# Patient Record
Sex: Male | Born: 1935
Health system: Southern US, Community
[De-identification: ages and names within clinical notes are randomized; demographics above are authoritative.]

## PROBLEM LIST (undated history)

## (undated) DIAGNOSIS — R0600 Dyspnea, unspecified: Secondary | ICD-10-CM

## (undated) DIAGNOSIS — E785 Hyperlipidemia, unspecified: Secondary | ICD-10-CM

## (undated) DIAGNOSIS — M1990 Unspecified osteoarthritis, unspecified site: Secondary | ICD-10-CM

## (undated) DIAGNOSIS — G629 Polyneuropathy, unspecified: Secondary | ICD-10-CM

## (undated) DIAGNOSIS — I2510 Atherosclerotic heart disease of native coronary artery without angina pectoris: Secondary | ICD-10-CM

## (undated) DIAGNOSIS — E039 Hypothyroidism, unspecified: Secondary | ICD-10-CM

## (undated) DIAGNOSIS — K219 Gastro-esophageal reflux disease without esophagitis: Secondary | ICD-10-CM

## (undated) DIAGNOSIS — N200 Calculus of kidney: Secondary | ICD-10-CM

## (undated) DIAGNOSIS — K635 Polyp of colon: Secondary | ICD-10-CM

## (undated) DIAGNOSIS — I10 Essential (primary) hypertension: Secondary | ICD-10-CM

## (undated) DIAGNOSIS — I739 Peripheral vascular disease, unspecified: Secondary | ICD-10-CM

## (undated) HISTORY — DX: Hyperlipidemia, unspecified: E78.5

## (undated) HISTORY — DX: Calculus of kidney: N20.0

## (undated) HISTORY — DX: Atherosclerotic heart disease of native coronary artery without angina pectoris: I25.10

## (undated) HISTORY — DX: Polyneuropathy, unspecified: G62.9

## (undated) HISTORY — DX: Essential (primary) hypertension: I10

## (undated) HISTORY — DX: Polyp of colon: K63.5

---

## 1982-12-22 HISTORY — PX: APPENDECTOMY: SHX54

## 1987-12-23 HISTORY — PX: CHOLECYSTECTOMY: SHX55

## 2001-04-21 HISTORY — PX: CATARACT EXTRACTION: SUR2

## 2001-12-22 HISTORY — PX: RETINAL DETACHMENT SURGERY: SHX105

## 2004-11-11 ENCOUNTER — Ambulatory Visit: Payer: Self-pay | Admitting: Family Medicine

## 2004-12-13 ENCOUNTER — Ambulatory Visit: Payer: Self-pay | Admitting: Family Medicine

## 2004-12-22 HISTORY — PX: CORONARY ANGIOPLASTY: SHX604

## 2005-01-20 ENCOUNTER — Ambulatory Visit: Payer: Self-pay | Admitting: Family Medicine

## 2005-02-11 ENCOUNTER — Ambulatory Visit: Payer: Self-pay | Admitting: Family Medicine

## 2005-03-25 ENCOUNTER — Ambulatory Visit: Payer: Self-pay | Admitting: Family Medicine

## 2005-05-14 ENCOUNTER — Inpatient Hospital Stay: Payer: Self-pay | Admitting: Internal Medicine

## 2005-05-14 ENCOUNTER — Other Ambulatory Visit: Payer: Self-pay

## 2005-05-20 ENCOUNTER — Ambulatory Visit: Payer: Self-pay | Admitting: Family Medicine

## 2005-08-18 ENCOUNTER — Ambulatory Visit: Payer: Self-pay | Admitting: Family Medicine

## 2005-10-08 ENCOUNTER — Ambulatory Visit: Payer: Self-pay | Admitting: Family Medicine

## 2006-02-18 ENCOUNTER — Ambulatory Visit: Payer: Self-pay | Admitting: Family Medicine

## 2006-02-23 ENCOUNTER — Ambulatory Visit: Payer: Self-pay | Admitting: Family Medicine

## 2006-05-05 ENCOUNTER — Ambulatory Visit: Payer: Self-pay | Admitting: Family Medicine

## 2006-05-19 ENCOUNTER — Ambulatory Visit: Payer: Self-pay | Admitting: Family Medicine

## 2006-07-06 ENCOUNTER — Ambulatory Visit: Payer: Self-pay | Admitting: Family Medicine

## 2006-09-29 ENCOUNTER — Ambulatory Visit: Payer: Self-pay | Admitting: Family Medicine

## 2006-10-05 ENCOUNTER — Ambulatory Visit: Payer: Self-pay | Admitting: Family Medicine

## 2007-01-05 ENCOUNTER — Ambulatory Visit: Payer: Self-pay | Admitting: Family Medicine

## 2007-01-05 LAB — CONVERTED CEMR LAB
Albumin: 4 g/dL (ref 3.5–5.2)
BUN: 20 mg/dL (ref 6–23)
GFR calc Af Amer: 75 mL/min
GFR calc non Af Amer: 62 mL/min
Hgb A1c MFr Bld: 7.2 % — ABNORMAL HIGH (ref 4.6–6.0)
Phosphorus: 3.6 mg/dL (ref 2.3–4.6)
Potassium: 4.5 meq/L (ref 3.5–5.1)
Sodium: 141 meq/L (ref 135–145)

## 2007-01-20 ENCOUNTER — Ambulatory Visit: Payer: Self-pay | Admitting: Family Medicine

## 2007-04-15 ENCOUNTER — Encounter: Payer: Self-pay | Admitting: Family Medicine

## 2007-04-15 DIAGNOSIS — E1169 Type 2 diabetes mellitus with other specified complication: Secondary | ICD-10-CM | POA: Insufficient documentation

## 2007-04-15 DIAGNOSIS — E1122 Type 2 diabetes mellitus with diabetic chronic kidney disease: Secondary | ICD-10-CM

## 2007-04-15 DIAGNOSIS — L570 Actinic keratosis: Secondary | ICD-10-CM

## 2007-04-15 DIAGNOSIS — I252 Old myocardial infarction: Secondary | ICD-10-CM

## 2007-04-15 DIAGNOSIS — I10 Essential (primary) hypertension: Secondary | ICD-10-CM | POA: Insufficient documentation

## 2007-04-15 DIAGNOSIS — E785 Hyperlipidemia, unspecified: Secondary | ICD-10-CM | POA: Insufficient documentation

## 2007-04-18 DIAGNOSIS — I2510 Atherosclerotic heart disease of native coronary artery without angina pectoris: Secondary | ICD-10-CM | POA: Insufficient documentation

## 2007-07-01 ENCOUNTER — Ambulatory Visit: Payer: Self-pay | Admitting: Family Medicine

## 2007-07-02 LAB — CONVERTED CEMR LAB
Albumin: 3.6 g/dL (ref 3.5–5.2)
Creatinine, Ser: 1.1 mg/dL (ref 0.4–1.5)
GFR calc Af Amer: 83 mL/min
GFR calc non Af Amer: 69 mL/min
LDL Cholesterol: 52 mg/dL (ref 0–99)
Phosphorus: 2.5 mg/dL (ref 2.3–4.6)
Potassium: 4.7 meq/L (ref 3.5–5.1)
Sodium: 140 meq/L (ref 135–145)
Total CHOL/HDL Ratio: 2.7
Triglycerides: 55 mg/dL (ref 0–149)
VLDL: 11 mg/dL (ref 0–40)

## 2007-07-06 ENCOUNTER — Encounter: Payer: Self-pay | Admitting: Family Medicine

## 2007-07-27 ENCOUNTER — Telehealth (INDEPENDENT_AMBULATORY_CARE_PROVIDER_SITE_OTHER): Payer: Self-pay | Admitting: *Deleted

## 2007-08-06 ENCOUNTER — Encounter: Payer: Self-pay | Admitting: Family Medicine

## 2007-10-01 ENCOUNTER — Ambulatory Visit: Payer: Self-pay | Admitting: Family Medicine

## 2007-10-25 ENCOUNTER — Encounter: Payer: Self-pay | Admitting: Family Medicine

## 2007-12-02 ENCOUNTER — Encounter: Payer: Self-pay | Admitting: Family Medicine

## 2007-12-02 ENCOUNTER — Ambulatory Visit: Payer: Medicare Other | Admitting: Gastroenterology

## 2007-12-28 ENCOUNTER — Ambulatory Visit: Payer: Self-pay | Admitting: Family Medicine

## 2007-12-29 ENCOUNTER — Telehealth: Payer: Self-pay | Admitting: Family Medicine

## 2007-12-31 LAB — CONVERTED CEMR LAB
Albumin: 3.8 g/dL (ref 3.5–5.2)
Basophils Absolute: 0 10*3/uL (ref 0.0–0.1)
Calcium: 9.4 mg/dL (ref 8.4–10.5)
Chloride: 108 meq/L (ref 96–112)
Cholesterol: 104 mg/dL (ref 0–200)
GFR calc Af Amer: 83 mL/min
GFR calc non Af Amer: 69 mL/min
Glucose, Bld: 84 mg/dL (ref 70–99)
HCT: 37.2 % — ABNORMAL LOW (ref 39.0–52.0)
LDL Cholesterol: 49 mg/dL (ref 0–99)
MCHC: 34.5 g/dL (ref 30.0–36.0)
Microalb Creat Ratio: 36.6 mg/g — ABNORMAL HIGH (ref 0.0–30.0)
Microalb, Ur: 3.6 mg/dL — ABNORMAL HIGH (ref 0.0–1.9)
Neutrophils Relative %: 74.4 % (ref 43.0–77.0)
RBC: 3.7 M/uL — ABNORMAL LOW (ref 4.22–5.81)
RDW: 12.8 % (ref 11.5–14.6)
Total CHOL/HDL Ratio: 2.8
VLDL: 17 mg/dL (ref 0–40)

## 2008-03-01 ENCOUNTER — Ambulatory Visit: Payer: Self-pay | Admitting: Family Medicine

## 2008-03-03 ENCOUNTER — Ambulatory Visit: Payer: Self-pay | Admitting: Family Medicine

## 2008-03-29 ENCOUNTER — Ambulatory Visit: Payer: Self-pay | Admitting: Family Medicine

## 2008-04-03 LAB — CONVERTED CEMR LAB: Hgb A1c MFr Bld: 7.2 % — ABNORMAL HIGH (ref 4.6–6.0)

## 2008-04-10 ENCOUNTER — Encounter (INDEPENDENT_AMBULATORY_CARE_PROVIDER_SITE_OTHER): Payer: Self-pay | Admitting: *Deleted

## 2008-05-30 ENCOUNTER — Ambulatory Visit: Payer: Self-pay | Admitting: Family Medicine

## 2008-08-30 ENCOUNTER — Ambulatory Visit: Payer: Self-pay | Admitting: Family Medicine

## 2008-08-31 LAB — CONVERTED CEMR LAB
CO2: 30 meq/L (ref 19–32)
Chloride: 115 meq/L — ABNORMAL HIGH (ref 96–112)
GFR calc Af Amer: 75 mL/min
GFR calc non Af Amer: 62 mL/min
Potassium: 4.5 meq/L (ref 3.5–5.1)
Sodium: 145 meq/L (ref 135–145)

## 2008-09-04 ENCOUNTER — Ambulatory Visit: Payer: Self-pay | Admitting: Family Medicine

## 2008-10-02 ENCOUNTER — Ambulatory Visit: Payer: Self-pay | Admitting: Family Medicine

## 2008-10-18 ENCOUNTER — Encounter: Payer: Self-pay | Admitting: Family Medicine

## 2008-10-18 ENCOUNTER — Ambulatory Visit: Payer: Medicare Other | Admitting: Urology

## 2008-10-24 ENCOUNTER — Encounter: Payer: Self-pay | Admitting: Family Medicine

## 2008-10-24 ENCOUNTER — Ambulatory Visit: Payer: Medicare Other | Admitting: Urology

## 2008-11-14 ENCOUNTER — Telehealth: Payer: Self-pay | Admitting: Family Medicine

## 2008-12-05 ENCOUNTER — Ambulatory Visit: Payer: Self-pay | Admitting: Family Medicine

## 2008-12-06 LAB — CONVERTED CEMR LAB
ALT: 20 units/L (ref 0–53)
AST: 16 units/L (ref 0–37)
BUN: 22 mg/dL (ref 6–23)
CO2: 30 meq/L (ref 19–32)
Chloride: 108 meq/L (ref 96–112)
Cholesterol: 104 mg/dL (ref 0–200)
Creatinine, Ser: 1.1 mg/dL (ref 0.4–1.5)
GFR calc Af Amer: 83 mL/min
Hgb A1c MFr Bld: 6.6 % — ABNORMAL HIGH (ref 4.6–6.0)
Total CHOL/HDL Ratio: 2.9
Triglycerides: 79 mg/dL (ref 0–149)

## 2008-12-11 ENCOUNTER — Telehealth (INDEPENDENT_AMBULATORY_CARE_PROVIDER_SITE_OTHER): Payer: Self-pay | Admitting: *Deleted

## 2008-12-28 ENCOUNTER — Ambulatory Visit: Payer: Self-pay | Admitting: Family Medicine

## 2009-01-05 ENCOUNTER — Encounter: Payer: Self-pay | Admitting: Family Medicine

## 2009-05-28 ENCOUNTER — Encounter: Payer: Self-pay | Admitting: Family Medicine

## 2009-06-26 ENCOUNTER — Ambulatory Visit: Payer: Self-pay | Admitting: Family Medicine

## 2009-06-27 LAB — CONVERTED CEMR LAB
ALT: 21 units/L (ref 0–53)
Basophils Relative: 0.5 % (ref 0.0–3.0)
CO2: 31 meq/L (ref 19–32)
Calcium: 9.4 mg/dL (ref 8.4–10.5)
Chloride: 109 meq/L (ref 96–112)
Creatinine, Ser: 1.3 mg/dL (ref 0.4–1.5)
Creatinine,U: 78.1 mg/dL
Eosinophils Relative: 7.5 % — ABNORMAL HIGH (ref 0.0–5.0)
HCT: 38.1 % — ABNORMAL LOW (ref 39.0–52.0)
HDL: 41.2 mg/dL (ref >39.00)
Hemoglobin: 12.9 g/dL — ABNORMAL LOW (ref 13.0–17.0)
Lymphs Abs: 1.6 10*3/uL (ref 0.7–4.0)
Microalb Creat Ratio: 14.1 mg/g (ref 0.0–30.0)
Monocytes Relative: 7.7 % (ref 3.0–12.0)
Neutro Abs: 4 10*3/uL (ref 1.4–7.7)
RBC: 3.76 M/uL — ABNORMAL LOW (ref 4.22–5.81)
RDW: 12.3 % (ref 11.5–14.6)
Sodium: 146 meq/L — ABNORMAL HIGH (ref 135–145)
WBC: 6.6 10*3/uL (ref 4.5–10.5)

## 2009-06-29 LAB — CONVERTED CEMR LAB: Hgb A1c MFr Bld: 6.7 % — ABNORMAL HIGH (ref 4.6–6.5)

## 2009-07-02 ENCOUNTER — Ambulatory Visit: Payer: Self-pay | Admitting: Family Medicine

## 2009-07-24 ENCOUNTER — Telehealth: Payer: Self-pay | Admitting: Family Medicine

## 2009-07-27 ENCOUNTER — Telehealth: Payer: Self-pay | Admitting: Family Medicine

## 2009-07-31 ENCOUNTER — Encounter: Payer: Self-pay | Admitting: Family Medicine

## 2009-08-06 ENCOUNTER — Encounter: Payer: Self-pay | Admitting: Family Medicine

## 2009-08-31 ENCOUNTER — Encounter: Payer: Self-pay | Admitting: Family Medicine

## 2009-09-27 ENCOUNTER — Ambulatory Visit: Payer: Self-pay | Admitting: Family Medicine

## 2009-10-02 LAB — CONVERTED CEMR LAB
ALT: 17 units/L (ref 0–53)
AST: 15 units/L (ref 0–37)
HDL: 37 mg/dL — ABNORMAL LOW (ref >39.00)
LDL Cholesterol: 48 mg/dL (ref 0–99)
Total CHOL/HDL Ratio: 3
VLDL: 18.8 mg/dL (ref 0.0–40.0)

## 2009-11-22 ENCOUNTER — Encounter: Payer: Self-pay | Admitting: Family Medicine

## 2009-11-23 ENCOUNTER — Encounter: Payer: Self-pay | Admitting: Family Medicine

## 2010-01-04 ENCOUNTER — Ambulatory Visit: Payer: Self-pay | Admitting: Family Medicine

## 2010-01-18 ENCOUNTER — Encounter: Payer: Self-pay | Admitting: Family Medicine

## 2010-01-30 ENCOUNTER — Telehealth: Payer: Self-pay | Admitting: Family Medicine

## 2010-03-14 ENCOUNTER — Encounter: Payer: Self-pay | Admitting: Family Medicine

## 2010-05-21 ENCOUNTER — Telehealth: Payer: Self-pay | Admitting: Family Medicine

## 2010-06-18 ENCOUNTER — Encounter: Payer: Self-pay | Admitting: Family Medicine

## 2010-07-03 ENCOUNTER — Ambulatory Visit: Payer: Self-pay | Admitting: Family Medicine

## 2010-07-03 LAB — CONVERTED CEMR LAB
AST: 18 units/L (ref 0–37)
Chloride: 108 meq/L (ref 96–112)
Cholesterol: 120 mg/dL (ref 0–200)
GFR calc non Af Amer: 66.71 mL/min (ref >60)
Glucose, Bld: 176 mg/dL — ABNORMAL HIGH (ref 70–99)
Hgb A1c MFr Bld: 7.4 % — ABNORMAL HIGH (ref 4.6–6.5)
LDL Cholesterol: 65 mg/dL (ref 0–99)
Phosphorus: 2.7 mg/dL (ref 2.3–4.6)
Potassium: 4.8 meq/L (ref 3.5–5.1)
Sodium: 142 meq/L (ref 135–145)
Total CHOL/HDL Ratio: 3
VLDL: 14.8 mg/dL (ref 0.0–40.0)

## 2010-07-08 ENCOUNTER — Ambulatory Visit: Payer: Self-pay | Admitting: Family Medicine

## 2010-08-29 ENCOUNTER — Ambulatory Visit: Payer: Medicare Other | Admitting: Unknown Physician Specialty

## 2010-09-16 ENCOUNTER — Encounter: Payer: Self-pay | Admitting: Family Medicine

## 2010-09-16 LAB — HM DIABETES EYE EXAM: HM Diabetic Eye Exam: NORMAL

## 2010-10-04 ENCOUNTER — Ambulatory Visit: Payer: Self-pay | Admitting: Family Medicine

## 2010-10-06 LAB — CONVERTED CEMR LAB
Albumin: 4 g/dL (ref 3.5–5.2)
BUN: 22 mg/dL (ref 6–23)
Chloride: 108 meq/L (ref 96–112)
Creatinine, Ser: 1.2 mg/dL (ref 0.4–1.5)
GFR calc non Af Amer: 62.78 mL/min (ref >60)
Phosphorus: 2.7 mg/dL (ref 2.3–4.6)

## 2010-10-09 ENCOUNTER — Ambulatory Visit: Payer: Self-pay | Admitting: Family Medicine

## 2010-10-23 ENCOUNTER — Ambulatory Visit: Payer: Self-pay | Admitting: Family Medicine

## 2010-10-31 ENCOUNTER — Encounter: Payer: Self-pay | Admitting: Family Medicine

## 2010-11-06 ENCOUNTER — Telehealth: Payer: Self-pay | Admitting: Family Medicine

## 2010-11-29 ENCOUNTER — Ambulatory Visit: Payer: Self-pay | Admitting: Family Medicine

## 2010-11-29 DIAGNOSIS — K219 Gastro-esophageal reflux disease without esophagitis: Secondary | ICD-10-CM | POA: Insufficient documentation

## 2010-12-03 ENCOUNTER — Telehealth: Payer: Self-pay | Admitting: Family Medicine

## 2010-12-10 ENCOUNTER — Encounter: Payer: Self-pay | Admitting: Family Medicine

## 2010-12-17 ENCOUNTER — Encounter: Payer: Self-pay | Admitting: Family Medicine

## 2010-12-19 ENCOUNTER — Encounter: Payer: Self-pay | Admitting: Family Medicine

## 2010-12-22 HISTORY — PX: SHOULDER SURGERY: SHX246

## 2010-12-24 ENCOUNTER — Encounter: Payer: Self-pay | Admitting: Family Medicine

## 2010-12-25 ENCOUNTER — Ambulatory Visit
Admission: RE | Admit: 2010-12-25 | Discharge: 2010-12-25 | Payer: Self-pay | Source: Home / Self Care | Attending: Family Medicine | Admitting: Family Medicine

## 2010-12-31 ENCOUNTER — Ambulatory Visit: Payer: Medicare Other | Admitting: Unknown Physician Specialty

## 2011-01-06 ENCOUNTER — Ambulatory Visit: Payer: Medicare Other | Admitting: Unknown Physician Specialty

## 2011-01-08 ENCOUNTER — Ambulatory Visit: Admit: 2011-01-08 | Payer: Self-pay | Admitting: Family Medicine

## 2011-01-14 ENCOUNTER — Ambulatory Visit: Admit: 2011-01-14 | Payer: Self-pay | Admitting: Family Medicine

## 2011-01-21 NOTE — Letter (Signed)
Summary: [facility]  [facility]   Imported By: Maryln Gottron 11/15/2010 11:23:20  _____________________________________________________________________  External Attachment:    Type:   Image     Comment:   External Document

## 2011-01-21 NOTE — Letter (Signed)
Summary: [facility]   Imported By: Lanelle Bal 01/24/2010 08:31:45  _____________________________________________________________________  External Attachment:    Type:   Image     Comment:   External Document

## 2011-01-21 NOTE — Progress Notes (Signed)
Summary: Rx Omeprazole  Phone Note Call from Patient   Summary of Call: needs px for prilosec -- is too expensive otc  uses [facility] [address]   Follow-up for Phone Call        px written on EMR for call in  Follow-up by: Judith Part MD,  January 30, 2010 10:12 AM  Additional Follow-up for Phone Call Additional follow up Details #1::        Rx Called In, patient notified.  Additional Follow-up by: Linde Gillis CMA Duncan Dull),  January 30, 2010 12:57 PM    New/Updated Medications: OMEPRAZOLE 20 MG CPDR (OMEPRAZOLE) 1 by mouth once daily Prescriptions: OMEPRAZOLE 20 MG CPDR (OMEPRAZOLE) 1 by mouth once daily  #30 x 11   Entered and Authorized by:   Judith Part MD   Signed by:   Judith Part MD on 01/30/2010   Method used:   Telephoned to ...       [facility] [address] (retail)       [address]       [place], [place]  [address]       Ph: [phone]       Fax: [phone]   RxID:   [id number]

## 2011-01-21 NOTE — Assessment & Plan Note (Signed)
Summary: David Henry 2 WEEK NURSE VISIT RECHECK BLOOD PRESSURE/[facility]  Nurse Visit   Vital Signs:  Patient profile:   76 year old male Height:      68 inches Weight:      146.25 pounds BMI:     22.32 Temp:     97.9 degrees F oral Pulse rate:   64 / minute Pulse rhythm:   regular BP sitting:   150 / 66  (left arm) Cuff size:   regular CC: Patient present today for blood pressure check at the request of the physician. Comments relevant Hx: Patient present today for BP check. Pt has been compliant taking his BP med; tenex 1mg  taking one at bedtime and Lisinopril 20mg  taking one daily. Pt's only complaint is h/a on and off. No h/a this AM. [facility] uses [facility] [address] as pharmacy if pharmacy is needed. Pt can be reached at Home [phone] or pt's cell [phone]. Please advise.   I want him to increase lisinopril to 40 mg daily-- px written on EMR for call in - and update me if any side eff or problems f/u with me in about 4-6 weeks please  Patient's wife notified as instructed by telephone. Medication phoned to  [facility] as instructed. Appt scheduled with Dr Milinda Antis on 11/29/10 at 12noon.Lewanda Rife LPN  October 24, 2010 8:45 AM   Allergies: 1)  ! Zithromax Z-Pak (Azithromycin)  Orders Added: 1)  Est. Patient Level I [29528] Prescriptions: LISINOPRIL 40 MG TABS (LISINOPRIL) 1 by mouth once daily  #30 x 11   Entered and Authorized by:   Judith Part MD   Signed by:   Lewanda Rife LPN on 41/32/4401   Method used:   Telephoned to ...       [facility] [address] (retail)       [address]       [place], [place]  [address]       Ph: [phone]       Fax: [phone]   RxID:   [id number]

## 2011-01-21 NOTE — Consult Note (Signed)
Summary: [facility]   Imported By: Sherian Rein 09/26/2010 15:16:53  _____________________________________________________________________  External Attachment:    Type:   Image     Comment:   External Document  Appended Document: [facility]    Clinical Lists Changes  Observations: Added new observation of DMEYEEXAMNXT: 09/2011 (09/26/2010 21:00) Added new observation of DMEYEEXMRES: normal (09/16/2010 21:00) Added new observation of EYE EXAM BY: Dr Inez Pilgrim (09/16/2010 21:00) Added new observation of DIAB EYE EX: normal (09/16/2010 21:00)        Diabetes Management Exam:    Eye Exam:       Eye Exam done elsewhere          Date: 09/16/2010          Results: normal          Done by: Dr Inez Pilgrim

## 2011-01-21 NOTE — Medication Information (Signed)
Summary: Diabetic Supplies/Medical & Diabetic Supplies  Diabetic Supplies/Medical & Diabetic Supplies   Imported By: Lanelle Bal 03/19/2010 13:20:37  _____________________________________________________________________  External Attachment:    Type:   Image     Comment:   External Document
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
## 2011-01-21 NOTE — Progress Notes (Signed)
Summary: form for diabetic supplies  Phone Note Other Incoming   Caller: [facility] Summary of Call: Form for diabetic supplies is on your shelf.  I confirmed with pt that he does agree with this.                  Lowella Petties CMA  May 21, 2010 2:45 PM   Follow-up for Phone Call        form done and in nurse in box  Follow-up by: Judith Part MD,  May 21, 2010 4:22 PM  Additional Follow-up for Phone Call Additional follow up Details #1::        completed form faxed to [phone] as instructed. Form is at Rena's desk if needed.Lewanda Rife LPN  May 21, 2010 4:50 PM

## 2011-01-21 NOTE — Letter (Signed)
Summary: [facility]  [facility]   Imported By: Lanelle Bal 07/24/2010 10:45:28  _____________________________________________________________________  External Attachment:    Type:   Image     Comment:   External Document

## 2011-01-21 NOTE — Progress Notes (Signed)
Summary: needs medical clearance  Phone Note From Other Clinic   Caller: Velna Hatchet, Dr. Gavin Potters   [phone] Summary of Call: Pt will be having left shoulder rotater cuff surgery and Dr. [place] office needs medical clearance from you.  Does pt need appt?  If not, please fax note to them at [phone]. Initial call taken by: Lowella Petties CMA, AAMA,  November 06, 2010 4:04 PM  Follow-up for Phone Call        I do need him to follow up with me -- need to make sure bp is ok before surgery and do ekg etc Follow-up by: Judith Part MD,  November 06, 2010 4:48 PM  Additional Follow-up for Phone Call Additional follow up Details #1::        I spoke with [name] at [facility]. P Dr. Gavin Potters is out of office for a month and [name] suggested scheduling surgical clearance appt the first of Jan. Patient notified as instructed by telephone. Pt scheduled surgical clearance 30 min. appt for 12/25/10 at 11:30am. Pt will call back to reschedule if this does not coinside with his surgical appt.Lewanda Rife LPN  November 07, 2010 9:47 AM

## 2011-01-22 ENCOUNTER — Encounter: Payer: Self-pay | Admitting: Family Medicine

## 2011-01-23 NOTE — Letter (Signed)
Summary: [facility]  [facility]   Imported By: Maryln Gottron 01/02/2011 12:29:25  _____________________________________________________________________  External Attachment:    Type:   Image     Comment:   External Document

## 2011-01-23 NOTE — Progress Notes (Signed)
Summary: reporting BP readings  Phone Note Call from Patient Call back at Home Phone [phone] Call back at [phone]   Caller: Patient Call For: Judith Part MD Summary of Call: Pt brought in BP readings - 140/73, 125/72, 123/72, 142/76, 139/80, 161/81, 150/86. Initial call taken by: Lowella Petties CMA, AAMA,  December 03, 2010 12:52 PM  Follow-up for Phone Call        I think these are improving - stay off the celebrex f/u as planned  Follow-up by: Judith Part MD,  December 03, 2010 1:54 PM  Additional Follow-up for Phone Call Additional follow up Details #1::        Patient notified as instructed by telephone. Lewanda Rife LPN  December 03, 2010 2:43 PM
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
## 2011-01-23 NOTE — Consult Note (Signed)
Summary: [facility]  [facility]   Imported By: Lanelle Bal 12/24/2010 08:51:19  _____________________________________________________________________  External Attachment:    Type:   Image     Comment:   External Document
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
## 2011-02-06 NOTE — Letter (Signed)
Summary: [facility]   Imported By: Kassie Mends 01/29/2011 10:35:32  _____________________________________________________________________  External Attachment:    Type:   Image     Comment:   External Document

## 2011-02-10 ENCOUNTER — Other Ambulatory Visit: Payer: Self-pay | Admitting: Family Medicine

## 2011-02-10 ENCOUNTER — Other Ambulatory Visit (INDEPENDENT_AMBULATORY_CARE_PROVIDER_SITE_OTHER): Payer: Medicare Other

## 2011-02-10 ENCOUNTER — Encounter (INDEPENDENT_AMBULATORY_CARE_PROVIDER_SITE_OTHER): Payer: Self-pay | Admitting: *Deleted

## 2011-02-10 DIAGNOSIS — E785 Hyperlipidemia, unspecified: Secondary | ICD-10-CM

## 2011-02-10 DIAGNOSIS — E119 Type 2 diabetes mellitus without complications: Secondary | ICD-10-CM

## 2011-02-10 DIAGNOSIS — I10 Essential (primary) hypertension: Secondary | ICD-10-CM

## 2011-02-10 LAB — LIPID PANEL
Cholesterol: 105 mg/dL (ref 0–200)
Triglycerides: 68 mg/dL (ref 0.0–149.0)
VLDL: 13.6 mg/dL (ref 0.0–40.0)

## 2011-02-10 LAB — RENAL FUNCTION PANEL
CO2: 28 mEq/L (ref 19–32)
Calcium: 9.5 mg/dL (ref 8.4–10.5)
Chloride: 108 mEq/L (ref 96–112)
GFR: 62.72 mL/min (ref >60.00)
Sodium: 142 mEq/L (ref 135–145)

## 2011-02-10 LAB — AST: AST: 20 U/L (ref 0–37)

## 2011-02-13 ENCOUNTER — Ambulatory Visit: Payer: Self-pay | Admitting: Family Medicine

## 2011-02-17 ENCOUNTER — Ambulatory Visit (INDEPENDENT_AMBULATORY_CARE_PROVIDER_SITE_OTHER): Payer: Medicare Other | Admitting: Family Medicine

## 2011-02-17 ENCOUNTER — Encounter: Payer: Self-pay | Admitting: Family Medicine

## 2011-02-17 DIAGNOSIS — I10 Essential (primary) hypertension: Secondary | ICD-10-CM

## 2011-02-17 DIAGNOSIS — K219 Gastro-esophageal reflux disease without esophagitis: Secondary | ICD-10-CM

## 2011-02-17 DIAGNOSIS — E785 Hyperlipidemia, unspecified: Secondary | ICD-10-CM

## 2011-02-17 DIAGNOSIS — E119 Type 2 diabetes mellitus without complications: Secondary | ICD-10-CM

## 2011-02-17 LAB — HM DIABETES FOOT EXAM

## 2011-02-21 ENCOUNTER — Telehealth: Payer: Self-pay | Admitting: Family Medicine
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
## 2011-02-27 NOTE — Progress Notes (Signed)
Summary: wants rx for prilosec otc   Phone Note Call from Patient Call back at Home Phone [phone]   Caller: Patient Call For: Judith Part MD Summary of Call: Patient went to pick up rx for omeprazole and it was going to cost him $30 for 90 pills. He is asking if he could get a rx for Prilosec OTC, 85 tablets  called in to [facility] [address] because with his ins he can get this for $10. Initial call taken by: Melody Comas,  February 21, 2011 10:12 AM  Follow-up for Phone Call        px written on EMR for call in  Follow-up by: Judith Part MD,  February 21, 2011 11:30 AM    New/Updated Medications: PRILOSEC OTC 20 MG TBEC (OMEPRAZOLE MAGNESIUM) 1 by mouth once daily Prescriptions: PRILOSEC OTC 20 MG TBEC (OMEPRAZOLE MAGNESIUM) 1 by mouth once daily  #85 x 3   Entered by:   Delilah Shan CMA (AAMA)   Authorized by:   Judith Part MD   Signed by:   Delilah Shan CMA (AAMA) on 02/21/2011   Method used:   Electronically to        [facility] [address] (retail)       [address], [address]       [place], [place]  [address]       Ph: [phone]       Fax: [phone]   RxID:   [id number] PRILOSEC OTC 20 MG TBEC (OMEPRAZOLE MAGNESIUM) 1 by mouth once daily  #85 x 3   Entered and Authorized by:   Judith Part MD   Signed by:   Judith Part MD on 02/21/2011   Method used:   Telephoned to ...       [facility] [address] (retail)       [address]       [place], [place]  [address]       Ph: [phone]       Fax: [phone]   RxID:   [id number]

## 2011-03-12 ENCOUNTER — Telehealth: Payer: Self-pay | Admitting: *Deleted

## 2011-03-12 NOTE — Telephone Encounter (Signed)
Form done In IN box Please copy to scan thanks

## 2011-03-12 NOTE — Telephone Encounter (Signed)
Completed form faxed to [phone] as instructed. Form sent for scanning.

## 2011-03-12 NOTE — Telephone Encounter (Signed)
Form for diabetic supplies is on your shelf.  I spoke to the patient and he does get his supplies from this company.

## 2011-04-06 ENCOUNTER — Other Ambulatory Visit: Payer: Self-pay | Admitting: Family Medicine

## 2011-05-10 ENCOUNTER — Encounter: Payer: Self-pay | Admitting: Family Medicine

## 2011-05-13 ENCOUNTER — Other Ambulatory Visit (INDEPENDENT_AMBULATORY_CARE_PROVIDER_SITE_OTHER): Payer: Medicare Other | Admitting: Family Medicine

## 2011-05-13 DIAGNOSIS — E119 Type 2 diabetes mellitus without complications: Secondary | ICD-10-CM

## 2011-05-13 DIAGNOSIS — K219 Gastro-esophageal reflux disease without esophagitis: Secondary | ICD-10-CM

## 2011-05-13 DIAGNOSIS — Z125 Encounter for screening for malignant neoplasm of prostate: Secondary | ICD-10-CM

## 2011-05-13 DIAGNOSIS — I10 Essential (primary) hypertension: Secondary | ICD-10-CM

## 2011-05-13 DIAGNOSIS — E7800 Pure hypercholesterolemia, unspecified: Secondary | ICD-10-CM

## 2011-05-13 LAB — BASIC METABOLIC PANEL
BUN: 24 mg/dL — ABNORMAL HIGH (ref 6–23)
Calcium: 9.6 mg/dL (ref 8.4–10.5)
GFR: 60.32 mL/min (ref >60.00)
Potassium: 5 mEq/L (ref 3.5–5.1)
Sodium: 143 mEq/L (ref 135–145)

## 2011-05-13 LAB — CBC WITH DIFFERENTIAL/PLATELET
Basophils Relative: 0.3 % (ref 0.0–3.0)
Hemoglobin: 12.1 g/dL — ABNORMAL LOW (ref 13.0–17.0)
Lymphocytes Relative: 22.6 % (ref 12.0–46.0)
Monocytes Relative: 8.8 % (ref 3.0–12.0)
Neutro Abs: 3.4 10*3/uL (ref 1.4–7.7)
Neutrophils Relative %: 62.7 % (ref 43.0–77.0)
RBC: 3.56 Mil/uL — ABNORMAL LOW (ref 4.22–5.81)
WBC: 5.5 10*3/uL (ref 4.5–10.5)

## 2011-05-13 LAB — PSA, MEDICARE: PSA: 2.09 ng/mL (ref <=4.00)

## 2011-05-13 LAB — LIPID PANEL
Cholesterol: 101 mg/dL (ref 0–200)
HDL: 41.7 mg/dL (ref >39.00)
VLDL: 19.6 mg/dL (ref 0.0–40.0)

## 2011-05-13 LAB — HEPATIC FUNCTION PANEL
ALT: 19 U/L (ref 0–53)
AST: 17 U/L (ref 0–37)
Bilirubin, Direct: 0.1 mg/dL (ref 0.0–0.3)
Total Protein: 6.2 g/dL (ref 6.0–8.3)

## 2011-05-13 LAB — MICROALBUMIN / CREATININE URINE RATIO
Creatinine,U: 101.4 mg/dL
Microalb Creat Ratio: 2.5 mg/g (ref 0.0–30.0)

## 2011-05-13 LAB — HEMOGLOBIN A1C: Hgb A1c MFr Bld: 7.4 % — ABNORMAL HIGH (ref 4.6–6.5)

## 2011-05-20 ENCOUNTER — Ambulatory Visit (INDEPENDENT_AMBULATORY_CARE_PROVIDER_SITE_OTHER): Payer: Medicare Other | Admitting: Family Medicine

## 2011-05-20 ENCOUNTER — Encounter: Payer: Self-pay | Admitting: Family Medicine

## 2011-05-20 DIAGNOSIS — Z23 Encounter for immunization: Secondary | ICD-10-CM

## 2011-05-20 DIAGNOSIS — E119 Type 2 diabetes mellitus without complications: Secondary | ICD-10-CM

## 2011-05-20 DIAGNOSIS — E785 Hyperlipidemia, unspecified: Secondary | ICD-10-CM

## 2011-05-20 DIAGNOSIS — I10 Essential (primary) hypertension: Secondary | ICD-10-CM

## 2011-05-20 DIAGNOSIS — K219 Gastro-esophageal reflux disease without esophagitis: Secondary | ICD-10-CM

## 2011-05-20 MED ORDER — METFORMIN HCL 500 MG PO TABS: 1000.0000 mg | ORAL_TABLET | Freq: Two times a day (BID) | ORAL | Status: DC

## 2011-05-20 NOTE — Assessment & Plan Note (Signed)
This is up a bit on celebrex- will be stopping it soon and re check bp

## 2011-05-20 NOTE — Assessment & Plan Note (Signed)
Much imp back on prilosec - will continue that and update me if symptoms return

## 2011-05-20 NOTE — Patient Instructions (Signed)
Check some sugars 2 hours after lunch Other days 2 hours after supper Send me a log of sugars in about 2 weeks Watch diet  Try to exercise daily-- if not outdoors- use your exercise machine without the arms (just pedal)  Or trade it in for a recumbant exercise bike  tetnus shot today  Schedule follow up with labs prior in about 3 months

## 2011-05-20 NOTE — Progress Notes (Signed)
Subjective:    Patient ID: David Henry, male    DOB: 1926/04/02, 77 y.o.   MRN: [id number]  HPI Here for f/u of DM and lipids and HTN   a1c is up to 7.4 Had just had shoulder surgery at last follow up  (very slow improvement ) Otherwise feeling good  No missed doses  On top dose of amaryl and metformin  In am sugars are usually 98-120 -- really good  Does not check it later  Is sticking with low sugar diet most of the time  Diet is really good  Is doing yardwork and working in flowers - depending on weather  Has an exercise machine - does bother his shoulder   Lipids on zocor 20 (dose dec for norvasc) Is still very well controlled Lab Results  Component Value Date   CHOL 101 05/13/2011   CHOL 105 02/10/2011   CHOL 120 07/03/2010   Lab Results  Component Value Date   HDL 41.70 05/13/2011   HDL 51.88 02/10/2011   HDL 41.66 07/03/2010   Lab Results  Component Value Date   LDLCALC 40 05/13/2011   LDLCALC 52 02/10/2011   LDLCALC 65 07/03/2010   Lab Results  Component Value Date   TRIG 98.0 05/13/2011   TRIG 68.0 02/10/2011   TRIG 74.0 07/03/2010   Lab Results  Component Value Date   CHOLHDL 2 05/13/2011   CHOLHDL 3 02/10/2011   CHOLHDL 3 07/03/2010   No results found for this basename: LDLDIRECT    HTN in stable with 138/64 today No headache or cp or edema  Is a bit higher than usual while on celebrex (which he cannot stop at this time)  Wt is up 2 lb  Did psa for prostate screen - fairly stable and in nl range  Last TD2000?   gerd -- got back on prilosec That is much better- no symptoms at all  Plans to not have celebrex refilled after this 3 months   Past Medical History  Diagnosis Date  . Diabetes mellitus     type II  . Hyperlipidemia   . Hypertension   . CAD (coronary artery disease)   . Colon polyp   . Kidney stones   . Neuropathy     History   Social History  . Marital Status: Married    Spouse Name: N/A    Number of Children: N/A  . Years  of Education: N/A   Occupational History  . Not on file.   Social History Main Topics  . Smoking status: Never Smoker   . Smokeless tobacco: Not on file  . Alcohol Use: No  . Drug Use:   . Sexually Active:    Other Topics Concern  . Not on file   Social History Narrative  . No narrative on file    Allergies  Allergen Reactions  . Azithromycin     REACTION: nausea      Review of Systems Review of Systems  Constitutional: Negative for fever, appetite change, fatigue and unexpected weight change.  Eyes: Negative for pain and visual disturbance.  Respiratory: Negative for cough and shortness of breath.   Cardiovascular: Negative for cp or palpitations or edema.   Gastrointestinal: Negative for nausea, diarrhea and constipation.  Genitourinary: Negative for urgency and frequency.  Skin: Negative for pallor.  MSK pos for shoulder pain post op- is improving  Neurological: Negative for weakness, light-headedness, numbness and headaches.  Hematological: Negative for adenopathy. Does not bruise/bleed easily.  Psychiatric/Behavioral: Negative for dysphoric mood. The patient is not nervous/anxious.          Objective:   Physical Exam  Constitutional: He appears well-developed and well-nourished. No distress.  HENT:  Head: Normocephalic and atraumatic.  Mouth/Throat: Oropharynx is clear and moist.  Eyes: Conjunctivae and EOM are normal. Pupils are equal, round, and reactive to light.  Neck: Normal range of motion. Neck supple. No JVD present. Carotid bruit is not present. No thyromegaly present.  Cardiovascular: Normal rate, regular rhythm, normal heart sounds and intact distal pulses.   Pulmonary/Chest: Breath sounds normal. No respiratory distress. He has no wheezes.  Abdominal: Soft. Bowel sounds are normal. He exhibits no distension, no abdominal bruit and no mass. There is no tenderness.  Musculoskeletal: He exhibits no edema and no tenderness.       Improving rom  shoulder  Lymphadenopathy:    He has no cervical adenopathy.  Neurological: He is alert. He has normal reflexes. Coordination normal.  Skin: Skin is warm and dry. No rash noted. No erythema. No pallor.  Psychiatric: He has a normal mood and affect.          Assessment & Plan:

## 2011-05-20 NOTE — Assessment & Plan Note (Addendum)
This is a bit worse despite low glycemic diet  Disc limited exercise with shoulder  Will use his exercise maching (without arms) on the days without yard work Am sugars ok  Will check 2 hour pp sugars- afternoon and eve- and mail in 2 weeks  May need injectable med or insulin ultimately  opthy up to date  microalb high - but on ace  Lab and f/u 3 mo for a1c Tdap today

## 2011-05-20 NOTE — Assessment & Plan Note (Signed)
Very good control- to goal , even with dec zocor Disc imp of low sat fat diet Rev labs with pt

## 2011-05-26 ENCOUNTER — Ambulatory Visit: Payer: Medicare Other | Admitting: Urology

## 2011-06-04 ENCOUNTER — Telehealth: Payer: Self-pay | Admitting: *Deleted

## 2011-06-04 NOTE — Telephone Encounter (Signed)
Thanks- I reviewed them All the sugars that are high are 2 hours after a meal  Some over 150-- start to log what you ate those days so we know what raised it  Keep working on diet and exercise F/u 3 mo as planned No med changes for now  Please scan his record-thanks

## 2011-06-04 NOTE — Telephone Encounter (Signed)
Patient notified as instructed by telephone. 

## 2011-06-04 NOTE — Telephone Encounter (Signed)
Patient dropped off blood sugar readings from 05-20-11 to 05-24-11. It is on your shelf.

## 2011-07-09 ENCOUNTER — Ambulatory Visit: Payer: Medicare Other | Admitting: Family Medicine

## 2011-08-15 ENCOUNTER — Other Ambulatory Visit (INDEPENDENT_AMBULATORY_CARE_PROVIDER_SITE_OTHER): Payer: Medicare Other | Admitting: Family Medicine

## 2011-08-15 DIAGNOSIS — E119 Type 2 diabetes mellitus without complications: Secondary | ICD-10-CM

## 2011-08-20 ENCOUNTER — Encounter: Payer: Self-pay | Admitting: Family Medicine

## 2011-08-20 ENCOUNTER — Ambulatory Visit (INDEPENDENT_AMBULATORY_CARE_PROVIDER_SITE_OTHER): Payer: Medicare Other | Admitting: Family Medicine

## 2011-08-20 VITALS — BP 134/62 | HR 72 | Temp 97.8°F | Ht 68.0 in | Wt 142.5 lb

## 2011-08-20 DIAGNOSIS — I10 Essential (primary) hypertension: Secondary | ICD-10-CM

## 2011-08-20 DIAGNOSIS — E785 Hyperlipidemia, unspecified: Secondary | ICD-10-CM

## 2011-08-20 DIAGNOSIS — E119 Type 2 diabetes mellitus without complications: Secondary | ICD-10-CM

## 2011-08-20 MED ORDER — ZOSTER VACCINE LIVE 19400 UNT/0.65ML ~~LOC~~ SOLR: 0.6500 mL | Freq: Once | SUBCUTANEOUS | Status: DC

## 2011-08-20 MED ORDER — GLIMEPIRIDE 4 MG PO TABS: ORAL_TABLET | ORAL | Status: DC

## 2011-08-20 NOTE — Patient Instructions (Signed)
Here is px for zoster vaccine (shingles vaccine )  You can take it to the pharmacy when you are ready to get it Decrease your amaryl to 1 pill am and 1/2 pill pm and let me know if you still get low sugars Keep up the good work with diet and exercise  Schedule fasting lab in 3 months and then follow up

## 2011-08-20 NOTE — Progress Notes (Signed)
Subjective:    Patient ID: David Henry, David Henry    DOB: 28-Nov-1926, 77 y.o.   MRN: [id number]  HPI Here for f/u of DM and HTN   Since going off celebrex his bp is down to 134/62- improved  Shoulder  Wt is down 7 lb  a1c down from 7.4 to 6.2 Sugars are much better  Is watching everything he eats  Also gets exercise working around the house  Hypoglycemia - 2 nights ago was in 50s  , occ below 70  On amaryl and metformin currently  Diet - has made a lot of changes- 1 pc of bread with sandwhiches , and eats eggs and bacon for breakfast   Patient Active Problem List  Diagnoses  . DIABETES MELLITUS, TYPE II  . HYPERLIPIDEMIA  . HYPERTENSION  . MYOCARDIAL INFARCTION, HX OF  . CORONARY ARTERY DISEASE  . ACTINIC KERATOSIS  . GERD   Past Medical History  Diagnosis Date  . Diabetes mellitus     type II  . Hyperlipidemia   . Hypertension   . CAD (coronary artery disease)   . Colon polyp   . Kidney stones   . Neuropathy    Past Surgical History  Procedure Date  . Appendectomy 1984  . Cataract extraction 04/2001  . Cholecystectomy 1989  . Retinal detachment surgery 2003  . Shoulder surgery 2012    Lt shoulder repair   History  Substance Use Topics  . Smoking status: Never Smoker   . Smokeless tobacco: Not on file  . Alcohol Use: No   Family History  Problem Relation Age of Onset  . Cancer Brother     prostate CA   Allergies  Allergen Reactions  . Azithromycin     REACTION: nausea   Current Outpatient Prescriptions on File Prior to Visit  Medication Sig Dispense Refill  . amLODipine (NORVASC) 5 MG tablet Take 5 mg by mouth daily.        Marland Kitchen aspirin 81 MG tablet Take 81 mg by mouth daily.        Marland Kitchen glucose blood test strip 1 each by Other route 2 (two) times daily as needed. Use as instructed       . guanFACINE (TENEX) 1 MG tablet TAKE ONE TABLET BY MOUTH AT BEDTIME  90 tablet  0  . lisinopril (PRINIVIL,ZESTRIL) 40 MG tablet Take 40 mg by mouth daily.        .  metFORMIN (GLUCOPHAGE) 500 MG tablet Take 2 tablets (1,000 mg total) by mouth 2 (two) times daily with a meal.  180 tablet  3  . Multiple Minerals-Vitamins (CITRACAL PLUS) TABS Take 1 tablet by mouth daily.        . Multiple Vitamin (MULTIVITAMIN) capsule Take 1 capsule by mouth daily.        . Omega-3 Fatty Acids (FISH OIL PO) Take 2 capsules by mouth daily.        Marland Kitchen omeprazole (PRILOSEC OTC) 20 MG tablet Take 20 mg by mouth daily.        . simvastatin (ZOCOR) 40 MG tablet Take 20 mg by mouth daily.        . Tamsulosin HCl (FLOMAX) 0.4 MG CAPS Take 0.4 mg by mouth daily.        Marland Kitchen acetaminophen (TYLENOL ARTHRITIS PAIN) 650 MG CR tablet Take 650 mg by mouth every 8 (eight) hours as needed.        . celecoxib (CELEBREX) 200 MG capsule Take 200 mg by  mouth daily.        Marland Kitchen HYDROcodone-acetaminophen (VICODIN) 5-500 MG per tablet Take 1 tablet by mouth every 6 (six) hours as needed.            Review of Systems Review of Systems  Constitutional: Negative for fever, appetite change, fatigue and unexpected weight change.  Eyes: Negative for pain and visual disturbance.  Respiratory: Negative for cough and shortness of breath.   Cardiovascular: Negative.  for cp or palpitations  Gastrointestinal: Negative for nausea, diarrhea and constipation.  Genitourinary: Negative for urgency and frequency.  Skin: Negative for pallor. or rash  Neurological: Negative for weakness, light-headedness, numbness and headaches.  Hematological: Negative for adenopathy. Does not bruise/bleed easily.  Psychiatric/Behavioral: Negative for dysphoric mood. The patient is not nervous/anxious.         Objective:   Physical Exam  Constitutional: He appears well-developed and well-nourished. No distress.  HENT:  Head: Normocephalic and atraumatic.  Mouth/Throat: Oropharynx is clear and moist.  Eyes: Conjunctivae and EOM are normal. Pupils are equal, round, and reactive to light.  Neck: Normal range of motion. Neck  supple. No JVD present. Carotid bruit is not present. No thyromegaly present.  Cardiovascular: Normal rate, regular rhythm, normal heart sounds and intact distal pulses.   Pulmonary/Chest: Effort normal and breath sounds normal. No respiratory distress. He has no wheezes.  Abdominal: Soft. Bowel sounds are normal. He exhibits no distension and no mass. There is no tenderness.  Musculoskeletal: Normal range of motion. He exhibits no edema.  Lymphadenopathy:    He has no cervical adenopathy.  Neurological: He is alert. He has normal reflexes. No cranial nerve deficit. Coordination normal.  Skin: Skin is warm and dry. No rash noted. No erythema. No pallor.  Psychiatric: He has a normal mood and affect.          Assessment & Plan:

## 2011-08-20 NOTE — Assessment & Plan Note (Signed)
Much improved with better diet and exercise Due to night time hypoglycemia will cut amaryl to 1/2 pill (for 2 mg) in pm and will report back a1c and f/u 3 mo

## 2011-08-20 NOTE — Assessment & Plan Note (Signed)
Improved with better habits and off nsaid F/u 3 mo for lab and visit

## 2011-09-25 ENCOUNTER — Ambulatory Visit: Payer: Medicare Other | Admitting: Ophthalmology

## 2011-09-25 DIAGNOSIS — I119 Hypertensive heart disease without heart failure: Secondary | ICD-10-CM

## 2011-09-30 ENCOUNTER — Other Ambulatory Visit: Payer: Self-pay | Admitting: *Deleted

## 2011-09-30 MED ORDER — GUANFACINE HCL 1 MG PO TABS: ORAL_TABLET | ORAL | Status: DC

## 2011-10-01 ENCOUNTER — Ambulatory Visit: Payer: Medicare Other | Admitting: Ophthalmology

## 2011-10-08 ENCOUNTER — Ambulatory Visit: Payer: Medicare Other

## 2011-10-23 ENCOUNTER — Ambulatory Visit (INDEPENDENT_AMBULATORY_CARE_PROVIDER_SITE_OTHER): Payer: Medicare Other

## 2011-10-23 DIAGNOSIS — Z23 Encounter for immunization: Secondary | ICD-10-CM

## 2011-11-03 ENCOUNTER — Other Ambulatory Visit: Payer: Self-pay | Admitting: *Deleted

## 2011-11-03 MED ORDER — LISINOPRIL 40 MG PO TABS: 40.0000 mg | ORAL_TABLET | Freq: Every day | ORAL | Status: DC

## 2011-11-03 NOTE — Telephone Encounter (Signed)
Received faxed refill request from pharmacy. Refill sent to pharmacy electronically. 

## 2011-11-17 ENCOUNTER — Other Ambulatory Visit (INDEPENDENT_AMBULATORY_CARE_PROVIDER_SITE_OTHER): Payer: Medicare Other

## 2011-11-17 DIAGNOSIS — E119 Type 2 diabetes mellitus without complications: Secondary | ICD-10-CM

## 2011-11-17 DIAGNOSIS — E785 Hyperlipidemia, unspecified: Secondary | ICD-10-CM

## 2011-11-17 DIAGNOSIS — I10 Essential (primary) hypertension: Secondary | ICD-10-CM

## 2011-11-17 LAB — COMPREHENSIVE METABOLIC PANEL
Albumin: 4 g/dL (ref 3.5–5.2)
BUN: 26 mg/dL — ABNORMAL HIGH (ref 6–23)
Calcium: 10 mg/dL (ref 8.4–10.5)
Chloride: 109 mEq/L (ref 96–112)
Glucose, Bld: 116 mg/dL — ABNORMAL HIGH (ref 70–99)
Potassium: 5.1 mEq/L (ref 3.5–5.1)

## 2011-11-17 LAB — LIPID PANEL
Cholesterol: 114 mg/dL (ref 0–200)
Triglycerides: 81 mg/dL (ref 0.0–149.0)

## 2011-11-20 LAB — HM DIABETES EYE EXAM: HM Diabetic Eye Exam: NORMAL

## 2011-11-21 ENCOUNTER — Encounter: Payer: Self-pay | Admitting: Family Medicine

## 2011-11-21 ENCOUNTER — Ambulatory Visit (INDEPENDENT_AMBULATORY_CARE_PROVIDER_SITE_OTHER): Payer: Medicare Other | Admitting: Family Medicine

## 2011-11-21 VITALS — BP 138/60 | HR 68 | Temp 97.5°F | Ht 68.0 in | Wt 143.8 lb

## 2011-11-21 DIAGNOSIS — I10 Essential (primary) hypertension: Secondary | ICD-10-CM

## 2011-11-21 DIAGNOSIS — E119 Type 2 diabetes mellitus without complications: Secondary | ICD-10-CM

## 2011-11-21 DIAGNOSIS — E785 Hyperlipidemia, unspecified: Secondary | ICD-10-CM

## 2011-11-21 MED ORDER — LISINOPRIL 40 MG PO TABS: 40.0000 mg | ORAL_TABLET | Freq: Every day | ORAL | Status: DC

## 2011-11-21 MED ORDER — AMLODIPINE BESYLATE 5 MG PO TABS: 5.0000 mg | ORAL_TABLET | Freq: Every day | ORAL | Status: DC

## 2011-11-21 NOTE — Assessment & Plan Note (Signed)
bp in fair control at this time  No changes needed  Disc lifstyle change with low sodium diet and exercise   

## 2011-11-21 NOTE — Patient Instructions (Signed)
Please eat a bedtime snack each night with PROTIEN-- like dairy product/ lean meat/ nuts/ peanut butter  If sugars get too low - let me know  Stay active  No change in exercise  Get your shingles shot as planned  Follow up in 6 months for 30 minute check up with labs prior

## 2011-11-21 NOTE — Assessment & Plan Note (Signed)
Excellent control on very low dose simvastatin and diet Disc goals for lipids and reasons to control them Rev labs with pt Rev low sat fat diet in detail

## 2011-11-21 NOTE — Assessment & Plan Note (Signed)
Very good control with bid checks due to occ hypoglycemia  Pt does not want to cut amaryl dose Suggested bedtime snack with protien  Up to date on opthy/ foot exam/ on ace  No changes F/u 6 mo

## 2011-11-21 NOTE — Progress Notes (Signed)
Subjective:    Patient ID: David Henry, male    DOB: Nov 20, 1926, 77 y.o.   MRN: [id number]  HPI Here for f/u of HTN and lipids and DM No new medical problems   Wt is up 1 lb with good bmi of 21  bp stable 138/60 first check No cp or edema or palpitations or ha   Lipids are well controlled with zocor and diet  No side eff Lab Results  Component Value Date   CHOL 114 11/17/2011   HDL 46.70 11/17/2011   LDLCALC 51 11/17/2011   TRIG 81.0 11/17/2011   CHOLHDL 2 11/17/2011    Diet is excellent - low fat and sugar At goal for that   DM a1c up to 6.6 today from 6.2 Still well controlled A bit more reckless with diet -- was not eating enough before , also the holiday  Checks sugars bid -- in eve around 150 (2-3 hours) , in am fasting - 80-100  Never did cut his amaryl -- afraid to , though occ feels like low sugar at night 58-70  No med changes  opthy -- exam was yesterday  Had cataract surgery too , no retinopathy On ace  Had flu shot   Patient Active Problem List  Diagnoses  . DIABETES MELLITUS, TYPE II  . HYPERLIPIDEMIA  . HYPERTENSION  . MYOCARDIAL INFARCTION, HX OF  . CORONARY ARTERY DISEASE  . ACTINIC KERATOSIS  . GERD   Past Medical History  Diagnosis Date  . Diabetes mellitus     type II  . Hyperlipidemia   . Hypertension   . CAD (coronary artery disease)   . Colon polyp   . Kidney stones   . Neuropathy    Past Surgical History  Procedure Date  . Appendectomy 1984  . Cataract extraction 04/2001  . Cholecystectomy 1989  . Retinal detachment surgery 2003  . Shoulder surgery 2012    Lt shoulder repair   History  Substance Use Topics  . Smoking status: Never Smoker   . Smokeless tobacco: Not on file  . Alcohol Use: No   Family History  Problem Relation Age of Onset  . Cancer Brother     prostate CA   Allergies  Allergen Reactions  . Azithromycin     REACTION: nausea   Current Outpatient Prescriptions on File Prior to Visit    Medication Sig Dispense Refill  . aspirin 81 MG tablet Take 81 mg by mouth daily.        Marland Kitchen glucose blood test strip 1 each by Other route 2 (two) times daily as needed. Use as instructed       . guanFACINE (TENEX) 1 MG tablet Take one by mouth daily at bedtime  90 tablet  3  . metFORMIN (GLUCOPHAGE) 500 MG tablet Take 2 tablets (1,000 mg total) by mouth 2 (two) times daily with a meal.  180 tablet  3  . Multiple Minerals-Vitamins (CITRACAL PLUS) TABS Take 1 tablet by mouth daily.        . Multiple Vitamin (MULTIVITAMIN) capsule Take 1 capsule by mouth daily.        . Omega-3 Fatty Acids (FISH OIL PO) Take 2 capsules by mouth daily.        Marland Kitchen omeprazole (PRILOSEC OTC) 20 MG tablet Take 20 mg by mouth daily.        . simvastatin (ZOCOR) 40 MG tablet Take 20 mg by mouth daily.        . Tamsulosin  HCl (FLOMAX) 0.4 MG CAPS Take 0.4 mg by mouth daily.        Marland Kitchen acetaminophen (TYLENOL ARTHRITIS PAIN) 650 MG CR tablet Take 650 mg by mouth every 8 (eight) hours as needed.        . celecoxib (CELEBREX) 200 MG capsule Take 200 mg by mouth daily.        Marland Kitchen HYDROcodone-acetaminophen (VICODIN) 5-500 MG per tablet Take 1 tablet by mouth every 6 (six) hours as needed.            Review of Systems Review of Systems  Constitutional: Negative for fever, appetite change, fatigue and unexpected weight change.  Eyes: Negative for pain and visual disturbance.  Respiratory: Negative for cough and shortness of breath.   Cardiovascular: Negative for cp or palpitations    Gastrointestinal: Negative for nausea, diarrhea and constipation.  Genitourinary: Negative for urgency and frequency.  Skin: Negative for pallor or rash   Neurological: Negative for weakness, light-headedness, numbness and headaches.  Hematological: Negative for adenopathy. Does not bruise/bleed easily.  Psychiatric/Behavioral: Negative for dysphoric mood. The patient is not nervous/anxious.          Objective:   Physical Exam   Constitutional: He appears well-developed and well-nourished. No distress.  HENT:  Head: Normocephalic and atraumatic.  Mouth/Throat: Oropharynx is clear and moist.  Eyes: Conjunctivae and EOM are normal. Pupils are equal, round, and reactive to light. No scleral icterus.  Neck: Normal range of motion. Neck supple. No JVD present. Carotid bruit is not present. No thyromegaly present.  Cardiovascular: Normal rate, regular rhythm, normal heart sounds and intact distal pulses.   Pulmonary/Chest: Effort normal and breath sounds normal. No respiratory distress. He has no wheezes. He exhibits no tenderness.  Abdominal: Soft. Bowel sounds are normal. He exhibits no distension, no abdominal bruit and no mass. There is no tenderness.  Musculoskeletal: Normal range of motion. He exhibits no edema and no tenderness.  Lymphadenopathy:    He has no cervical adenopathy.  Neurological: He is alert. He has normal reflexes. No cranial nerve deficit. He exhibits normal muscle tone. Coordination normal.  Skin: Skin is warm and dry. No rash noted. No erythema. No pallor.  Psychiatric: He has a normal mood and affect.          Assessment & Plan:

## 2012-02-05 ENCOUNTER — Other Ambulatory Visit: Payer: Self-pay | Admitting: *Deleted

## 2012-02-05 MED ORDER — OMEPRAZOLE MAGNESIUM 20 MG PO TBEC: 20.0000 mg | DELAYED_RELEASE_TABLET | Freq: Every day | ORAL | Status: DC

## 2012-02-19 ENCOUNTER — Other Ambulatory Visit: Payer: Self-pay | Admitting: *Deleted

## 2012-02-19 MED ORDER — METFORMIN HCL 500 MG PO TABS: 1000.0000 mg | ORAL_TABLET | Freq: Two times a day (BID) | ORAL | Status: DC

## 2012-02-19 NOTE — Telephone Encounter (Signed)
David Henry with [facility] pharmacy called stating that Dr. Milinda Antis refilled Metformin electronically but only allowed for 180 tablets and patient has been getting 360 tablets.  I gave verbal authorization to fill for the other 180 tablets.

## 2012-03-22 ENCOUNTER — Other Ambulatory Visit: Payer: Self-pay | Admitting: *Deleted

## 2012-03-22 MED ORDER — SIMVASTATIN 40 MG PO TABS: 20.0000 mg | ORAL_TABLET | Freq: Every day | ORAL | Status: DC

## 2012-05-13 ENCOUNTER — Telehealth: Payer: Self-pay | Admitting: Family Medicine

## 2012-05-13 DIAGNOSIS — E119 Type 2 diabetes mellitus without complications: Secondary | ICD-10-CM

## 2012-05-13 DIAGNOSIS — I10 Essential (primary) hypertension: Secondary | ICD-10-CM

## 2012-05-13 NOTE — Telephone Encounter (Signed)
Message copied by Judy Pimple on Thu May 13, 2012  8:55 PM ------      Message from: Alvina Chou      Created: Wed May 12, 2012  9:42 AM      Regarding: labs for Fri 5-24       Labs for 6 month f/u

## 2012-05-14 ENCOUNTER — Other Ambulatory Visit (INDEPENDENT_AMBULATORY_CARE_PROVIDER_SITE_OTHER): Payer: Medicare Other

## 2012-05-14 DIAGNOSIS — E119 Type 2 diabetes mellitus without complications: Secondary | ICD-10-CM

## 2012-05-14 DIAGNOSIS — I10 Essential (primary) hypertension: Secondary | ICD-10-CM

## 2012-05-14 LAB — COMPREHENSIVE METABOLIC PANEL
ALT: 18 U/L (ref 0–53)
Albumin: 3.7 g/dL (ref 3.5–5.2)
CO2: 26 mEq/L (ref 19–32)
GFR: 55.92 mL/min — ABNORMAL LOW (ref >60.00)
Glucose, Bld: 138 mg/dL — ABNORMAL HIGH (ref 70–99)
Potassium: 5 mEq/L (ref 3.5–5.1)
Sodium: 143 mEq/L (ref 135–145)
Total Bilirubin: 0.9 mg/dL (ref 0.3–1.2)
Total Protein: 6.5 g/dL (ref 6.0–8.3)

## 2012-05-21 ENCOUNTER — Ambulatory Visit (INDEPENDENT_AMBULATORY_CARE_PROVIDER_SITE_OTHER): Payer: Medicare Other | Admitting: Family Medicine

## 2012-05-21 ENCOUNTER — Encounter: Payer: Self-pay | Admitting: Family Medicine

## 2012-05-21 VITALS — BP 130/60 | HR 64 | Temp 98.7°F | Ht 68.0 in | Wt 144.8 lb

## 2012-05-21 DIAGNOSIS — E119 Type 2 diabetes mellitus without complications: Secondary | ICD-10-CM

## 2012-05-21 DIAGNOSIS — I10 Essential (primary) hypertension: Secondary | ICD-10-CM

## 2012-05-21 MED ORDER — GLIMEPIRIDE 4 MG PO TABS: 4.0000 mg | ORAL_TABLET | Freq: Two times a day (BID) | ORAL | Status: DC

## 2012-05-21 MED ORDER — OMEPRAZOLE MAGNESIUM 20 MG PO TBEC: 20.0000 mg | DELAYED_RELEASE_TABLET | Freq: Every day | ORAL | Status: DC

## 2012-05-21 NOTE — Patient Instructions (Signed)
Keep following diabetic diet as close as you can and try to get in exercise every day  Do not skip meals  a1c is up but not too much  Follow up in 6 months for annual exam with labs prior

## 2012-05-21 NOTE — Progress Notes (Signed)
Subjective:    Patient ID: David Henry, male    DOB: 1926/03/31, 78 y.o.   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 DIABETES MELLITUS, TYPE II  . HYPERLIPIDEMIA  . HYPERTENSION  . MYOCARDIAL INFARCTION, HX OF  . CORONARY ARTERY DISEASE  . ACTINIC KERATOSIS  . GERD   Past Medical History  Diagnosis Date  . Diabetes mellitus     type II  . Hyperlipidemia   . Hypertension   . CAD (coronary artery disease)   . Colon polyp   . Kidney stones   . Neuropathy    Past Surgical History  Procedure Date  . Appendectomy 1984  . Cataract extraction 04/2001  . Cholecystectomy 1989  . Retinal detachment surgery 2003  . Shoulder surgery 2012    Lt shoulder repair   History  Substance Use Topics  . Smoking status: Never Smoker   . Smokeless tobacco: Not on file  . Alcohol Use: No   Family History  Problem Relation Age of Onset  . Cancer Brother     prostate CA   Allergies  Allergen Reactions  . Azithromycin     REACTION: nausea   Current Outpatient Prescriptions on File Prior to Visit  Medication Sig Dispense Refill  . acetaminophen (TYLENOL ARTHRITIS PAIN) 650 MG CR tablet Take 650 mg by mouth every 8 (eight) hours as needed.        Marland Kitchen amLODipine (NORVASC) 5 MG tablet Take 1 tablet (5 mg total) by mouth daily.  90 tablet  3  . aspirin 81 MG tablet Take 81 mg by mouth daily.        . celecoxib (CELEBREX) 200  MG capsule Take 200 mg by mouth daily.        Marland Kitchen glimepiride (AMARYL) 4 MG tablet 1 by mouth in am and 1 pill by mouth in pm       . glucose blood test strip 1 each by Other route 2 (two) times daily as needed. Use as instructed       . guanFACINE (TENEX) 1 MG tablet Take one by mouth daily at bedtime  90 tablet  3  . HYDROcodone-acetaminophen (VICODIN) 5-500 MG per tablet Take 1 tablet by mouth every 6 (six) hours as needed.        Marland Kitchen lisinopril (PRINIVIL,ZESTRIL) 40 MG tablet Take 1 tablet (40 mg total) by mouth daily.  90 tablet  3  . magnesium oxide (MAG-OX) 400 MG tablet Take 400 mg by mouth daily.        . metFORMIN (GLUCOPHAGE) 500 MG tablet Take 2 tablets (1,000 mg total) by mouth 2 (two) times daily with a meal.  180 tablet  3  . Multiple Minerals-Vitamins (CITRACAL PLUS) TABS Take 1 tablet by mouth daily.        Marland Kitchen  Multiple Vitamin (MULTIVITAMIN) capsule Take 1 capsule by mouth daily.        . Omega-3 Fatty Acids (FISH OIL PO) Take 2 capsules by mouth daily.        Marland Kitchen omeprazole (PRILOSEC OTC) 20 MG tablet Take 1 tablet (20 mg total) by mouth daily.  90 tablet  3  . simvastatin (ZOCOR) 40 MG tablet Take 0.5 tablets (20 mg total) by mouth daily.  45 tablet  6  . Tamsulosin HCl (FLOMAX) 0.4 MG CAPS Take 0.4 mg by mouth daily.              Review of Systems Review of Systems  Constitutional: Negative for fever, appetite change, fatigue and unexpected weight change.  Eyes: Negative for pain and visual disturbance.  Respiratory: Negative for cough and shortness of breath.   Cardiovascular: Negative for cp or palpitations    Gastrointestinal: Negative for nausea, diarrhea and constipation.  Genitourinary: Negative for urgency and frequency.  Skin: Negative for pallor or rash   Neurological: Negative for weakness, light-headedness, numbness and headaches.  Hematological: Negative for adenopathy. Does not bruise/bleed easily.  Psychiatric/Behavioral: Negative for dysphoric mood. The  patient is not nervous/anxious.         Objective:   Physical Exam  Constitutional: He appears well-developed and well-nourished. No distress.  HENT:  Head: Normocephalic and atraumatic.  Mouth/Throat: Oropharynx is clear and moist.  Eyes: Conjunctivae and EOM are normal. Pupils are equal, round, and reactive to light. Right eye exhibits no discharge. Left eye exhibits no discharge.  Neck: Normal range of motion. Neck supple. No JVD present. Carotid bruit is not present. No thyromegaly present.  Cardiovascular: Normal rate, regular rhythm, normal heart sounds and intact distal pulses.  Exam reveals no gallop.   Pulmonary/Chest: Effort normal and breath sounds normal. No respiratory distress. He has no wheezes.  Abdominal: Soft. Bowel sounds are normal. He exhibits no distension, no abdominal bruit and no mass. There is no tenderness.  Musculoskeletal: Normal range of motion. He exhibits no edema and no tenderness.  Lymphadenopathy:    He has no cervical adenopathy.  Neurological: He is alert. He has normal reflexes. No cranial nerve deficit. He exhibits normal muscle tone. Coordination normal.  Skin: Skin is warm and dry. No rash noted. No erythema. No pallor.  Psychiatric: He has a normal mood and affect.          Assessment & Plan:

## 2012-05-23 NOTE — Assessment & Plan Note (Signed)
bp in fair control at this time  No changes needed  Disc lifstyle change with low sodium diet and exercise   

## 2012-05-23 NOTE — Assessment & Plan Note (Signed)
a1c is up to 7.1 despite good /stable diet and exercise  Suspect DM is progressing with age  Rev meds/ diet and exercise  Overall doing well  Continue to monitor  Disc dangers of hypoglycemia and need to avoid that

## 2012-05-25 ENCOUNTER — Telehealth: Payer: Self-pay

## 2012-05-25 NOTE — Telephone Encounter (Signed)
Done and in IN box thanks 

## 2012-05-25 NOTE — Telephone Encounter (Signed)
[employer] faxed certificate of medical necessity for diabetic supplies. Confirmed with pt he did request this and does use this company.Form is on your shelf (included copy of last certification).

## 2012-05-26 ENCOUNTER — Encounter: Payer: Self-pay | Admitting: Family Medicine

## 2012-06-07 ENCOUNTER — Ambulatory Visit: Payer: Self-pay | Admitting: Urology

## 2012-06-11 ENCOUNTER — Telehealth: Payer: Self-pay | Admitting: *Deleted

## 2012-06-11 NOTE — Telephone Encounter (Signed)
In my box

## 2012-06-11 NOTE — Telephone Encounter (Signed)
Was this form faxed (do not see in scanned material) and can this encounter be closed?

## 2012-06-11 NOTE — Telephone Encounter (Signed)
Form faxed

## 2012-06-11 NOTE — Telephone Encounter (Signed)
Form for diabetic supplies is on your desk.  I checked with the patient and he does want these supplies.  Dr. Royden Purl patient- she's out of the office.

## 2012-09-15 ENCOUNTER — Ambulatory Visit (INDEPENDENT_AMBULATORY_CARE_PROVIDER_SITE_OTHER): Payer: Medicare Other

## 2012-09-15 DIAGNOSIS — Z23 Encounter for immunization: Secondary | ICD-10-CM

## 2012-09-21 ENCOUNTER — Other Ambulatory Visit: Payer: Self-pay | Admitting: *Deleted

## 2012-09-21 MED ORDER — GUANFACINE HCL 1 MG PO TABS: ORAL_TABLET | ORAL | Status: DC

## 2012-09-21 NOTE — Telephone Encounter (Signed)
Received faxed refill request from pharmacy. Refill sent to pharmacy electronically. 

## 2012-11-14 ENCOUNTER — Telehealth: Payer: Self-pay | Admitting: Family Medicine

## 2012-11-14 DIAGNOSIS — E119 Type 2 diabetes mellitus without complications: Secondary | ICD-10-CM

## 2012-11-14 DIAGNOSIS — I10 Essential (primary) hypertension: Secondary | ICD-10-CM

## 2012-11-14 DIAGNOSIS — K219 Gastro-esophageal reflux disease without esophagitis: Secondary | ICD-10-CM

## 2012-11-14 DIAGNOSIS — E785 Hyperlipidemia, unspecified: Secondary | ICD-10-CM

## 2012-11-14 NOTE — Telephone Encounter (Signed)
Message copied by Judy Pimple on Sun Nov 14, 2012  8:20 PM ------      Message from: Baldomero Lamy      Created: Thu Nov 04, 2012 11:44 AM      Regarding: Cpx labs 11/15/12 Mon       Please order  future cpx labs for pt's upcomming lab appt.      Thanks      Rodney Booze

## 2012-11-15 ENCOUNTER — Other Ambulatory Visit (INDEPENDENT_AMBULATORY_CARE_PROVIDER_SITE_OTHER): Payer: Medicare Other

## 2012-11-15 DIAGNOSIS — E785 Hyperlipidemia, unspecified: Secondary | ICD-10-CM

## 2012-11-15 DIAGNOSIS — K219 Gastro-esophageal reflux disease without esophagitis: Secondary | ICD-10-CM

## 2012-11-15 DIAGNOSIS — E119 Type 2 diabetes mellitus without complications: Secondary | ICD-10-CM

## 2012-11-15 DIAGNOSIS — I10 Essential (primary) hypertension: Secondary | ICD-10-CM

## 2012-11-15 LAB — CBC WITH DIFFERENTIAL/PLATELET
Basophils Relative: 0.7 % (ref 0.0–3.0)
Eosinophils Relative: 8 % — ABNORMAL HIGH (ref 0.0–5.0)
HCT: 34.1 % — ABNORMAL LOW (ref 39.0–52.0)
Hemoglobin: 11.2 g/dL — ABNORMAL LOW (ref 13.0–17.0)
Lymphs Abs: 1.4 10*3/uL (ref 0.7–4.0)
MCV: 97.3 fl (ref 78.0–100.0)
Monocytes Absolute: 0.5 10*3/uL (ref 0.1–1.0)
Monocytes Relative: 8.2 % (ref 3.0–12.0)
Neutro Abs: 4 10*3/uL (ref 1.4–7.7)
Platelets: 248 10*3/uL (ref 150.0–400.0)
RBC: 3.51 Mil/uL — ABNORMAL LOW (ref 4.22–5.81)
WBC: 6.4 10*3/uL (ref 4.5–10.5)

## 2012-11-15 LAB — COMPREHENSIVE METABOLIC PANEL
Alkaline Phosphatase: 53 U/L (ref 39–117)
BUN: 26 mg/dL — ABNORMAL HIGH (ref 6–23)
CO2: 27 mEq/L (ref 19–32)
Creatinine, Ser: 1.3 mg/dL (ref 0.4–1.5)
GFR: 54.87 mL/min — ABNORMAL LOW (ref >60.00)
Glucose, Bld: 120 mg/dL — ABNORMAL HIGH (ref 70–99)
Total Bilirubin: 1 mg/dL (ref 0.3–1.2)

## 2012-11-15 LAB — LIPID PANEL
Cholesterol: 105 mg/dL (ref 0–200)
HDL: 41 mg/dL (ref >39.00)
Total CHOL/HDL Ratio: 3
Triglycerides: 150 mg/dL — ABNORMAL HIGH (ref 0.0–149.0)

## 2012-11-23 ENCOUNTER — Encounter: Payer: Self-pay | Admitting: Family Medicine

## 2012-11-23 ENCOUNTER — Ambulatory Visit (INDEPENDENT_AMBULATORY_CARE_PROVIDER_SITE_OTHER): Payer: Medicare Other | Admitting: Family Medicine

## 2012-11-23 VITALS — BP 132/58 | HR 63 | Temp 97.5°F | Ht 67.0 in | Wt 142.5 lb

## 2012-11-23 DIAGNOSIS — R6889 Other general symptoms and signs: Secondary | ICD-10-CM

## 2012-11-23 DIAGNOSIS — D649 Anemia, unspecified: Secondary | ICD-10-CM

## 2012-11-23 DIAGNOSIS — E785 Hyperlipidemia, unspecified: Secondary | ICD-10-CM

## 2012-11-23 DIAGNOSIS — E039 Hypothyroidism, unspecified: Secondary | ICD-10-CM

## 2012-11-23 DIAGNOSIS — E119 Type 2 diabetes mellitus without complications: Secondary | ICD-10-CM

## 2012-11-23 DIAGNOSIS — Z1211 Encounter for screening for malignant neoplasm of colon: Secondary | ICD-10-CM | POA: Insufficient documentation

## 2012-11-23 DIAGNOSIS — R7989 Other specified abnormal findings of blood chemistry: Secondary | ICD-10-CM

## 2012-11-23 DIAGNOSIS — I10 Essential (primary) hypertension: Secondary | ICD-10-CM

## 2012-11-23 LAB — FERRITIN: Ferritin: 11.2 ng/mL — ABNORMAL LOW (ref 22.0–322.0)

## 2012-11-23 MED ORDER — METFORMIN HCL 1000 MG PO TABS: 1000.0000 mg | ORAL_TABLET | Freq: Two times a day (BID) | ORAL | Status: DC

## 2012-11-23 MED ORDER — GUANFACINE HCL 1 MG PO TABS: ORAL_TABLET | ORAL | Status: DC

## 2012-11-23 MED ORDER — AMLODIPINE BESYLATE 5 MG PO TABS: 5.0000 mg | ORAL_TABLET | Freq: Every day | ORAL | Status: DC

## 2012-11-23 MED ORDER — LISINOPRIL 40 MG PO TABS: 40.0000 mg | ORAL_TABLET | Freq: Every day | ORAL | Status: DC

## 2012-11-23 NOTE — Assessment & Plan Note (Signed)
IFOB given due to slt anemia  Has had polyps in past  Due to age-another colonosc not recommended

## 2012-11-23 NOTE — Assessment & Plan Note (Signed)
Good control Disc goals for lipids and reasons to control them Rev labs with pt Rev low sat fat diet in detail Will continue zocor and diet Hx of CAD that is stable

## 2012-11-23 NOTE — Assessment & Plan Note (Signed)
Re check tsh-slt high, with free T4

## 2012-11-23 NOTE — Patient Instructions (Addendum)
I am changing your metformin to the 1000 mg pill so you only have to take one pill twice daily  Take care of yourself  Thyroid and iron labs ok Follow up in 6 months with labs prior

## 2012-11-23 NOTE — Assessment & Plan Note (Signed)
Very mild - may be diet rel or of chronic dz in diabetic/ elderly Will do ifob  No complaints Ferritin today

## 2012-11-23 NOTE — Progress Notes (Signed)
Subjective:    Patient ID: David Henry, male    DOB: [birth year], 78 y.o.   MRN: [id number]  HPI Here for check up of chronic medical conditions and to review health mt list  Is feeling pretty good for his age  Nothing new going on   Wt is down 2lb with bmi of 22 Is eating very healthy ? If he gets enough iron   Needs refils of his meds Hard to swallow his metformin  Zoster status- had his vaccine in august   colonosc 12/08- hx of polyp Was not told to come back due to age   bp is stable today  No cp or palpitations or headaches or edema  No side effects to medicines  BP Readings from Last 3 Encounters:  11/23/12 132/58  05/21/12 130/60  11/21/11 138/60     DM Lab Results  Component Value Date   HGBA1C 6.9* 11/15/2012   this is down from 7.1 Sugars have been good  opthy- April - was ok   Lab Results  Component Value Date   CHOL 105 11/15/2012   CHOL 114 11/17/2011   CHOL 101 05/13/2011   Lab Results  Component Value Date   HDL 41.00 11/15/2012   HDL 46.70 11/17/2011   HDL 41.70 05/13/2011   Lab Results  Component Value Date   LDLCALC 34 11/15/2012   LDLCALC 51 11/17/2011   LDLCALC 40 05/13/2011   Lab Results  Component Value Date   TRIG 150.0* 11/15/2012   TRIG 81.0 11/17/2011   TRIG 98.0 05/13/2011   Lab Results  Component Value Date   CHOLHDL 3 11/15/2012   CHOLHDL 2 11/17/2011   CHOLHDL 2 05/13/2011   No results found for this basename: LDLDIRECT     tsh up a bit Lab Results  Component Value Date   TSH 5.74* 11/15/2012   mother had thyroid problems He has been more tired   Also slt anemic  HB is 11.2 - down from 12.1  Has not had any falls  He feels generally weak but balance is ok   Mood is good No depression or sadness, and motivation is good  Brother has hx of prostate ca Lab Results  Component Value Date   PSA 2.09 05/13/2011   PSA 1.83 08/18/2005    Patient Active Problem List  Diagnosis  . DIABETES MELLITUS, TYPE II   . HYPERLIPIDEMIA  . HYPERTENSION  . MYOCARDIAL INFARCTION, HX OF  . CORONARY ARTERY DISEASE  . ACTINIC KERATOSIS  . GERD   Past Medical History  Diagnosis Date  . Diabetes mellitus     type II  . Hyperlipidemia   . Hypertension   . CAD (coronary artery disease)   . Colon polyp   . Kidney stones   . Neuropathy    Past Surgical History  Procedure Date  . Appendectomy 1984  . Cataract extraction 04/2001  . Cholecystectomy 1989  . Retinal detachment surgery 2003  . Shoulder surgery 2012    Lt shoulder repair   History  Substance Use Topics  . Smoking status: Never Smoker   . Smokeless tobacco: Not on file  . Alcohol Use: No   Family History  Problem Relation Age of Onset  . Cancer Brother     prostate CA   Allergies  Allergen Reactions  . Azithromycin     REACTION: nausea   Current Outpatient Prescriptions on File Prior to Visit  Medication Sig Dispense Refill  . acetaminophen (TYLENOL ARTHRITIS  PAIN) 650 MG CR tablet Take 650 mg by mouth every 8 (eight) hours as needed.        Marland Kitchen amLODipine (NORVASC) 5 MG tablet Take 1 tablet (5 mg total) by mouth daily.  90 tablet  3  . aspirin 81 MG tablet Take 81 mg by mouth daily.        . celecoxib (CELEBREX) 200 MG capsule Take 200 mg by mouth daily.        Marland Kitchen glimepiride (AMARYL) 4 MG tablet Take 1 tablet (4 mg total) by mouth 2 (two) times daily. 1 by mouth in am and 1 pill by mouth in pm  60 tablet  11  . glucose blood test strip 1 each by Other route 2 (two) times daily as needed. Use as instructed       . guanFACINE (TENEX) 1 MG tablet Take one by mouth daily at bedtime  90 tablet  0  . HYDROcodone-acetaminophen (VICODIN) 5-500 MG per tablet Take 1 tablet by mouth every 6 (six) hours as needed.        Marland Kitchen lisinopril (PRINIVIL,ZESTRIL) 40 MG tablet Take 1 tablet (40 mg total) by mouth daily.  90 tablet  3  . magnesium oxide (MAG-OX) 400 MG tablet Take 400 mg by mouth daily.        . metFORMIN (GLUCOPHAGE) 500 MG tablet Take  2 tablets (1,000 mg total) by mouth 2 (two) times daily with a meal.  180 tablet  3  . Multiple Minerals-Vitamins (CITRACAL PLUS) TABS Take 1 tablet by mouth daily.        . Multiple Vitamin (MULTIVITAMIN) capsule Take 1 capsule by mouth daily.        . Omega-3 Fatty Acids (FISH OIL PO) Take 2 capsules by mouth daily.        Marland Kitchen omeprazole (PRILOSEC OTC) 20 MG tablet Take 1 tablet (20 mg total) by mouth daily.  90 tablet  3  . simvastatin (ZOCOR) 40 MG tablet Take 0.5 tablets (20 mg total) by mouth daily.  45 tablet  6  . Tamsulosin HCl (FLOMAX) 0.4 MG CAPS Take 0.4 mg by mouth daily.           Review of Systems Review of Systems  Constitutional: Negative for fever, appetite change, fatigue and unexpected weight change. pos for some generalized weakness he thinks is age related  Eyes: Negative for pain and visual disturbance.  Respiratory: Negative for cough and shortness of breath.   Cardiovascular: Negative for cp or palpitations    Gastrointestinal: Negative for nausea, diarrhea and constipation.  Genitourinary: Negative for urgency and frequency.  Skin: Negative for pallor or rash   Neurological: Negative for weakness, light-headedness, numbness and headaches. pos for occas burning in feet  Hematological: Negative for adenopathy. Does not bruise/bleed easily.  Psychiatric/Behavioral: Negative for dysphoric mood. The patient is not nervous/anxious.         Objective:   Physical Exam  Constitutional: He appears well-developed and well-nourished. No distress.  HENT:  Head: Normocephalic and atraumatic.  Right Ear: External ear normal.  Left Ear: External ear normal.  Nose: Nose normal.  Mouth/Throat: Oropharynx is clear and moist.  Eyes: Conjunctivae normal and EOM are normal. Pupils are equal, round, and reactive to light. Right eye exhibits no discharge. Left eye exhibits no discharge. No scleral icterus.  Neck: Normal range of motion. Neck supple. No JVD present. Carotid bruit is  not present. No thyromegaly present.  Cardiovascular: Normal rate, regular rhythm, normal heart  sounds and intact distal pulses.  Exam reveals no gallop.   Pulmonary/Chest: Effort normal and breath sounds normal. No respiratory distress. He has no wheezes.  Abdominal: Soft. Bowel sounds are normal. He exhibits no distension, no abdominal bruit and no mass. There is no tenderness.  Musculoskeletal: Normal range of motion. He exhibits no edema and no tenderness.       Some OA in fingers   Lymphadenopathy:    He has no cervical adenopathy.  Neurological: He is alert. He has normal reflexes. No cranial nerve deficit. He exhibits normal muscle tone. Coordination normal.  Skin: Skin is warm and dry. No rash noted. No erythema. No pallor.  Psychiatric: He has a normal mood and affect.          Assessment & Plan:

## 2012-11-23 NOTE — Assessment & Plan Note (Signed)
tsh is slt high Re check it with free T4 today Some fatigue- but he thinks this is age rel

## 2012-11-23 NOTE — Assessment & Plan Note (Signed)
bp in fair control at this time  No changes needed  Disc lifstyle change with low sodium diet and exercise  Labs reviewed today F/u 6 months

## 2012-11-23 NOTE — Assessment & Plan Note (Signed)
Improved with a1c of 6.9 Diet is excellent Change metformin pill to 1000 mg -- if he has a hard time swallowing it - can split in 1/2 and swallow 1/2 at a time Will update No hypoglycemia On ace opthy utd Nl foot exam

## 2012-11-29 ENCOUNTER — Other Ambulatory Visit (INDEPENDENT_AMBULATORY_CARE_PROVIDER_SITE_OTHER): Payer: Medicare Other

## 2012-11-29 DIAGNOSIS — Z1211 Encounter for screening for malignant neoplasm of colon: Secondary | ICD-10-CM

## 2012-11-30 ENCOUNTER — Telehealth: Payer: Self-pay | Admitting: Family Medicine

## 2012-11-30 DIAGNOSIS — D649 Anemia, unspecified: Secondary | ICD-10-CM

## 2012-11-30 DIAGNOSIS — R195 Other fecal abnormalities: Secondary | ICD-10-CM | POA: Insufficient documentation

## 2012-11-30 NOTE — Telephone Encounter (Signed)
Message copied by Judy Pimple on Tue Nov 30, 2012 10:13 AM ------      Message from: Shon Millet      Created: Tue Nov 30, 2012  8:30 AM       Pt notified of stool card results and agrees to see a GI, needs referral

## 2013-01-22 HISTORY — PX: ESOPHAGOGASTRODUODENOSCOPY: SHX1529

## 2013-01-22 HISTORY — PX: COLONOSCOPY: SHX174

## 2013-02-10 ENCOUNTER — Telehealth: Payer: Self-pay

## 2013-02-10 NOTE — Telephone Encounter (Signed)
Pt walked in wanting appt with Dr Milinda Antis because he is weak, tired and no energy. Pt said he wants med for anemia.On 01/17/13 pt had colonoscopy and endoscopy at [facility] service on [address] I called [facility] and Lupita Leash faxed me copy of endoscopy and colonoscopy report which is on Dr Royden Purl shelf in the in box. Pt scheduled appt with Dr Milinda Antis 02/11/13 at 9:15 am. Pt said he does not have any abdominal pain and no bleeding noted. Pt said after procedures nurse told him he had hemorrhoid on big bowel and 2 at rectum;no polyps. If pt's condition were to change or worsen pt will call back or go to UC.

## 2013-02-10 NOTE — Telephone Encounter (Signed)
I will see him on the 21st and we will start iron, thanks

## 2013-02-11 ENCOUNTER — Ambulatory Visit (INDEPENDENT_AMBULATORY_CARE_PROVIDER_SITE_OTHER): Payer: Medicare Other | Admitting: Family Medicine

## 2013-02-11 ENCOUNTER — Encounter: Payer: Self-pay | Admitting: Family Medicine

## 2013-02-11 VITALS — BP 112/60 | HR 69 | Temp 97.7°F | Ht 67.0 in | Wt 147.0 lb

## 2013-02-11 DIAGNOSIS — E119 Type 2 diabetes mellitus without complications: Secondary | ICD-10-CM

## 2013-02-11 MED ORDER — POLYSACCHARIDE IRON COMPLEX 150 MG PO CAPS: 150.0000 mg | ORAL_CAPSULE | Freq: Two times a day (BID) | ORAL | Status: DC

## 2013-02-11 NOTE — Addendum Note (Signed)
Addended by: Alvina Chou on: 02/11/2013 10:07 AM   Modules accepted: Orders

## 2013-02-11 NOTE — Patient Instructions (Addendum)
Take the nu iron once daily with a meal  Schedule non fasting lab in 1 month  I hope you start to feel better

## 2013-02-11 NOTE — Progress Notes (Signed)
  Subjective:    Patient ID: David Henry, male    DOB: 1926-05-26, 79 y.o.   MRN: [id number]  HPI Here for fatigue and anemia   Pt had this c/o in nov- found to be anemic with low ferritin Lab Results  Component Value Date   WBC 6.4 11/15/2012   HGB 11.2* 11/15/2012   HCT 34.1* 11/15/2012   MCV 97.3 11/15/2012   PLT 248.0 11/15/2012    Lab Results  Component Value Date   FERRITIN 11.2* 11/23/2012    Then IFOB pos Since then has had EGD and colonoscopy without any active bleeding Some tics and hemorrhoids were found - and this may be the source of the occult blood Overall very reassuring  He does eat meat (not much red meat)  Eats fortified cereals Not a lot of dark leafy greens   Has never taken iron  He does tend torwards diarrhea  No blood in urine  He sees Dr Achilles Dunk at least every year   Lab Results  Component Value Date   TSH 5.17 11/23/2012      Chemistry      Component Value Date/Time   NA 140 11/15/2012 0841   K 4.6 11/15/2012 0841   CL 107 11/15/2012 0841   CO2 27 11/15/2012 0841   BUN 26* 11/15/2012 0841   CREATININE 1.3 11/15/2012 0841      Component Value Date/Time   CALCIUM 9.6 11/15/2012 0841   ALKPHOS 53 11/15/2012 0841   AST 20 11/15/2012 0841   ALT 21 11/15/2012 0841   BILITOT 1.0 11/15/2012 0841     Lab Results  Component Value Date   HGBA1C 6.9* 11/15/2012       Review of Systems Review of Systems  Constitutional: Negative for fever, appetite change, and unexpected weight change.  Eyes: Negative for pain and visual disturbance.  Respiratory: Negative for cough and shortness of breath.   Cardiovascular: Negative for cp or palpitations    Gastrointestinal: Negative for nausea, diarrhea and constipation.  Genitourinary: Negative for urgency and frequency. neg for hematuria  Skin: Negative for pallor or rash   Neurological: Negative for weakness, light-headedness, numbness and headaches.  Hematological: Negative for adenopathy.  Does not bruise/bleed easily.  Psychiatric/Behavioral: Negative for dysphoric mood. The patient is not nervous/anxious.         Objective:   Physical Exam  Constitutional: He appears well-developed and well-nourished. No distress.  HENT:  Head: Normocephalic and atraumatic.  Mouth/Throat: Oropharynx is clear and moist.  Eyes: EOM are normal. Pupils are equal, round, and reactive to light.  No conj pallor  Neck: Neck supple.  Cardiovascular: Normal rate and regular rhythm.   Pulmonary/Chest: Effort normal and breath sounds normal.  Abdominal: Soft. Bowel sounds are normal. He exhibits no distension and no mass. There is no tenderness. There is no rebound and no guarding.  Musculoskeletal:  No cva tenderness   Lymphadenopathy:    He has no cervical adenopathy.  Neurological: He is alert.  Skin: Skin is warm and dry. No pallor.  Psychiatric: He has a normal mood and affect.          Assessment & Plan:

## 2013-02-11 NOTE — Assessment & Plan Note (Signed)
Likely due to chronic dz but also low ferritin Reassuring colonosc and EGD Hemorrhoids likely resp for ifob pos  Will start nu iron daily  Re check cbc and ferritin 1 mo  Hope this will help energy level Disc side eff incl constipation

## 2013-03-11 ENCOUNTER — Other Ambulatory Visit (INDEPENDENT_AMBULATORY_CARE_PROVIDER_SITE_OTHER): Payer: Medicare Other

## 2013-03-11 DIAGNOSIS — D649 Anemia, unspecified: Secondary | ICD-10-CM

## 2013-03-11 DIAGNOSIS — E119 Type 2 diabetes mellitus without complications: Secondary | ICD-10-CM

## 2013-03-11 LAB — CBC WITH DIFFERENTIAL/PLATELET
Basophils Absolute: 0 10*3/uL (ref 0.0–0.1)
Eosinophils Absolute: 0.3 10*3/uL (ref 0.0–0.7)
Lymphocytes Relative: 19.1 % (ref 12.0–46.0)
MCHC: 32.9 g/dL (ref 30.0–36.0)
Neutrophils Relative %: 68.5 % (ref 43.0–77.0)
RDW: 14.7 % — ABNORMAL HIGH (ref 11.5–14.6)

## 2013-03-11 LAB — HEMOGLOBIN A1C: Hgb A1c MFr Bld: 7.4 % — ABNORMAL HIGH (ref 4.6–6.5)

## 2013-03-14 ENCOUNTER — Telehealth: Payer: Self-pay | Admitting: Family Medicine

## 2013-03-14 DIAGNOSIS — D649 Anemia, unspecified: Secondary | ICD-10-CM

## 2013-03-14 NOTE — Telephone Encounter (Signed)
Message copied by Judy Pimple on Mon Mar 14, 2013 10:05 PM ------      Message from: David Henry      Created: Mon Mar 14, 2013  3:24 PM       Pt agrees with referral, I advise Shirlee Limerick will call to set up appt ------

## 2013-03-14 NOTE — Telephone Encounter (Signed)
Hematology referral

## 2013-03-21 ENCOUNTER — Ambulatory Visit: Payer: Self-pay | Admitting: Internal Medicine

## 2013-03-21 LAB — CBC CANCER CENTER
Bands: 2 %
Eosinophil: 5 %
HCT: 35 % — ABNORMAL LOW (ref 40.0–52.0)
HGB: 11.8 g/dL — ABNORMAL LOW (ref 13.0–18.0)
MCHC: 33.7 g/dL (ref 32.0–36.0)
MCV: 95 fL (ref 80–100)
Monocytes: 6 %
RBC: 3.7 10*6/uL — ABNORMAL LOW (ref 4.40–5.90)
Segmented Neutrophils: 61 %
WBC: 6.5 x10 3/mm (ref 3.8–10.6)

## 2013-03-21 LAB — RETICULOCYTES
Absolute Retic Count: 0.0504 10*6/uL
Reticulocyte: 1.36 %

## 2013-03-21 LAB — IRON AND TIBC
Iron Bind.Cap.(Total): 369 ug/dL (ref 250–450)
Iron: 65 ug/dL (ref 65–175)

## 2013-03-21 LAB — FERRITIN: Ferritin (ARMC): 15 ng/mL (ref 8–388)

## 2013-03-22 ENCOUNTER — Ambulatory Visit: Payer: Self-pay | Admitting: Internal Medicine

## 2013-03-23 ENCOUNTER — Other Ambulatory Visit: Payer: Self-pay | Admitting: *Deleted

## 2013-03-23 LAB — PROT IMMUNOELECTROPHORES(ARMC)

## 2013-03-23 MED ORDER — GUANFACINE HCL 1 MG PO TABS: ORAL_TABLET | ORAL | Status: DC

## 2013-03-23 MED ORDER — SIMVASTATIN 40 MG PO TABS: 20.0000 mg | ORAL_TABLET | Freq: Every day | ORAL | Status: DC

## 2013-04-21 ENCOUNTER — Ambulatory Visit: Payer: Self-pay | Admitting: Internal Medicine

## 2013-05-15 ENCOUNTER — Telehealth: Payer: Self-pay | Admitting: Family Medicine

## 2013-05-15 DIAGNOSIS — E119 Type 2 diabetes mellitus without complications: Secondary | ICD-10-CM

## 2013-05-15 DIAGNOSIS — E785 Hyperlipidemia, unspecified: Secondary | ICD-10-CM

## 2013-05-15 DIAGNOSIS — D649 Anemia, unspecified: Secondary | ICD-10-CM

## 2013-05-15 DIAGNOSIS — I10 Essential (primary) hypertension: Secondary | ICD-10-CM

## 2013-05-15 NOTE — Telephone Encounter (Signed)
Message copied by Judy Pimple on Sun May 15, 2013 12:08 PM ------      Message from: Alvina Chou      Created: Fri Apr 29, 2013 12:06 PM      Regarding: lab orders for Wednesday, 5.28.14       Labs for a 6 month f/u ------

## 2013-05-18 ENCOUNTER — Other Ambulatory Visit (INDEPENDENT_AMBULATORY_CARE_PROVIDER_SITE_OTHER): Payer: Medicare Other

## 2013-05-18 DIAGNOSIS — E785 Hyperlipidemia, unspecified: Secondary | ICD-10-CM

## 2013-05-18 DIAGNOSIS — E039 Hypothyroidism, unspecified: Secondary | ICD-10-CM

## 2013-05-18 DIAGNOSIS — R6889 Other general symptoms and signs: Secondary | ICD-10-CM

## 2013-05-18 DIAGNOSIS — D649 Anemia, unspecified: Secondary | ICD-10-CM

## 2013-05-18 DIAGNOSIS — I10 Essential (primary) hypertension: Secondary | ICD-10-CM

## 2013-05-18 DIAGNOSIS — E119 Type 2 diabetes mellitus without complications: Secondary | ICD-10-CM

## 2013-05-18 DIAGNOSIS — R7989 Other specified abnormal findings of blood chemistry: Secondary | ICD-10-CM

## 2013-05-18 LAB — LIPID PANEL
Cholesterol: 105 mg/dL (ref 0–200)
HDL: 38.4 mg/dL — ABNORMAL LOW (ref >39.00)
LDL Cholesterol: 46 mg/dL (ref 0–99)
VLDL: 20.4 mg/dL (ref 0.0–40.0)

## 2013-05-18 LAB — CBC WITH DIFFERENTIAL/PLATELET
Basophils Relative: 0.7 % (ref 0.0–3.0)
Eosinophils Absolute: 0.4 10*3/uL (ref 0.0–0.7)
Eosinophils Relative: 6.1 % — ABNORMAL HIGH (ref 0.0–5.0)
Hemoglobin: 11.7 g/dL — ABNORMAL LOW (ref 13.0–17.0)
MCHC: 33.4 g/dL (ref 30.0–36.0)
MCV: 96.5 fl (ref 78.0–100.0)
Monocytes Absolute: 0.5 10*3/uL (ref 0.1–1.0)
Neutro Abs: 4 10*3/uL (ref 1.4–7.7)
RBC: 3.62 Mil/uL — ABNORMAL LOW (ref 4.22–5.81)
WBC: 6.1 10*3/uL (ref 4.5–10.5)

## 2013-05-18 LAB — COMPREHENSIVE METABOLIC PANEL
AST: 19 U/L (ref 0–37)
Albumin: 3.6 g/dL (ref 3.5–5.2)
Alkaline Phosphatase: 54 U/L (ref 39–117)
BUN: 29 mg/dL — ABNORMAL HIGH (ref 6–23)
Calcium: 9.6 mg/dL (ref 8.4–10.5)
Chloride: 109 mEq/L (ref 96–112)
Creatinine, Ser: 1.6 mg/dL — ABNORMAL HIGH (ref 0.4–1.5)
Glucose, Bld: 68 mg/dL — ABNORMAL LOW (ref 70–99)

## 2013-05-18 LAB — HEMOGLOBIN A1C: Hgb A1c MFr Bld: 7.3 % — ABNORMAL HIGH (ref 4.6–6.5)

## 2013-05-24 ENCOUNTER — Ambulatory Visit (INDEPENDENT_AMBULATORY_CARE_PROVIDER_SITE_OTHER): Payer: Medicare Other | Admitting: Family Medicine

## 2013-05-24 ENCOUNTER — Encounter: Payer: Self-pay | Admitting: Family Medicine

## 2013-05-24 VITALS — BP 124/68 | HR 62 | Temp 97.7°F | Ht 67.0 in | Wt 147.5 lb

## 2013-05-24 DIAGNOSIS — N289 Disorder of kidney and ureter, unspecified: Secondary | ICD-10-CM

## 2013-05-24 DIAGNOSIS — I10 Essential (primary) hypertension: Secondary | ICD-10-CM

## 2013-05-24 DIAGNOSIS — E785 Hyperlipidemia, unspecified: Secondary | ICD-10-CM

## 2013-05-24 DIAGNOSIS — E119 Type 2 diabetes mellitus without complications: Secondary | ICD-10-CM

## 2013-05-24 DIAGNOSIS — D649 Anemia, unspecified: Secondary | ICD-10-CM

## 2013-05-24 LAB — POCT URINALYSIS DIPSTICK
Glucose, UA: NEGATIVE
Ketones, UA: NEGATIVE
Spec Grav, UA: 1.015

## 2013-05-24 NOTE — Patient Instructions (Addendum)
Labs look stable but kidney numbers are up  Try to drink more water - aim for at least 8 servings per day Leave a urine sample on the way out Schedule non fasting labs in 1 month for kidney function also

## 2013-05-24 NOTE — Assessment & Plan Note (Signed)
bp is stable today  No cp or palpitations or headaches or edema  No side effects to medicines  BP Readings from Last 3 Encounters:  05/24/13 124/68  02/11/13 112/60  11/23/12 132/58

## 2013-05-24 NOTE — Assessment & Plan Note (Signed)
Cr is up to 1.6- DM and HTN and hx of vasc dz ua and microalb today On ace  May be dehydrated-long disc re: inc water intake  Re check 1 mo  If no imp may need to change metformin and ref to renal

## 2013-05-24 NOTE — Assessment & Plan Note (Signed)
Statin and diet Disc goals for lipids and reasons to control them Rev labs with pt Rev low sat fat diet in detail

## 2013-05-24 NOTE — Assessment & Plan Note (Signed)
Improved with nu iron bid  For heme f/u  Overall doing well

## 2013-05-24 NOTE — Progress Notes (Signed)
Subjective:    Patient ID: David Henry, male    DOB: 1926/08/10, 79 y.o.   MRN: [id number]  HPI Here for f/u of chronic medical problems Overall feeling pretty good   bp is stable today  No cp or palpitations or headaches or edema  No side effects to medicines  BP Readings from Last 3 Encounters:  05/24/13 124/68  02/11/13 112/60  11/23/12 132/58       Chemistry      Component Value Date/Time   NA 142 05/18/2013 0832   K 5.0 05/18/2013 0832   CL 109 05/18/2013 0832   CO2 26 05/18/2013 0832   BUN 29* 05/18/2013 0832   CREATININE 1.6* 05/18/2013 0832      Component Value Date/Time   CALCIUM 9.6 05/18/2013 0832   ALKPHOS 54 05/18/2013 0832   AST 19 05/18/2013 0832   ALT 16 05/18/2013 0832   BILITOT 0.9 05/18/2013 0832    Water intake - 3 servings a day  Cr is up from 1.3  On ace for renal infection  Does take metformin  Diabetes Home sugar results are very good - and he rarely has low sugar DM diet - good  Exercise - fair  Symptoms A1C last  Lab Results  Component Value Date   HGBA1C 7.3* 05/18/2013  overall stable- down from 7.4  No problems with medications  Renal protection on ace Last eye exam about a year ago and has one upcoming   Wt is stable with good bmi  Hyperlipidemia   on zocor and diet Lab Results  Component Value Date   CHOL 105 05/18/2013   CHOL 105 11/15/2012   CHOL 114 11/17/2011   Lab Results  Component Value Date   HDL 38.40* 05/18/2013   HDL 41.00 11/15/2012   HDL 46.70 11/17/2011   Lab Results  Component Value Date   LDLCALC 46 05/18/2013   LDLCALC 34 11/15/2012   LDLCALC 51 11/17/2011   Lab Results  Component Value Date   TRIG 102.0 05/18/2013   TRIG 150.0* 11/15/2012   TRIG 81.0 11/17/2011   Lab Results  Component Value Date   CHOLHDL 3 05/18/2013   CHOLHDL 3 11/15/2012   CHOLHDL 2 11/17/2011   No results found for this basename: LDLDIRECT     Patient Active Problem List   Diagnosis Date Noted  . Renal insufficiency  05/24/2013  . Anemia 11/23/2012  . Abnormal TSH 11/23/2012  . Hypothyroid 11/23/2012  . GERD 11/29/2010  . CORONARY ARTERY DISEASE 04/18/2007  . DIABETES MELLITUS, TYPE II 04/15/2007  . HYPERLIPIDEMIA 04/15/2007  . HYPERTENSION 04/15/2007  . MYOCARDIAL INFARCTION, HX OF 04/15/2007  . ACTINIC KERATOSIS 04/15/2007   Past Medical History  Diagnosis Date  . Diabetes mellitus     type II  . Hyperlipidemia   . Hypertension   . CAD (coronary artery disease)   . Colon polyp   . Kidney stones   . Neuropathy    Past Surgical History  Procedure Laterality Date  . Appendectomy  1984  . Cataract extraction  04/2001  . Cholecystectomy  1989  . Retinal detachment surgery  2003  . Shoulder surgery  2012    Lt shoulder repair  . Colonoscopy N/A 2/14    hemorrhoids and tics (after pos ifob card)  . Esophagogastroduodenoscopy N/A 2/14   History  Substance Use Topics  . Smoking status: Never Smoker   . Smokeless tobacco: Not on file  . Alcohol Use: No   Family History  Problem Relation Age of Onset  . Cancer Brother     prostate CA   Allergies  Allergen Reactions  . Azithromycin     REACTION: nausea   Current Outpatient Prescriptions on File Prior to Visit  Medication Sig Dispense Refill  . acetaminophen (TYLENOL ARTHRITIS PAIN) 650 MG CR tablet Take 650 mg by mouth every 8 (eight) hours as needed.        Marland Kitchen amLODipine (NORVASC) 5 MG tablet Take 1 tablet (5 mg total) by mouth daily.  90 tablet  3  . aspirin 81 MG tablet Take 81 mg by mouth daily.        Marland Kitchen glimepiride (AMARYL) 4 MG tablet Take 1 tablet (4 mg total) by mouth 2 (two) times daily. 1 by mouth in am and 1 pill by mouth in pm  60 tablet  11  . glucose blood test strip 1 each by Other route 2 (two) times daily as needed. Use as instructed       . guanFACINE (TENEX) 1 MG tablet Take one by mouth daily at bedtime  90 tablet  1  . iron polysaccharides (NIFEREX) 150 MG capsule Take 1 capsule (150 mg total) by mouth 2 (two)  times daily.  30 capsule  3  . lisinopril (PRINIVIL,ZESTRIL) 40 MG tablet Take 1 tablet (40 mg total) by mouth daily.  90 tablet  3  . magnesium oxide (MAG-OX) 400 MG tablet Take 400 mg by mouth daily.        . metFORMIN (GLUCOPHAGE) 1000 MG tablet Take 1 tablet (1,000 mg total) by mouth 2 (two) times daily with a meal.  180 tablet  3  . Multiple Minerals-Vitamins (CITRACAL PLUS) TABS Take 1 tablet by mouth daily.        . Multiple Vitamin (MULTIVITAMIN) capsule Take 1 capsule by mouth daily.        . Omega-3 Fatty Acids (FISH OIL PO) Take 2 capsules by mouth daily.        Marland Kitchen omeprazole (PRILOSEC OTC) 20 MG tablet Take 1 tablet (20 mg total) by mouth daily.  90 tablet  3  . simvastatin (ZOCOR) 40 MG tablet Take 0.5 tablets (20 mg total) by mouth daily.  45 tablet  1  . Tamsulosin HCl (FLOMAX) 0.4 MG CAPS Take 0.4 mg by mouth daily.         No current facility-administered medications on file prior to visit.    Review of Systems Review of Systems  Constitutional: Negative for fever, appetite change, fatigue and unexpected weight change.  Eyes: Negative for pain and visual disturbance.  Respiratory: Negative for cough and shortness of breath.   Cardiovascular: Negative for cp or palpitations    Gastrointestinal: Negative for nausea, diarrhea and constipation.  Genitourinary: Negative for urgency and frequency.  Skin: Negative for pallor or rash   Neurological: Negative for weakness, light-headedness, numbness and headaches.  Hematological: Negative for adenopathy. Does not bruise/bleed easily.  Psychiatric/Behavioral: Negative for dysphoric mood. The patient is not nervous/anxious.         Objective:   Physical Exam  Constitutional: He appears well-developed and well-nourished. No distress.  HENT:  Head: Normocephalic and atraumatic.  Mouth/Throat: Oropharynx is clear and moist.  Eyes: Conjunctivae and EOM are normal. Pupils are equal, round, and reactive to light. Right eye exhibits  no discharge. Left eye exhibits no discharge. No scleral icterus.  Neck: Normal range of motion. Neck supple. No JVD present. Carotid bruit is not present. No thyromegaly present.  Cardiovascular: Normal rate, regular rhythm, normal heart sounds and intact distal pulses.  Exam reveals no gallop.   Pulmonary/Chest: Effort normal and breath sounds normal. No respiratory distress. He has no wheezes. He has no rales.  No crackles   Abdominal: Soft. Bowel sounds are normal. He exhibits no distension, no abdominal bruit and no mass. There is no tenderness.  Musculoskeletal: He exhibits no edema and no tenderness.  Lymphadenopathy:    He has no cervical adenopathy.  Neurological: He is alert. He has normal reflexes. No cranial nerve deficit. He exhibits normal muscle tone. Coordination normal.  Skin: Skin is warm and dry. No rash noted. No erythema. No pallor.  Psychiatric: He has a normal mood and affect.          Assessment & Plan:

## 2013-05-24 NOTE — Assessment & Plan Note (Signed)
a1c is stable - above 7 but cannot inc med due to risk of hypoglycemia For opthy this mo  Some renal insuff- may need to change metformin in future if no imp with hydration

## 2013-05-26 ENCOUNTER — Telehealth: Payer: Self-pay | Admitting: Family Medicine

## 2013-05-26 DIAGNOSIS — R3129 Other microscopic hematuria: Secondary | ICD-10-CM | POA: Insufficient documentation

## 2013-05-26 DIAGNOSIS — N289 Disorder of kidney and ureter, unspecified: Secondary | ICD-10-CM

## 2013-05-26 NOTE — Telephone Encounter (Signed)
Message copied by Judy Pimple on Thu May 26, 2013  2:59 PM ------      Message from: Patience Musca      Created: Thu May 26, 2013  1:09 PM       Patient notified as instructed by telephone. U/A added to lab already scheduled. Pt is agreeable to get renal ultrasound and will wait to hear from pt care coordinator about appt. ------

## 2013-05-26 NOTE — Telephone Encounter (Signed)
Ref for renal US

## 2013-05-27 ENCOUNTER — Other Ambulatory Visit: Payer: Self-pay | Admitting: *Deleted

## 2013-05-27 MED ORDER — OMEPRAZOLE MAGNESIUM 20 MG PO TBEC: 20.0000 mg | DELAYED_RELEASE_TABLET | Freq: Every day | ORAL | Status: DC

## 2013-06-01 ENCOUNTER — Ambulatory Visit: Payer: Self-pay | Admitting: Family Medicine

## 2013-06-02 ENCOUNTER — Encounter: Payer: Self-pay | Admitting: Family Medicine

## 2013-06-10 ENCOUNTER — Ambulatory Visit: Payer: Self-pay | Admitting: Urology

## 2013-06-21 ENCOUNTER — Other Ambulatory Visit: Payer: Self-pay | Admitting: Family Medicine

## 2013-06-21 DIAGNOSIS — R319 Hematuria, unspecified: Secondary | ICD-10-CM

## 2013-06-21 MED ORDER — GLIMEPIRIDE 4 MG PO TABS: 4.0000 mg | ORAL_TABLET | Freq: Two times a day (BID) | ORAL | Status: DC

## 2013-06-23 ENCOUNTER — Other Ambulatory Visit (INDEPENDENT_AMBULATORY_CARE_PROVIDER_SITE_OTHER): Payer: Medicare Other

## 2013-06-23 DIAGNOSIS — N289 Disorder of kidney and ureter, unspecified: Secondary | ICD-10-CM

## 2013-06-23 DIAGNOSIS — R319 Hematuria, unspecified: Secondary | ICD-10-CM

## 2013-06-23 LAB — RENAL FUNCTION PANEL
Albumin: 3.9 g/dL (ref 3.5–5.2)
CO2: 26 mEq/L (ref 19–32)
Calcium: 10 mg/dL (ref 8.4–10.5)
Chloride: 110 mEq/L (ref 96–112)
Glucose, Bld: 107 mg/dL — ABNORMAL HIGH (ref 70–99)
Potassium: 5.2 mEq/L — ABNORMAL HIGH (ref 3.5–5.1)
Sodium: 143 mEq/L (ref 135–145)

## 2013-06-23 LAB — URINALYSIS, ROUTINE W REFLEX MICROSCOPIC
Hgb urine dipstick: NEGATIVE
Nitrite: NEGATIVE
RBC / HPF: NONE SEEN (ref 0–?)
Specific Gravity, Urine: 1.015 (ref 1.000–1.030)
Total Protein, Urine: NEGATIVE
WBC, UA: NONE SEEN (ref 0–?)

## 2013-06-28 ENCOUNTER — Telehealth: Payer: Self-pay | Admitting: *Deleted

## 2013-06-28 NOTE — Telephone Encounter (Signed)
CT results received and placed in your inbox

## 2013-06-28 NOTE — Telephone Encounter (Signed)
CT shows some kidney cysts (not uncommon) as well as some small stones in the kidney (also not uncommon)- we can review in more detail when he follows up  Was this part of an ER visit or urology records? - please send for any pertinent notes, thanks

## 2013-06-28 NOTE — Telephone Encounter (Signed)
Pt's wife notified of CT results and said pt has a standing order for a CT every year with Dr. Achilles Dunk, Dr. Achilles Dunk said that there was no change in his CT results and pt's wife wanted to know why does pt need to f/u with you if Dr. Achilles Dunk said everything was stable, please advise

## 2013-06-28 NOTE — Telephone Encounter (Signed)
Wife left voicemail saying that pt had a CT done at [facility] on 06/10/13 and they would like Dr. Royden Purl opinion on it, I faxed over request to [facility] for CT results

## 2013-06-28 NOTE — Telephone Encounter (Signed)
He has an appt later this month for labs and follow up with me to assess kidney function (creatinine) after changing medicines (see last lab note)  That is what I was referring to

## 2013-06-29 NOTE — Telephone Encounter (Signed)
Left voicemail letting pt know that Dr. Milinda Antis does want him to keep his appt so she can assess kidney function (creatinine) after changing medicines

## 2013-07-11 ENCOUNTER — Other Ambulatory Visit (INDEPENDENT_AMBULATORY_CARE_PROVIDER_SITE_OTHER): Payer: Medicare Other

## 2013-07-11 DIAGNOSIS — N289 Disorder of kidney and ureter, unspecified: Secondary | ICD-10-CM

## 2013-07-11 LAB — RENAL FUNCTION PANEL
Albumin: 3.9 g/dL (ref 3.5–5.2)
BUN: 23 mg/dL (ref 6–23)
CO2: 27 mEq/L (ref 19–32)
Calcium: 9.8 mg/dL (ref 8.4–10.5)
Chloride: 107 mEq/L (ref 96–112)
Creatinine, Ser: 1.5 mg/dL (ref 0.4–1.5)

## 2013-07-13 ENCOUNTER — Encounter: Payer: Self-pay | Admitting: Family Medicine

## 2013-07-13 ENCOUNTER — Ambulatory Visit (INDEPENDENT_AMBULATORY_CARE_PROVIDER_SITE_OTHER): Payer: Medicare Other | Admitting: Family Medicine

## 2013-07-13 VITALS — BP 108/56 | HR 62 | Temp 98.1°F | Ht 67.0 in | Wt 144.0 lb

## 2013-07-13 DIAGNOSIS — N289 Disorder of kidney and ureter, unspecified: Secondary | ICD-10-CM

## 2013-07-13 DIAGNOSIS — E119 Type 2 diabetes mellitus without complications: Secondary | ICD-10-CM

## 2013-07-13 DIAGNOSIS — I10 Essential (primary) hypertension: Secondary | ICD-10-CM

## 2013-07-13 MED ORDER — LISINOPRIL 40 MG PO TABS: 20.0000 mg | ORAL_TABLET | Freq: Every day | ORAL | Status: DC

## 2013-07-13 NOTE — Patient Instructions (Addendum)
Go back on the metformin since holding it did not make much of a difference with kidney function and also eat a healthy diet  Watch out for low sugars  Drink lots of water to protect kidneys  Be very careful to prevent falls - if you feel unsteady please use your walker  Schedule non fasting lab in early September and then follow up  Avoid over the counter medicines unless you check with [place]

## 2013-07-13 NOTE — Progress Notes (Signed)
Subjective:    Patient ID: LATRELL POTEMPA, male    DOB: September 22, 1926, 79 y.o.   MRN: [id number]  HPI Here for f/u of chronic renal insuff as well as other problems  Last time we had lab- cr reached 1.6 Then asked pt to hold metformin and cut ace in 1/2   New labs   Chemistry      Component Value Date/Time   NA 142 07/11/2013 0905   K 4.7 07/11/2013 0905   CL 107 07/11/2013 0905   CO2 27 07/11/2013 0905   BUN 23 07/11/2013 0905   CREATININE 1.5 07/11/2013 0905      Component Value Date/Time   CALCIUM 9.8 07/11/2013 0905   ALKPHOS 54 05/18/2013 0832   AST 19 05/18/2013 0832   ALT 16 05/18/2013 0832   BILITOT 0.9 05/18/2013 0832      Sugar control off his metformin Lab Results  Component Value Date   HGBA1C 7.3* 05/18/2013   sugars are running around 200 or more  Went back on it after his blood work yesterday  bp is stable today  No cp or palpitations or headaches or edema  No side effects to medicines  BP Readings from Last 3 Encounters:  07/13/13 108/56  05/24/13 124/68  02/11/13 112/60   he feels dizzy a whole lot - thinks it is from his bp     Med list from his cardiologist listed celebrex --he is not taking it   Wt is down 3 lb with bmi of 22  Pending heme consult for anemia  Lab Results  Component Value Date   WBC 6.1 05/18/2013   HGB 11.7* 05/18/2013   HCT 34.9* 05/18/2013   MCV 96.5 05/18/2013   PLT 232.0 05/18/2013     Patient Active Problem List   Diagnosis Date Noted  . Hematuria, microscopic 05/26/2013  . Renal insufficiency 05/24/2013  . Anemia 11/23/2012  . Abnormal TSH 11/23/2012  . Hypothyroid 11/23/2012  . GERD 11/29/2010  . CORONARY ARTERY DISEASE 04/18/2007  . DIABETES MELLITUS, TYPE II 04/15/2007  . HYPERLIPIDEMIA 04/15/2007  . HYPERTENSION 04/15/2007  . MYOCARDIAL INFARCTION, HX OF 04/15/2007  . ACTINIC KERATOSIS 04/15/2007   Past Medical History  Diagnosis Date  . Diabetes mellitus     type II  . Hyperlipidemia   . Hypertension   .  CAD (coronary artery disease)   . Colon polyp   . Kidney stones   . Neuropathy    Past Surgical History  Procedure Laterality Date  . Appendectomy  1984  . Cataract extraction  04/2001  . Cholecystectomy  1989  . Retinal detachment surgery  2003  . Shoulder surgery  2012    Lt shoulder repair  . Colonoscopy N/A 2/14    hemorrhoids and tics (after pos ifob card)  . Esophagogastroduodenoscopy N/A 2/14   History  Substance Use Topics  . Smoking status: Never Smoker   . Smokeless tobacco: Not on file  . Alcohol Use: No   Family History  Problem Relation Age of Onset  . Cancer Brother     prostate CA   Allergies  Allergen Reactions  . Azithromycin     REACTION: nausea   Current Outpatient Prescriptions on File Prior to Visit  Medication Sig Dispense Refill  . acetaminophen (TYLENOL ARTHRITIS PAIN) 650 MG CR tablet Take 650 mg by mouth every 8 (eight) hours as needed.        Marland Kitchen amLODipine (NORVASC) 5 MG tablet Take 1 tablet (5 mg  total) by mouth daily.  90 tablet  3  . aspirin 81 MG tablet Take 81 mg by mouth daily.        Marland Kitchen glimepiride (AMARYL) 4 MG tablet Take 1 tablet (4 mg total) by mouth 2 (two) times daily. 1 by mouth in am and 1 pill by mouth in pm  60 tablet  5  . glucose blood test strip 1 each by Other route 2 (two) times daily as needed. Use as instructed       . guanFACINE (TENEX) 1 MG tablet Take one by mouth daily at bedtime  90 tablet  1  . iron polysaccharides (NIFEREX) 150 MG capsule Take 1 capsule (150 mg total) by mouth 2 (two) times daily.  30 capsule  3  . lisinopril (PRINIVIL,ZESTRIL) 40 MG tablet Take 1 tablet (40 mg total) by mouth daily.  90 tablet  3  . magnesium oxide (MAG-OX) 400 MG tablet Take 400 mg by mouth daily.        . metFORMIN (GLUCOPHAGE) 1000 MG tablet Take 1 tablet (1,000 mg total) by mouth 2 (two) times daily with a meal.  180 tablet  3  . Multiple Minerals-Vitamins (CITRACAL PLUS) TABS Take 1 tablet by mouth daily.        . Multiple  Vitamin (MULTIVITAMIN) capsule Take 1 capsule by mouth daily.        . Omega-3 Fatty Acids (FISH OIL PO) Take 2 capsules by mouth daily.        Marland Kitchen omeprazole (PRILOSEC OTC) 20 MG tablet Take 1 tablet (20 mg total) by mouth daily.  90 tablet  1  . simvastatin (ZOCOR) 40 MG tablet Take 0.5 tablets (20 mg total) by mouth daily.  45 tablet  1  . Tamsulosin HCl (FLOMAX) 0.4 MG CAPS Take 0.4 mg by mouth daily.         No current facility-administered medications on file prior to visit.    Review of Systems Review of Systems  Constitutional: Negative for fever, appetite change, fatigue and unexpected weight change.  Eyes: Negative for pain and visual disturbance.  Respiratory: Negative for cough and shortness of breath.   Cardiovascular: Negative for cp or palpitations    Gastrointestinal: Negative for nausea, diarrhea and constipation.  Genitourinary: Negative for urgency and frequency.  Skin: Negative for pallor or rash   Neurological: Negative for weakness, , numbness and headaches. at times is light headed- outdoors if hot  Hematological: Negative for adenopathy. Does not bruise/bleed easily.  Psychiatric/Behavioral: Negative for dysphoric mood. The patient is not nervous/anxious.         Objective:   Physical Exam  Constitutional: He appears well-developed and well-nourished. No distress.  Slim elderly male no distress  HENT:  Head: Normocephalic and atraumatic.  Mouth/Throat: Oropharynx is clear and moist.  Eyes: Conjunctivae and EOM are normal. Pupils are equal, round, and reactive to light. Right eye exhibits no discharge. Left eye exhibits no discharge. No scleral icterus.  Neck: Normal range of motion. Neck supple. No JVD present. Carotid bruit is not present. No thyromegaly present.  Cardiovascular: Normal rate, regular rhythm, normal heart sounds and intact distal pulses.  Exam reveals no gallop.   Pulmonary/Chest: Effort normal and breath sounds normal. No respiratory distress.  He has no wheezes. He has no rales.  No crackles  Abdominal: Soft. Bowel sounds are normal. He exhibits no distension, no abdominal bruit and no mass. There is no tenderness.  Musculoskeletal: He exhibits no edema.  Lymphadenopathy:  He has no cervical adenopathy.  Neurological: He has normal reflexes. No cranial nerve deficit. He exhibits normal muscle tone. Coordination normal.  Skin: Skin is warm and dry. No rash noted. No pallor.  Psychiatric: He has a normal mood and affect.          Assessment & Plan:

## 2013-07-14 NOTE — Assessment & Plan Note (Signed)
This is stable with cr of 1.5-1.6 No significant change if he holds metformin or cuts dose with ace Likely multifactorial with hx of DM/ HTN and vasc dz Would consider renal ref if no further imp in future - will re check this in about 2 mo  inst to drink water and also avoid otc meds/ esp nsaids  No urinary symptoms and he continues to see urology

## 2013-07-14 NOTE — Assessment & Plan Note (Signed)
Sugar went up significantly off metformin and cr did not change much  Pt was very distressed by this and strongly opposes more expensive med or injectable med for DM Diet is very good  Lab and f/u 2 mo

## 2013-07-14 NOTE — Assessment & Plan Note (Addendum)
bp in fair control at this time  No changes needed  On 1/2 of his ace -no change / in fact bp is on the low side Disc lifstyle change with low sodium diet and exercise   Lab reviewed

## 2013-07-22 ENCOUNTER — Ambulatory Visit: Payer: Self-pay | Admitting: Internal Medicine

## 2013-07-26 LAB — CBC CANCER CENTER
Basophil #: 0 x10 3/mm (ref 0.0–0.1)
Eosinophil %: 8.4 %
HGB: 12.5 g/dL — ABNORMAL LOW (ref 13.0–18.0)
Lymphocyte #: 1.5 x10 3/mm (ref 1.0–3.6)
Lymphocyte %: 19.1 %
MCH: 33.4 pg (ref 26.0–34.0)
MCHC: 34.1 g/dL (ref 32.0–36.0)
Monocyte %: 6.1 %
Neutrophil #: 5.3 x10 3/mm (ref 1.4–6.5)
RDW: 14.3 % (ref 11.5–14.5)

## 2013-07-26 LAB — IRON AND TIBC
Iron Bind.Cap.(Total): 283 ug/dL (ref 250–450)
Iron Saturation: 46 %
Iron: 129 ug/dL (ref 65–175)
Unbound Iron-Bind.Cap.: 154 ug/dL

## 2013-07-26 LAB — FERRITIN: Ferritin (ARMC): 24 ng/mL (ref 8–388)

## 2013-08-22 ENCOUNTER — Ambulatory Visit: Payer: Self-pay | Admitting: Internal Medicine

## 2013-09-01 ENCOUNTER — Other Ambulatory Visit (INDEPENDENT_AMBULATORY_CARE_PROVIDER_SITE_OTHER): Payer: Medicare Other

## 2013-09-01 DIAGNOSIS — E119 Type 2 diabetes mellitus without complications: Secondary | ICD-10-CM

## 2013-09-01 DIAGNOSIS — L570 Actinic keratosis: Secondary | ICD-10-CM

## 2013-09-01 DIAGNOSIS — N289 Disorder of kidney and ureter, unspecified: Secondary | ICD-10-CM

## 2013-09-01 DIAGNOSIS — D649 Anemia, unspecified: Secondary | ICD-10-CM

## 2013-09-01 DIAGNOSIS — I10 Essential (primary) hypertension: Secondary | ICD-10-CM

## 2013-09-01 LAB — COMPREHENSIVE METABOLIC PANEL WITH GFR
ALT: 18 U/L (ref 0–53)
AST: 19 U/L (ref 0–37)
Albumin: 3.8 g/dL (ref 3.5–5.2)
Alkaline Phosphatase: 51 U/L (ref 39–117)
BUN: 27 mg/dL — ABNORMAL HIGH (ref 6–23)
CO2: 25 meq/L (ref 19–32)
Calcium: 9.6 mg/dL (ref 8.4–10.5)
Chloride: 109 meq/L (ref 96–112)
Creatinine, Ser: 1.4 mg/dL (ref 0.4–1.5)
GFR: 49.54 mL/min — ABNORMAL LOW
Glucose, Bld: 168 mg/dL — ABNORMAL HIGH (ref 70–99)
Potassium: 4.8 meq/L (ref 3.5–5.1)
Sodium: 139 meq/L (ref 135–145)
Total Bilirubin: 0.8 mg/dL (ref 0.3–1.2)
Total Protein: 6.6 g/dL (ref 6.0–8.3)

## 2013-09-01 LAB — CBC WITH DIFFERENTIAL/PLATELET
Basophils Relative: 0.4 % (ref 0.0–3.0)
Eosinophils Absolute: 0.3 10*3/uL (ref 0.0–0.7)
Lymphocytes Relative: 24.2 % (ref 12.0–46.0)
MCHC: 33.3 g/dL (ref 30.0–36.0)
Monocytes Relative: 6.9 % (ref 3.0–12.0)
Neutrophils Relative %: 63.8 % (ref 43.0–77.0)
RBC: 3.62 Mil/uL — ABNORMAL LOW (ref 4.22–5.81)
WBC: 6.3 10*3/uL (ref 4.5–10.5)

## 2013-09-01 LAB — HEMOGLOBIN A1C: Hgb A1c MFr Bld: 7.7 % — ABNORMAL HIGH (ref 4.6–6.5)

## 2013-09-05 ENCOUNTER — Ambulatory Visit (INDEPENDENT_AMBULATORY_CARE_PROVIDER_SITE_OTHER): Payer: Medicare Other | Admitting: Family Medicine

## 2013-09-05 ENCOUNTER — Encounter: Payer: Self-pay | Admitting: Family Medicine

## 2013-09-05 VITALS — BP 116/78 | HR 61 | Temp 97.7°F | Ht 67.0 in | Wt 147.2 lb

## 2013-09-05 DIAGNOSIS — E119 Type 2 diabetes mellitus without complications: Secondary | ICD-10-CM

## 2013-09-05 DIAGNOSIS — N289 Disorder of kidney and ureter, unspecified: Secondary | ICD-10-CM

## 2013-09-05 DIAGNOSIS — I10 Essential (primary) hypertension: Secondary | ICD-10-CM

## 2013-09-05 MED ORDER — SITAGLIPTIN PHOSPHATE 100 MG PO TABS: 100.0000 mg | ORAL_TABLET | Freq: Every day | ORAL | Status: DC

## 2013-09-05 NOTE — Patient Instructions (Addendum)
Start checking glucose more often in afternoon and PM  See if David Henry is covered and start it if you can (if it is not affordable let me know ) If any side effects or problems let me know  I'm glad you had your flu shot  Follow up in 3 months with labs prior

## 2013-09-05 NOTE — Assessment & Plan Note (Signed)
Multifactorial Imp today with cr of 1.4  No symptoms  Bun is 27- reminded about good water intake Holding metformin and ace in the past made no difference

## 2013-09-05 NOTE — Progress Notes (Signed)
Subjective:    Patient ID: David Henry, male    DOB: [birth year], 79 y.o.   MRN: [id number]  HPI Here for f/u of chronic medical problems   Wt is up 3 lb with bmi of 23  Cr is 1.4- this is improved  bp is stable today  No cp or palpitations or headaches or edema  No side effects to medicines  BP Readings from Last 3 Encounters:  09/05/13 116/78  07/13/13 108/56  05/24/13 124/68     Diabetes Home sugar results - has been the same in am 90-110, and does not check afternoons about 190 DM diet -diet is still very good - sticks with DM diet  Exercise gets at least 20-30 min per day - working in the yard  Symptoms A1C last  Lab Results  Component Value Date   HGBA1C 7.7* 09/01/2013  this is up from 7.3   No problems with medications  Renal protection- on ace  Last eye exam - was in the summer  Flu vaccine - got recently at [facility]    Patient Active Problem List   Diagnosis Date Noted  . Hematuria, microscopic 05/26/2013  . Renal insufficiency 05/24/2013  . Anemia 11/23/2012  . Abnormal TSH 11/23/2012  . Hypothyroid 11/23/2012  . GERD 11/29/2010  . CORONARY ARTERY DISEASE 04/18/2007  . DIABETES MELLITUS, TYPE II 04/15/2007  . HYPERLIPIDEMIA 04/15/2007  . HYPERTENSION 04/15/2007  . MYOCARDIAL INFARCTION, HX OF 04/15/2007  . ACTINIC KERATOSIS 04/15/2007   Past Medical History  Diagnosis Date  . Diabetes mellitus     type II  . Hyperlipidemia   . Hypertension   . CAD (coronary artery disease)   . Colon polyp   . Kidney stones   . Neuropathy    Past Surgical History  Procedure Laterality Date  . Appendectomy  1984  . Cataract extraction  04/2001  . Cholecystectomy  1989  . Retinal detachment surgery  2003  . Shoulder surgery  2012    Lt shoulder repair  . Colonoscopy N/A 2/14    hemorrhoids and tics (after pos ifob card)  . Esophagogastroduodenoscopy N/A 2/14   History  Substance Use Topics  . Smoking status: Never Smoker   . Smokeless tobacco:  Not on file  . Alcohol Use: No   Family History  Problem Relation Age of Onset  . Cancer Brother     prostate CA   Allergies  Allergen Reactions  . Azithromycin     REACTION: nausea   Current Outpatient Prescriptions on File Prior to Visit  Medication Sig Dispense Refill  . acetaminophen (TYLENOL ARTHRITIS PAIN) 650 MG CR tablet Take 650 mg by mouth every 8 (eight) hours as needed.        Marland Kitchen amLODipine (NORVASC) 5 MG tablet Take 1 tablet (5 mg total) by mouth daily.  90 tablet  3  . aspirin 81 MG tablet Take 81 mg by mouth daily.        Marland Kitchen glimepiride (AMARYL) 4 MG tablet Take 1 tablet (4 mg total) by mouth 2 (two) times daily. 1 by mouth in am and 1 pill by mouth in pm  60 tablet  5  . glucose blood test strip 1 each by Other route 2 (two) times daily as needed. Use as instructed       . guanFACINE (TENEX) 1 MG tablet Take one by mouth daily at bedtime  90 tablet  1  . iron polysaccharides (NIFEREX) 150 MG capsule Take 1 capsule (  150 mg total) by mouth 2 (two) times daily.  30 capsule  3  . lisinopril (PRINIVIL,ZESTRIL) 40 MG tablet Take 0.5 tablets (20 mg total) by mouth daily.  45 tablet  3  . magnesium oxide (MAG-OX) 400 MG tablet Take 400 mg by mouth daily.        . metFORMIN (GLUCOPHAGE) 1000 MG tablet Take 1 tablet (1,000 mg total) by mouth 2 (two) times daily with a meal.  180 tablet  3  . Multiple Minerals-Vitamins (CITRACAL PLUS) TABS Take 1 tablet by mouth daily.        . Multiple Vitamin (MULTIVITAMIN) capsule Take 1 capsule by mouth daily.        . Omega-3 Fatty Acids (FISH OIL PO) Take 2 capsules by mouth daily.        Marland Kitchen omeprazole (PRILOSEC OTC) 20 MG tablet Take 1 tablet (20 mg total) by mouth daily.  90 tablet  1  . simvastatin (ZOCOR) 40 MG tablet Take 0.5 tablets (20 mg total) by mouth daily.  45 tablet  1  . Tamsulosin HCl (FLOMAX) 0.4 MG CAPS Take 0.4 mg by mouth daily.         No current facility-administered medications on file prior to visit.     Review of  Systems Review of Systems  Constitutional: Negative for fever, appetite change, fatigue and unexpected weight change.  Eyes: Negative for pain and visual disturbance.  Respiratory: Negative for cough and shortness of breath.   Cardiovascular: Negative for cp or palpitations    Gastrointestinal: Negative for nausea, diarrhea and constipation.  Genitourinary: Negative for urgency and frequency.  Skin: Negative for pallor or rash   Neurological: Negative for weakness, light-headedness, numbness and headaches.  Hematological: Negative for adenopathy. Does not bruise/bleed easily.  Psychiatric/Behavioral: Negative for dysphoric mood. The patient is not nervous/anxious.         Objective:   Physical Exam  Constitutional: He appears well-developed and well-nourished. No distress.  HENT:  Head: Normocephalic and atraumatic.  Mouth/Throat: Oropharynx is clear and moist.  Eyes: Conjunctivae and EOM are normal. Pupils are equal, round, and reactive to light. Right eye exhibits no discharge. Left eye exhibits no discharge. No scleral icterus.  Neck: Normal range of motion. Neck supple. No JVD present. Carotid bruit is not present. No thyromegaly present.  Cardiovascular: Normal rate, regular rhythm, normal heart sounds and intact distal pulses.  Exam reveals no gallop.   Pulmonary/Chest: Effort normal and breath sounds normal. No respiratory distress. He has no wheezes. He has no rales.  Abdominal: Soft. Bowel sounds are normal. He exhibits no distension, no abdominal bruit and no mass. There is no tenderness.  Musculoskeletal: He exhibits no edema and no tenderness.  Lymphadenopathy:    He has no cervical adenopathy.  Neurological: He is alert. He has normal reflexes. No cranial nerve deficit. He exhibits normal muscle tone. Coordination normal.  Skin: Skin is warm and dry. No rash noted. No erythema. No pallor.  Psychiatric: He has a normal mood and affect.          Assessment & Plan:

## 2013-09-05 NOTE — Assessment & Plan Note (Signed)
Worse with A1C of 7.7 despite good diet and activity level  Will add Januvia if covered  inst to check bid glucose - esp in pm/ pp - I expect those are higher  Enc good habits  No symptoms

## 2013-09-05 NOTE — Assessment & Plan Note (Signed)
bp in fair control at this time  No changes needed  Disc lifstyle change with low sodium diet and exercise  Labs reviewed  

## 2013-09-16 ENCOUNTER — Other Ambulatory Visit: Payer: Self-pay | Admitting: Family Medicine

## 2013-09-16 MED ORDER — GUANFACINE HCL 1 MG PO TABS: ORAL_TABLET | ORAL | Status: DC

## 2013-11-18 ENCOUNTER — Other Ambulatory Visit: Payer: Self-pay | Admitting: *Deleted

## 2013-11-18 MED ORDER — AMLODIPINE BESYLATE 5 MG PO TABS: 5.0000 mg | ORAL_TABLET | Freq: Every day | ORAL | Status: DC

## 2013-12-01 ENCOUNTER — Other Ambulatory Visit (INDEPENDENT_AMBULATORY_CARE_PROVIDER_SITE_OTHER): Payer: Medicare Other

## 2013-12-01 DIAGNOSIS — N289 Disorder of kidney and ureter, unspecified: Secondary | ICD-10-CM

## 2013-12-01 DIAGNOSIS — E119 Type 2 diabetes mellitus without complications: Secondary | ICD-10-CM

## 2013-12-01 DIAGNOSIS — I10 Essential (primary) hypertension: Secondary | ICD-10-CM

## 2013-12-01 LAB — COMPREHENSIVE METABOLIC PANEL
ALT: 18 U/L (ref 0–53)
AST: 17 U/L (ref 0–37)
Albumin: 4 g/dL (ref 3.5–5.2)
BUN: 27 mg/dL — ABNORMAL HIGH (ref 6–23)
CO2: 26 mEq/L (ref 19–32)
Calcium: 9.6 mg/dL (ref 8.4–10.5)
Chloride: 109 mEq/L (ref 96–112)
GFR: 44.81 mL/min — ABNORMAL LOW (ref >60.00)
Potassium: 4.8 mEq/L (ref 3.5–5.1)

## 2013-12-01 LAB — LIPID PANEL
Cholesterol: 117 mg/dL (ref 0–200)
LDL Cholesterol: 38 mg/dL (ref 0–99)
Total CHOL/HDL Ratio: 3

## 2013-12-06 ENCOUNTER — Ambulatory Visit (INDEPENDENT_AMBULATORY_CARE_PROVIDER_SITE_OTHER): Payer: Medicare Other | Admitting: Family Medicine

## 2013-12-06 ENCOUNTER — Encounter: Payer: Self-pay | Admitting: Family Medicine

## 2013-12-06 VITALS — BP 128/64 | HR 66 | Temp 97.9°F | Ht 67.0 in | Wt 148.2 lb

## 2013-12-06 DIAGNOSIS — E785 Hyperlipidemia, unspecified: Secondary | ICD-10-CM

## 2013-12-06 DIAGNOSIS — I10 Essential (primary) hypertension: Secondary | ICD-10-CM

## 2013-12-06 DIAGNOSIS — E119 Type 2 diabetes mellitus without complications: Secondary | ICD-10-CM

## 2013-12-06 DIAGNOSIS — N289 Disorder of kidney and ureter, unspecified: Secondary | ICD-10-CM

## 2013-12-06 MED ORDER — OMEPRAZOLE MAGNESIUM 20 MG PO TBEC: 20.0000 mg | DELAYED_RELEASE_TABLET | Freq: Every day | ORAL | Status: DC

## 2013-12-06 MED ORDER — LISINOPRIL 40 MG PO TABS: 20.0000 mg | ORAL_TABLET | Freq: Every day | ORAL | Status: DC

## 2013-12-06 MED ORDER — GUANFACINE HCL 1 MG PO TABS: ORAL_TABLET | ORAL | Status: DC

## 2013-12-06 NOTE — Progress Notes (Signed)
Subjective:    Patient ID: David Henry, male    DOB: 09/05/1926, 79 y.o.   MRN: [id number]  HPI Here for f/u of chronic medical problems   Wt is up 1 lb with bmi of 23   Diabetes Home sugar results - his sugars have been up and down depending on activity- 90-110 every am , -- more variable in pm - but improved (a few low sugars- when he does not eat) DM diet - good  Exercise - variable  Symptoms A1C last  Lab Results  Component Value Date   HGBA1C 6.5 12/01/2013    No problems with medications - on januvia now -no problems  Renal protection- on ace  Last eye exam  7/14    Renal insuff    Chemistry      Component Value Date/Time   NA 141 12/01/2013 0838   K 4.8 12/01/2013 0838   CL 109 12/01/2013 0838   CO2 26 12/01/2013 0838   BUN 27* 12/01/2013 0838   CREATININE 1.6* 12/01/2013 0838      Component Value Date/Time   CALCIUM 9.6 12/01/2013 0838   ALKPHOS 47 12/01/2013 0838   AST 17 12/01/2013 0838   ALT 18 12/01/2013 0838   BILITOT 0.9 12/01/2013 0838     (cr is up from 1.4)   He is drinking water with every meal - needs to drink more  Does urinate more at night    bp is stable today  No cp or palpitations or headaches or edema  No side effects to medicines  BP Readings from Last 3 Encounters:  12/06/13 128/64  09/05/13 116/78  07/13/13 108/56      Hyperlipidemia Lab Results  Component Value Date   CHOL 117 12/01/2013   HDL 42.50 12/01/2013   LDLCALC 38 12/01/2013   TRIG 184.0* 12/01/2013   CHOLHDL 3 12/01/2013    Overall fairly controlled   bp is stable today  No cp or palpitations or headaches or edema  No side effects to medicines  BP Readings from Last 3 Encounters:  12/06/13 128/64  09/05/13 116/78  07/13/13 108/56     Patient Active Problem List   Diagnosis Date Noted  . Hematuria, microscopic 05/26/2013  . Renal insufficiency 05/24/2013  . Anemia 11/23/2012  . Abnormal TSH 11/23/2012  . Hypothyroid 11/23/2012  . GERD  11/29/2010  . CORONARY ARTERY DISEASE 04/18/2007  . DIABETES MELLITUS, TYPE II 04/15/2007  . HYPERLIPIDEMIA 04/15/2007  . HYPERTENSION 04/15/2007  . MYOCARDIAL INFARCTION, HX OF 04/15/2007  . ACTINIC KERATOSIS 04/15/2007   Past Medical History  Diagnosis Date  . Diabetes mellitus     type II  . Hyperlipidemia   . Hypertension   . CAD (coronary artery disease)   . Colon polyp   . Kidney stones   . Neuropathy    Past Surgical History  Procedure Laterality Date  . Appendectomy  1984  . Cataract extraction  04/2001  . Cholecystectomy  1989  . Retinal detachment surgery  2003  . Shoulder surgery  2012    Lt shoulder repair  . Colonoscopy N/A 2/14    hemorrhoids and tics (after pos ifob card)  . Esophagogastroduodenoscopy N/A 2/14   History  Substance Use Topics  . Smoking status: Never Smoker   . Smokeless tobacco: Not on file  . Alcohol Use: No   Family History  Problem Relation Age of Onset  . Cancer Brother     prostate CA   Allergies  Allergen Reactions  . Azithromycin     REACTION: nausea   Current Outpatient Prescriptions on File Prior to Visit  Medication Sig Dispense Refill  . acetaminophen (TYLENOL ARTHRITIS PAIN) 650 MG CR tablet Take 650 mg by mouth every 8 (eight) hours as needed.        Marland Kitchen amLODipine (NORVASC) 5 MG tablet Take 1 tablet (5 mg total) by mouth daily.  90 tablet  1  . aspirin 81 MG tablet Take 81 mg by mouth daily.        Marland Kitchen glimepiride (AMARYL) 4 MG tablet Take 1 tablet (4 mg total) by mouth 2 (two) times daily. 1 by mouth in am and 1 pill by mouth in pm  60 tablet  5  . glucose blood test strip 1 each by Other route 2 (two) times daily as needed. Use as instructed       . iron polysaccharides (NIFEREX) 150 MG capsule Take 1 capsule (150 mg total) by mouth 2 (two) times daily.  30 capsule  3  . magnesium oxide (MAG-OX) 400 MG tablet Take 400 mg by mouth daily.        . Multiple Minerals-Vitamins (CITRACAL PLUS) TABS Take 1 tablet by mouth  daily.        . Multiple Vitamin (MULTIVITAMIN) capsule Take 1 capsule by mouth daily.        . Omega-3 Fatty Acids (FISH OIL PO) Take 2 capsules by mouth daily.        . simvastatin (ZOCOR) 40 MG tablet Take 0.5 tablets (20 mg total) by mouth daily.  45 tablet  1  . sitaGLIPtin (JANUVIA) 100 MG tablet Take 1 tablet (100 mg total) by mouth daily.  30 tablet  11  . Tamsulosin HCl (FLOMAX) 0.4 MG CAPS Take 0.4 mg by mouth daily.         No current facility-administered medications on file prior to visit.    Review of Systems Review of Systems  Constitutional: Negative for fever, appetite change, fatigue and unexpected weight change.  Eyes: Negative for pain and visual disturbance.  Respiratory: Negative for cough and shortness of breath.   Cardiovascular: Negative for cp or palpitations    Gastrointestinal: Negative for nausea, diarrhea and constipation.  Genitourinary: Negative for urgency and frequency.  Skin: Negative for pallor or rash   Neurological: Negative for weakness, light-headedness, numbness and headaches.  Hematological: Negative for adenopathy. Does not bruise/bleed easily.  Psychiatric/Behavioral: Negative for dysphoric mood. The patient is not nervous/anxious.         Objective:   Physical Exam  Nursing note and vitals reviewed. Constitutional: He appears well-developed and well-nourished. No distress.  Slim and well appearing   HENT:  Head: Normocephalic and atraumatic.  Right Ear: External ear normal.  Left Ear: External ear normal.  Mouth/Throat: Oropharynx is clear and moist.  Eyes: Conjunctivae and EOM are normal. Pupils are equal, round, and reactive to light. No scleral icterus.  Neck: Normal range of motion. Neck supple. No JVD present. Carotid bruit is not present. No thyromegaly present.  Cardiovascular: Normal rate, regular rhythm, normal heart sounds and intact distal pulses.  Exam reveals no gallop.   Pulmonary/Chest: Effort normal and breath sounds  normal. No respiratory distress. He has no wheezes. He has no rales.  Abdominal: Soft. Bowel sounds are normal. He exhibits no distension, no abdominal bruit and no mass. There is no tenderness.  Musculoskeletal: He exhibits no edema and no tenderness.  Lymphadenopathy:    He  has no cervical adenopathy.  Neurological: He is alert. He has normal reflexes. No cranial nerve deficit. He exhibits normal muscle tone. Coordination normal.  Skin: Skin is warm and dry. No rash noted. No erythema. No pallor.  Psychiatric: He has a normal mood and affect.          Assessment & Plan:

## 2013-12-06 NOTE — Progress Notes (Signed)
Pre-visit discussion using our clinic review tool. No additional management support is needed unless otherwise documented below in the visit note.  

## 2013-12-06 NOTE — Patient Instructions (Signed)
Sugar control is improved  Hold the metformin- to see if we can help kidney function Continue other medicines  Take good care of yourself If any more low sugar results let me know - do not skip meals  Drink more water  Follow up in 3 months with lab prior

## 2013-12-07 NOTE — Assessment & Plan Note (Signed)
Improvement with addn of januvia Cr is up - will hold metformin for now and monitor closely  If low sugars- may need to hold sulfonurea  Good diet and habits Lab and f/u 3 mo

## 2013-12-07 NOTE — Assessment & Plan Note (Signed)
Lab Results  Component Value Date   CREATININE 1.6* 12/01/2013    This is up from 1.4  Hold metformin , increase fluids Will follow closely  Likely multifactorial  Ref to renal if needed

## 2013-12-07 NOTE — Assessment & Plan Note (Signed)
BP: 128/64 mmHg  bp in fair control at this time  No changes needed Disc lifstyle change with low sodium diet and exercise   Lab reviewed

## 2013-12-07 NOTE — Assessment & Plan Note (Signed)
Stable on statin and diet Disc goals for lipids and reasons to control them Rev labs with pt Rev low sat fat diet in detail  

## 2013-12-13 ENCOUNTER — Other Ambulatory Visit: Payer: Self-pay | Admitting: Family Medicine

## 2014-02-27 ENCOUNTER — Other Ambulatory Visit (INDEPENDENT_AMBULATORY_CARE_PROVIDER_SITE_OTHER): Payer: Medicare Other

## 2014-02-27 DIAGNOSIS — E119 Type 2 diabetes mellitus without complications: Secondary | ICD-10-CM

## 2014-02-27 DIAGNOSIS — N289 Disorder of kidney and ureter, unspecified: Secondary | ICD-10-CM

## 2014-02-27 LAB — COMPREHENSIVE METABOLIC PANEL
ALBUMIN: 4 g/dL (ref 3.5–5.2)
ALT: 21 U/L (ref 0–53)
AST: 19 U/L (ref 0–37)
Alkaline Phosphatase: 59 U/L (ref 39–117)
BUN: 26 mg/dL — ABNORMAL HIGH (ref 6–23)
CHLORIDE: 111 meq/L (ref 96–112)
CO2: 26 mEq/L (ref 19–32)
Calcium: 9.7 mg/dL (ref 8.4–10.5)
Creatinine, Ser: 1.6 mg/dL — ABNORMAL HIGH (ref 0.4–1.5)
GFR: 44.45 mL/min — ABNORMAL LOW (ref >60.00)
GLUCOSE: 99 mg/dL (ref 70–99)
POTASSIUM: 4.4 meq/L (ref 3.5–5.1)
Sodium: 145 mEq/L (ref 135–145)
TOTAL PROTEIN: 6.9 g/dL (ref 6.0–8.3)
Total Bilirubin: 1 mg/dL (ref 0.3–1.2)

## 2014-02-27 LAB — HEMOGLOBIN A1C: Hgb A1c MFr Bld: 7.4 % — ABNORMAL HIGH (ref 4.6–6.5)

## 2014-03-06 ENCOUNTER — Encounter: Payer: Self-pay | Admitting: Family Medicine

## 2014-03-06 ENCOUNTER — Ambulatory Visit (INDEPENDENT_AMBULATORY_CARE_PROVIDER_SITE_OTHER): Payer: Medicare Other | Admitting: Family Medicine

## 2014-03-06 VITALS — BP 128/64 | HR 61 | Temp 97.7°F | Ht 67.0 in | Wt 151.5 lb

## 2014-03-06 DIAGNOSIS — E119 Type 2 diabetes mellitus without complications: Secondary | ICD-10-CM

## 2014-03-06 DIAGNOSIS — I10 Essential (primary) hypertension: Secondary | ICD-10-CM

## 2014-03-06 DIAGNOSIS — N289 Disorder of kidney and ureter, unspecified: Secondary | ICD-10-CM

## 2014-03-06 MED ORDER — SITAGLIPTIN PHOSPHATE 100 MG PO TABS: 50.0000 mg | ORAL_TABLET | Freq: Every day | ORAL | Status: DC

## 2014-03-06 MED ORDER — INSULIN GLARGINE 100 UNIT/ML SOLOSTAR PEN: 50.0000 [IU] | PEN_INJECTOR | Freq: Every day | SUBCUTANEOUS | Status: DC

## 2014-03-06 NOTE — Patient Instructions (Signed)
Stop metformin - because of kidneys  Cut your januvia to 1/2 pill daily  Continue amaryl  Start your lantus as follows:  Week one 10 units at bedtime Week two 20 units at bedtime Week three 30 units at bedtime  Week four 40 units at bedtime  Week five 50 units at bedtime    Goal for blood sugar is 120s or below in am and 140s or below 2 hours after meal  Check sugar twice daily  Stop if your sugar gets better- hitting these parameters or if you get low sugars   Follow up with me in about a month with your blood sugar reports

## 2014-03-06 NOTE — Assessment & Plan Note (Addendum)
Glucose up w/o metformin (then pt re started it)- long dis re: avoiding nephrotoxic meds Will stop this and cut januvia in 1/2 as well  Start lantus- titrating up in 10 u intervals -as tolerated Disc ref range for gluc control and when to stop inc the dose  He thinks he will have no problems with the pen  Will update asap if hypoglycemia  Rev DM diet  F.u 1 mo with glucose log

## 2014-03-06 NOTE — Progress Notes (Signed)
Subjective:    Patient ID: TENNIS MCKINNON, male    DOB: 12-Mar-1926, 80 y.o.   MRN: [id number]  HPI Here for f/u of chronic medical problems   Wt is up 3 lb with bmi of 23   Cr is up to 1.6 Pt started his metformin back due to high sugar Lab Results  Component Value Date   CREATININE 1.6* 02/27/2014   BUN 26* 02/27/2014   NA 145 02/27/2014   K 4.4 02/27/2014   CL 111 02/27/2014   CO2 26 02/27/2014     Diabetes Home sugar results - sugars shot up after he stopped metformin (as high as 250)  DM diet -continues to be quite good  Exercise -has not been getting much due to weather-wants to get out more  Symptoms- none  A1C last  Lab Results  Component Value Date   HGBA1C 7.4* 02/27/2014   Prev 6.5  No problems with medications  Renal protection on ace  Last eye exam 7/14  bp is stable today  No cp or palpitations or headaches or edema  No side effects to medicines  BP Readings from Last 3 Encounters:  03/06/14 128/64  12/06/13 128/64  09/05/13 116/78     Wt is up 3 lb with bmi of 23   Patient Active Problem List   Diagnosis Date Noted  . Hematuria, microscopic 05/26/2013  . Renal insufficiency 05/24/2013  . Anemia 11/23/2012  . Abnormal TSH 11/23/2012  . Hypothyroid 11/23/2012  . GERD 11/29/2010  . CORONARY ARTERY DISEASE 04/18/2007  . DIABETES MELLITUS, TYPE II 04/15/2007  . HYPERLIPIDEMIA 04/15/2007  . HYPERTENSION 04/15/2007  . MYOCARDIAL INFARCTION, HX OF 04/15/2007  . ACTINIC KERATOSIS 04/15/2007   Past Medical History  Diagnosis Date  . Diabetes mellitus     type II  . Hyperlipidemia   . Hypertension   . CAD (coronary artery disease)   . Colon polyp   . Kidney stones   . Neuropathy    Past Surgical History  Procedure Laterality Date  . Appendectomy  1984  . Cataract extraction  04/2001  . Cholecystectomy  1989  . Retinal detachment surgery  2003  . Shoulder surgery  2012    Lt shoulder repair  . Colonoscopy N/A 2/14    hemorrhoids and tics (after  pos ifob card)  . Esophagogastroduodenoscopy N/A 2/14   History  Substance Use Topics  . Smoking status: Never Smoker   . Smokeless tobacco: Not on file  . Alcohol Use: No   Family History  Problem Relation Age of Onset  . Cancer Brother     prostate CA   Allergies  Allergen Reactions  . Azithromycin     REACTION: nausea   Current Outpatient Prescriptions on File Prior to Visit  Medication Sig Dispense Refill  . acetaminophen (TYLENOL ARTHRITIS PAIN) 650 MG CR tablet Take 650 mg by mouth every 8 (eight) hours as needed.        Marland Kitchen amLODipine (NORVASC) 5 MG tablet Take 1 tablet (5 mg total) by mouth daily.  90 tablet  1  . aspirin 81 MG tablet Take 81 mg by mouth daily.        Marland Kitchen glimepiride (AMARYL) 4 MG tablet TAKE ONE TABLET BY MOUTH EVERY MORNING AND ONE TABLET EVERY EVENING  60 tablet  2  . glucose blood test strip 1 each by Other route 2 (two) times daily as needed. Use as instructed       . guanFACINE (TENEX)  1 MG tablet Take one by mouth daily at bedtime  90 tablet  3  . iron polysaccharides (NIFEREX) 150 MG capsule Take 1 capsule (150 mg total) by mouth 2 (two) times daily.  30 capsule  3  . lisinopril (PRINIVIL,ZESTRIL) 40 MG tablet Take 0.5 tablets (20 mg total) by mouth daily.  45 tablet  3  . magnesium oxide (MAG-OX) 400 MG tablet Take 400 mg by mouth daily.        . Multiple Minerals-Vitamins (CITRACAL PLUS) TABS Take 1 tablet by mouth daily.        . Multiple Vitamin (MULTIVITAMIN) capsule Take 1 capsule by mouth daily.        . Omega-3 Fatty Acids (FISH OIL PO) Take 2 capsules by mouth daily.        Marland Kitchen omeprazole (PRILOSEC OTC) 20 MG tablet Take 1 tablet (20 mg total) by mouth daily.  90 tablet  1  . simvastatin (ZOCOR) 40 MG tablet Take 0.5 tablets (20 mg total) by mouth daily.  45 tablet  1  . Tamsulosin HCl (FLOMAX) 0.4 MG CAPS Take 0.4 mg by mouth daily.         No current facility-administered medications on file prior to visit.    Review of Systems Review of  Systems  Constitutional: Negative for fever, appetite change, fatigue and unexpected weight change.  Eyes: Negative for pain and visual disturbance.  Respiratory: Negative for cough and shortness of breath.   Cardiovascular: Negative for cp or palpitations    Gastrointestinal: Negative for nausea, diarrhea and constipation.  Genitourinary: Negative for urgency and frequency.  ENDO neg for low blood sugar or excessive thirst or urination  Skin: Negative for pallor or rash   Neurological: Negative for weakness, light-headedness, numbness and headaches.  Hematological: Negative for adenopathy. Does not bruise/bleed easily.  Psychiatric/Behavioral: Negative for dysphoric mood. The patient is not nervous/anxious.         Objective:   Physical Exam  Constitutional: He appears well-developed and well-nourished. No distress.  HENT:  Head: Normocephalic and atraumatic.  Mouth/Throat: Oropharynx is clear and moist.  Eyes: Conjunctivae and EOM are normal. Pupils are equal, round, and reactive to light. Left eye exhibits no discharge. No scleral icterus.  Neck: Normal range of motion. Neck supple. No JVD present. Carotid bruit is not present. No thyromegaly present.  Cardiovascular: Normal rate and regular rhythm.   Pulmonary/Chest: Effort normal and breath sounds normal. No respiratory distress. He has no wheezes. He has no rales.  Abdominal: He exhibits no abdominal bruit.  Lymphadenopathy:    He has no cervical adenopathy.  Neurological: He is alert. He has normal reflexes. No cranial nerve deficit. He exhibits normal muscle tone. Coordination normal.  Skin: Skin is warm and dry. No rash noted. No erythema. No pallor.  Psychiatric: He has a normal mood and affect.          Assessment & Plan:

## 2014-03-06 NOTE — Progress Notes (Signed)
Pre visit review using our clinic review tool, if applicable. No additional management support is needed unless otherwise documented below in the visit note. 

## 2014-03-06 NOTE — Assessment & Plan Note (Signed)
bp in fair control at this time  BP Readings from Last 1 Encounters:  03/06/14 128/64   No changes needed Disc lifstyle change with low sodium diet and exercise

## 2014-03-06 NOTE — Assessment & Plan Note (Signed)
Lab Results  Component Value Date   CREATININE 1.6* 02/27/2014    This is back up since he re started metformin on his own Long disc re; avoidance of nephrotoxic meds and controlling DM Cut januvia in 1/2 and stopping metformin Start titrating lantus up as tolerated  Re check 1 mo  Enc fluids

## 2014-03-07 ENCOUNTER — Telehealth: Payer: Self-pay | Admitting: Family Medicine

## 2014-03-07 NOTE — Telephone Encounter (Signed)
Relevant patient education mailed to patient.  

## 2014-03-13 ENCOUNTER — Telehealth: Payer: Self-pay

## 2014-03-13 NOTE — Telephone Encounter (Signed)
Left detailed message on voicemail.  

## 2014-03-13 NOTE — Telephone Encounter (Signed)
I would continue for now with 10 units at night.  I'll route to Dr. Glori Bickers for her input, she'll likely make further adjustments to the lantus dose.

## 2014-03-13 NOTE — Telephone Encounter (Signed)
Stay with the 10 units of lantus and please alert me if any more low blood sugars  Update me in a week with how fasting and evening sugars have been running please

## 2014-03-13 NOTE — Telephone Encounter (Signed)
Pt and pts wife on phone; pt recently started on Lantus 10 units at bedtime; last night pt started Lantus 20 units at bedtime; pts FBS had been running in 50s and 5PM sugars averaging 270 on Lantus 10 units. This morning FBS was 54, pt was sweaty and did not feel good; pt ate 4 vanilla wafers and is eating breakfast now and feels better. Pt advised not to take any more Lantus until gets cb with further instructions. Pt will cb if condition changes or worsens prior to cb.

## 2014-03-14 NOTE — Telephone Encounter (Signed)
Left detailed voicemail letting them know Dr. Marliss Coots comments/recommendations

## 2014-03-14 NOTE — Telephone Encounter (Signed)
Wife notified of Dr. Marliss Coots comments/ recommendations.   Wife is concerned because pt takes the lantus at 10pm before bedtime and in the morning his BS is low sometimes in the mid 68s, and then by 5pm it's really high like in the mid 200s. Wife is concerend and wants to know what should they do and should he take the lantus at a different time?, please advise

## 2014-03-14 NOTE — Telephone Encounter (Signed)
Go ahead and give it to him mid day- lets see if this makes a difference - if he still gets low sugars, stop it and let me know

## 2014-03-15 ENCOUNTER — Other Ambulatory Visit: Payer: Self-pay | Admitting: Family Medicine

## 2014-03-15 ENCOUNTER — Telehealth: Payer: Self-pay

## 2014-03-15 NOTE — Telephone Encounter (Signed)
Relevant patient education mailed to patient.  

## 2014-03-22 ENCOUNTER — Telehealth: Payer: Self-pay | Admitting: Family Medicine

## 2014-03-22 NOTE — Telephone Encounter (Signed)
Continue current dose-since moving his shot to mid day- I dno not see the low sugars in the am- but keep watching that  Please give his book back to him and have him keep charting for 2 more weeks and I will review it again

## 2014-03-22 NOTE — Telephone Encounter (Signed)
Pt dropped off blood glucose levels for Dr. Glori Bickers to review.

## 2014-03-22 NOTE — Telephone Encounter (Signed)
Pt notified. Pt stated that he will pick sugar readings book at his next ov 04/07/2014.

## 2014-03-22 NOTE — Telephone Encounter (Signed)
Bp readings in your basket.

## 2014-04-07 ENCOUNTER — Ambulatory Visit (INDEPENDENT_AMBULATORY_CARE_PROVIDER_SITE_OTHER): Payer: Medicare Other | Admitting: Family Medicine

## 2014-04-07 ENCOUNTER — Encounter: Payer: Self-pay | Admitting: Family Medicine

## 2014-04-07 VITALS — BP 128/60 | HR 57 | Temp 97.6°F | Ht 67.0 in | Wt 153.0 lb

## 2014-04-07 DIAGNOSIS — N289 Disorder of kidney and ureter, unspecified: Secondary | ICD-10-CM

## 2014-04-07 DIAGNOSIS — E119 Type 2 diabetes mellitus without complications: Secondary | ICD-10-CM

## 2014-04-07 LAB — RENAL FUNCTION PANEL
Albumin: 3.7 g/dL (ref 3.5–5.2)
BUN: 24 mg/dL — ABNORMAL HIGH (ref 6–23)
CO2: 26 mEq/L (ref 19–32)
CREATININE: 1.5 mg/dL (ref 0.4–1.5)
Calcium: 9.7 mg/dL (ref 8.4–10.5)
Chloride: 109 mEq/L (ref 96–112)
GFR: 47.93 mL/min — ABNORMAL LOW (ref >60.00)
GLUCOSE: 168 mg/dL — AB (ref 70–99)
PHOSPHORUS: 3 mg/dL (ref 2.3–4.6)
POTASSIUM: 4.8 meq/L (ref 3.5–5.1)
SODIUM: 143 meq/L (ref 135–145)

## 2014-04-07 MED ORDER — INSULIN GLARGINE 100 UNIT/ML SOLOSTAR PEN: 10.0000 [IU] | PEN_INJECTOR | Freq: Every day | SUBCUTANEOUS | Status: DC

## 2014-04-07 NOTE — Progress Notes (Signed)
Subjective:    Patient ID: David Henry, male    DOB: 07-12-1926, 80 y.o.   MRN: [id number]  HPI Here for f/u of DM and renal insuff  Last visit d/c metformin and cut januvia in 1/2 due to inc cr to 1.6  Added lantus  He had to move his shot to mid day to avoid hypoglycemia -- 1 pm now   In am glucose is very good - (occ low) usually 60s-120s   Mid 100s- low 200s for the most part - at 12:30 (haven't eaten since breakfast at that time) If he works outside will eat a snack  Lunch at 2 pm Last meal at 7 pm (biggest meal) --- generally not checking sugar later in the day   Once he had a low sugar at 6 pm -- he felt it  This may have happened several times   Now he is taking 10 u of lantus   He is eating well   Patient Active Problem List   Diagnosis Date Noted  . Hematuria, microscopic 05/26/2013  . Renal insufficiency 05/24/2013  . Anemia 11/23/2012  . Abnormal TSH 11/23/2012  . Hypothyroid 11/23/2012  . GERD 11/29/2010  . CORONARY ARTERY DISEASE 04/18/2007  . DIABETES MELLITUS, TYPE II 04/15/2007  . HYPERLIPIDEMIA 04/15/2007  . HYPERTENSION 04/15/2007  . MYOCARDIAL INFARCTION, HX OF 04/15/2007  . ACTINIC KERATOSIS 04/15/2007   Past Medical History  Diagnosis Date  . Diabetes mellitus     type II  . Hyperlipidemia   . Hypertension   . CAD (coronary artery disease)   . Colon polyp   . Kidney stones   . Neuropathy    Past Surgical History  Procedure Laterality Date  . Appendectomy  1984  . Cataract extraction  04/2001  . Cholecystectomy  1989  . Retinal detachment surgery  2003  . Shoulder surgery  2012    Lt shoulder repair  . Colonoscopy N/A 2/14    hemorrhoids and tics (after pos ifob card)  . Esophagogastroduodenoscopy N/A 2/14   History  Substance Use Topics  . Smoking status: Never Smoker   . Smokeless tobacco: Not on file  . Alcohol Use: No   Family History  Problem Relation Age of Onset  . Cancer Brother     prostate CA   Allergies    Allergen Reactions  . Azithromycin     REACTION: nausea   Current Outpatient Prescriptions on File Prior to Visit  Medication Sig Dispense Refill  . acetaminophen (TYLENOL ARTHRITIS PAIN) 650 MG CR tablet Take 650 mg by mouth every 8 (eight) hours as needed.        Marland Kitchen amLODipine (NORVASC) 5 MG tablet Take 1 tablet (5 mg total) by mouth daily.  90 tablet  1  . aspirin 81 MG tablet Take 81 mg by mouth daily.        Marland Kitchen glimepiride (AMARYL) 4 MG tablet TAKE 1 TABLET EVERY MORNING AND TAKE 1 TABLET EVERY EVENING  60 tablet  0  . glucose blood test strip 1 each by Other route 2 (two) times daily as needed. Use as instructed       . guanFACINE (TENEX) 1 MG tablet Take one by mouth daily at bedtime  90 tablet  3  . Insulin Glargine (LANTUS) 100 UNIT/ML Solostar Pen Inject 50 Units into the skin at bedtime.  15 mL  11  . iron polysaccharides (NIFEREX) 150 MG capsule Take 1 capsule (150 mg total) by mouth 2 (  two) times daily.  30 capsule  3  . lisinopril (PRINIVIL,ZESTRIL) 40 MG tablet Take 0.5 tablets (20 mg total) by mouth daily.  45 tablet  3  . Multiple Minerals-Vitamins (CITRACAL PLUS) TABS Take 1 tablet by mouth daily.        . Multiple Vitamin (MULTIVITAMIN) capsule Take 1 capsule by mouth daily.        . Omega-3 Fatty Acids (FISH OIL PO) Take 2 capsules by mouth daily.        Marland Kitchen omeprazole (PRILOSEC OTC) 20 MG tablet Take 1 tablet (20 mg total) by mouth daily.  90 tablet  1  . simvastatin (ZOCOR) 40 MG tablet Take 0.5 tablets (20 mg total) by mouth daily.  45 tablet  1  . sitaGLIPtin (JANUVIA) 100 MG tablet Take 0.5 tablets (50 mg total) by mouth daily.  30 tablet  11  . Tamsulosin HCl (FLOMAX) 0.4 MG CAPS Take 0.4 mg by mouth daily.         No current facility-administered medications on file prior to visit.     Review of Systems    Review of Systems  Constitutional: Negative for fever, appetite change, fatigue and unexpected weight change.  Eyes: Negative for pain and visual disturbance.   Respiratory: Negative for cough and shortness of breath.   Cardiovascular: Negative for cp or palpitations    Gastrointestinal: Negative for nausea, diarrhea and constipation.  Genitourinary: Negative for urgency and frequency. neg for excessive thirst  Skin: Negative for pallor or rash   Neurological: Negative for weakness, light-headedness, numbness and headaches.  Hematological: Negative for adenopathy. Does not bruise/bleed easily.  Psychiatric/Behavioral: Negative for dysphoric mood. The patient is not nervous/anxious.      Objective:   Physical Exam  Constitutional: He appears well-developed and well-nourished. No distress.  HENT:  Head: Normocephalic and atraumatic.  Mouth/Throat: Oropharynx is clear and moist.  Eyes: Conjunctivae and EOM are normal. Pupils are equal, round, and reactive to light. No scleral icterus.  Neck: Normal range of motion. Neck supple. No JVD present.  Cardiovascular: Normal rate and regular rhythm.   Pulmonary/Chest: Effort normal and breath sounds normal.  Musculoskeletal: He exhibits no edema.  Lymphadenopathy:    He has no cervical adenopathy.  Neurological: He is alert. He has normal reflexes.  Skin: Skin is warm and dry. No rash noted. No erythema. No pallor.  Psychiatric: He has a normal mood and affect.          Assessment & Plan:

## 2014-04-07 NOTE — Progress Notes (Signed)
Pre visit review using our clinic review tool, if applicable. No additional management support is needed unless otherwise documented below in the visit note. 

## 2014-04-07 NOTE — Patient Instructions (Signed)
Continue your lantus at 10 units daily (mid day)- and let me know if you continue low blood sugars  Eat a bedtime snack every night (with protein and also carbohydrate)- for instance peanut butter and cracker or apple  Eat more when you exercise a lot  Instead of checking glucose at 12:30 I want you to check it 2 hours after lunch  Labs for kidney function today Then schedule follow up with labs prior in 2 months

## 2014-04-09 NOTE — Assessment & Plan Note (Signed)
Off metformin and less [place] now  Check labs Per pt good water intake

## 2014-04-09 NOTE — Assessment & Plan Note (Signed)
Pt is having improvement with lantus Will keep dosing mid day Also add bedtime snack to reduce am hypoglycemia  Aware to eat more/ snack when working physically Will begin checking glucose 2 hours after lunch and continue to review readings Check renal panel today on no metformin and less [place]

## 2014-04-10 ENCOUNTER — Encounter: Payer: Self-pay | Admitting: *Deleted

## 2014-04-15 ENCOUNTER — Other Ambulatory Visit: Payer: Self-pay | Admitting: Family Medicine

## 2014-05-16 ENCOUNTER — Other Ambulatory Visit: Payer: Self-pay | Admitting: Family Medicine

## 2014-05-29 ENCOUNTER — Telehealth: Payer: Self-pay | Admitting: Family Medicine

## 2014-05-29 DIAGNOSIS — N289 Disorder of kidney and ureter, unspecified: Secondary | ICD-10-CM

## 2014-05-29 DIAGNOSIS — E119 Type 2 diabetes mellitus without complications: Secondary | ICD-10-CM

## 2014-05-29 NOTE — Telephone Encounter (Signed)
Message copied by Abner Greenspan on Mon May 29, 2014  9:25 PM ------      Message from: Ellamae Sia      Created: Fri May 26, 2014 10:00 AM      Regarding: Lab orders for Tuesday, 6.9.15       Labs for a 2 month f/u ------

## 2014-05-30 ENCOUNTER — Other Ambulatory Visit (INDEPENDENT_AMBULATORY_CARE_PROVIDER_SITE_OTHER): Payer: Medicare Other

## 2014-05-30 DIAGNOSIS — E119 Type 2 diabetes mellitus without complications: Secondary | ICD-10-CM

## 2014-05-30 DIAGNOSIS — N289 Disorder of kidney and ureter, unspecified: Secondary | ICD-10-CM

## 2014-05-30 LAB — RENAL FUNCTION PANEL
ALBUMIN: 3.6 g/dL (ref 3.5–5.2)
BUN: 29 mg/dL — ABNORMAL HIGH (ref 6–23)
CO2: 24 meq/L (ref 19–32)
Calcium: 9.3 mg/dL (ref 8.4–10.5)
Chloride: 110 mEq/L (ref 96–112)
Creatinine, Ser: 1.6 mg/dL — ABNORMAL HIGH (ref 0.4–1.5)
GFR: 43.47 mL/min — AB (ref >60.00)
Glucose, Bld: 71 mg/dL (ref 70–99)
POTASSIUM: 4.1 meq/L (ref 3.5–5.1)
Phosphorus: 3 mg/dL (ref 2.3–4.6)
Sodium: 142 mEq/L (ref 135–145)

## 2014-05-30 LAB — HEMOGLOBIN A1C: HEMOGLOBIN A1C: 6.6 % — AB (ref 4.6–6.5)

## 2014-06-06 ENCOUNTER — Encounter: Payer: Self-pay | Admitting: Family Medicine

## 2014-06-06 ENCOUNTER — Ambulatory Visit (INDEPENDENT_AMBULATORY_CARE_PROVIDER_SITE_OTHER): Payer: Medicare Other | Admitting: Family Medicine

## 2014-06-06 VITALS — BP 126/64 | HR 55 | Temp 98.3°F | Ht 67.0 in | Wt 153.8 lb

## 2014-06-06 DIAGNOSIS — I10 Essential (primary) hypertension: Secondary | ICD-10-CM

## 2014-06-06 DIAGNOSIS — N289 Disorder of kidney and ureter, unspecified: Secondary | ICD-10-CM

## 2014-06-06 DIAGNOSIS — E119 Type 2 diabetes mellitus without complications: Secondary | ICD-10-CM

## 2014-06-06 LAB — MICROALBUMIN / CREATININE URINE RATIO
CREATININE, U: 110.6 mg/dL
MICROALB/CREAT RATIO: 1.6 mg/g (ref 0.0–30.0)
Microalb, Ur: 1.8 mg/dL (ref 0.0–1.9)

## 2014-06-06 NOTE — Progress Notes (Signed)
Subjective:    Patient ID: David Henry, male    DOB: 07-12-1926, 80 y.o.   MRN: [id number]  HPI Here for f/u of DM and other medical problems  bp is stable today  No cp or palpitations or headaches or edema  No side effects to medicines  BP Readings from Last 3 Encounters:  06/06/14 126/64  04/07/14 128/60  03/06/14 128/64     Diabetes Home sugar results -70s to low 100s in the am , pm sugars are higher - with likely avg in 150-160 range (few in 200s) - pms sugars are better lately  Only one episode of hypoglycemia and that was in April right after last visit  DM diet - very good Exercise - very active  Symptoms- none  A1C last  Lab Results  Component Value Date   HGBA1C 6.6* 05/30/2014   This is down from 7.4 No problems with medications - taking lantus mid day (inc to 12 u) - and changed dosing to 9 am  now and on 1/2 dose of januvia and amaryl Renal protection ace  Last eye exam 7/14   Renal insuff Lab Results  Component Value Date   CREATININE 1.6* 05/30/2014   BUN 29* 05/30/2014   NA 142 05/30/2014   K 4.1 05/30/2014   CL 110 05/30/2014   CO2 24 05/30/2014   this is up   Wt is stable with bmi of 24     Patient Active Problem List   Diagnosis Date Noted  . Hematuria, microscopic 05/26/2013  . Renal insufficiency 05/24/2013  . Anemia 11/23/2012  . Abnormal TSH 11/23/2012  . Hypothyroid 11/23/2012  . GERD 11/29/2010  . CORONARY ARTERY DISEASE 04/18/2007  . DIABETES MELLITUS, TYPE II 04/15/2007  . HYPERLIPIDEMIA 04/15/2007  . HYPERTENSION 04/15/2007  . MYOCARDIAL INFARCTION, HX OF 04/15/2007  . ACTINIC KERATOSIS 04/15/2007   Past Medical History  Diagnosis Date  . Diabetes mellitus     type II  . Hyperlipidemia   . Hypertension   . CAD (coronary artery disease)   . Colon polyp   . Kidney stones   . Neuropathy    Past Surgical History  Procedure Laterality Date  . Appendectomy  1984  . Cataract extraction  04/2001  . Cholecystectomy  1989  .  Retinal detachment surgery  2003  . Shoulder surgery  2012    Lt shoulder repair  . Colonoscopy N/A 2/14    hemorrhoids and tics (after pos ifob card)  . Esophagogastroduodenoscopy N/A 2/14   History  Substance Use Topics  . Smoking status: Never Smoker   . Smokeless tobacco: Not on file  . Alcohol Use: No   Family History  Problem Relation Age of Onset  . Cancer Brother     prostate CA   Allergies  Allergen Reactions  . Azithromycin     REACTION: nausea   Current Outpatient Prescriptions on File Prior to Visit  Medication Sig Dispense Refill  . acetaminophen (TYLENOL ARTHRITIS PAIN) 650 MG CR tablet Take 650 mg by mouth every 8 (eight) hours as needed.        Marland Kitchen amLODipine (NORVASC) 5 MG tablet TAKE 1 TABLET BY MOUTH ONCE A DAY  90 tablet  1  . aspirin 81 MG tablet Take 81 mg by mouth daily.        Marland Kitchen glimepiride (AMARYL) 4 MG tablet TAKE 1 TABLET EVERY MORNING AND TAKE 1 TABLET EVERY EVENING  60 tablet  5  . glucose blood  test strip 1 each by Other route 2 (two) times daily as needed. Use as instructed       . guanFACINE (TENEX) 1 MG tablet Take one by mouth daily at bedtime  90 tablet  3  . iron polysaccharides (NIFEREX) 150 MG capsule Take 1 capsule (150 mg total) by mouth 2 (two) times daily.  30 capsule  3  . lisinopril (PRINIVIL,ZESTRIL) 40 MG tablet Take 0.5 tablets (20 mg total) by mouth daily.  45 tablet  3  . Multiple Minerals-Vitamins (CITRACAL PLUS) TABS Take 1 tablet by mouth daily.        . Multiple Vitamin (MULTIVITAMIN) capsule Take 1 capsule by mouth daily.        . Omega-3 Fatty Acids (FISH OIL PO) Take 2 capsules by mouth daily.        Marland Kitchen omeprazole (PRILOSEC OTC) 20 MG tablet Take 1 tablet (20 mg total) by mouth daily.  90 tablet  1  . simvastatin (ZOCOR) 40 MG tablet Take 0.5 tablets (20 mg total) by mouth daily.  45 tablet  1  . sitaGLIPtin (JANUVIA) 100 MG tablet Take 0.5 tablets (50 mg total) by mouth daily.  30 tablet  11  . Tamsulosin HCl (FLOMAX) 0.4 MG  CAPS Take 0.4 mg by mouth daily.         No current facility-administered medications on file prior to visit.    Review of Systems Review of Systems  Constitutional: Negative for fever, appetite change, fatigue and unexpected weight change.  Eyes: Negative for pain and visual disturbance.  Respiratory: Negative for cough and shortness of breath.   Cardiovascular: Negative for cp or palpitations    Gastrointestinal: Negative for nausea, diarrhea and constipation.  Genitourinary: Negative for urgency and frequency.  Skin: Negative for pallor or rash   Neurological: Negative for weakness, light-headedness, numbness and headaches.  Hematological: Negative for adenopathy. Does not bruise/bleed easily.  Psychiatric/Behavioral: Negative for dysphoric mood. The patient is not nervous/anxious.         Objective:   Physical Exam  Constitutional: He appears well-developed and well-nourished. No distress.  HENT:  Head: Normocephalic and atraumatic.  Mouth/Throat: Oropharynx is clear and moist.  Eyes: Conjunctivae and EOM are normal. Pupils are equal, round, and reactive to light. No scleral icterus.  Neck: Normal range of motion. Neck supple. No JVD present. Carotid bruit is not present. No thyromegaly present.  Cardiovascular: Normal rate, regular rhythm, normal heart sounds and intact distal pulses.  Exam reveals no gallop.   Pulmonary/Chest: Effort normal and breath sounds normal. No respiratory distress. He has no wheezes. He exhibits no tenderness.  Abdominal: Soft. Bowel sounds are normal. He exhibits no distension, no abdominal bruit and no mass. There is no tenderness.  Musculoskeletal: He exhibits no edema.  Lymphadenopathy:    He has no cervical adenopathy.  Neurological: He is alert. He has normal reflexes. No cranial nerve deficit.  Skin: Skin is warm and dry. No rash noted. No erythema. No pallor.  Psychiatric: He has a normal mood and affect.          Assessment & Plan:    Problem List Items Addressed This Visit     Cardiovascular and Mediastinum   HYPERTENSION - Primary      bp in fair control at this time  BP Readings from Last 1 Encounters:  06/06/14 126/64   No changes needed Disc lifstyle change with low sodium diet and exercise  Lab rev  Endocrine   DIABETES MELLITUS, TYPE II     Better A1c at 6.6  On lantus 12 u daily and 1/2 dose of januvia  microalb today- and ref to renal for renal insuff   Asked him again to have a bedtime snack to ward off am hypoglycemia (this has improved however)     Relevant Medications      Insulin Glargine (LANTUS) 100 UNIT/ML Solostar Pen   Other Relevant Orders      Microalbumin / creatinine urine ratio (Completed)     Genitourinary   Renal insufficiency     Cr 1.6 and GFR in 40s- in pt with vasc dz/ DM and well controlled htn On ace microalb today Ref to renal  Reminded re: fluid intake       Relevant Orders      Ambulatory referral to Nephrology

## 2014-06-06 NOTE — Assessment & Plan Note (Signed)
bp in fair control at this time  BP Readings from Last 1 Encounters:  06/06/14 126/64   No changes needed Disc lifstyle change with low sodium diet and exercise  Lab rev

## 2014-06-06 NOTE — Patient Instructions (Signed)
Leave a urine specimen today  Stop at check out for referral to nephrology (kidney doctor) Schedule annual exam with labs prior for 6 months approx

## 2014-06-06 NOTE — Assessment & Plan Note (Signed)
Cr 1.6 and GFR in 40s- in pt with vasc dz/ DM and well controlled htn On ace microalb today Ref to renal  Reminded re: fluid intake

## 2014-06-06 NOTE — Assessment & Plan Note (Signed)
Better A1c at 6.6  On lantus 12 u daily and 1/2 dose of januvia  microalb today- and ref to renal for renal insuff   Asked him again to have a bedtime snack to ward off am hypoglycemia (this has improved however)

## 2014-06-06 NOTE — Progress Notes (Signed)
Pre visit review using our clinic review tool, if applicable. No additional management support is needed unless otherwise documented below in the visit note. 

## 2014-06-07 ENCOUNTER — Encounter: Payer: Self-pay | Admitting: *Deleted

## 2014-06-14 ENCOUNTER — Other Ambulatory Visit: Payer: Self-pay | Admitting: Family Medicine

## 2014-07-31 ENCOUNTER — Telehealth: Payer: Self-pay

## 2014-07-31 MED ORDER — LIDOCAINE VISCOUS 2 % MT SOLN: 10.0000 mL | OROMUCOSAL | Status: DC | PRN

## 2014-07-31 NOTE — Telephone Encounter (Signed)
Patient advised.

## 2014-07-31 NOTE — Telephone Encounter (Signed)
Pt said this morning omeprazole capsule got stuck in throat and dissolved in his throat, pt has burning and irritated feeling in throat now; pt is sure capsule has dissolved and is no longer in pts throat; pt said no problem swallowing, no difficulty breathing. Pt request med to soothe the irritation in throat; med to put coating on throat. [place].Pt request cb.

## 2014-07-31 NOTE — Telephone Encounter (Signed)
Gargle with warm salt water, if not better then use lidocaine. Sent. Thanks.

## 2014-09-13 ENCOUNTER — Ambulatory Visit (INDEPENDENT_AMBULATORY_CARE_PROVIDER_SITE_OTHER): Payer: Medicare Other

## 2014-09-13 DIAGNOSIS — Z23 Encounter for immunization: Secondary | ICD-10-CM

## 2014-10-13 ENCOUNTER — Other Ambulatory Visit: Payer: Self-pay | Admitting: Family Medicine

## 2014-11-07 ENCOUNTER — Other Ambulatory Visit: Payer: Self-pay

## 2014-11-07 MED ORDER — GLUCOSE BLOOD VI STRP: ORAL_STRIP | Status: DC

## 2014-11-07 NOTE — Telephone Encounter (Signed)
Pt request refill one touch ultra blue test strips #200 to [facility]. Advised pt done.

## 2014-11-09 ENCOUNTER — Telehealth: Payer: Self-pay

## 2014-11-09 NOTE — Telephone Encounter (Signed)
Amora with [facility] left v/m requesting status of fax for diabetic supplies sent on 11/08/14.Please advise.

## 2014-11-09 NOTE — Telephone Encounter (Signed)
I just received them this morning and they are in Dr. Marliss Coots inbox

## 2014-11-12 ENCOUNTER — Encounter: Payer: Self-pay | Admitting: Family Medicine

## 2014-11-20 ENCOUNTER — Other Ambulatory Visit: Payer: Self-pay | Admitting: Family Medicine

## 2014-11-20 NOTE — Telephone Encounter (Signed)
Received refill request electronically from pharmacy. See warning between Amlodipine and Simvastatin. Is it okay to refill medication?

## 2014-11-20 NOTE — Telephone Encounter (Signed)
Please refill for a year  

## 2014-11-30 ENCOUNTER — Telehealth: Payer: Self-pay | Admitting: Family Medicine

## 2014-11-30 DIAGNOSIS — E785 Hyperlipidemia, unspecified: Secondary | ICD-10-CM

## 2014-11-30 DIAGNOSIS — E039 Hypothyroidism, unspecified: Secondary | ICD-10-CM

## 2014-11-30 DIAGNOSIS — E119 Type 2 diabetes mellitus without complications: Secondary | ICD-10-CM

## 2014-11-30 DIAGNOSIS — I10 Essential (primary) hypertension: Secondary | ICD-10-CM

## 2014-11-30 NOTE — Telephone Encounter (Signed)
-----   Message from Ellamae Sia sent at 11/22/2014  3:06 PM EST ----- Regarding: Lab orders for Friday, 12.11.15 Patient is scheduled for CPX labs, please order future labs, Thanks , Karna Christmas

## 2014-12-01 ENCOUNTER — Other Ambulatory Visit (INDEPENDENT_AMBULATORY_CARE_PROVIDER_SITE_OTHER): Payer: Medicare Other

## 2014-12-01 DIAGNOSIS — E039 Hypothyroidism, unspecified: Secondary | ICD-10-CM

## 2014-12-01 DIAGNOSIS — E119 Type 2 diabetes mellitus without complications: Secondary | ICD-10-CM

## 2014-12-01 DIAGNOSIS — E785 Hyperlipidemia, unspecified: Secondary | ICD-10-CM

## 2014-12-01 DIAGNOSIS — I10 Essential (primary) hypertension: Secondary | ICD-10-CM

## 2014-12-01 LAB — CBC WITH DIFFERENTIAL/PLATELET
Basophils Absolute: 0 10*3/uL (ref 0.0–0.1)
Basophils Relative: 0.3 % (ref 0.0–3.0)
EOS ABS: 0.3 10*3/uL (ref 0.0–0.7)
EOS PCT: 3.9 % (ref 0.0–5.0)
HCT: 41.1 % (ref 39.0–52.0)
Hemoglobin: 13.4 g/dL (ref 13.0–17.0)
LYMPHS PCT: 21.6 % (ref 12.0–46.0)
Lymphs Abs: 1.7 10*3/uL (ref 0.7–4.0)
MCHC: 32.5 g/dL (ref 30.0–36.0)
MCV: 100.7 fl — ABNORMAL HIGH (ref 78.0–100.0)
Monocytes Absolute: 0.5 10*3/uL (ref 0.1–1.0)
Monocytes Relative: 6.1 % (ref 3.0–12.0)
NEUTROS PCT: 68.1 % (ref 43.0–77.0)
Neutro Abs: 5.3 10*3/uL (ref 1.4–7.7)
PLATELETS: 227 10*3/uL (ref 150.0–400.0)
RBC: 4.08 Mil/uL — ABNORMAL LOW (ref 4.22–5.81)
RDW: 13.7 % (ref 11.5–15.5)
WBC: 7.7 10*3/uL (ref 4.0–10.5)

## 2014-12-01 LAB — COMPREHENSIVE METABOLIC PANEL
ALBUMIN: 3.9 g/dL (ref 3.5–5.2)
ALT: 18 U/L (ref 0–53)
AST: 16 U/L (ref 0–37)
Alkaline Phosphatase: 55 U/L (ref 39–117)
BUN: 29 mg/dL — AB (ref 6–23)
CALCIUM: 9.6 mg/dL (ref 8.4–10.5)
CHLORIDE: 108 meq/L (ref 96–112)
CO2: 26 mEq/L (ref 19–32)
Creatinine, Ser: 1.6 mg/dL — ABNORMAL HIGH (ref 0.4–1.5)
GFR: 43.42 mL/min — ABNORMAL LOW (ref >60.00)
Glucose, Bld: 107 mg/dL — ABNORMAL HIGH (ref 70–99)
POTASSIUM: 4.6 meq/L (ref 3.5–5.1)
SODIUM: 139 meq/L (ref 135–145)
Total Bilirubin: 0.7 mg/dL (ref 0.2–1.2)
Total Protein: 6.6 g/dL (ref 6.0–8.3)

## 2014-12-01 LAB — LIPID PANEL
CHOLESTEROL: 123 mg/dL (ref 0–200)
HDL: 39.4 mg/dL (ref >39.00)
LDL CALC: 62 mg/dL (ref 0–99)
NonHDL: 83.6
Total CHOL/HDL Ratio: 3
Triglycerides: 106 mg/dL (ref 0.0–149.0)
VLDL: 21.2 mg/dL (ref 0.0–40.0)

## 2014-12-01 LAB — TSH: TSH: 8.07 u[IU]/mL — ABNORMAL HIGH (ref 0.35–4.50)

## 2014-12-01 LAB — HEMOGLOBIN A1C: Hgb A1c MFr Bld: 7 % — ABNORMAL HIGH (ref 4.6–6.5)

## 2014-12-08 ENCOUNTER — Ambulatory Visit (INDEPENDENT_AMBULATORY_CARE_PROVIDER_SITE_OTHER): Payer: Medicare Other | Admitting: Family Medicine

## 2014-12-08 ENCOUNTER — Encounter: Payer: Self-pay | Admitting: Family Medicine

## 2014-12-08 VITALS — BP 116/60 | HR 64 | Temp 97.7°F | Ht 67.0 in | Wt 155.0 lb

## 2014-12-08 DIAGNOSIS — Z23 Encounter for immunization: Secondary | ICD-10-CM

## 2014-12-08 DIAGNOSIS — I10 Essential (primary) hypertension: Secondary | ICD-10-CM

## 2014-12-08 DIAGNOSIS — E785 Hyperlipidemia, unspecified: Secondary | ICD-10-CM

## 2014-12-08 DIAGNOSIS — E039 Hypothyroidism, unspecified: Secondary | ICD-10-CM

## 2014-12-08 DIAGNOSIS — Z0000 Encounter for general adult medical examination without abnormal findings: Secondary | ICD-10-CM

## 2014-12-08 DIAGNOSIS — E119 Type 2 diabetes mellitus without complications: Secondary | ICD-10-CM

## 2014-12-08 DIAGNOSIS — N289 Disorder of kidney and ureter, unspecified: Secondary | ICD-10-CM

## 2014-12-08 MED ORDER — LEVOTHYROXINE SODIUM 25 MCG PO TABS: 25.0000 ug | ORAL_TABLET | Freq: Every day | ORAL | Status: DC

## 2014-12-08 NOTE — Assessment & Plan Note (Signed)
Lab Results  Component Value Date   TSH 8.07* 12/01/2014    Will start levothyroxine 25 mcg  No symptoms  Re check 3 mo at visit

## 2014-12-08 NOTE — Patient Instructions (Addendum)
prevnar vaccine today  Start thyroid supplement - levothyroxine - one pill daily  Follow up in 3 months with labs prior for diabetes and thyroid   Watch diet for diabetes and stay active    I filled out forms for your diabetic equipment

## 2014-12-08 NOTE — Progress Notes (Signed)
Pre visit review using our clinic review tool, if applicable. No additional management support is needed unless otherwise documented below in the visit note. 

## 2014-12-08 NOTE — Assessment & Plan Note (Signed)
Reviewed health habits including diet and exercise and skin cancer prevention Reviewed appropriate screening tests for age  Also reviewed health mt list, fam hx and immunization status , as well as social and family history   See hpi prevnar today

## 2014-12-08 NOTE — Assessment & Plan Note (Signed)
A1C is up to 7  Will watch diet more carefully (reviewed) Lab and f/u in 3 mo

## 2014-12-08 NOTE — Progress Notes (Signed)
Subjective:    Patient ID: David Henry, male    DOB: 1926/10/29, 80 y.o.   MRN: [id number]  HPI  Here for annual medicare visit and acute/chronic medical problems  I have personally reviewed the Medicare Annual Wellness questionnaire and have noted 1. The patient's medical and social history 2. Their use of alcohol, tobacco or illicit drugs 3. Their current medications and supplements 4. The patient's functional ability including ADL's, fall risks, home safety risks and hearing or visual             impairment. 5. Diet and physical activities 6. Evidence for depression or mood disorders  The patients weight, height, BMI have been recorded in the chart and visual acuity is per eye clinic.  I have made referrals, counseling and provided education to the patient based review of the above and I have provided the pt with a written personalized care plan for preventive services.  Feels good overall   Had and episode of getting a pill stuck in his throat - and had trouble with it for months  Never saw a specialist  Had a lot of throat burning  No problems since - taking pills carefully  See scanned forms.  Routine anticipatory guidance given to patient.  See health maintenance. Colon cancer screening 1/14   Flu vaccine 9/15 Tetanus vaccine 5/12 Pneumovax 8/04 - will get prevnar today  Zoster vaccine 8/13  Not screening for prostate cancer -at his age   Advance directive-has that set up  Cognitive function addressed- see scanned forms- and if abnormal then additional documentation follows.  occ misplaces items-that is as bad as it gets    PMH and SH reviewed  Meds, vitals, and allergies reviewed.   ROS: See HPI.  Otherwise negative.    Saw opthy in 7/15- ok   Renal insuf   Chemistry      Component Value Date/Time   NA 139 12/01/2014 0816   K 4.6 12/01/2014 0816   CL 108 12/01/2014 0816   CO2 26 12/01/2014 0816   BUN 29* 12/01/2014 0816   CREATININE 1.6*  12/01/2014 0816      Component Value Date/Time   CALCIUM 9.6 12/01/2014 0816   ALKPHOS 55 12/01/2014 0816   AST 16 12/01/2014 0816   ALT 18 12/01/2014 0816   BILITOT 0.7 12/01/2014 0816      Sees renal - keeping an eye on him   DM  Lab Results  Component Value Date   HGBA1C 7.0* 12/01/2014  has not been eating any differently  Ok in am -occ gets low sugar readings (he eats a bedtime snack) In pm -it runs higher - at times as high as 200   bkfast- cheerios and milk  Lunch - 1 pc bread and ham and 1/2 to whole apple  Supper - occ fish meal (fried) , vegetables  Exercise - is moving all day long-walking and working   bp is stable today  No cp or palpitations or headaches or edema  No side effects to medicines  BP Readings from Last 3 Encounters:  12/08/14 116/60  06/06/14 126/64  04/07/14 128/60     tsh is elevated  Lab Results  Component Value Date   TSH 8.07* 12/01/2014     Patient Active Problem List   Diagnosis Date Noted  . Encounter for Medicare annual wellness exam 12/08/2014  . Hematuria, microscopic 05/26/2013  . Renal insufficiency 05/24/2013  . Anemia 11/23/2012  . Abnormal TSH 11/23/2012  .  Hypothyroid 11/23/2012  . GERD 11/29/2010  . CORONARY ARTERY DISEASE 04/18/2007  . Diabetes type 2, controlled 04/15/2007  . Hyperlipidemia 04/15/2007  . Essential hypertension 04/15/2007  . MYOCARDIAL INFARCTION, HX OF 04/15/2007  . ACTINIC KERATOSIS 04/15/2007   Past Medical History  Diagnosis Date  . Diabetes mellitus     type II  . Hyperlipidemia   . Hypertension   . CAD (coronary artery disease)   . Colon polyp   . Kidney stones   . Neuropathy    Past Surgical History  Procedure Laterality Date  . Appendectomy  1984  . Cataract extraction  04/2001  . Cholecystectomy  1989  . Retinal detachment surgery  2003  . Shoulder surgery  2012    Lt shoulder repair  . Colonoscopy N/A 2/14    hemorrhoids and tics (after pos ifob card)  .  Esophagogastroduodenoscopy N/A 2/14   History  Substance Use Topics  . Smoking status: Never Smoker   . Smokeless tobacco: Not on file  . Alcohol Use: No   Family History  Problem Relation Age of Onset  . Cancer Brother     prostate CA   Allergies  Allergen Reactions  . Azithromycin     REACTION: nausea   Current Outpatient Prescriptions on File Prior to Visit  Medication Sig Dispense Refill  . acetaminophen (TYLENOL ARTHRITIS PAIN) 650 MG CR tablet Take 650 mg by mouth every 8 (eight) hours as needed.      Marland Kitchen amLODipine (NORVASC) 5 MG tablet TAKE 1 TABLET BY MOUTH ONCE A DAY 90 tablet 0  . aspirin 81 MG tablet Take 81 mg by mouth daily.      Marland Kitchen glimepiride (AMARYL) 4 MG tablet TAKE 1 TABLET EVERY MORNING AND TAKE 1 TABLET EVERY EVENING 60 tablet 1  . glucose blood (ONE TOUCH ULTRA TEST) test strip Check blood sugar twice a day and as directed. Dx E11.9 200 each 3  . guanFACINE (TENEX) 1 MG tablet Take one by mouth daily at bedtime 90 tablet 3  . Insulin Glargine (LANTUS) 100 UNIT/ML Solostar Pen Inject 12 Units into the skin every morning.    . iron polysaccharides (NIFEREX) 150 MG capsule Take 1 capsule (150 mg total) by mouth 2 (two) times daily. 30 capsule 3  . lidocaine (XYLOCAINE) 2 % solution Use as directed 10 mLs in the mouth or throat every 4 (four) hours as needed for mouth pain. 100 mL 1  . lisinopril (PRINIVIL,ZESTRIL) 40 MG tablet Take 0.5 tablets (20 mg total) by mouth daily. 45 tablet 3  . lisinopril (PRINIVIL,ZESTRIL) 40 MG tablet TAKE 1/2 TABLET BY MOUTH ONCE A DAY 45 tablet 1  . Multiple Minerals-Vitamins (CITRACAL PLUS) TABS Take 1 tablet by mouth daily.      . Multiple Vitamin (MULTIVITAMIN) capsule Take 1 capsule by mouth daily.      . NON FORMULARY 100 BC ultra fine needles    . Omega-3 Fatty Acids (FISH OIL PO) Take 2 capsules by mouth daily.      Marland Kitchen omeprazole (PRILOSEC) 20 MG capsule TAKE 1 CAPSULE BY MOUTH ONCE DAILY 90 capsule 1  . simvastatin (ZOCOR) 40  MG tablet Take 0.5 tablets (20 mg total) by mouth daily. 45 tablet 1  . sitaGLIPtin (JANUVIA) 100 MG tablet Take 0.5 tablets (50 mg total) by mouth daily. 30 tablet 11  . Tamsulosin HCl (FLOMAX) 0.4 MG CAPS Take 0.4 mg by mouth daily.      . [DISCONTINUED] omeprazole (PRILOSEC OTC) 20  MG tablet Take 1 tablet (20 mg total) by mouth daily. 90 tablet 1   No current facility-administered medications on file prior to visit.    Review of Systems    Review of Systems  Constitutional: Negative for fever, appetite change, fatigue and unexpected weight change.  Eyes: Negative for pain and visual disturbance.  Respiratory: Negative for cough and shortness of breath.   Cardiovascular: Negative for cp or palpitations    Gastrointestinal: Negative for nausea, diarrhea and constipation.  Genitourinary: Negative for urgency and frequency.  Skin: Negative for pallor or rash   Neurological: Negative for weakness, light-headedness, numbness and headaches.  Hematological: Negative for adenopathy. Does not bruise/bleed easily.  Psychiatric/Behavioral: Negative for dysphoric mood. The patient is not nervous/anxious.      Objective:   Physical Exam  Constitutional: He appears well-developed and well-nourished. No distress.  Well appearing elderly male   HENT:  Head: Normocephalic and atraumatic.  Right Ear: External ear normal.  Left Ear: External ear normal.  Nose: Nose normal.  Mouth/Throat: Oropharynx is clear and moist.  Eyes: Conjunctivae and EOM are normal. Pupils are equal, round, and reactive to light. Right eye exhibits no discharge. Left eye exhibits no discharge. No scleral icterus.  Neck: Normal range of motion. Neck supple. No JVD present. Carotid bruit is not present. No thyromegaly present.  Cardiovascular: Normal rate, regular rhythm, normal heart sounds and intact distal pulses.  Exam reveals no gallop.   LE varicosities noted   Pulmonary/Chest: Effort normal and breath sounds normal.  No respiratory distress. He has no wheezes. He exhibits no tenderness.  Abdominal: Soft. Bowel sounds are normal. He exhibits no distension, no abdominal bruit and no mass. There is no tenderness.  Musculoskeletal: He exhibits no edema or tenderness.  Lymphadenopathy:    He has no cervical adenopathy.  Neurological: He is alert. He has normal reflexes. No cranial nerve deficit. He exhibits normal muscle tone. Coordination normal.  Skin: Skin is warm and dry. No rash noted. No erythema. No pallor.  Some SKs and AKs diffusely  Psychiatric: He has a normal mood and affect.          Assessment & Plan:   Problem List Items Addressed This Visit      Cardiovascular and Mediastinum   Essential hypertension    bp in fair control at this time  BP Readings from Last 1 Encounters:  12/08/14 116/60   No changes needed Disc lifstyle change with low sodium diet and exercise  Labs reviewed       Endocrine   Diabetes type 2, controlled    A1C is up to 7  Will watch diet more carefully (reviewed) Lab and f/u in 3 mo     Relevant Orders      Hemoglobin A1c   Hypothyroid    Lab Results  Component Value Date   TSH 8.07* 12/01/2014    Will start levothyroxine 25 mcg  No symptoms  Re check 3 mo at visit    Relevant Medications      levothyroxine (SYNTHROID, LEVOTHROID) tablet   Other Relevant Orders      TSH     Genitourinary   Renal insufficiency      Chemistry      Component Value Date/Time   NA 139 12/01/2014 0816   K 4.6 12/01/2014 0816   CL 108 12/01/2014 0816   CO2 26 12/01/2014 0816   BUN 29* 12/01/2014 0816   CREATININE 1.6* 12/01/2014 6629  Component Value Date/Time   CALCIUM 9.6 12/01/2014 0816   ALKPHOS 55 12/01/2014 0816   AST 16 12/01/2014 0816   ALT 18 12/01/2014 0816   BILITOT 0.7 12/01/2014 0816     This is stable  Followed by renal  CKD 3  Multifactorial Reminded to drink water      Other   Encounter for Medicare annual wellness exam -  Primary    Reviewed health habits including diet and exercise and skin cancer prevention Reviewed appropriate screening tests for age  Also reviewed health mt list, fam hx and immunization status , as well as social and family history   See hpi prevnar today    Hyperlipidemia    Stable with statin and diet  Lab Results  Component Value Date   LDLCALC 62 12/01/2014    Good control  Disc goals for lipids and reasons to control them Rev labs with pt Rev low sat fat diet in detail      Other Visit Diagnoses    Need for vaccination with 13-polyvalent pneumococcal conjugate vaccine        Relevant Orders       Pneumococcal conjugate vaccine 13-valent (Completed)

## 2014-12-08 NOTE — Assessment & Plan Note (Signed)
Chemistry      Component Value Date/Time   NA 139 12/01/2014 0816   K 4.6 12/01/2014 0816   CL 108 12/01/2014 0816   CO2 26 12/01/2014 0816   BUN 29* 12/01/2014 0816   CREATININE 1.6* 12/01/2014 0816      Component Value Date/Time   CALCIUM 9.6 12/01/2014 0816   ALKPHOS 55 12/01/2014 0816   AST 16 12/01/2014 0816   ALT 18 12/01/2014 0816   BILITOT 0.7 12/01/2014 0816     This is stable  Followed by renal  CKD 3  Multifactorial Reminded to drink water

## 2014-12-08 NOTE — Assessment & Plan Note (Addendum)
bp in fair control at this time  BP Readings from Last 1 Encounters:  12/08/14 116/60   No changes needed Disc lifstyle change with low sodium diet and exercise  Labs reviewed

## 2014-12-08 NOTE — Assessment & Plan Note (Signed)
Stable with statin and diet  Lab Results  Component Value Date   LDLCALC 62 12/01/2014    Good control  Disc goals for lipids and reasons to control them Rev labs with pt Rev low sat fat diet in detail

## 2014-12-12 ENCOUNTER — Other Ambulatory Visit: Payer: Self-pay | Admitting: Family Medicine

## 2014-12-25 ENCOUNTER — Other Ambulatory Visit: Payer: Self-pay | Admitting: Family Medicine

## 2015-03-09 ENCOUNTER — Other Ambulatory Visit (INDEPENDENT_AMBULATORY_CARE_PROVIDER_SITE_OTHER): Payer: Medicare Other

## 2015-03-09 DIAGNOSIS — E119 Type 2 diabetes mellitus without complications: Secondary | ICD-10-CM

## 2015-03-09 DIAGNOSIS — E039 Hypothyroidism, unspecified: Secondary | ICD-10-CM

## 2015-03-09 LAB — TSH: TSH: 2.17 u[IU]/mL (ref 0.35–4.50)

## 2015-03-09 LAB — HEMOGLOBIN A1C: Hgb A1c MFr Bld: 7 % — ABNORMAL HIGH (ref 4.6–6.5)

## 2015-03-13 ENCOUNTER — Encounter: Payer: Self-pay | Admitting: Family Medicine

## 2015-03-13 ENCOUNTER — Ambulatory Visit (INDEPENDENT_AMBULATORY_CARE_PROVIDER_SITE_OTHER): Payer: Medicare Other | Admitting: Family Medicine

## 2015-03-13 VITALS — BP 122/58 | HR 59 | Temp 97.7°F | Ht 67.0 in

## 2015-03-13 DIAGNOSIS — E119 Type 2 diabetes mellitus without complications: Secondary | ICD-10-CM

## 2015-03-13 DIAGNOSIS — E039 Hypothyroidism, unspecified: Secondary | ICD-10-CM

## 2015-03-13 DIAGNOSIS — I10 Essential (primary) hypertension: Secondary | ICD-10-CM

## 2015-03-13 MED ORDER — SITAGLIPTIN PHOSPHATE 100 MG PO TABS: 50.0000 mg | ORAL_TABLET | Freq: Every day | ORAL | Status: DC

## 2015-03-13 MED ORDER — OMEPRAZOLE 20 MG PO CPDR: 20.0000 mg | DELAYED_RELEASE_CAPSULE | Freq: Every day | ORAL | Status: DC

## 2015-03-13 MED ORDER — TAMSULOSIN HCL 0.4 MG PO CAPS: 0.4000 mg | ORAL_CAPSULE | Freq: Every day | ORAL | Status: DC

## 2015-03-13 NOTE — Patient Instructions (Signed)
Diabetes and thyroid are stable  Make sure to drink enough water Get as much exercise as you can safely  Follow up in 6 months with labs prior

## 2015-03-13 NOTE — Assessment & Plan Note (Signed)
Lab Results  Component Value Date   HGBA1C 7.0* 03/09/2015   This seems to be the best control he can achieve without hypoglycemia  Rev low glycemic diet  Recent wt gain from inactivity in winter- is about to change Will continue watching glucose readings  F/u 6 mo with labs prior

## 2015-03-13 NOTE — Assessment & Plan Note (Signed)
Lab Results  Component Value Date   TSH 2.17 03/09/2015   No clinical changes  No change in levothyroxine dose

## 2015-03-13 NOTE — Progress Notes (Signed)
Subjective:    Patient ID: David Henry, male    DOB: 10/18/1926, 81 y.o.   MRN: [id number]  HPI Here for f/u of chronic medical problems  Is doing ok   Feels the same  Takes fair care of himself   bmi 24   Renal insuff Sees renal Lab Results  Component Value Date   CREATININE 1.6* 12/01/2014   BUN 29* 12/01/2014   NA 139 12/01/2014   K 4.6 12/01/2014   CL 108 12/01/2014   CO2 26 12/01/2014  he saw the kidney doctor    Hyperlipidemia Lab Results  Component Value Date   CHOL 123 12/01/2014   CHOL 117 12/01/2013   CHOL 105 05/18/2013   Lab Results  Component Value Date   HDL 39.40 12/01/2014   HDL 42.50 12/01/2013   HDL 38.40* 05/18/2013   Lab Results  Component Value Date   LDLCALC 62 12/01/2014   LDLCALC 38 12/01/2013   LDLCALC 46 05/18/2013   Lab Results  Component Value Date   TRIG 106.0 12/01/2014   TRIG 184.0* 12/01/2013   TRIG 102.0 05/18/2013   Lab Results  Component Value Date   CHOLHDL 3 12/01/2014   CHOLHDL 3 12/01/2013   CHOLHDL 3 05/18/2013   No results found for: LDLDIRECT  Hypothyroidism  Pt has no clinical changes  Would like me to write his flomax - Dr Jacqlyn Larsen moved   No change in energy level/ hair or skin/ edema and no tremor Lab Results  Component Value Date   TSH 2.17 03/09/2015    DM2 Lab Results  Component Value Date   HGBA1C 7.0* 03/09/2015   Same as last time  He has really trying to watch his diet  He has gained some wt - in belly  Not eating any more  Not eating higher calorie foods   Very infrequently gets low blood sugar - not lately  No exercise in the winter at all - cannot get out and also his back bothers him when he tries to exercise  In the spring - does yard work  In the summer - even more - weed eat/ blowing / grass cutting   Appetite is ok  He does like to snack    Lost his sister 11 in the past month   Patient Active Problem List   Diagnosis Date Noted  . Encounter for Medicare annual  wellness exam 12/08/2014  . Hematuria, microscopic 05/26/2013  . Renal insufficiency 05/24/2013  . Anemia 11/23/2012  . Abnormal TSH 11/23/2012  . Hypothyroid 11/23/2012  . GERD 11/29/2010  . CORONARY ARTERY DISEASE 04/18/2007  . Diabetes type 2, controlled 04/15/2007  . Hyperlipidemia 04/15/2007  . Essential hypertension 04/15/2007  . MYOCARDIAL INFARCTION, HX OF 04/15/2007  . ACTINIC KERATOSIS 04/15/2007   Past Medical History  Diagnosis Date  . Diabetes mellitus     type II  . Hyperlipidemia   . Hypertension   . CAD (coronary artery disease)   . Colon polyp   . Kidney stones   . Neuropathy    Past Surgical History  Procedure Laterality Date  . Appendectomy  1984  . Cataract extraction  04/2001  . Cholecystectomy  1989  . Retinal detachment surgery  2003  . Shoulder surgery  2012    Lt shoulder repair  . Colonoscopy N/A 2/14    hemorrhoids and tics (after pos ifob card)  . Esophagogastroduodenoscopy N/A 2/14   History  Substance Use Topics  . Smoking status: Never  Smoker   . Smokeless tobacco: Not on file  . Alcohol Use: No   Family History  Problem Relation Age of Onset  . Cancer Brother     prostate CA   Allergies  Allergen Reactions  . Azithromycin     REACTION: nausea  . Celecoxib Other (See Comments)    Raises blood pressure   Current Outpatient Prescriptions on File Prior to Visit  Medication Sig Dispense Refill  . acetaminophen (TYLENOL ARTHRITIS PAIN) 650 MG CR tablet Take 650 mg by mouth every 8 (eight) hours as needed.      Marland Kitchen amLODipine (NORVASC) 5 MG tablet TAKE 1 TABLET BY MOUTH ONCE A DAY 90 tablet 0  . aspirin 81 MG tablet Take 81 mg by mouth daily.      Marland Kitchen glimepiride (AMARYL) 4 MG tablet TAKE ONE TABLET BY MOUTH EVERY MORNING AND TAKE ONE TABLET BY MOUTH EVERY EVENING 60 tablet 6  . glucose blood (ONE TOUCH ULTRA TEST) test strip Check blood sugar twice a day and as directed. Dx E11.9 200 each 3  . guanFACINE (TENEX) 1 MG tablet TAKE 1  TABLET BY MOUTH DAILY AT BEDTIME 90 tablet 1  . Insulin Glargine (LANTUS) 100 UNIT/ML Solostar Pen Inject 12 Units into the skin every morning.    . iron polysaccharides (NIFEREX) 150 MG capsule Take 1 capsule (150 mg total) by mouth 2 (two) times daily. 30 capsule 3  . levothyroxine (SYNTHROID, LEVOTHROID) 25 MCG tablet Take 1 tablet (25 mcg total) by mouth daily before breakfast. 30 tablet 5  . lisinopril (PRINIVIL,ZESTRIL) 40 MG tablet TAKE 1/2 TABLET BY MOUTH ONCE A DAY 45 tablet 1  . Multiple Minerals-Vitamins (CITRACAL PLUS) TABS Take 1 tablet by mouth daily.      . Multiple Vitamin (MULTIVITAMIN) capsule Take 1 capsule by mouth daily.      . NON FORMULARY 100 BC ultra fine needles    . Omega-3 Fatty Acids (FISH OIL PO) Take 2 capsules by mouth daily.      Marland Kitchen omeprazole (PRILOSEC) 20 MG capsule TAKE 1 CAPSULE BY MOUTH ONCE DAILY 90 capsule 0  . simvastatin (ZOCOR) 40 MG tablet Take 0.5 tablets (20 mg total) by mouth daily. 45 tablet 1  . sitaGLIPtin (JANUVIA) 100 MG tablet Take 0.5 tablets (50 mg total) by mouth daily. 30 tablet 11  . Tamsulosin HCl (FLOMAX) 0.4 MG CAPS Take 0.4 mg by mouth daily.      . [DISCONTINUED] omeprazole (PRILOSEC OTC) 20 MG tablet Take 1 tablet (20 mg total) by mouth daily. 90 tablet 1   No current facility-administered medications on file prior to visit.     Review of Systems Review of Systems  Constitutional: Negative for fever, appetite change, fatigue and unexpected weight change.  Eyes: Negative for pain and visual disturbance.  Respiratory: Negative for cough and shortness of breath.   Cardiovascular: Negative for cp or palpitations    Gastrointestinal: Negative for nausea, diarrhea and constipation.  Genitourinary: Negative for urgency and frequency.  Skin: Negative for pallor or rash   Neurological: Negative for weakness, light-headedness, numbness and headaches.  Hematological: Negative for adenopathy. Does not bruise/bleed easily.    Psychiatric/Behavioral: Negative for dysphoric mood. The patient is not nervous/anxious.         Objective:   Physical Exam  Constitutional: He appears well-developed and well-nourished. No distress.  HENT:  Head: Normocephalic and atraumatic.  Mouth/Throat: Oropharynx is clear and moist.  Eyes: Conjunctivae and EOM are normal. Pupils are  equal, round, and reactive to light. No scleral icterus.  Neck: Normal range of motion. Neck supple. No JVD present. Carotid bruit is not present.  Cardiovascular: Normal rate, regular rhythm, normal heart sounds and intact distal pulses.  Exam reveals no gallop.   No murmur heard. Pulmonary/Chest: Effort normal and breath sounds normal. No respiratory distress. He has no wheezes. He has no rales.  Abdominal: Soft. Bowel sounds are normal. He exhibits no distension. There is no tenderness.  Musculoskeletal: He exhibits no edema.  Lymphadenopathy:    He has no cervical adenopathy.  Neurological: He is alert. He has normal reflexes. No cranial nerve deficit. He exhibits normal muscle tone. Coordination normal.  Skin: Skin is warm and dry. No rash noted. No erythema. No pallor.  Psychiatric: He has a normal mood and affect.          Assessment & Plan:   Problem List Items Addressed This Visit      Cardiovascular and Mediastinum   Essential hypertension - Primary    bp in fair control at this time  BP Readings from Last 1 Encounters:  03/13/15 122/58   No changes needed Disc lifstyle change with low sodium diet and exercise  Labs reviewed         Endocrine   Diabetes type 2, controlled    Lab Results  Component Value Date   HGBA1C 7.0* 03/09/2015   This seems to be the best control he can achieve without hypoglycemia  Rev low glycemic diet  Recent wt gain from inactivity in winter- is about to change Will continue watching glucose readings  F/u 6 mo with labs prior       Relevant Medications   sitaGLIPtin (JANUVIA) tablet    Hypothyroid    Lab Results  Component Value Date   TSH 2.17 03/09/2015   No clinical changes  No change in levothyroxine dose

## 2015-03-13 NOTE — Assessment & Plan Note (Signed)
bp in fair control at this time  BP Readings from Last 1 Encounters:  03/13/15 122/58   No changes needed Disc lifstyle change with low sodium diet and exercise  Labs reviewed

## 2015-03-13 NOTE — Progress Notes (Signed)
Pre visit review using our clinic review tool, if applicable. No additional management support is needed unless otherwise documented below in the visit note. 

## 2015-04-11 ENCOUNTER — Other Ambulatory Visit: Payer: Self-pay | Admitting: Family Medicine

## 2015-05-11 ENCOUNTER — Telehealth: Payer: Self-pay

## 2015-05-11 MED ORDER — ROSUVASTATIN CALCIUM 10 MG PO TABS: 5.0000 mg | ORAL_TABLET | ORAL | Status: DC

## 2015-05-11 NOTE — Telephone Encounter (Signed)
Mrs Wadding left note that pt stopped taking generic zocor one week ago due to muscle pain; muscle pain is much better now and request 90 day rx for different med sent to [facility]. Pt cannot take Lipitor or Zocor.Please advise.

## 2015-05-11 NOTE — Telephone Encounter (Signed)
Pt notified Rx sent to pharmacy and advise of Dr. Tower's comments  

## 2015-05-11 NOTE — Telephone Encounter (Signed)
I advise pt's wife about her starting crestor and she wanted me to ask you about her husband, please advise

## 2015-05-11 NOTE — Telephone Encounter (Signed)
I would also like to try him on low dose crestor-but not 90 day supply until we know if he tolerates it and how it works Start with crestor 5 mg every other day  Please check lipid/ast/alt in about a month if he stays on it  Update me with any side effects please  I sent a starter px to [facility] and if it works out will change to 90 day

## 2015-05-15 ENCOUNTER — Other Ambulatory Visit: Payer: Self-pay | Admitting: Family Medicine

## 2015-06-04 ENCOUNTER — Other Ambulatory Visit: Payer: Self-pay | Admitting: Family Medicine

## 2015-06-04 DIAGNOSIS — E785 Hyperlipidemia, unspecified: Secondary | ICD-10-CM

## 2015-06-11 ENCOUNTER — Other Ambulatory Visit: Payer: Self-pay | Admitting: Family Medicine

## 2015-06-12 ENCOUNTER — Other Ambulatory Visit (INDEPENDENT_AMBULATORY_CARE_PROVIDER_SITE_OTHER): Payer: Medicare Other

## 2015-06-12 DIAGNOSIS — E785 Hyperlipidemia, unspecified: Secondary | ICD-10-CM | POA: Diagnosis not present

## 2015-06-12 LAB — LIPID PANEL
CHOL/HDL RATIO: 3
CHOLESTEROL: 114 mg/dL (ref 0–200)
HDL: 33.9 mg/dL — AB (ref >39.00)
LDL Cholesterol: 46 mg/dL (ref 0–99)
NonHDL: 80.1
TRIGLYCERIDES: 171 mg/dL — AB (ref 0.0–149.0)
VLDL: 34.2 mg/dL (ref 0.0–40.0)

## 2015-06-12 LAB — ALT: ALT: 26 U/L (ref 0–53)

## 2015-06-12 LAB — AST: AST: 25 U/L (ref 0–37)

## 2015-06-13 ENCOUNTER — Encounter: Payer: Self-pay | Admitting: *Deleted

## 2015-06-13 ENCOUNTER — Other Ambulatory Visit: Payer: Self-pay | Admitting: Family Medicine

## 2015-06-14 MED ORDER — ROSUVASTATIN CALCIUM 5 MG PO TABS: 5.0000 mg | ORAL_TABLET | ORAL | Status: DC

## 2015-06-29 ENCOUNTER — Other Ambulatory Visit: Payer: Self-pay | Admitting: Family Medicine

## 2015-06-29 NOTE — Telephone Encounter (Signed)
Directions and dose different from med list, please advise

## 2015-06-29 NOTE — Telephone Encounter (Signed)
Please verify how much lantus he is taking and adj px  Can refill for a year  Thanks

## 2015-06-29 NOTE — Telephone Encounter (Signed)
Rx sent with correct dose

## 2015-07-16 ENCOUNTER — Other Ambulatory Visit: Payer: Self-pay | Admitting: Family Medicine

## 2015-08-06 ENCOUNTER — Encounter: Payer: Self-pay | Admitting: Family Medicine

## 2015-08-06 LAB — HM DIABETES EYE EXAM

## 2015-09-10 ENCOUNTER — Other Ambulatory Visit: Payer: Self-pay | Admitting: Family Medicine

## 2015-09-11 ENCOUNTER — Telehealth: Payer: Self-pay | Admitting: Family Medicine

## 2015-09-11 DIAGNOSIS — I10 Essential (primary) hypertension: Secondary | ICD-10-CM

## 2015-09-11 DIAGNOSIS — E785 Hyperlipidemia, unspecified: Secondary | ICD-10-CM

## 2015-09-11 DIAGNOSIS — E119 Type 2 diabetes mellitus without complications: Secondary | ICD-10-CM

## 2015-09-11 NOTE — Telephone Encounter (Signed)
-----   Message from Ellamae Sia sent at 09/06/2015 11:05 AM EDT ----- Regarding: Lab orders for Wednesday, 9.21.16 Lab orders for a 6 month follow up appt

## 2015-09-12 ENCOUNTER — Other Ambulatory Visit (INDEPENDENT_AMBULATORY_CARE_PROVIDER_SITE_OTHER): Payer: Medicare Other

## 2015-09-12 DIAGNOSIS — E785 Hyperlipidemia, unspecified: Secondary | ICD-10-CM

## 2015-09-12 DIAGNOSIS — I10 Essential (primary) hypertension: Secondary | ICD-10-CM

## 2015-09-12 DIAGNOSIS — E119 Type 2 diabetes mellitus without complications: Secondary | ICD-10-CM | POA: Diagnosis not present

## 2015-09-12 LAB — COMPREHENSIVE METABOLIC PANEL
ALT: 23 U/L (ref 0–53)
AST: 18 U/L (ref 0–37)
Albumin: 4.3 g/dL (ref 3.5–5.2)
Alkaline Phosphatase: 60 U/L (ref 39–117)
BILIRUBIN TOTAL: 0.9 mg/dL (ref 0.2–1.2)
BUN: 31 mg/dL — ABNORMAL HIGH (ref 6–23)
CALCIUM: 10.2 mg/dL (ref 8.4–10.5)
CHLORIDE: 109 meq/L (ref 96–112)
CO2: 30 meq/L (ref 19–32)
Creatinine, Ser: 1.65 mg/dL — ABNORMAL HIGH (ref 0.40–1.50)
GFR: 42.13 mL/min — AB (ref >60.00)
GLUCOSE: 81 mg/dL (ref 70–99)
POTASSIUM: 4.4 meq/L (ref 3.5–5.1)
Sodium: 144 mEq/L (ref 135–145)
Total Protein: 7.2 g/dL (ref 6.0–8.3)

## 2015-09-12 LAB — LIPID PANEL
Cholesterol: 104 mg/dL (ref 0–200)
HDL: 38 mg/dL — AB (ref >39.00)
LDL CALC: 48 mg/dL (ref 0–99)
NonHDL: 65.78
TRIGLYCERIDES: 91 mg/dL (ref 0.0–149.0)
Total CHOL/HDL Ratio: 3
VLDL: 18.2 mg/dL (ref 0.0–40.0)

## 2015-09-12 LAB — HEMOGLOBIN A1C: Hgb A1c MFr Bld: 6.5 % (ref 4.6–6.5)

## 2015-09-17 ENCOUNTER — Ambulatory Visit (INDEPENDENT_AMBULATORY_CARE_PROVIDER_SITE_OTHER): Payer: Medicare Other | Admitting: Family Medicine

## 2015-09-17 ENCOUNTER — Encounter: Payer: Self-pay | Admitting: Family Medicine

## 2015-09-17 VITALS — BP 124/56 | HR 58 | Temp 97.6°F | Ht 67.0 in | Wt 158.0 lb

## 2015-09-17 DIAGNOSIS — K5900 Constipation, unspecified: Secondary | ICD-10-CM

## 2015-09-17 DIAGNOSIS — Z23 Encounter for immunization: Secondary | ICD-10-CM | POA: Diagnosis not present

## 2015-09-17 DIAGNOSIS — I10 Essential (primary) hypertension: Secondary | ICD-10-CM | POA: Diagnosis not present

## 2015-09-17 DIAGNOSIS — E119 Type 2 diabetes mellitus without complications: Secondary | ICD-10-CM | POA: Diagnosis not present

## 2015-09-17 DIAGNOSIS — E785 Hyperlipidemia, unspecified: Secondary | ICD-10-CM | POA: Diagnosis not present

## 2015-09-17 MED ORDER — AMLODIPINE BESYLATE 5 MG PO TABS: 5.0000 mg | ORAL_TABLET | Freq: Every day | ORAL | Status: DC

## 2015-09-17 MED ORDER — ROSUVASTATIN CALCIUM 5 MG PO TABS: 5.0000 mg | ORAL_TABLET | ORAL | Status: DC

## 2015-09-17 MED ORDER — LISINOPRIL 40 MG PO TABS: ORAL_TABLET | ORAL | Status: DC

## 2015-09-17 MED ORDER — TAMSULOSIN HCL 0.4 MG PO CAPS
0.4000 mg | ORAL_CAPSULE | Freq: Every day | ORAL | Status: DC
Start: 2015-09-17 — End: 2016-11-05

## 2015-09-17 MED ORDER — SITAGLIPTIN PHOSPHATE 100 MG PO TABS: 50.0000 mg | ORAL_TABLET | Freq: Every day | ORAL | Status: DC

## 2015-09-17 MED ORDER — GUANFACINE HCL 1 MG PO TABS: 1.0000 mg | ORAL_TABLET | Freq: Every day | ORAL | Status: DC

## 2015-09-17 MED ORDER — LEVOTHYROXINE SODIUM 25 MCG PO TABS: ORAL_TABLET | ORAL | Status: DC

## 2015-09-17 MED ORDER — GLIMEPIRIDE 4 MG PO TABS: ORAL_TABLET | ORAL | Status: DC

## 2015-09-17 NOTE — Patient Instructions (Signed)
Get Miralax over the counter (store brand will be cheaper) Start with 2 doses a day as directed and when you start having regular bowel movements - then you can figure out how often you need it  Flu shot today  If new symptoms let me know- like abdominal pain or blood in stool  No longer fast for labs- just eat light  Diabetes is better  Follow up in 6 months for annual exam with labs prior

## 2015-09-17 NOTE — Assessment & Plan Note (Signed)
Disc goals for lipids and reasons to control them Rev labs with pt Rev low sat fat diet in detail  Good control with statin and diet  

## 2015-09-17 NOTE — Progress Notes (Signed)
Pre visit review using our clinic review tool, if applicable. No additional management support is needed unless otherwise documented below in the visit note. 

## 2015-09-17 NOTE — Assessment & Plan Note (Signed)
Improved Lab Results  Component Value Date   HGBA1C 6.5 09/12/2015   One episode of hypoglycemia-inst him to no longer fast for labs  opth utd  F/u 6 mo  No change in tx

## 2015-09-17 NOTE — Progress Notes (Signed)
Subjective:    Patient ID: David Henry, male    DOB: 1926-09-16, 81 y.o.   MRN: [id number]  HPI Here for f/u of chronic health problems   Since last visit he has had more constipation  Uses senkot-S when needed - has taken  for less than a month - (and also a stool softener with it) - takes it on and off  If he has no BM in 2-3 days will take it with water  For fiber -eats apples and apple sauce and some vegetables  No blood in stool  He stopped his iron  Lab Results  Component Value Date   WBC 7.7 12/01/2014   HGB 13.4 12/01/2014   HCT 41.1 12/01/2014   MCV 100.7* 12/01/2014   PLT 227.0 12/01/2014      Wt is up 3 lb with good BMI  Renal fxn is stable with cr 1.65-sees renal   Had his flu shot today  Hyperlipidemia with hx of CAD Lab Results  Component Value Date   CHOL 104 09/12/2015   CHOL 114 06/12/2015   CHOL 123 12/01/2014   Lab Results  Component Value Date   HDL 38.00* 09/12/2015   HDL 33.90* 06/12/2015   HDL 39.40 12/01/2014   Lab Results  Component Value Date   LDLCALC 48 09/12/2015   LDLCALC 46 06/12/2015   LDLCALC 62 12/01/2014   Lab Results  Component Value Date   TRIG 91.0 09/12/2015   TRIG 171.0* 06/12/2015   TRIG 106.0 12/01/2014   Lab Results  Component Value Date   CHOLHDL 3 09/12/2015   CHOLHDL 3 06/12/2015   CHOLHDL 3 12/01/2014   No results found for: LDLDIRECT  On crestor and diet   Diabetes Home sugar results  DM diet - very good  Exercise  Symptoms- has had a low sugar one time -when he was fasting for labs (no other times)  A1C last  Lab Results  Component Value Date   HGBA1C 6.5 09/12/2015   This is down from 7 No problems with medications -is taking amaryl, lantus, and Januvia Renal protection-on ace  Last eye exam 8/16  bp is stable today  No cp or palpitations or headaches or edema  No side effects to medicines  BP Readings from Last 3 Encounters:  09/17/15 124/56  03/13/15 122/58  12/08/14 116/60           Review of Systems Review of Systems  Constitutional: Negative for fever, appetite change, fatigue and unexpected weight change.  Eyes: Negative for pain and visual disturbance.  Respiratory: Negative for cough and shortness of breath.   Cardiovascular: Negative for cp or palpitations    Gastrointestinal: Negative for nausea, diarrhea and pos for constipation. neg for blood in stool  Genitourinary: Negative for urgency and frequency.  Skin: Negative for pallor or rash   Neurological: Negative for weakness, light-headedness, numbness and headaches.  Hematological: Negative for adenopathy. Does not bruise/bleed easily.  Psychiatric/Behavioral: Negative for dysphoric mood. The patient is not nervous/anxious.         Objective:   Physical Exam  Constitutional: He appears well-developed and well-nourished. No distress.  Well appearing   HENT:  Head: Normocephalic and atraumatic.  Mouth/Throat: Oropharynx is clear and moist.  Eyes: Conjunctivae and EOM are normal. Pupils are equal, round, and reactive to light.  Neck: Normal range of motion. Neck supple. No JVD present. Carotid bruit is not present. No thyromegaly present.  Cardiovascular: Normal rate, regular rhythm, normal heart  sounds and intact distal pulses.  Exam reveals no gallop.   Pulmonary/Chest: Effort normal and breath sounds normal. No respiratory distress. He has no wheezes. He has no rales.  No crackles  Abdominal: Soft. Bowel sounds are normal. He exhibits no distension, no abdominal bruit and no mass. There is no tenderness. There is no rebound and no guarding.  Musculoskeletal: He exhibits no edema.  Lymphadenopathy:    He has no cervical adenopathy.  Neurological: He is alert. He has normal reflexes.  Skin: Skin is warm and dry. No rash noted.  Psychiatric: He has a normal mood and affect.          Assessment & Plan:   Problem List Items Addressed This Visit      Cardiovascular and Mediastinum    Essential hypertension - Primary    bp in fair control at this time  BP Readings from Last 1 Encounters:  09/17/15 124/56   No changes needed Disc lifstyle change with low sodium diet and exercise  Labs reviewed F/u 6 mo       Relevant Medications   rosuvastatin (CRESTOR) 5 MG tablet   lisinopril (PRINIVIL,ZESTRIL) 40 MG tablet   guanFACINE (TENEX) 1 MG tablet   amLODipine (NORVASC) 5 MG tablet     Digestive   Constipation    Worse lately -no abd pain or blood in stool  Trial of miralax-to start with bid and titrate to regular bms - getting on a schedule Disc imp of fluids and fiber Update if not starting to improve in a week or if worsening          Endocrine   Diabetes type 2, controlled    Improved Lab Results  Component Value Date   HGBA1C 6.5 09/12/2015   One episode of hypoglycemia-inst him to no longer fast for labs  opth utd  F/u 6 mo  No change in tx       Relevant Medications   sitaGLIPtin (JANUVIA) 100 MG tablet   rosuvastatin (CRESTOR) 5 MG tablet   lisinopril (PRINIVIL,ZESTRIL) 40 MG tablet   glimepiride (AMARYL) 4 MG tablet     Other   Hyperlipidemia    Disc goals for lipids and reasons to control them Rev labs with pt Rev low sat fat diet in detail Good control with statin and diet       Relevant Medications   rosuvastatin (CRESTOR) 5 MG tablet   lisinopril (PRINIVIL,ZESTRIL) 40 MG tablet   guanFACINE (TENEX) 1 MG tablet   amLODipine (NORVASC) 5 MG tablet    Other Visit Diagnoses    Need for influenza vaccination        Relevant Orders    Flu Vaccine QUAD 36+ mos PF IM (Fluarix & Fluzone Quad PF) (Completed)

## 2015-09-17 NOTE — Assessment & Plan Note (Signed)
Worse lately -no abd pain or blood in stool  Trial of miralax-to start with bid and titrate to regular bms - getting on a schedule Disc imp of fluids and fiber Update if not starting to improve in a week or if worsening

## 2015-09-17 NOTE — Assessment & Plan Note (Signed)
bp in fair control at this time  BP Readings from Last 1 Encounters:  09/17/15 124/56   No changes needed Disc lifstyle change with low sodium diet and exercise  Labs reviewed F/u 6 mo

## 2015-10-05 ENCOUNTER — Other Ambulatory Visit: Payer: Self-pay | Admitting: Family Medicine

## 2015-10-29 ENCOUNTER — Other Ambulatory Visit: Payer: Self-pay | Admitting: Family Medicine

## 2016-01-03 ENCOUNTER — Other Ambulatory Visit: Payer: Self-pay | Admitting: Family Medicine

## 2016-02-14 ENCOUNTER — Other Ambulatory Visit: Payer: Self-pay

## 2016-03-09 ENCOUNTER — Telehealth: Payer: Self-pay | Admitting: Family Medicine

## 2016-03-09 DIAGNOSIS — E119 Type 2 diabetes mellitus without complications: Secondary | ICD-10-CM

## 2016-03-09 DIAGNOSIS — Z0000 Encounter for general adult medical examination without abnormal findings: Secondary | ICD-10-CM

## 2016-03-09 NOTE — Telephone Encounter (Signed)
-----   Message from Ellamae Sia sent at 03/05/2016 11:21 AM EDT ----- Regarding: Lab orders for Wednesday, 3.22.17  AWV lab orders, please.

## 2016-03-11 ENCOUNTER — Other Ambulatory Visit: Payer: Self-pay | Admitting: Family Medicine

## 2016-03-11 NOTE — Telephone Encounter (Signed)
Last refill was for 1/2 tab. Is pt suppose to be on whole tab or 1/2 tab, please advise, pt dose have AWV and CPE scheduled with you and Lattie Haw on 03/18/16

## 2016-03-11 NOTE — Telephone Encounter (Signed)
He was on 1/2 of the 100 - so that would be one of the 50 mg  Can double check with the patient  Refill for a year thanks

## 2016-03-12 ENCOUNTER — Other Ambulatory Visit (INDEPENDENT_AMBULATORY_CARE_PROVIDER_SITE_OTHER): Payer: Medicare Other

## 2016-03-12 DIAGNOSIS — Z0000 Encounter for general adult medical examination without abnormal findings: Secondary | ICD-10-CM

## 2016-03-12 DIAGNOSIS — E119 Type 2 diabetes mellitus without complications: Secondary | ICD-10-CM | POA: Diagnosis not present

## 2016-03-12 LAB — COMPREHENSIVE METABOLIC PANEL
ALBUMIN: 4 g/dL (ref 3.5–5.2)
ALK PHOS: 59 U/L (ref 39–117)
ALT: 19 U/L (ref 0–53)
AST: 17 U/L (ref 0–37)
BILIRUBIN TOTAL: 0.8 mg/dL (ref 0.2–1.2)
BUN: 39 mg/dL — AB (ref 6–23)
CO2: 26 mEq/L (ref 19–32)
Calcium: 10 mg/dL (ref 8.4–10.5)
Chloride: 109 mEq/L (ref 96–112)
Creatinine, Ser: 1.88 mg/dL — ABNORMAL HIGH (ref 0.40–1.50)
GFR: 36.2 mL/min — AB (ref >60.00)
Glucose, Bld: 75 mg/dL (ref 70–99)
POTASSIUM: 4.4 meq/L (ref 3.5–5.1)
SODIUM: 142 meq/L (ref 135–145)
TOTAL PROTEIN: 6.8 g/dL (ref 6.0–8.3)

## 2016-03-12 LAB — CBC WITH DIFFERENTIAL/PLATELET
Basophils Absolute: 0 10*3/uL (ref 0.0–0.1)
Basophils Relative: 0.3 % (ref 0.0–3.0)
EOS PCT: 2.7 % (ref 0.0–5.0)
Eosinophils Absolute: 0.2 10*3/uL (ref 0.0–0.7)
HCT: 37 % — ABNORMAL LOW (ref 39.0–52.0)
HEMOGLOBIN: 12.4 g/dL — AB (ref 13.0–17.0)
LYMPHS PCT: 20.5 % (ref 12.0–46.0)
Lymphs Abs: 1.7 10*3/uL (ref 0.7–4.0)
MCHC: 33.6 g/dL (ref 30.0–36.0)
MCV: 98.1 fl (ref 78.0–100.0)
MONOS PCT: 8.4 % (ref 3.0–12.0)
Monocytes Absolute: 0.7 10*3/uL (ref 0.1–1.0)
Neutro Abs: 5.6 10*3/uL (ref 1.4–7.7)
Neutrophils Relative %: 68.1 % (ref 43.0–77.0)
Platelets: 227 10*3/uL (ref 150.0–400.0)
RBC: 3.78 Mil/uL — AB (ref 4.22–5.81)
RDW: 14 % (ref 11.5–15.5)
WBC: 8.2 10*3/uL (ref 4.0–10.5)

## 2016-03-12 LAB — TSH: TSH: 2.63 u[IU]/mL (ref 0.35–4.50)

## 2016-03-12 LAB — HEMOGLOBIN A1C: Hgb A1c MFr Bld: 7.2 % — ABNORMAL HIGH (ref 4.6–6.5)

## 2016-03-12 LAB — LIPID PANEL
CHOLESTEROL: 107 mg/dL (ref 0–200)
HDL: 40 mg/dL (ref >39.00)
LDL Cholesterol: 48 mg/dL (ref 0–99)
NonHDL: 67.11
Total CHOL/HDL Ratio: 3
Triglycerides: 97 mg/dL (ref 0.0–149.0)
VLDL: 19.4 mg/dL (ref 0.0–40.0)

## 2016-03-12 NOTE — Telephone Encounter (Signed)
Pt was here for labs and did confirm he is taking 50mg  one tablet a day, Rx refilled

## 2016-03-18 ENCOUNTER — Ambulatory Visit (INDEPENDENT_AMBULATORY_CARE_PROVIDER_SITE_OTHER): Payer: Medicare Other | Admitting: Family Medicine

## 2016-03-18 ENCOUNTER — Ambulatory Visit (INDEPENDENT_AMBULATORY_CARE_PROVIDER_SITE_OTHER): Payer: Medicare Other

## 2016-03-18 ENCOUNTER — Encounter: Payer: Self-pay | Admitting: Family Medicine

## 2016-03-18 VITALS — BP 120/50 | HR 56 | Temp 98.1°F | Ht 67.0 in | Wt 158.5 lb

## 2016-03-18 DIAGNOSIS — E119 Type 2 diabetes mellitus without complications: Secondary | ICD-10-CM

## 2016-03-18 DIAGNOSIS — K5900 Constipation, unspecified: Secondary | ICD-10-CM

## 2016-03-18 DIAGNOSIS — D649 Anemia, unspecified: Secondary | ICD-10-CM

## 2016-03-18 DIAGNOSIS — I10 Essential (primary) hypertension: Secondary | ICD-10-CM | POA: Diagnosis not present

## 2016-03-18 DIAGNOSIS — Z0000 Encounter for general adult medical examination without abnormal findings: Secondary | ICD-10-CM

## 2016-03-18 DIAGNOSIS — E039 Hypothyroidism, unspecified: Secondary | ICD-10-CM

## 2016-03-18 DIAGNOSIS — K219 Gastro-esophageal reflux disease without esophagitis: Secondary | ICD-10-CM

## 2016-03-18 DIAGNOSIS — N289 Disorder of kidney and ureter, unspecified: Secondary | ICD-10-CM

## 2016-03-18 DIAGNOSIS — E785 Hyperlipidemia, unspecified: Secondary | ICD-10-CM

## 2016-03-18 MED ORDER — ROSUVASTATIN CALCIUM 5 MG PO TABS: 5.0000 mg | ORAL_TABLET | ORAL | Status: DC

## 2016-03-18 NOTE — Assessment & Plan Note (Signed)
Off prilosec due to renal issues Having significant heartburn and laryngeal symptoms  He will ask nephrologist about zantac

## 2016-03-18 NOTE — Assessment & Plan Note (Signed)
Well controlled with crestor qod and diet Disc goals for lipids and reasons to control them Rev labs with pt Rev low sat fat diet in detail

## 2016-03-18 NOTE — Progress Notes (Signed)
Pre visit review using our clinic review tool, if applicable. No additional management support is needed unless otherwise documented below in the visit note. 

## 2016-03-18 NOTE — Patient Instructions (Addendum)
Ask the kidney doctor if it is ok to take zantac for your acid reflux -we want to control that  Try to drink more water - aim for 8 servings of water per day  For diabetes- please start checking some blood sugars in the afternoon or mid day to see if we can figure out when they are high  To avoid low sugar at night - eat a small bedtime snack with protein  See the kidney specialist as planned  Take care of yourself -try to stick to a diabetic diet   Follow up in 3 months with labs prior

## 2016-03-18 NOTE — Assessment & Plan Note (Signed)
Reviewed health habits including diet and exercise and skin cancer prevention Reviewed appropriate screening tests for age  Also reviewed health mt list, fam hx and immunization status , as well as social and family history   Reviewed AMW visit and recommendations See HPI Labs reviewed Ask the kidney doctor if it is ok to take zantac for your acid reflux -we want to control that  Try to drink more water - aim for 8 servings of water per day  For diabetes- please start checking some blood sugars in the afternoon or mid day to see if we can figure out when they are high  To avoid low sugar at night - eat a small bedtime snack with protein  See the kidney specialist as planned  Take care of yourself -try to stick to a diabetic diet

## 2016-03-18 NOTE — Assessment & Plan Note (Signed)
Lab Results  Component Value Date   CREATININE 1.88* 03/12/2016   Seeing renal  No symptoms  For f/u this week Disc water intake

## 2016-03-18 NOTE — Progress Notes (Signed)
Subjective:    Patient ID: David Henry, male    DOB: [birth year], 82 y.o.   MRN: [id number]  HPI Here for health maintenance exam and to review chronic medical problems    Reviewed AMW visit with Katha Cabal today Hearing loss noted - only heard at 2000 Hz He thinks it is the machine that is the problem- no issues with hearing and does not want further eval   opthy was 8/16 up to date   Wt is stable with bmi of 24  utd imms Colonoscopy 1/14 nl  He has noticed constipation  Taking senekot twice daily for a month for constipation  Drinking a lot of water  Also eats his vegetables and fruit   Hx of CKD stage 3 Lab Results  Component Value Date   CREATININE 1.88* 03/12/2016   BUN 39* 03/12/2016   NA 142 03/12/2016   K 4.4 03/12/2016   CL 109 03/12/2016   CO2 26 03/12/2016   has appointment to see renal on Thursday - Dr Vito Backers him off of prilosec due to calcium intake - will ask about zantac for heartburn  Also has throat is bothering him- spits out mucous (? 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, [employer] (takes breaks for back pain)   Has spinal stenosis- makes R upper leg numb     Hypothyroidism  Pt has no clinical changes No change in energy level/ hair or skin/ edema and no tremor Lab Results  Component Value Date   TSH 2.63 03/12/2016     Hyperlipidemia With hx of MI/ CAD Lab Results  Component Value Date   CHOL 107 03/12/2016   CHOL 104 09/12/2015   CHOL 114 06/12/2015    Lab Results  Component Value Date   HDL 40.00 03/12/2016   HDL 38.00* 09/12/2015   HDL 33.90* 06/12/2015   Lab Results  Component Value Date   LDLCALC 48 03/12/2016   [place] 48 09/12/2015   LDLCALC 46 06/12/2015   Lab Results  Component Value Date   TRIG 97.0 03/12/2016   TRIG 91.0 09/12/2015   TRIG 171.0* 06/12/2015   Lab Results  Component Value Date   CHOLHDL 3 03/12/2016   CHOLHDL 3 09/12/2015   CHOLHDL 3 06/12/2015   No results found for: LDLDIRECT  On crestor and diet  Doing great- good profile , HDL is up   BPH-on flomax It still works well    bp is stable today  No cp or palpitations or headaches or edema  No side effects to medicines  BP Readings from Last 3 Encounters:  03/18/16 120/50  03/18/16 120/50  09/17/15 124/56      Patient Active Problem List   Diagnosis Date Noted  . Routine general medical examination at a health care facility 03/09/2016  . Constipation 09/17/2015  . Encounter for Medicare  annual wellness exam 12/08/2014  . Hematuria, microscopic 05/26/2013  . Renal insufficiency 05/24/2013  . Anemia 11/23/2012  . Abnormal TSH 11/23/2012  . Hypothyroid 11/23/2012  . GERD 11/29/2010  . CORONARY ARTERY DISEASE 04/18/2007  . Diabetes type 2, controlled ([place]) 04/15/2007  . Hyperlipidemia 04/15/2007  . Essential hypertension 04/15/2007  . MYOCARDIAL INFARCTION, HX OF 04/15/2007  . ACTINIC KERATOSIS 04/15/2007   Past Medical History  Diagnosis Date  . Diabetes mellitus     type II  . Hyperlipidemia   . Hypertension   . CAD (coronary artery disease)   . Colon polyp   . Kidney stones   . Neuropathy [facility])    Past Surgical History  Procedure Laterality Date  . Appendectomy  1984  . Cataract extraction  04/2001  . Cholecystectomy  1989  . Retinal detachment surgery  2003  . Shoulder surgery  2012    Lt shoulder repair  . Colonoscopy N/A 2/14    hemorrhoids and tics (after pos ifob card)  . Esophagogastroduodenoscopy N/A  2/14   Social History  Substance Use Topics  . Smoking status: Never Smoker   . Smokeless tobacco: None  . Alcohol Use: No   Family History  Problem Relation Age of Onset  . Cancer Brother     prostate CA  . Heart disease Mother   . Stroke Father   . Kidney disease Sister   . Kidney disease Sister   . Clotting disorder Sister   . COPD Brother   . Heart disease Brother    Allergies  Allergen Reactions  . Azithromycin     REACTION: nausea  . Celecoxib Other (See Comments)    Raises blood pressure  . Zocor [Simvastatin] Other (See Comments)    Muscle pain   Current Outpatient Prescriptions on File Prior to Visit  Medication Sig Dispense Refill  . acetaminophen (TYLENOL ARTHRITIS PAIN) 650 MG CR tablet Take 650 mg by mouth every 8 (eight) hours as needed.      Marland Kitchen amLODipine (NORVASC) 5 MG tablet Take 1 tablet (5 mg total) by mouth daily. 90 tablet 3  . aspirin 81 MG tablet Take 81 mg by mouth daily.      . BD PEN NEEDLE NANO U/F 32G X 4 MM MISC USE AS DIRECTED WITH LANTUS 100 each 1  . glimepiride (AMARYL) 4 MG tablet TAKE ONE TABLET BY MOUTH EVERY MORNING AND TAKE ONE TABLET BY MOUTH EVERY EVENING 180 tablet 3  . glucose blood (ONE TOUCH ULTRA TEST) test strip CHECK BLOOD SUGAR TWICE DAILY AND AS DIRECTED FOR DM (DX. E11.9) 200 each 1  . guanFACINE (TENEX) 1 MG tablet Take 1 tablet (1 mg total) by mouth at bedtime. 90 tablet 3  . Insulin Glargine (LANTUS SOLOSTAR) 100 UNIT/ML Solostar Pen Inject 12 Units into the skin every morning. 15 mL 11  . JANUVIA 50 MG tablet TAKE 1 TABLET BY MOUTH ONCE A DAY 90 tablet 3  . levothyroxine (SYNTHROID, LEVOTHROID) 25 MCG tablet TAKE 1 TABLET BY MOUTH ONCE A DAY BEFOREBREAKFAST 90 tablet 3  . lisinopril (PRINIVIL,ZESTRIL) 40 MG tablet TAKE 1/2 A TABLET BY MOUTH DAILY 45 tablet 3  . Multiple Vitamin (MULTIVITAMIN) capsule Take 1 capsule by mouth daily.      . NON FORMULARY 100 BC ultra fine needles    . Omega-3 Fatty Acids (FISH OIL PO) Take  2 capsules by mouth daily.      . tamsulosin (FLOMAX) 0.4 MG CAPS capsule Take 1 capsule (  0.4 mg total) by mouth daily. 90 capsule 3  . [DISCONTINUED] omeprazole (PRILOSEC OTC) 20 MG tablet Take 1 tablet (20 mg total) by mouth daily. 90 tablet 1   No current facility-administered medications on file prior to visit.    Review of Systems    Review of Systems  Constitutional: Negative for fever, appetite change, fatigue and unexpected weight change.  Eyes: Negative for pain and visual disturbance.  Respiratory: Negative for cough and shortness of breath.   Cardiovascular: Negative for cp or palpitations    Gastrointestinal: Negative for nausea, diarrhea and pos for heartburn and constipation.  Genitourinary: Negative for urgency and frequency.  Skin: Negative for pallor or rash   Neurological: Negative for weakness, light-headedness, numbness and headaches.  Hematological: Negative for adenopathy. Does not bruise/bleed easily.  Psychiatric/Behavioral: Negative for dysphoric mood. The patient is not nervous/anxious.      Objective:   Physical Exam  Constitutional: He appears well-developed and well-nourished. No distress.  Well appearing elderly male   HENT:  Head: Normocephalic and atraumatic.  Right Ear: External ear normal.  Left Ear: External ear normal.  Nose: Nose normal.  Mouth/Throat: Oropharynx is clear and moist.  Eyes: Conjunctivae and EOM are normal. Pupils are equal, round, and reactive to light. Right eye exhibits no discharge. Left eye exhibits no discharge. No scleral icterus.  Neck: Normal range of motion. Neck supple. No JVD present. Carotid bruit is not present. No thyromegaly present.  Cardiovascular: Normal rate, regular rhythm, normal heart sounds and intact distal pulses.  Exam reveals no gallop.   Pulmonary/Chest: Effort normal and breath sounds normal. No respiratory distress. He has no wheezes. He exhibits no tenderness.  Abdominal: Soft. Bowel sounds are  normal. He exhibits no distension, no abdominal bruit and no mass. There is no tenderness.  Musculoskeletal: He exhibits no edema or tenderness.  Lymphadenopathy:    He has no cervical adenopathy.  Neurological: He is alert. He has normal reflexes. No cranial nerve deficit. He exhibits normal muscle tone. Coordination normal.  Skin: Skin is warm and dry. No rash noted. No erythema. No pallor.  Psychiatric: He has a normal mood and affect.          Assessment & Plan:   Problem List Items Addressed This Visit      Cardiovascular and Mediastinum   Essential hypertension - Primary    bp in fair control at this time  BP Readings from Last 1 Encounters:  03/18/16 120/50   No changes needed Disc lifstyle change with low sodium diet and exercise  Labs reviewed       Relevant Medications   rosuvastatin (CRESTOR) 5 MG tablet     Digestive   Constipation    Continue good water intake and senekot prn Update if worse  Rev last colonoscopy      GERD    Off prilosec due to renal issues Having significant heartburn and laryngeal symptoms  He will ask nephrologist about zantac         Endocrine   Diabetes type 2, controlled ([place])    Lab Results  Component Value Date   HGBA1C 7.2* 03/12/2016   This is up  Less active in the winter  Adv to check glucose more often during the day (and follow dm diet) Start bedtime snack with protein to avoid hypoglycemia at night  No change in medicines  F/u 3 mo with lab prior       Relevant Medications   rosuvastatin (CRESTOR) 5  MG tablet   Hypothyroid    Hypothyroidism  Pt has no clinical changes No change in energy level/ hair or skin/ edema and no tremor Lab Results  Component Value Date   TSH 2.63 03/12/2016            Genitourinary   Renal insufficiency    Lab Results  Component Value Date   CREATININE 1.88* 03/12/2016   Seeing renal  No symptoms  For f/u this week Disc water intake          Other   Anemia     Mild today - not unusual  Has seen heme in the past  Renal insuff may play a role as well  Continue to follow       Hyperlipidemia    Well controlled with crestor qod and diet Disc goals for lipids and reasons to control them Rev labs with pt Rev low sat fat diet in detail       Relevant Medications   rosuvastatin (CRESTOR) 5 MG tablet   Routine general medical examination at a health care facility    Reviewed health habits including diet and exercise and skin cancer prevention Reviewed appropriate screening tests for age  Also reviewed health mt list, fam hx and immunization status , as well as social and family history   Reviewed AMW visit and recommendations See HPI Labs reviewed Ask the kidney doctor if it is ok to take zantac for your acid reflux -we want to control that  Try to drink more water - aim for 8 servings of water per day  For diabetes- please start checking some blood sugars in the afternoon or mid day to see if we can figure out when they are high  To avoid low sugar at night - eat a small bedtime snack with protein  See the kidney specialist as planned  Take care of yourself -try to stick to a diabetic diet

## 2016-03-18 NOTE — Assessment & Plan Note (Signed)
bp in fair control at this time  BP Readings from Last 1 Encounters:  03/18/16 120/50   No changes needed Disc lifstyle change with low sodium diet and exercise  Labs reviewed

## 2016-03-18 NOTE — Assessment & Plan Note (Signed)
Continue good water intake and senekot prn Update if worse  Rev last colonoscopy

## 2016-03-18 NOTE — Progress Notes (Signed)
Subjective:   David Henry is a 82 y.o. male who presents for Medicare Annual/Subsequent preventive examination.   Cardiac Risk Factors include: advanced age (>35men, >82 women);diabetes mellitus;dyslipidemia;hypertension;family history of premature cardiovascular disease;male gender     Objective:    Vitals: BP 120/50 mmHg  Pulse 56  Temp(Src) 98.1 F (36.7 C) (Oral)  Ht 5\' 7"  (1.702 m)  Wt 158 lb 8 oz (71.895 kg)  BMI 24.82 kg/m2  SpO2 98%  Body mass index is 24.82 kg/(m^2).  Tobacco History  Smoking status  . Never Smoker   Smokeless tobacco  . Not on file     Counseling given: No   Past Medical History  Diagnosis Date  . Diabetes mellitus     type II  . Hyperlipidemia   . Hypertension   . CAD (coronary artery disease)   . Colon polyp   . Kidney stones   . Neuropathy [facility])    Past Surgical History  Procedure Laterality Date  . Appendectomy  1984  . Cataract extraction  04/2001  . Cholecystectomy  1989  . Retinal detachment surgery  2003  . Shoulder surgery  2012    Lt shoulder repair  . Colonoscopy N/A 2/14    hemorrhoids and tics (after pos ifob card)  . Esophagogastroduodenoscopy N/A 2/14   Family History  Problem Relation Age of Onset  . Cancer Brother     prostate CA  . Heart disease Mother   . Stroke Father   . Kidney disease Sister   . Kidney disease Sister   . Clotting disorder Sister   . COPD Brother   . Heart disease Brother    History  Sexual Activity  . Sexual Activity: No    Outpatient Encounter Prescriptions as of 03/18/2016  Medication Sig  . acetaminophen (TYLENOL ARTHRITIS PAIN) 650 MG CR tablet Take 650 mg by mouth every 8 (eight) hours as needed.    Marland Kitchen amLODipine (NORVASC) 5 MG tablet Take 1 tablet (5 mg total) by mouth daily.  Marland Kitchen aspirin 81 MG tablet Take 81 mg by mouth daily.    . BD PEN NEEDLE NANO U/F 32G X 4 MM MISC USE AS DIRECTED WITH LANTUS  . glimepiride (AMARYL) 4 MG tablet TAKE ONE TABLET BY MOUTH EVERY  MORNING AND TAKE ONE TABLET BY MOUTH EVERY EVENING  . glucose blood (ONE TOUCH ULTRA TEST) test strip CHECK BLOOD SUGAR TWICE DAILY AND AS DIRECTED FOR DM (DX. E11.9)  . guanFACINE (TENEX) 1 MG tablet Take 1 tablet (1 mg total) by mouth at bedtime.  . Insulin Glargine (LANTUS SOLOSTAR) 100 UNIT/ML Solostar Pen Inject 12 Units into the skin every morning.  Marland Kitchen JANUVIA 50 MG tablet TAKE 1 TABLET BY MOUTH ONCE A DAY  . levothyroxine (SYNTHROID, LEVOTHROID) 25 MCG tablet TAKE 1 TABLET BY MOUTH ONCE A DAY BEFOREBREAKFAST  . lisinopril (PRINIVIL,ZESTRIL) 40 MG tablet TAKE 1/2 A TABLET BY MOUTH DAILY  . Multiple Vitamin (MULTIVITAMIN) capsule Take 1 capsule by mouth daily.    . NON FORMULARY 100 BC ultra fine needles  . Omega-3 Fatty Acids (FISH OIL PO) Take 2 capsules by mouth daily.    . rosuvastatin (CRESTOR) 5 MG tablet Take 1 tablet (5 mg total) by mouth every other day.  . tamsulosin (FLOMAX) 0.4 MG CAPS capsule Take 1 capsule (0.4 mg total) by mouth daily.  . [DISCONTINUED] Multiple Minerals-Vitamins (CITRACAL PLUS) TABS Take 1 tablet by mouth daily.     No facility-administered encounter medications on  file as of 03/18/2016.    Activities of Daily Living In your present state of health, do you have any difficulty performing the following activities: 03/18/2016  Hearing? N  Vision? N  Difficulty concentrating or making decisions? N  Walking or climbing stairs? N  Dressing or bathing? N  Doing errands, shopping? N  Preparing Food and eating ? N  Using the Toilet? N  In the past six months, have you accidently leaked urine? N  Do you have problems with loss of bowel control? N  Managing your Medications? N  Managing your Finances? N  Housekeeping or managing your Housekeeping? N    Patient Care Team: Abner Greenspan, MD as PCP - General   Dr. Wallace Going - Ophthalmology Dr. Candiss Norse - Nephrology Dr. Drema Dallas - Cardiology  Assessment:     Hearing Screening   125Hz  250Hz  500Hz  1000Hz   2000Hz  4000Hz  8000Hz   Right ear:   0 0 40 0   Left ear:   0 0 40 0   Vision Screening Comments: Last eye exam in August 2016   Exercise Activities and Dietary recommendations Current Exercise Habits: Home exercise routine, Type of exercise: Other - see comments (stationary bike; yard work), Time (Minutes): 15, Frequency (Times/Week): 2, Weekly Exercise (Minutes/Week): 30, Intensity: Mild, Exercise limited by: None identified  Goals    None - Pt declined     Fall Risk Fall Risk  03/18/2016 03/18/2016 12/08/2014 11/23/2012  Falls in the past year? No No No No   Depression Screen PHQ 2/9 Scores 03/18/2016 03/18/2016 12/08/2014 11/23/2012  PHQ - 2 Score 0 0 0 0    Cognitive Testing MMSE - Mini Mental State Exam 03/18/2016  Orientation to time 5  Orientation to Place 5  Registration 3  Attention/ Calculation 0  Recall 3  Language- name 2 objects 0  Language- repeat 1  Language- follow 3 step command 3  Language- read & follow direction 0  Write a sentence 0  Copy design 0  Total score 20   PLEASE NOTE: A Mini-Cog screen was completed. Maximum score is 20. A value of 0 denotes this part of Folstein MMSE was not completed.  Orientation to Time - Max 5 Orientation to Place - Max 5 Registration - Max 3 Recall - Max 3 Language Repeat - Max 1 Language Follow 3 Step Command - Max 3  Immunization History  Administered Date(s) Administered  . H1N1 12/28/2008  . Influenza Split 10/23/2011, 09/15/2012  . Influenza Whole 09/22/1999, 10/01/2007, 10/02/2008, 09/27/2009, 10/09/2010  . Influenza,inj,Quad PF,36+ Mos 09/13/2014, 09/17/2015  . Influenza-Unspecified 09/20/2013  . Pneumococcal Conjugate-13 12/08/2014  . Pneumococcal Polysaccharide-23 07/23/2003  . Td 12/04/1999, 03/01/2008  . Tdap 05/20/2011   Screening Tests Health Maintenance  Topic Date Due  . INFLUENZA VACCINE  07/22/2016  . OPHTHALMOLOGY EXAM  08/05/2016  . HEMOGLOBIN A1C  09/12/2016  . FOOT EXAM  09/16/2016  .  TETANUS/TDAP  05/19/2021  . ZOSTAVAX  Addressed  . PNA vac Low Risk Adult  Completed      Plan:     I have personally reviewed and addressed the Medicare Annual Wellness questionnaire and have noted the following in the patient's chart:  A. Medical and social history B. Use of alcohol, tobacco or illicit drugs  C. Current medications and supplements D. Functional ability and status E.  Nutritional status F.  Physical activity G. Advance directives H. List of other physicians I.  Hospitalizations, surgeries, and ER visits in previous 12 months J.  Vitals K. Screenings to include hearing, vision, cognitive, depression L. Referrals and appointments - none  In addition, I have reviewed and discussed with patient certain preventive protocols, quality metrics, and best practice recommendations. A written personalized care plan for preventive services as well as general preventive health recommendations were provided to patient.  See attached scanned questionnaire for additional information.   Signed,   Lindell Noe, MHA, BS, LPN Health Advisor [id number]

## 2016-03-18 NOTE — Assessment & Plan Note (Signed)
Hypothyroidism  Pt has no clinical changes No change in energy level/ hair or skin/ edema and no tremor Lab Results  Component Value Date   TSH 2.63 03/12/2016

## 2016-03-18 NOTE — Assessment & Plan Note (Signed)
Lab Results  Component Value Date   HGBA1C 7.2* 03/12/2016   This is up  Less active in the winter  Adv to check glucose more often during the day (and follow dm diet) Start bedtime snack with protein to avoid hypoglycemia at night  No change in medicines  F/u 3 mo with lab prior

## 2016-03-18 NOTE — Assessment & Plan Note (Signed)
Mild today - not unusual  Has seen heme in the past  Renal insuff may play a role as well  Continue to follow

## 2016-03-18 NOTE — Patient Instructions (Addendum)
[name] , Thank you for taking time to come for your Medicare Wellness Visit. I appreciate your ongoing commitment to your health goals. Please review the following plan we discussed and let me know if I can assist you in the future.    This is a list of the screening recommended for you and due dates:  Health Maintenance  Topic Date Due  . Flu Shot  07/22/2016  . Eye exam for diabetics  08/05/2016  . Hemoglobin A1C  09/12/2016  . Complete foot exam   09/16/2016  . Tetanus Vaccine  05/19/2021  . Shingles Vaccine  Addressed  . Pneumonia vaccines  Completed    Preventive Care for Adults  A healthy lifestyle and preventive care can promote health and wellness. Preventive health guidelines for adults include the following key practices.  . A routine yearly physical is a good way to check with your health care provider about your health and preventive screening. It is a chance to share any concerns and updates on your health and to receive a thorough exam.  . Visit your dentist for a routine exam and preventive care every 6 months. Brush your teeth twice a day and floss once a day. Good oral hygiene prevents tooth decay and gum disease.  . The frequency of eye exams is based on your age, health, family medical history, use  of contact lenses, and other factors. Follow your health care provider's ecommendations for frequency of eye exams.  . Eat a healthy diet. Foods like vegetables, fruits, whole grains, low-fat dairy products, and lean protein foods contain the nutrients you need without too many calories. Decrease your intake of foods high in solid fats, added sugars, and salt. Eat the right amount of calories for you. Get information about a proper diet from your health care provider, if necessary.  . Regular physical exercise is one of the most important things you can do for your health. Most adults should get at least 150 minutes of moderate-intensity exercise (any activity that  increases your heart rate and causes you to sweat) each week. In addition, most adults need muscle-strengthening exercises on 2 or more days a week.  Silver Sneakers may be a benefit available to you. To determine eligibility, you may visit the website: www.silversneakers.com or contact program at (520)119-7207 Mon-Fri between 8AM-8PM.   . Maintain a healthy weight. The body mass index (BMI) is a screening tool to identify possible weight problems. It provides an estimate of body fat based on height and weight. Your health care provider can find your BMI and can help you achieve or maintain a healthy weight.   For adults 20 years and older: ? A BMI below 18.5 is considered underweight. ? A BMI of 18.5 to 24.9 is normal. ? A BMI of 25 to 29.9 is considered overweight. ? A BMI of 30 and above is considered obese.   . Maintain normal blood lipids and cholesterol levels by exercising and minimizing your intake of saturated fat. Eat a balanced diet with plenty of fruit and vegetables. Blood tests for lipids and cholesterol should begin at age 94 and be repeated every 5 years. If your lipid or cholesterol levels are high, you are over 50, or you are at high risk for heart disease, you may need your cholesterol levels checked more frequently. Ongoing high lipid and cholesterol levels should be treated with medicines if diet and exercise are not working.  . If you smoke, find out from your health  care provider how to quit. If you do not use tobacco, please do not start.  . If you choose to drink alcohol, please do not consume more than 2 drinks per day. One drink is considered to be 12 ounces (355 mL) of beer, 5 ounces (148 mL) of wine, or 1.5 ounces (44 mL) of liquor.  . If you are 48-17 years old, ask your health care provider if you should take aspirin to prevent strokes.  . Use sunscreen. Apply sunscreen liberally and repeatedly throughout the day. You should seek shade when your shadow is shorter  than you. Protect yourself by wearing long sleeves, pants, a wide-brimmed hat, and sunglasses year round, whenever you are outdoors.  . Once a month, do a whole body skin exam, using a mirror to look at the skin on your back. Tell your health care provider of new moles, moles that have irregular borders, moles that are larger than a pencil eraser, or moles that have changed in shape or color.

## 2016-03-19 NOTE — Progress Notes (Signed)
   Subjective:    Patient ID: David Henry, male    DOB: Nov 30, 1926, 82 y.o.   MRN: [id number]  HPI    Review of Systems     Objective:   Physical Exam        Assessment & Plan:  I reviewed health advisor's note, was available for consultation, and agree with documentation and plan.

## 2016-04-29 ENCOUNTER — Other Ambulatory Visit: Payer: Self-pay | Admitting: Family Medicine

## 2016-05-09 NOTE — Telephone Encounter (Signed)
error 

## 2016-06-22 ENCOUNTER — Telehealth: Payer: Self-pay | Admitting: Family Medicine

## 2016-06-22 DIAGNOSIS — E119 Type 2 diabetes mellitus without complications: Secondary | ICD-10-CM

## 2016-06-22 DIAGNOSIS — I10 Essential (primary) hypertension: Secondary | ICD-10-CM

## 2016-06-22 DIAGNOSIS — D649 Anemia, unspecified: Secondary | ICD-10-CM

## 2016-06-22 DIAGNOSIS — E785 Hyperlipidemia, unspecified: Secondary | ICD-10-CM

## 2016-06-22 NOTE — Telephone Encounter (Signed)
-----   Message from Ellamae Sia sent at 06/18/2016  4:45 PM EDT ----- Regarding: Lab orders for Wednesday, 7.5.17 Lab orders for a 3 month follow up appt.

## 2016-06-25 ENCOUNTER — Other Ambulatory Visit (INDEPENDENT_AMBULATORY_CARE_PROVIDER_SITE_OTHER): Payer: Medicare Other

## 2016-06-25 DIAGNOSIS — D649 Anemia, unspecified: Secondary | ICD-10-CM

## 2016-06-25 DIAGNOSIS — E119 Type 2 diabetes mellitus without complications: Secondary | ICD-10-CM

## 2016-06-25 DIAGNOSIS — E785 Hyperlipidemia, unspecified: Secondary | ICD-10-CM

## 2016-06-25 DIAGNOSIS — I10 Essential (primary) hypertension: Secondary | ICD-10-CM

## 2016-06-25 LAB — LIPID PANEL
CHOLESTEROL: 105 mg/dL (ref 0–200)
HDL: 36.1 mg/dL — ABNORMAL LOW (ref >39.00)
LDL CALC: 51 mg/dL (ref 0–99)
NONHDL: 69.11
Total CHOL/HDL Ratio: 3
Triglycerides: 93 mg/dL (ref 0.0–149.0)
VLDL: 18.6 mg/dL (ref 0.0–40.0)

## 2016-06-25 LAB — CBC WITH DIFFERENTIAL/PLATELET
BASOS ABS: 0 10*3/uL (ref 0.0–0.1)
Basophils Relative: 0.2 % (ref 0.0–3.0)
EOS ABS: 0.3 10*3/uL (ref 0.0–0.7)
Eosinophils Relative: 4.3 % (ref 0.0–5.0)
HEMATOCRIT: 38.4 % — AB (ref 39.0–52.0)
HEMOGLOBIN: 12.6 g/dL — AB (ref 13.0–17.0)
LYMPHS PCT: 22.2 % (ref 12.0–46.0)
Lymphs Abs: 1.8 10*3/uL (ref 0.7–4.0)
MCHC: 32.7 g/dL (ref 30.0–36.0)
MCV: 99.5 fl (ref 78.0–100.0)
MONOS PCT: 8.1 % (ref 3.0–12.0)
Monocytes Absolute: 0.6 10*3/uL (ref 0.1–1.0)
Neutro Abs: 5.2 10*3/uL (ref 1.4–7.7)
Neutrophils Relative %: 65.2 % (ref 43.0–77.0)
Platelets: 206 10*3/uL (ref 150.0–400.0)
RBC: 3.86 Mil/uL — ABNORMAL LOW (ref 4.22–5.81)
RDW: 14.1 % (ref 11.5–15.5)
WBC: 8.1 10*3/uL (ref 4.0–10.5)

## 2016-06-25 LAB — COMPREHENSIVE METABOLIC PANEL
ALBUMIN: 4 g/dL (ref 3.5–5.2)
ALT: 29 U/L (ref 0–53)
AST: 24 U/L (ref 0–37)
Alkaline Phosphatase: 66 U/L (ref 39–117)
BILIRUBIN TOTAL: 0.6 mg/dL (ref 0.2–1.2)
BUN: 31 mg/dL — AB (ref 6–23)
CALCIUM: 9.4 mg/dL (ref 8.4–10.5)
CHLORIDE: 108 meq/L (ref 96–112)
CO2: 28 mEq/L (ref 19–32)
CREATININE: 1.74 mg/dL — AB (ref 0.40–1.50)
GFR: 39.56 mL/min — ABNORMAL LOW (ref >60.00)
Glucose, Bld: 114 mg/dL — ABNORMAL HIGH (ref 70–99)
Potassium: 4.3 mEq/L (ref 3.5–5.1)
SODIUM: 141 meq/L (ref 135–145)
Total Protein: 6.6 g/dL (ref 6.0–8.3)

## 2016-06-25 LAB — HEMOGLOBIN A1C: HEMOGLOBIN A1C: 6.5 % (ref 4.6–6.5)

## 2016-06-25 LAB — FERRITIN: FERRITIN: 62.6 ng/mL (ref 22.0–322.0)

## 2016-06-30 ENCOUNTER — Ambulatory Visit (INDEPENDENT_AMBULATORY_CARE_PROVIDER_SITE_OTHER): Payer: Medicare Other | Admitting: Family Medicine

## 2016-06-30 ENCOUNTER — Encounter: Payer: Self-pay | Admitting: Family Medicine

## 2016-06-30 VITALS — BP 140/60 | HR 51 | Temp 97.4°F | Ht 67.0 in | Wt 155.5 lb

## 2016-06-30 DIAGNOSIS — E785 Hyperlipidemia, unspecified: Secondary | ICD-10-CM | POA: Diagnosis not present

## 2016-06-30 DIAGNOSIS — I10 Essential (primary) hypertension: Secondary | ICD-10-CM

## 2016-06-30 DIAGNOSIS — N289 Disorder of kidney and ureter, unspecified: Secondary | ICD-10-CM | POA: Diagnosis not present

## 2016-06-30 DIAGNOSIS — K219 Gastro-esophageal reflux disease without esophagitis: Secondary | ICD-10-CM

## 2016-06-30 DIAGNOSIS — E119 Type 2 diabetes mellitus without complications: Secondary | ICD-10-CM

## 2016-06-30 NOTE — Progress Notes (Signed)
Pre visit review using our clinic review tool, if applicable. No additional management support is needed unless otherwise documented below in the visit note. 

## 2016-06-30 NOTE — Progress Notes (Signed)
Subjective:    Patient ID: David Henry, male    DOB: [birth year], 82 y.o.   MRN: [id number]  HPI Here for f/u of chronic medical problems   Wt is down 3 lb with bmi of 24.3  bp is up on first check today- lisinopril was held by renal physician and told to stay off of it/ they are monitoring bp No cp or palpitations or headaches or edema  No side effects to medicines  BP Readings from Last 3 Encounters:  06/30/16 140/60  03/18/16 120/50  03/18/16 120/50    Pulse is 51   Renal insuff   Chemistry      Component Value Date/Time   NA 141 06/25/2016 0758   K 4.3 06/25/2016 0758   CL 108 06/25/2016 0758   CO2 28 06/25/2016 0758   BUN 31* 06/25/2016 0758   CREATININE 1.74* 06/25/2016 0758      Component Value Date/Time   CALCIUM 9.4 06/25/2016 0758   ALKPHOS 66 06/25/2016 0758   AST 24 06/25/2016 0758   ALT 29 06/25/2016 0758   BILITOT 0.6 06/25/2016 0758     drinks water - with meals/ not in between  One Dr Malachi Bonds diet a day  No coffee or tea as a rule    DM2 Improved Lab Results  Component Value Date   HGBA1C 6.5 06/25/2016   this is down from 7.2 Had recommended bedtime snack- he never did it  He does not want to eat before he goes to bed  Am glucose 70s-100 in the am, wife thinks he has some night time lows   Has cut out bread entirely / does eat some crackers  Eye exam will be due next month  Hyperlipidemia Lab Results  Component Value Date   CHOL 105 06/25/2016   CHOL 107 03/12/2016   CHOL 104 09/12/2015   Lab Results  Component Value Date   HDL 36.10* 06/25/2016   HDL 40.00 03/12/2016   HDL 38.00* 09/12/2015   Lab Results  Component Value Date   LDLCALC 51 06/25/2016   LDLCALC 48 03/12/2016   LDLCALC 48 09/12/2015   Lab Results  Component Value Date   TRIG 93.0 06/25/2016   TRIG 97.0 03/12/2016   TRIG 91.0 09/12/2015   Lab Results  Component Value Date   CHOLHDL 3 06/25/2016   CHOLHDL 3 03/12/2016   CHOLHDL 3 09/12/2015   No  results found for: LDLDIRECT   For GERD- takes zantac prn -not all the time   Patient Active Problem List   Diagnosis Date Noted  . Routine general medical examination at a health care facility 03/09/2016  . Constipation 09/17/2015  . Encounter for Medicare annual wellness exam 12/08/2014  . Hematuria, microscopic 05/26/2013  . Renal insufficiency 05/24/2013  . Anemia 11/23/2012  . Abnormal TSH 11/23/2012  . Hypothyroid 11/23/2012  . GERD 11/29/2010  . CORONARY ARTERY DISEASE 04/18/2007  . Diabetes type 2, controlled ([place]) 04/15/2007  . Hyperlipidemia 04/15/2007  . Essential hypertension 04/15/2007  . MYOCARDIAL INFARCTION, HX OF 04/15/2007  . ACTINIC KERATOSIS 04/15/2007   Past Medical History  Diagnosis Date  . Diabetes mellitus     type II  . Hyperlipidemia   . Hypertension   . CAD (coronary artery disease)   . Colon polyp   . Kidney stones   . Neuropathy [facility])    Past Surgical History  Procedure Laterality Date  . Appendectomy  1984  . Cataract extraction  04/2001  . Cholecystectomy  1989  . Retinal detachment surgery  2003  . Shoulder surgery  2012    Lt shoulder repair  . Colonoscopy N/A 2/14    hemorrhoids and tics (after pos ifob card)  . Esophagogastroduodenoscopy N/A 2/14   Social History  Substance Use Topics  . Smoking status: Never Smoker   . Smokeless tobacco: None  . Alcohol Use: No   Family History  Problem Relation Age of Onset  . Cancer Brother     prostate CA  . Heart disease Mother   . Stroke Father   . Kidney disease Sister   . Kidney disease Sister   . Clotting disorder Sister   . COPD Brother   . Heart disease Brother    Allergies  Allergen Reactions  . Azithromycin     REACTION: nausea  . Celecoxib Other (See Comments)    Raises blood pressure  . Zocor [Simvastatin] Other (See Comments)    Muscle pain   Current Outpatient Prescriptions on File Prior to Visit  Medication Sig Dispense Refill  . acetaminophen (TYLENOL  ARTHRITIS PAIN) 650 MG CR tablet Take 650 mg by mouth every 8 (eight) hours as needed.      Marland Kitchen amLODipine (NORVASC) 5 MG tablet Take 1 tablet (5 mg total) by mouth daily. 90 tablet 3  . aspirin 81 MG tablet Take 81 mg by mouth daily.      Marland Kitchen glimepiride (AMARYL) 4 MG tablet TAKE ONE TABLET BY MOUTH EVERY MORNING AND TAKE ONE TABLET BY MOUTH EVERY EVENING 180 tablet 3  . glucose blood (ONE TOUCH ULTRA TEST) test strip CHECK BLOOD SUGAR TWICE DAILY AND AS DIRECTED FOR DM (DX. E11.9) 200 each 1  . guanFACINE (TENEX) 1 MG tablet Take 1 tablet (1 mg total) by mouth at bedtime. 90 tablet 3  . Insulin Glargine (LANTUS SOLOSTAR) 100 UNIT/ML Solostar Pen Inject 12 Units into the skin every morning. 15 mL 11  . Insulin Pen Needle (BD PEN NEEDLE NANO U/F) 32G X 4 MM MISC USE AS DIRECTED WITH LANTUS (DX. E11.9) 100 each 2  . JANUVIA 50 MG tablet TAKE 1 TABLET BY MOUTH ONCE A DAY 90 tablet 3  . levothyroxine (SYNTHROID, LEVOTHROID) 25 MCG tablet TAKE 1 TABLET BY MOUTH ONCE A DAY BEFOREBREAKFAST 90 tablet 3  . Multiple Vitamin (MULTIVITAMIN) capsule Take 1 capsule by mouth daily.      . NON FORMULARY 100 BC ultra fine needles    . Omega-3 Fatty Acids (FISH OIL PO) Take 2 capsules by mouth daily.      . rosuvastatin (CRESTOR) 5 MG tablet Take 1 tablet (5 mg total) by mouth every other day. 45 tablet 3  . tamsulosin (FLOMAX) 0.4 MG CAPS capsule Take 1 capsule (0.4 mg total) by mouth daily. 90 capsule 3  . [DISCONTINUED] omeprazole (PRILOSEC OTC) 20 MG tablet Take 1 tablet (20 mg total) by mouth daily. 90 tablet 1   No current facility-administered medications on file prior to visit.    Review of Systems Review of Systems  Constitutional: Negative for fever, appetite change, fatigue and unexpected weight change.  Eyes: Negative for pain and visual disturbance.  Respiratory: Negative for cough and shortness of breath.   Cardiovascular: Negative for cp or palpitations    Gastrointestinal: Negative for nausea,  diarrhea and constipation.  Genitourinary: Negative for urgency and frequency.  Endo: pos for occ hypoglycemia late at night Skin: Negative for pallor or rash   Neurological: Negative for weakness, light-headedness, numbness and headaches.  Hematological: Negative for adenopathy. Does not bruise/bleed easily.  Psychiatric/Behavioral: Negative for dysphoric mood. The patient is not nervous/anxious.         Objective:   Physical Exam  Constitutional: He appears well-developed and well-nourished. No distress.  Well appearing elderly male  HENT:  Head: Normocephalic and atraumatic.  Mouth/Throat: Oropharynx is clear and moist.  Eyes: Conjunctivae and EOM are normal. Pupils are equal, round, and reactive to light.  Neck: Normal range of motion. Neck supple. No JVD present. Carotid bruit is not present. No thyromegaly present.  Cardiovascular: Normal rate, regular rhythm, normal heart sounds and intact distal pulses.  Exam reveals no gallop.   Pulmonary/Chest: Effort normal and breath sounds normal. No respiratory distress. He has no wheezes. He has no rales.  No crackles  Abdominal: Soft. Bowel sounds are normal. He exhibits no distension, no abdominal bruit and no mass. There is no tenderness.  Musculoskeletal: He exhibits no edema.  Lymphadenopathy:    He has no cervical adenopathy.  Neurological: He is alert. He has normal reflexes.  Skin: Skin is warm and dry. No rash noted. No pallor.  Psychiatric: He has a normal mood and affect.          Assessment & Plan:   Problem List Items Addressed This Visit      Cardiovascular and Mediastinum   Essential hypertension - Primary    bp is up mildly without ace (stopped by renal)  Will continue to follow Continue amlodipine and tenex  Disc DASH eating plan F/u with renal as planned        Digestive   GERD    Uses zantac prn        Endocrine   Diabetes type 2, controlled ([place])    Improved control Lab Results  Component  Value Date   HGBA1C 6.5 06/25/2016   Did again recommend pm snack with protein to avoid low glucose in the middle of the night Commended good habits  F/u planned  Will get eye exam next month        Genitourinary   Renal insufficiency    Now off lisinopril Lab Results  Component Value Date   CREATININE 1.74* 06/25/2016   mild improvement Again reminded to get a better water intake  F/u renal as planned         Other   Hyperlipidemia    Disc goals for lipids and reasons to control them Rev labs with pt Rev low sat fat diet in detail  Overall stable and fair control  Continue crestor

## 2016-06-30 NOTE — Assessment & Plan Note (Signed)
Now off lisinopril Lab Results  Component Value Date   CREATININE 1.74* 06/25/2016   mild improvement Again reminded to get a better water intake  F/u renal as planned

## 2016-06-30 NOTE — Assessment & Plan Note (Signed)
Uses zantac prn

## 2016-06-30 NOTE — Assessment & Plan Note (Signed)
Improved control Lab Results  Component Value Date   HGBA1C 6.5 06/25/2016   Did again recommend pm snack with protein to avoid low glucose in the middle of the night Commended good habits  F/u planned  Will get eye exam next month

## 2016-06-30 NOTE — Assessment & Plan Note (Signed)
Disc goals for lipids and reasons to control them Rev labs with pt Rev low sat fat diet in detail  Overall stable and fair control  Continue crestor

## 2016-06-30 NOTE — Assessment & Plan Note (Signed)
bp is up mildly without ace (stopped by renal)  Will continue to follow Continue amlodipine and tenex  Disc DASH eating plan F/u with renal as planned

## 2016-06-30 NOTE — Patient Instructions (Addendum)
Eat a very small bedtime snack - to keep glucose stable at night  You can try for example a teaspoon of peanut butter or a small serving of Glucerna  Drink more water in between meals  No change in medicines  See the kidney doctor as planned  Follow up after dec 18th for annual exam

## 2016-07-11 ENCOUNTER — Other Ambulatory Visit: Payer: Self-pay | Admitting: Family Medicine

## 2016-09-12 ENCOUNTER — Ambulatory Visit (INDEPENDENT_AMBULATORY_CARE_PROVIDER_SITE_OTHER): Payer: Medicare Other

## 2016-09-12 DIAGNOSIS — Z23 Encounter for immunization: Secondary | ICD-10-CM | POA: Diagnosis not present

## 2016-10-06 ENCOUNTER — Other Ambulatory Visit: Payer: Self-pay | Admitting: Family Medicine

## 2016-11-05 ENCOUNTER — Other Ambulatory Visit: Payer: Self-pay | Admitting: Family Medicine

## 2016-11-14 ENCOUNTER — Other Ambulatory Visit: Payer: Self-pay | Admitting: Family Medicine

## 2016-12-03 ENCOUNTER — Other Ambulatory Visit: Payer: Self-pay | Admitting: Family Medicine

## 2016-12-04 LAB — HM DIABETES EYE EXAM

## 2016-12-07 ENCOUNTER — Telehealth: Payer: Self-pay | Admitting: Family Medicine

## 2016-12-07 DIAGNOSIS — I10 Essential (primary) hypertension: Secondary | ICD-10-CM

## 2016-12-07 DIAGNOSIS — E039 Hypothyroidism, unspecified: Secondary | ICD-10-CM

## 2016-12-07 DIAGNOSIS — E7800 Pure hypercholesterolemia, unspecified: Secondary | ICD-10-CM

## 2016-12-07 DIAGNOSIS — E119 Type 2 diabetes mellitus without complications: Secondary | ICD-10-CM

## 2016-12-07 NOTE — Telephone Encounter (Signed)
-----   Message from Marchia Bond sent at 12/03/2016  3:49 PM EST ----- Regarding: Dm f/u labs Fri 12/22, need orders. Thanks! :-) Please order future dm f/u labs for pt's upcoming lab appt. Thanks Aniceto Boss

## 2016-12-12 ENCOUNTER — Ambulatory Visit: Payer: Medicare Other

## 2016-12-12 ENCOUNTER — Other Ambulatory Visit (INDEPENDENT_AMBULATORY_CARE_PROVIDER_SITE_OTHER): Payer: Medicare Other

## 2016-12-12 DIAGNOSIS — E039 Hypothyroidism, unspecified: Secondary | ICD-10-CM

## 2016-12-12 DIAGNOSIS — I10 Essential (primary) hypertension: Secondary | ICD-10-CM

## 2016-12-12 DIAGNOSIS — E119 Type 2 diabetes mellitus without complications: Secondary | ICD-10-CM

## 2016-12-12 DIAGNOSIS — E7800 Pure hypercholesterolemia, unspecified: Secondary | ICD-10-CM

## 2016-12-12 LAB — LIPID PANEL
CHOLESTEROL: 108 mg/dL (ref 0–200)
HDL: 39.9 mg/dL (ref >39.00)
LDL CALC: 50 mg/dL (ref 0–99)
NonHDL: 68.54
TRIGLYCERIDES: 92 mg/dL (ref 0.0–149.0)
Total CHOL/HDL Ratio: 3
VLDL: 18.4 mg/dL (ref 0.0–40.0)

## 2016-12-12 LAB — COMPREHENSIVE METABOLIC PANEL
ALT: 33 U/L (ref 0–53)
AST: 24 U/L (ref 0–37)
Albumin: 4.1 g/dL (ref 3.5–5.2)
Alkaline Phosphatase: 72 U/L (ref 39–117)
BUN: 28 mg/dL — ABNORMAL HIGH (ref 6–23)
CALCIUM: 9.4 mg/dL (ref 8.4–10.5)
CHLORIDE: 109 meq/L (ref 96–112)
CO2: 30 meq/L (ref 19–32)
Creatinine, Ser: 1.69 mg/dL — ABNORMAL HIGH (ref 0.40–1.50)
GFR: 40.87 mL/min — AB (ref >60.00)
GLUCOSE: 93 mg/dL (ref 70–99)
Potassium: 4.3 mEq/L (ref 3.5–5.1)
Sodium: 144 mEq/L (ref 135–145)
Total Bilirubin: 0.7 mg/dL (ref 0.2–1.2)
Total Protein: 6.8 g/dL (ref 6.0–8.3)

## 2016-12-12 LAB — TSH: TSH: 3.83 u[IU]/mL (ref 0.35–4.50)

## 2016-12-12 LAB — HEMOGLOBIN A1C: Hgb A1c MFr Bld: 7 % — ABNORMAL HIGH (ref 4.6–6.5)

## 2016-12-19 ENCOUNTER — Ambulatory Visit (INDEPENDENT_AMBULATORY_CARE_PROVIDER_SITE_OTHER): Payer: Medicare Other | Admitting: Family Medicine

## 2016-12-19 ENCOUNTER — Encounter: Payer: Self-pay | Admitting: Family Medicine

## 2016-12-19 VITALS — BP 136/72 | HR 60 | Temp 97.4°F | Ht 67.0 in | Wt 160.0 lb

## 2016-12-19 DIAGNOSIS — E1122 Type 2 diabetes mellitus with diabetic chronic kidney disease: Secondary | ICD-10-CM | POA: Diagnosis not present

## 2016-12-19 DIAGNOSIS — N289 Disorder of kidney and ureter, unspecified: Secondary | ICD-10-CM | POA: Diagnosis not present

## 2016-12-19 DIAGNOSIS — E7800 Pure hypercholesterolemia, unspecified: Secondary | ICD-10-CM

## 2016-12-19 DIAGNOSIS — I10 Essential (primary) hypertension: Secondary | ICD-10-CM | POA: Diagnosis not present

## 2016-12-19 DIAGNOSIS — N183 Chronic kidney disease, stage 3 (moderate): Secondary | ICD-10-CM

## 2016-12-19 DIAGNOSIS — Z794 Long term (current) use of insulin: Secondary | ICD-10-CM

## 2016-12-19 DIAGNOSIS — E039 Hypothyroidism, unspecified: Secondary | ICD-10-CM

## 2016-12-19 NOTE — Progress Notes (Signed)
Subjective:    Patient ID: David Henry, male    DOB: [birth year], 82 y.o.   MRN: [id number]  HPI Here for routine f/u of chronic medical problems   Feeling good overall    Wt Readings from Last 3 Encounters:  12/19/16 160 lb (72.6 kg)  06/30/16 155 lb 8 oz (70.5 kg)  03/18/16 158 lb 8 oz (71.9 kg)  eating some holiday food  bmi is 25.0  bp is stable today  No cp or palpitations or headaches or edema  No side effects to medicines  BP Readings from Last 3 Encounters:  12/19/16 136/72  06/30/16 140/60  03/18/16 (!) 120/50      Diabetes Home sugar results - last visit was having hypoglycemia at night  No more low glucose readings at night  Some snacks at night  In the am glucose is 85-130 , does not check later  DM diet - cheated during the holidays/ getting back on track  Exercise -not much when it is cold  Symptoms A1C last  Lab Results  Component Value Date   HGBA1C 7.0 (H) 12/12/2016  up from 6.5   No problems with medications -januvia, lantus, amaryl Renal protection-sees renal /is off ace/arb Last eye exam  12/17- just had it /no retinopathy   Sees renal for renal insuf Lab Results  Component Value Date   CREATININE 1.69 (H) 12/12/2016   BUN 28 (H) 12/12/2016   NA 144 12/12/2016   K 4.3 12/12/2016   CL 109 12/12/2016   CO2 30 12/12/2016   This is improved    Hx of hyperlipidemia Lab Results  Component Value Date   CHOL 108 12/12/2016   HDL 39.90 12/12/2016   LDLCALC 50 12/12/2016   TRIG 92.0 12/12/2016   CHOLHDL 3 12/12/2016   crestor and diet   Hypothyroidism  Pt has no clinical changes No change in energy level/ hair or skin/ edema and no tremor Lab Results  Component Value Date   TSH 3.83 12/12/2016     Reviewed health habits including diet and exercise and skin cancer prevention Reviewed appropriate screening tests for age  Also reviewed health mt list, fam hx and immunization status , as well as social and family history      Patient Active Problem List   Diagnosis Date Noted  . Routine general medical examination at a health care facility 03/09/2016  . Constipation 09/17/2015  . Encounter for Medicare annual wellness exam 12/08/2014  . Hematuria, microscopic 05/26/2013  . Renal insufficiency 05/24/2013  . Anemia 11/23/2012  . Hypothyroid 11/23/2012  . GERD 11/29/2010  . CORONARY ARTERY DISEASE 04/18/2007  . Controlled type 2 diabetes mellitus with chronic kidney disease ([place]) 04/15/2007  . Hyperlipidemia 04/15/2007  . Essential hypertension 04/15/2007  . MYOCARDIAL INFARCTION, HX OF 04/15/2007  . ACTINIC KERATOSIS 04/15/2007   Past Medical History:  Diagnosis Date  . CAD (coronary artery disease)   . Colon polyp   . Diabetes mellitus    type II  . Hyperlipidemia   . Hypertension   . Kidney stones   . Neuropathy [facility])    Past Surgical History:  Procedure Laterality Date  . APPENDECTOMY  1984  . CATARACT EXTRACTION  04/2001  . CHOLECYSTECTOMY  1989  . COLONOSCOPY N/A 2/14   hemorrhoids and tics (after pos ifob card)  . ESOPHAGOGASTRODUODENOSCOPY N/A 2/14  . RETINAL DETACHMENT SURGERY  2003  . SHOULDER SURGERY  2012   Lt shoulder repair   Social History  Substance Use Topics  . Smoking status: Never Smoker  . Smokeless tobacco: Not on file  . Alcohol use No   Family History  Problem Relation Age of Onset  . Cancer Brother     prostate CA  . Heart disease Mother   . Stroke Father   . Kidney disease Sister   . Kidney disease Sister   . Clotting disorder Sister   . COPD Brother   . Heart disease Brother    Allergies  Allergen Reactions  . Azithromycin     REACTION: nausea  . Celecoxib Other (See Comments)    Raises blood pressure  . Zocor [Simvastatin] Other (See Comments)    Muscle pain   Current Outpatient Prescriptions on File Prior to Visit  Medication Sig Dispense Refill  . acetaminophen (TYLENOL ARTHRITIS PAIN) 650 MG CR tablet Take 650 mg by mouth every 8  (eight) hours as needed.      Marland Kitchen aspirin 81 MG tablet Take 81 mg by mouth daily.      . BD PEN NEEDLE NANO U/F 32G X 4 MM MISC USE AS DIRECTED WITH LANTUS 100 each 2  . glimepiride (AMARYL) 4 MG tablet TAKE 1 TABLET BY MOUTH EVERY MORNING ANDTAKE 1 TABLET EVERY EVENING 180 tablet 0  . glucose blood (ONE TOUCH ULTRA TEST) test strip CHECK BLOOD SUGAR TWICE DAILY AND AS DIRECTED FOR DM (DX. E11.9) 200 each 1  . guanFACINE (TENEX) 1 MG tablet TAKE 1 TABLET BY MOUTH DAILY AT BEDTIME 90 tablet 1  . JANUVIA 50 MG tablet TAKE 1 TABLET BY MOUTH ONCE A DAY 90 tablet 3  . LANTUS SOLOSTAR 100 UNIT/ML Solostar Pen INJECT 12 UNITS INTO THE SKIN EVERY MORNING 15 mL 5  . levothyroxine (SYNTHROID, LEVOTHROID) 25 MCG tablet TAKE 1 TABLET BY MOUTH ONCE A DAY BEFOREBREAKFAST 90 tablet 0  . Multiple Vitamin (MULTIVITAMIN) capsule Take 1 capsule by mouth daily.      . NON FORMULARY 100 BC ultra fine needles    . Omega-3 Fatty Acids (FISH OIL PO) Take 2 capsules by mouth daily.      . rosuvastatin (CRESTOR) 5 MG tablet Take 1 tablet (5 mg total) by mouth every other day. 45 tablet 3  . tamsulosin (FLOMAX) 0.4 MG CAPS capsule TAKE 1 CAPSULE BY MOUTH ONCE DAILY 90 capsule 1  . [DISCONTINUED] omeprazole (PRILOSEC OTC) 20 MG tablet Take 1 tablet (20 mg total) by mouth daily. 90 tablet 1   No current facility-administered medications on file prior to visit.     Review of Systems Review of Systems  Constitutional: Negative for fever, appetite change, fatigue and unexpected weight change.  Eyes: Negative for pain and visual disturbance.  Respiratory: Negative for cough and shortness of breath.   Cardiovascular: Negative for cp or palpitations    Gastrointestinal: Negative for nausea, diarrhea and constipation.  Genitourinary: Negative for urgency and frequency.  Skin: Negative for pallor or rash   MSK pos for occ L great toe pain w/o swelling or redness  Neurological: Negative for weakness, light-headedness, numbness  and headaches. neg for neuropathy symptoms  Hematological: Negative for adenopathy. Does not bruise/bleed easily.  Psychiatric/Behavioral: Negative for dysphoric mood. The patient is not nervous/anxious.         Objective:   Physical Exam  Constitutional: He appears well-developed and well-nourished. No distress.  Well appearing   HENT:  Head: Normocephalic and atraumatic.  Mouth/Throat: Oropharynx is clear and moist.  Eyes: Conjunctivae and EOM are normal.  Pupils are equal, round, and reactive to light.  Neck: Normal range of motion. Neck supple. No JVD present. Carotid bruit is not present. No thyromegaly present.  Cardiovascular: Normal rate, regular rhythm, normal heart sounds and intact distal pulses.  Exam reveals no gallop.   Pulmonary/Chest: Effort normal and breath sounds normal. No respiratory distress. He has no wheezes. He has no rales.  No crackles  Abdominal: Soft. Bowel sounds are normal. He exhibits no distension, no abdominal bruit and no mass. There is no tenderness.  Musculoskeletal: He exhibits no edema.  Lymphadenopathy:    He has no cervical adenopathy.  Neurological: He is alert. He has normal reflexes.  Skin: Skin is warm and dry. No rash noted. No pallor.  Psychiatric: He has a normal mood and affect.          Assessment & Plan:

## 2016-12-19 NOTE — Assessment & Plan Note (Signed)
Cr is 1.69 today  Enc good fluid intake  Also good bp and glucose control Continue renal f/u appts Cannot take ace or arb

## 2016-12-19 NOTE — Assessment & Plan Note (Signed)
Lab Results  Component Value Date   HGBA1C 7.0 (H) 12/12/2016   This is up from 6.5  Holiday eating and less exercise/ wt gain noted Also bedtime snack to prev hypoglycemia (doing better) No change in therapy  opth utd Sees renal  Nl foot exam  F/u march as planned

## 2016-12-19 NOTE — Assessment & Plan Note (Signed)
bp in fair control at this time  BP Readings from Last 1 Encounters:  12/19/16 136/72   No changes needed Disc lifstyle change with low sodium diet and exercise  Labs reviewed

## 2016-12-19 NOTE — Assessment & Plan Note (Signed)
Disc goals for lipids and reasons to control them Rev labs with pt Rev low sat fat diet in detail Well controlled with crestor and diet  Hx of vasc dz

## 2016-12-19 NOTE — Progress Notes (Signed)
Pre visit review using our clinic review tool, if applicable. No additional management support is needed unless otherwise documented below in the visit note. 

## 2016-12-19 NOTE — Patient Instructions (Addendum)
Some days instead of am - check glucose 2 hours after supper  Goal for blood glucose 2 hours after a meal would be 140s  Get on your exer cycle - start with 15 minutes a day if you can  (wear warm clothes if necessary)  Do whatever you can to exercise indoors until it warms up   Try to stick to a diabetic diet  (get away from holiday eating)  Have a small snack with protein at night to prevent low blood sugar in the middle of the night   Cholesterol is well controlled  Thyroid is stable

## 2016-12-19 NOTE — Assessment & Plan Note (Signed)
Hypothyroidism  Pt has no clinical changes No change in energy level/ hair or skin/ edema and no tremor Lab Results  Component Value Date   TSH 3.83 12/12/2016

## 2016-12-31 ENCOUNTER — Other Ambulatory Visit: Payer: Self-pay | Admitting: Family Medicine

## 2017-01-05 ENCOUNTER — Other Ambulatory Visit: Payer: Self-pay | Admitting: Family Medicine

## 2017-01-24 ENCOUNTER — Other Ambulatory Visit: Payer: Self-pay | Admitting: Family Medicine

## 2017-03-05 ENCOUNTER — Other Ambulatory Visit: Payer: Self-pay | Admitting: Family Medicine

## 2017-03-13 ENCOUNTER — Other Ambulatory Visit: Payer: Medicare Other

## 2017-03-13 ENCOUNTER — Ambulatory Visit: Payer: Medicare Other

## 2017-03-15 ENCOUNTER — Telehealth: Payer: Self-pay | Admitting: Family Medicine

## 2017-03-15 DIAGNOSIS — N183 Chronic kidney disease, stage 3 unspecified: Secondary | ICD-10-CM

## 2017-03-15 DIAGNOSIS — Z794 Long term (current) use of insulin: Principal | ICD-10-CM

## 2017-03-15 DIAGNOSIS — Z0000 Encounter for general adult medical examination without abnormal findings: Secondary | ICD-10-CM

## 2017-03-15 DIAGNOSIS — E1122 Type 2 diabetes mellitus with diabetic chronic kidney disease: Secondary | ICD-10-CM

## 2017-03-15 NOTE — Telephone Encounter (Signed)
-----   Message from Eustace Pen, LPN sent at 1/65/8006 12:10 AM EDT ----- Regarding: Labs 3/29 Please place lab orders. If none, please advise.

## 2017-03-19 ENCOUNTER — Ambulatory Visit (INDEPENDENT_AMBULATORY_CARE_PROVIDER_SITE_OTHER): Payer: Medicare Other

## 2017-03-19 ENCOUNTER — Encounter (INDEPENDENT_AMBULATORY_CARE_PROVIDER_SITE_OTHER): Payer: Self-pay

## 2017-03-19 VITALS — BP 144/78 | HR 56 | Temp 97.8°F | Ht 67.0 in | Wt 160.0 lb

## 2017-03-19 DIAGNOSIS — Z0000 Encounter for general adult medical examination without abnormal findings: Secondary | ICD-10-CM

## 2017-03-19 DIAGNOSIS — N183 Chronic kidney disease, stage 3 unspecified: Secondary | ICD-10-CM

## 2017-03-19 DIAGNOSIS — E1122 Type 2 diabetes mellitus with diabetic chronic kidney disease: Secondary | ICD-10-CM

## 2017-03-19 DIAGNOSIS — Z794 Long term (current) use of insulin: Secondary | ICD-10-CM | POA: Diagnosis not present

## 2017-03-19 LAB — CBC WITH DIFFERENTIAL/PLATELET
BASOS PCT: 0.5 % (ref 0.0–3.0)
Basophils Absolute: 0 10*3/uL (ref 0.0–0.1)
EOS PCT: 2.8 % (ref 0.0–5.0)
Eosinophils Absolute: 0.2 10*3/uL (ref 0.0–0.7)
HCT: 39.9 % (ref 39.0–52.0)
HEMOGLOBIN: 13.4 g/dL (ref 13.0–17.0)
LYMPHS PCT: 22.2 % (ref 12.0–46.0)
Lymphs Abs: 1.4 10*3/uL (ref 0.7–4.0)
MCHC: 33.5 g/dL (ref 30.0–36.0)
MCV: 100.1 fl — ABNORMAL HIGH (ref 78.0–100.0)
MONO ABS: 0.6 10*3/uL (ref 0.1–1.0)
Monocytes Relative: 9.2 % (ref 3.0–12.0)
NEUTROS ABS: 4.3 10*3/uL (ref 1.4–7.7)
Neutrophils Relative %: 65.3 % (ref 43.0–77.0)
PLATELETS: 219 10*3/uL (ref 150.0–400.0)
RBC: 3.98 Mil/uL — ABNORMAL LOW (ref 4.22–5.81)
RDW: 13.4 % (ref 11.5–15.5)
WBC: 6.5 10*3/uL (ref 4.0–10.5)

## 2017-03-19 LAB — COMPREHENSIVE METABOLIC PANEL
ALBUMIN: 4.3 g/dL (ref 3.5–5.2)
ALT: 29 U/L (ref 0–53)
AST: 25 U/L (ref 0–37)
Alkaline Phosphatase: 72 U/L (ref 39–117)
BUN: 24 mg/dL — ABNORMAL HIGH (ref 6–23)
CALCIUM: 9.8 mg/dL (ref 8.4–10.5)
CHLORIDE: 109 meq/L (ref 96–112)
CO2: 29 meq/L (ref 19–32)
Creatinine, Ser: 1.49 mg/dL (ref 0.40–1.50)
GFR: 47.23 mL/min — ABNORMAL LOW (ref >60.00)
Glucose, Bld: 86 mg/dL (ref 70–99)
POTASSIUM: 4.7 meq/L (ref 3.5–5.1)
Sodium: 142 mEq/L (ref 135–145)
Total Bilirubin: 1.1 mg/dL (ref 0.2–1.2)
Total Protein: 7 g/dL (ref 6.0–8.3)

## 2017-03-19 LAB — LIPID PANEL
CHOLESTEROL: 108 mg/dL (ref 0–200)
HDL: 46.7 mg/dL (ref >39.00)
LDL CALC: 50 mg/dL (ref 0–99)
NonHDL: 61.65
TRIGLYCERIDES: 59 mg/dL (ref 0.0–149.0)
Total CHOL/HDL Ratio: 2
VLDL: 11.8 mg/dL (ref 0.0–40.0)

## 2017-03-19 LAB — TSH: TSH: 3.18 u[IU]/mL (ref 0.35–4.50)

## 2017-03-19 LAB — HEMOGLOBIN A1C: HEMOGLOBIN A1C: 7.3 % — AB (ref 4.6–6.5)

## 2017-03-19 NOTE — Progress Notes (Signed)
Subjective:   David Henry is a 83 y.o. male who presents for Medicare Annual/Subsequent preventive examination.  Review of Systems:  N/A Cardiac Risk Factors include: advanced age (>48men, >31 women);male gender;diabetes mellitus;dyslipidemia;hypertension;sedentary lifestyle     Objective:    Vitals: BP (!) 144/78 (BP Location: Right Arm, Patient Position: Sitting, Cuff Size: Normal)   Pulse (!) 56   Temp 97.8 F (36.6 C) (Oral)   Ht 5\' 7"  (1.702 m) Comment: no shoes  Wt 160 lb (72.6 kg)   SpO2 99%   BMI 25.06 kg/m   Body mass index is 25.06 kg/m.  Tobacco History  Smoking Status  . Never Smoker  Smokeless Tobacco  . Never Used     Counseling given: No   Past Medical History:  Diagnosis Date  . CAD (coronary artery disease)   . Colon polyp   . Diabetes mellitus    type II  . Hyperlipidemia   . Hypertension   . Kidney stones   . Neuropathy [facility]    Past Surgical History:  Procedure Laterality Date  . APPENDECTOMY  1984  . CATARACT EXTRACTION  04/2001  . CHOLECYSTECTOMY  1989  . COLONOSCOPY N/A 2/14   hemorrhoids and tics (after pos ifob card)  . ESOPHAGOGASTRODUODENOSCOPY N/A 2/14  . RETINAL DETACHMENT SURGERY  2003  . SHOULDER SURGERY  2012   Lt shoulder repair   Family History  Problem Relation Age of Onset  . Cancer Brother     prostate CA  . Heart disease Mother   . Stroke Father   . Kidney disease Sister   . Kidney disease Sister   . Clotting disorder Sister   . COPD Brother   . Heart disease Brother    History  Sexual Activity  . Sexual activity: No    Outpatient Encounter Prescriptions as of 03/19/2017  Medication Sig  . acetaminophen (TYLENOL ARTHRITIS PAIN) 650 MG CR tablet Take 650 mg by mouth every 8 (eight) hours as needed.    Marland Kitchen amLODipine (NORVASC) 5 MG tablet Take 5 mg by mouth daily.  Marland Kitchen aspirin 81 MG tablet Take 81 mg by mouth daily.    . BD PEN NEEDLE NANO U/F 32G X 4 MM MISC USE AS DIRECTED WITH LANTUS  . glimepiride  (AMARYL) 4 MG tablet TAKE 1 TABLET BY MOUTH EVERY MORNING ANDTAKE 1 TABLET EVERY EVENING  . glucose blood (ONE TOUCH ULTRA TEST) test strip CHECK BLOOD SUGAR TWICE DAILY AND AS DIRECTED FOR DM (DX. E11.9)  . guanFACINE (TENEX) 1 MG tablet TAKE 1 TABLET BY MOUTH DAILY AT BEDTIME  . JANUVIA 50 MG tablet TAKE 1 TABLET BY MOUTH ONCE A DAY  . LANTUS SOLOSTAR 100 UNIT/ML Solostar Pen INJECT 12 UNITS INTO THE SKIN EVERY MORNING  . levothyroxine (SYNTHROID, LEVOTHROID) 25 MCG tablet TAKE 1 TABLET BY MOUTH ONCE A DAY BEFOREBREAKFAST  . Multiple Vitamin (MULTIVITAMIN) capsule Take 1 capsule by mouth daily.    . NON FORMULARY 100 BC ultra fine needles  . Omega-3 Fatty Acids (FISH OIL PO) Take 2 capsules by mouth daily.    . rosuvastatin (CRESTOR) 5 MG tablet Take 1 tablet (5 mg total) by mouth every other day.  . tamsulosin (FLOMAX) 0.4 MG CAPS capsule TAKE 1 CAPSULE BY MOUTH ONCE DAILY   No facility-administered encounter medications on file as of 03/19/2017.     Activities of Daily Living In your present state of health, do you have any difficulty performing the following activities: 03/19/2017  Hearing? N  Vision? N  Difficulty concentrating or making decisions? N  Walking or climbing stairs? N  Dressing or bathing? N  Doing errands, shopping? N  Preparing Food and eating ? N  Using the Toilet? N  In the past six months, have you accidently leaked urine? N  Do you have problems with loss of bowel control? N  Managing your Medications? N  Managing your Finances? N  Housekeeping or managing your Housekeeping? N  Some recent data might be hidden    Patient Care Team: Abner Greenspan, MD as PCP - General Leandrew Koyanagi, MD as Consulting Physician (Ophthalmology) Corey Skains, MD as Consulting Physician (Cardiology) Murlean Iba, MD as Consulting Physician (Internal Medicine)   Assessment:     Hearing Screening   125Hz  250Hz  500Hz  1000Hz  2000Hz  3000Hz  4000Hz  6000Hz  8000Hz   Right  ear:   0 0 40  0    Left ear:   0 0 40  0    Vision Screening Comments: Last vision exam in Dec 2017 with Dr. Wallace Going   Exercise Activities and Dietary recommendations Current Exercise Habits: The patient does not participate in regular exercise at present (pt does yard work for 60 min in summer season), Exercise limited by: None identified  Goals    . Increase physical activity          When weather permits, I resume yard work for at least 60 min once weekly.       Fall Risk Fall Risk  03/19/2017 03/18/2016 03/18/2016 12/08/2014 11/23/2012  Falls in the past year? No No No No No   Depression Screen PHQ 2/9 Scores 03/19/2017 03/18/2016 03/18/2016 12/08/2014  PHQ - 2 Score 0 0 0 0    Cognitive Function MMSE - Mini Mental State Exam 03/19/2017 03/18/2016  Orientation to time 5 5  Orientation to Place 5 5  Registration 3 3  Attention/ Calculation 0 0  Recall 2 3  Recall-comments pt was unable to recall 1 of 3 words -  Language- name 2 objects 0 0  Language- repeat 1 1  Language- follow 3 step command 3 3  Language- read & follow direction 0 0  Write a sentence 0 0  Copy design 0 0  Total score 19 20     PLEASE NOTE: A Mini-Cog screen was completed. Maximum score is 20. A value of 0 denotes this part of [place]stein MMSE was not completed or the patient failed this part of the Mini-Cog screening.   Mini-Cog Screening Orientation to Time - Max 5 pts Orientation to Place - Max 5 pts Registration - Max 3 pts Recall - Max 3 pts Language Repeat - Max 1 pts Language Follow 3 Step Command - Max 3 pts   Immunization History  Administered Date(s) Administered  . H1N1 12/28/2008  . Influenza Split 10/23/2011, 09/15/2012  . Influenza Whole 09/22/1999, 10/01/2007, 10/02/2008, 09/27/2009, 10/09/2010  . Influenza,inj,Quad PF,36+ Mos 09/13/2014, 09/17/2015, 09/12/2016  . Influenza-Unspecified 09/20/2013  . Pneumococcal Conjugate-13 12/08/2014  . Pneumococcal Polysaccharide-23  07/23/2003  . Td 12/04/1999, 03/01/2008  . Tdap 05/20/2011   Screening Tests Health Maintenance  Topic Date Due  . HEMOGLOBIN A1C  06/12/2017  . OPHTHALMOLOGY EXAM  12/04/2017  . FOOT EXAM  12/19/2017  . TETANUS/TDAP  05/19/2021  . INFLUENZA VACCINE  Addressed  . PNA vac Low Risk Adult  Completed      Plan:     I have personally reviewed and addressed the Medicare Annual Wellness questionnaire and  have noted the following in the patient's chart:  A. Medical and social history B. Use of alcohol, tobacco or illicit drugs  C. Current medications and supplements D. Functional ability and status E.  Nutritional status F.  Physical activity G. Advance directives H. List of other physicians I.  Hospitalizations, surgeries, and ER visits in previous 12 months J.  [place] to include hearing, vision, cognitive, depression L. Referrals and appointments - none  In addition, I have reviewed and discussed with patient certain preventive protocols, quality metrics, and best practice recommendations. A written personalized care plan for preventive services as well as general preventive health recommendations were provided to patient.  See attached scanned questionnaire for additional information.   Signed,   Lindell Noe, MHA, BS, LPN Health Coach

## 2017-03-19 NOTE — Progress Notes (Signed)
Pre visit review using our clinic review tool, if applicable. No additional management support is needed unless otherwise documented below in the visit note. 

## 2017-03-19 NOTE — Patient Instructions (Addendum)
David Henry , Thank you for taking time to come for your Medicare Wellness Visit. I appreciate your ongoing commitment to your health goals. Please review the following plan we discussed and let me know if I can assist you in the future.   These are the goals we discussed: Goals    . Increase physical activity          When weather permits, I resume yard work for at least 60 min once weekly.        This is a list of the screening recommended for you and due dates:  Health Maintenance  Topic Date Due  . Hemoglobin A1C  06/12/2017  . Eye exam for diabetics  12/04/2017  . Complete foot exam   12/19/2017  . Tetanus Vaccine  05/19/2021  . Flu Shot  Addressed  . Pneumonia vaccines  Completed   Preventive Care for Adults  A healthy lifestyle and preventive care can promote health and wellness. Preventive health guidelines for adults include the following key practices.  . A routine yearly physical is a good way to check with your health care provider about your health and preventive screening. It is a chance to share any concerns and updates on your health and to receive a thorough exam.  . Visit your dentist for a routine exam and preventive care every 6 months. Brush your teeth twice a day and floss once a day. Good oral hygiene prevents tooth decay and gum disease.  . The frequency of eye exams is based on your age, health, family medical history, use  of contact lenses, and other factors. Follow your health care provider's ecommendations for frequency of eye exams.  . Eat a healthy diet. Foods like vegetables, fruits, whole grains, low-fat dairy products, and lean protein foods contain the nutrients you need without too many calories. Decrease your intake of foods high in solid fats, added sugars, and salt. Eat the right amount of calories for you. Get information about a proper diet from your health care provider, if necessary.  . Regular physical exercise is one of the most  important things you can do for your health. Most adults should get at least 150 minutes of moderate-intensity exercise (any activity that increases your heart rate and causes you to sweat) each week. In addition, most adults need muscle-strengthening exercises on 2 or more days a week.  Silver Sneakers may be a benefit available to you. To determine eligibility, you may visit the website: www.silversneakers.com or contact program at (407)089-8289 Mon-Fri between 8AM-8PM.   . Maintain a healthy weight. The body mass index (BMI) is a screening tool to identify possible weight problems. It provides an estimate of body fat based on height and weight. Your health care provider can find your BMI and can help you achieve or maintain a healthy weight.   For adults 20 years and older: ? A BMI below 18.5 is considered underweight. ? A BMI of 18.5 to 24.9 is normal. ? A BMI of 25 to 29.9 is considered overweight. ? A BMI of 30 and above is considered obese.   . Maintain normal blood lipids and cholesterol levels by exercising and minimizing your intake of saturated fat. Eat a balanced diet with plenty of fruit and vegetables. Blood tests for lipids and cholesterol should begin at age 30 and be repeated every 5 years. If your lipid or cholesterol levels are high, you are over 50, or you are at high risk for heart disease, you  may need your cholesterol levels checked more frequently. Ongoing high lipid and cholesterol levels should be treated with medicines if diet and exercise are not working.  . If you smoke, find out from your health care provider how to quit. If you do not use tobacco, please do not start.  . If you choose to drink alcohol, please do not consume more than 2 drinks per day. One drink is considered to be 12 ounces (355 mL) of beer, 5 ounces (148 mL) of wine, or 1.5 ounces (44 mL) of liquor.  . If you are 53-36 years old, ask your health care provider if you should take aspirin to prevent  strokes.  . Use sunscreen. Apply sunscreen liberally and repeatedly throughout the day. You should seek shade when your shadow is shorter than you. Protect yourself by wearing long sleeves, pants, a wide-brimmed hat, and sunglasses year round, whenever you are outdoors.  . Once a month, do a whole body skin exam, using a mirror to look at the skin on your back. Tell your health care provider of new moles, moles that have irregular borders, moles that are larger than a pencil eraser, or moles that have changed in shape or color.

## 2017-03-19 NOTE — Progress Notes (Signed)
PCP notes:   Health maintenance:  No gaps identified.  Abnormal screenings:   Mini-Cog score: 19 Hearing - failed  Patient concerns:   None  Nurse concerns:  None  Next PCP appt:   03/30/17 @ 0930

## 2017-03-20 ENCOUNTER — Encounter: Payer: Medicare Other | Admitting: Family Medicine

## 2017-03-23 ENCOUNTER — Encounter: Payer: Medicare Other | Admitting: Family Medicine

## 2017-03-23 NOTE — Progress Notes (Signed)
I reviewed health advisor's note, was available for consultation, and agree with documentation and plan.  

## 2017-03-27 ENCOUNTER — Other Ambulatory Visit: Payer: Self-pay | Admitting: Family Medicine

## 2017-03-30 ENCOUNTER — Ambulatory Visit (INDEPENDENT_AMBULATORY_CARE_PROVIDER_SITE_OTHER): Payer: Medicare Other | Admitting: Family Medicine

## 2017-03-30 ENCOUNTER — Encounter: Payer: Self-pay | Admitting: Family Medicine

## 2017-03-30 VITALS — BP 122/68 | HR 58 | Temp 97.6°F | Ht 67.0 in | Wt 159.8 lb

## 2017-03-30 DIAGNOSIS — E039 Hypothyroidism, unspecified: Secondary | ICD-10-CM | POA: Diagnosis not present

## 2017-03-30 DIAGNOSIS — E7800 Pure hypercholesterolemia, unspecified: Secondary | ICD-10-CM | POA: Diagnosis not present

## 2017-03-30 DIAGNOSIS — I10 Essential (primary) hypertension: Secondary | ICD-10-CM

## 2017-03-30 DIAGNOSIS — Z0000 Encounter for general adult medical examination without abnormal findings: Secondary | ICD-10-CM

## 2017-03-30 DIAGNOSIS — Z794 Long term (current) use of insulin: Secondary | ICD-10-CM

## 2017-03-30 DIAGNOSIS — E1122 Type 2 diabetes mellitus with diabetic chronic kidney disease: Secondary | ICD-10-CM

## 2017-03-30 DIAGNOSIS — N183 Chronic kidney disease, stage 3 unspecified: Secondary | ICD-10-CM

## 2017-03-30 DIAGNOSIS — N289 Disorder of kidney and ureter, unspecified: Secondary | ICD-10-CM

## 2017-03-30 MED ORDER — LEVOTHYROXINE SODIUM 25 MCG PO TABS: ORAL_TABLET | ORAL | 3 refills | Status: DC

## 2017-03-30 MED ORDER — TAMSULOSIN HCL 0.4 MG PO CAPS: 0.4000 mg | ORAL_CAPSULE | Freq: Every day | ORAL | 3 refills | Status: DC

## 2017-03-30 MED ORDER — ROSUVASTATIN CALCIUM 5 MG PO TABS: 5.0000 mg | ORAL_TABLET | ORAL | 3 refills | Status: DC

## 2017-03-30 MED ORDER — GLIMEPIRIDE 4 MG PO TABS: ORAL_TABLET | ORAL | 3 refills | Status: DC

## 2017-03-30 MED ORDER — SITAGLIPTIN PHOSPHATE 50 MG PO TABS: 50.0000 mg | ORAL_TABLET | Freq: Every day | ORAL | 3 refills | Status: DC

## 2017-03-30 NOTE — Assessment & Plan Note (Signed)
Hypothyroidism  Pt has no clinical changes No change in energy level/ hair or skin/ edema and no tremor Lab Results  Component Value Date   TSH 3.18 03/19/2017

## 2017-03-30 NOTE — Assessment & Plan Note (Signed)
Lab Results  Component Value Date   CREATININE 1.49 03/19/2017   Continues renal f/u  Avoiding renal toxic meds Reminded to hydrate well

## 2017-03-30 NOTE — Assessment & Plan Note (Signed)
Lab Results  Component Value Date   HGBA1C 7.3 (H) 03/19/2017   This is up  Pt has been noncompliant with diet and also blood glucose checks (which I requested later in the day)  Will adj medicines when we get some results Also ref to DM teaching as pt has a poor understanding of DM diet and lifestyle  f/u in 3 mo with labs prior

## 2017-03-30 NOTE — Progress Notes (Signed)
Pre visit review using our clinic review tool, if applicable. No additional management support is needed unless otherwise documented below in the visit note. 

## 2017-03-30 NOTE — Assessment & Plan Note (Signed)
Disc goals for lipids and reasons to control them Rev labs with pt Rev low sat fat diet in detail  Continue crestor and diet

## 2017-03-30 NOTE — Progress Notes (Signed)
Subjective:    Patient ID: David Henry, male    DOB: 1926-06-01, 82 y.o.   MRN: [id number]  HPI  Here for health maintenance exam and to review chronic medical problems   Feeling ok overall   Had AMW on 3/29 He states he did not have a hearing test- and wife says his hearing is excellent  Mini cog 19/20 (missed one word of recall)= says he was not paying attention  Pretty sharp - no problems at home   Eye exam utd imms utd Zoster vaccine -pt states he already got it at [facility] Readings from Last 3 Encounters:  03/30/17 159 lb 12 oz (72.5 kg)  03/19/17 160 lb (72.6 kg)  12/19/16 160 lb (72.6 kg)  weight is stable and great  bmi 25.02   bp is stable today  No cp or palpitations or headaches or edema  No side effects to medicines  BP Readings from Last 3 Encounters:  03/30/17 122/68  03/19/17 (!) 144/78  12/19/16 136/72    Medication was added at last cardiology visit- ca channel blocker (Dr Nehemiah Massed)  CKD stage 3 was unchanged Disc DASH diet   Hypothyroidism  Pt has no clinical changes No change in energy level/ hair or skin/ edema and no tremor Lab Results  Component Value Date   TSH 3.18 03/19/2017      DM2 Lab Results  Component Value Date   HGBA1C 7.3 (H) 03/19/2017  this is up from 7.0 On amaryl and januvia and lantus  He is still only checking his blood sugar in am - getting readings of 70-110 - a few times below 70 Going out to eat - and glucose goes up  Does eat cereal  He is not exercising during the winter     Hx of hyperlipidemia Lab Results  Component Value Date   CHOL 108 03/19/2017   CHOL 108 12/12/2016   CHOL 105 06/25/2016   Lab Results  Component Value Date   HDL 46.70 03/19/2017   HDL 39.90 12/12/2016   HDL 36.10 (L) 06/25/2016   Lab Results  Component Value Date   LDLCALC 50 03/19/2017   LDLCALC 50 12/12/2016   LDLCALC 51 06/25/2016   Lab Results  Component Value Date   TRIG 59.0 03/19/2017   TRIG 92.0  12/12/2016   TRIG 93.0 06/25/2016   Lab Results  Component Value Date   CHOLHDL 2 03/19/2017   CHOLHDL 3 12/12/2016   CHOLHDL 3 06/25/2016   No results found for: LDLDIRECT crestor and diet   Patient Active Problem List   Diagnosis Date Noted  . Routine general medical examination at a health care facility 03/09/2016  . Constipation 09/17/2015  . Encounter for Medicare annual wellness exam 12/08/2014  . Hematuria, microscopic 05/26/2013  . Renal insufficiency 05/24/2013  . Anemia 11/23/2012  . Hypothyroid 11/23/2012  . GERD 11/29/2010  . CORONARY ARTERY DISEASE 04/18/2007  . Controlled type 2 diabetes mellitus with chronic kidney disease ([place]) 04/15/2007  . Hyperlipidemia 04/15/2007  . Essential hypertension 04/15/2007  . MYOCARDIAL INFARCTION, HX OF 04/15/2007  . ACTINIC KERATOSIS 04/15/2007   Past Medical History:  Diagnosis Date  . CAD (coronary artery disease)   . Colon polyp   . Diabetes mellitus    type II  . Hyperlipidemia   . Hypertension   . Kidney stones   . Neuropathy [facility])    Past Surgical History:  Procedure Laterality Date  . APPENDECTOMY  1984  .  CATARACT EXTRACTION  04/2001  . CHOLECYSTECTOMY  1989  . COLONOSCOPY N/A 2/14   hemorrhoids and tics (after pos ifob card)  . ESOPHAGOGASTRODUODENOSCOPY N/A 2/14  . RETINAL DETACHMENT SURGERY  2003  . SHOULDER SURGERY  2012   Lt shoulder repair   Social History  Substance Use Topics  . Smoking status: Never Smoker  . Smokeless tobacco: Never Used  . Alcohol use No   Family History  Problem Relation Age of Onset  . Cancer Brother     prostate CA  . Heart disease Mother   . Stroke Father   . Kidney disease Sister   . Kidney disease Sister   . Clotting disorder Sister   . COPD Brother   . Heart disease Brother    Allergies  Allergen Reactions  . Azithromycin     REACTION: nausea  . Celecoxib Other (See Comments)    Raises blood pressure  . Zocor [Simvastatin] Other (See Comments)     Muscle pain   Current Outpatient Prescriptions on File Prior to Visit  Medication Sig Dispense Refill  . acetaminophen (TYLENOL ARTHRITIS PAIN) 650 MG CR tablet Take 650 mg by mouth every 8 (eight) hours as needed.      Marland Kitchen amLODipine (NORVASC) 5 MG tablet Take 5 mg by mouth daily.    Marland Kitchen aspirin 81 MG tablet Take 81 mg by mouth daily.      . BD PEN NEEDLE NANO U/F 32G X 4 MM MISC USE AS DIRECTED WITH LANTUS 100 each 2  . glucose blood (ONE TOUCH ULTRA TEST) test strip CHECK BLOOD SUGAR TWICE DAILY AND AS DIRECTED FOR DM (DX. E11.9) 200 each 0  . guanFACINE (TENEX) 1 MG tablet TAKE 1 TABLET BY MOUTH DAILY AT BEDTIME 90 tablet 1  . LANTUS SOLOSTAR 100 UNIT/ML Solostar Pen INJECT 12 UNITS INTO THE SKIN EVERY MORNING 15 mL 5  . Multiple Vitamin (MULTIVITAMIN) capsule Take 1 capsule by mouth daily.      . NON FORMULARY 100 BC ultra fine needles    . Omega-3 Fatty Acids (FISH OIL PO) Take 2 capsules by mouth daily.      . [DISCONTINUED] omeprazole (PRILOSEC OTC) 20 MG tablet Take 1 tablet (20 mg total) by mouth daily. 90 tablet 1   No current facility-administered medications on file prior to visit.      Review of Systems Review of Systems  Constitutional: Negative for fever, appetite change, fatigue and unexpected weight change.  Eyes: Negative for pain and visual disturbance.  Respiratory: Negative for cough and shortness of breath.   Cardiovascular: Negative for cp or palpitations    Gastrointestinal: Negative for nausea, diarrhea and constipation.  Genitourinary: Negative for urgency and frequency. neg for excessive thirst  Skin: Negative for pallor or rash   Neurological: Negative for weakness, light-headedness, numbness and headaches.  Hematological: Negative for adenopathy. Does not bruise/bleed easily.  Psychiatric/Behavioral: Negative for dysphoric mood. The patient is not nervous/anxious.         Objective:   Physical Exam  Constitutional: He appears well-developed and  well-nourished. No distress.  Frail appearing elderly male   HENT:  Head: Normocephalic and atraumatic.  Right Ear: External ear normal.  Left Ear: External ear normal.  Nose: Nose normal.  Mouth/Throat: Oropharynx is clear and moist.  Eyes: Conjunctivae and EOM are normal. Pupils are equal, round, and reactive to light. Right eye exhibits no discharge. Left eye exhibits no discharge. No scleral icterus.  Neck: Normal range of motion.  Neck supple. No JVD present. Carotid bruit is not present. No thyromegaly present.  Cardiovascular: Normal rate, regular rhythm, normal heart sounds and intact distal pulses.  Exam reveals no gallop.   Pulmonary/Chest: Effort normal and breath sounds normal. No respiratory distress. He has no wheezes. He exhibits no tenderness.  Abdominal: Soft. Bowel sounds are normal. He exhibits no distension, no abdominal bruit and no mass. There is no tenderness.  Musculoskeletal: He exhibits no edema or tenderness.  Lymphadenopathy:    He has no cervical adenopathy.  Neurological: He is alert. He has normal reflexes. No cranial nerve deficit. He exhibits normal muscle tone. Coordination normal.  Skin: Skin is warm and dry. No rash noted. No erythema. No pallor.  sks and lentigines diffusely  Solar aging noted   Psychiatric: He has a normal mood and affect.          Assessment & Plan:   Problem List Items Addressed This Visit      Cardiovascular and Mediastinum   Essential hypertension - Primary    bp in fair control at this time  BP Readings from Last 1 Encounters:  03/30/17 122/68   No changes needed Disc lifstyle change with low sodium diet and exercise  Labs reviewed       Relevant Medications   rosuvastatin (CRESTOR) 5 MG tablet     Endocrine   Controlled type 2 diabetes mellitus with chronic kidney disease ([place])    Lab Results  Component Value Date   HGBA1C 7.3 (H) 03/19/2017   This is up  Pt has been noncompliant with diet and also blood  glucose checks (which I requested later in the day)  Will adj medicines when we get some results Also ref to DM teaching as pt has a poor understanding of DM diet and lifestyle  f/u in 3 mo with labs prior       Relevant Medications   glimepiride (AMARYL) 4 MG tablet   sitaGLIPtin (JANUVIA) 50 MG tablet   rosuvastatin (CRESTOR) 5 MG tablet   Other Relevant Orders   Amb Referral to Nutrition and Diabetic E   Hypothyroid    Hypothyroidism  Pt has no clinical changes No change in energy level/ hair or skin/ edema and no tremor Lab Results  Component Value Date   TSH 3.18 03/19/2017          Relevant Medications   levothyroxine (SYNTHROID, LEVOTHROID) 25 MCG tablet     Genitourinary   Renal insufficiency    Lab Results  Component Value Date   CREATININE 1.49 03/19/2017   Continues renal f/u  Avoiding renal toxic meds Reminded to hydrate well         Other   Hyperlipidemia    Disc goals for lipids and reasons to control them Rev labs with pt Rev low sat fat diet in detail  Continue crestor and diet         Relevant Medications   rosuvastatin (CRESTOR) 5 MG tablet   Routine general medical examination at a health care facility    Reviewed health habits including diet and exercise and skin cancer prevention Reviewed appropriate screening tests for age  Also reviewed health mt list, fam hx and immunization status , as well as social and family history   See hPIviewed AMW Declines hearing aides Disc memory expectations  Labs reviewed   re

## 2017-03-30 NOTE — Assessment & Plan Note (Signed)
bp in fair control at this time  BP Readings from Last 1 Encounters:  03/30/17 122/68   No changes needed Disc lifstyle change with low sodium diet and exercise  Labs reviewed

## 2017-03-30 NOTE — Assessment & Plan Note (Signed)
Reviewed health habits including diet and exercise and skin cancer prevention Reviewed appropriate screening tests for age  Also reviewed health mt list, fam hx and immunization status , as well as social and family history   See hPIviewed AMW Declines hearing aides Disc memory expectations  Labs reviewed   re

## 2017-03-30 NOTE — Patient Instructions (Addendum)
Try to eat out less Get you carbohydrates from fruit and vegetables instead of bread, pasta, rice, white potatoes and breading and crackers , and cereal  Go back to your diabetic education materials  You need 30 minutes of exercise per day (it does not need to be all at one time)  Start checking some blood sugar readings randomly in afternoon and evening and keep a log (and note how long after eating and what he ate)  Let's set you up for diabetic education   For constipation- switch to miralax and use daily - then once it improves you can decide how often you need it   Follow up in 3 months with labs prior

## 2017-04-06 ENCOUNTER — Telehealth: Payer: Self-pay | Admitting: Family Medicine

## 2017-04-06 NOTE — Telephone Encounter (Signed)
I am glad he is paying close attention to his sugar and carbohydrate counts and also exercising  It looks like he is getting a good feeling for what makes it go up and down  Keep working on it-and if he routinely gets blood glucose levels below 75 let me know

## 2017-04-06 NOTE — Telephone Encounter (Signed)
Placed on your desk. 

## 2017-04-06 NOTE — Telephone Encounter (Signed)
David Henry Self [phone]  Walker dropped off his Diabetes log, placed in RX box up front.

## 2017-04-07 NOTE — Telephone Encounter (Signed)
Pt notified of Dr. Marliss Coots comments and verbalized understanding, he will keep [place] updated on low BS readings

## 2017-05-13 ENCOUNTER — Other Ambulatory Visit: Payer: Self-pay | Admitting: Family Medicine

## 2017-06-08 ENCOUNTER — Other Ambulatory Visit: Payer: Self-pay | Admitting: Family Medicine

## 2017-06-29 ENCOUNTER — Other Ambulatory Visit (INDEPENDENT_AMBULATORY_CARE_PROVIDER_SITE_OTHER): Payer: Medicare Other

## 2017-06-29 DIAGNOSIS — E1122 Type 2 diabetes mellitus with diabetic chronic kidney disease: Secondary | ICD-10-CM | POA: Diagnosis not present

## 2017-06-29 DIAGNOSIS — I10 Essential (primary) hypertension: Secondary | ICD-10-CM

## 2017-06-29 DIAGNOSIS — N183 Chronic kidney disease, stage 3 (moderate): Secondary | ICD-10-CM

## 2017-06-29 DIAGNOSIS — N289 Disorder of kidney and ureter, unspecified: Secondary | ICD-10-CM | POA: Diagnosis not present

## 2017-06-29 DIAGNOSIS — Z794 Long term (current) use of insulin: Secondary | ICD-10-CM

## 2017-06-29 LAB — BASIC METABOLIC PANEL WITH GFR
BUN: 30 mg/dL — ABNORMAL HIGH (ref 6–23)
CO2: 27 meq/L (ref 19–32)
Calcium: 10 mg/dL (ref 8.4–10.5)
Chloride: 109 meq/L (ref 96–112)
Creatinine, Ser: 1.64 mg/dL — ABNORMAL HIGH (ref 0.40–1.50)
GFR: 42.25 mL/min — ABNORMAL LOW
Glucose, Bld: 108 mg/dL — ABNORMAL HIGH (ref 70–99)
Potassium: 4.6 meq/L (ref 3.5–5.1)
Sodium: 141 meq/L (ref 135–145)

## 2017-06-29 LAB — HEMOGLOBIN A1C: HEMOGLOBIN A1C: 6.6 % — AB (ref 4.6–6.5)

## 2017-07-01 ENCOUNTER — Ambulatory Visit (INDEPENDENT_AMBULATORY_CARE_PROVIDER_SITE_OTHER): Payer: Medicare Other | Admitting: Family Medicine

## 2017-07-01 ENCOUNTER — Encounter: Payer: Self-pay | Admitting: Family Medicine

## 2017-07-01 VITALS — BP 126/64 | HR 54 | Temp 97.5°F | Ht 67.0 in | Wt 157.5 lb

## 2017-07-01 DIAGNOSIS — E1122 Type 2 diabetes mellitus with diabetic chronic kidney disease: Secondary | ICD-10-CM

## 2017-07-01 DIAGNOSIS — N289 Disorder of kidney and ureter, unspecified: Secondary | ICD-10-CM

## 2017-07-01 DIAGNOSIS — Z794 Long term (current) use of insulin: Secondary | ICD-10-CM

## 2017-07-01 DIAGNOSIS — I10 Essential (primary) hypertension: Secondary | ICD-10-CM

## 2017-07-01 DIAGNOSIS — N183 Chronic kidney disease, stage 3 (moderate): Secondary | ICD-10-CM

## 2017-07-01 MED ORDER — INSULIN GLARGINE 100 UNIT/ML SOLOSTAR PEN: PEN_INJECTOR | SUBCUTANEOUS | 5 refills | Status: DC

## 2017-07-01 NOTE — Patient Instructions (Signed)
Keep doing what you are doing  Follow up in 6 months with labs prior   If your blood glucose levels stay low in the am - we will cut back on lantus  Keep track of what foods make your sugar high  Make sure to get enough protein  If you need a pm snack - you can try it

## 2017-07-01 NOTE — Assessment & Plan Note (Signed)
Lab rev  Cr 1.64 Enc hydration  Glucose control is better  F/u renal as planned

## 2017-07-01 NOTE — Progress Notes (Signed)
Subjective:    Patient ID: David Henry, male    DOB: 09-16-1926, 83 y.o.   MRN: [id number]  HPI Here for f/u of chronic medical problems   Wt Readings from Last 3 Encounters:  07/01/17 157 lb 8 oz (71.4 kg)  03/30/17 159 lb 12 oz (72.5 kg)  03/19/17 160 lb (72.6 kg)  down 2 lb  bmi 24.6  bp is stable today  No cp or palpitations or headaches or edema  No side effects to medicines  BP Readings from Last 3 Encounters:  07/01/17 126/64  03/30/17 122/68  03/19/17 (!) 144/78        Chemistry      Component Value Date/Time   NA 141 06/29/2017 0949   K 4.6 06/29/2017 0949   CL 109 06/29/2017 0949   CO2 27 06/29/2017 0949   BUN 30 (H) 06/29/2017 0949   CREATININE 1.64 (H) 06/29/2017 0949      Component Value Date/Time   CALCIUM 10.0 06/29/2017 0949   ALKPHOS 72 03/19/2017 1340   AST 25 03/19/2017 1340   ALT 29 03/19/2017 1340   BILITOT 1.1 03/19/2017 1340      Diabetes Home sugar results - a few low glucose levels in the am (60s-70s) - and he knows now how to prevent it  Usually 90s-100s in am , and later in the day 140-160  DM diet -improved , really watching carbohydrates / went to diabetic teaching  Teaching was helpful  Exercise - does what he can/ cuts crass and works in the yard [employer] with other yards (hip huts with prolonged grocery shopping)  Feet are ok  Symptoms A1C last  Lab Results  Component Value Date   HGBA1C 6.6 (H) 06/29/2017  this is down from 7.3 Last visit ref to DM teaching  No problems with medications  januvia 50 mg , lantus 15 units , amaryl  (he does not think he needs to reduce glimiperide at this time) Renal protection 12 17 Last eye exam  [facility]ce    Patient Active Problem List   Diagnosis Date Noted  . Routine general medical examination at a health care facility 03/09/2016  . Constipation 09/17/2015  . Encounter for Medicare annual wellness exam 12/08/2014  . Hematuria, microscopic 05/26/2013  . Renal insufficiency  05/24/2013  . Anemia 11/23/2012  . Hypothyroid 11/23/2012  . GERD 11/29/2010  . CORONARY ARTERY DISEASE 04/18/2007  . Controlled type 2 diabetes mellitus with chronic kidney disease ([place]) 04/15/2007  . Hyperlipidemia 04/15/2007  . Essential hypertension 04/15/2007  . MYOCARDIAL INFARCTION, HX OF 04/15/2007  . ACTINIC KERATOSIS 04/15/2007   Past Medical History:  Diagnosis Date  . CAD (coronary artery disease)   . Colon polyp   . Diabetes mellitus    type II  . Hyperlipidemia   . Hypertension   . Kidney stones   . Neuropathy    Past Surgical History:  Procedure Laterality Date  . APPENDECTOMY  1984  . CATARACT EXTRACTION  04/2001  . CHOLECYSTECTOMY  1989  . COLONOSCOPY N/A 2/14   hemorrhoids and tics (after pos ifob card)  . ESOPHAGOGASTRODUODENOSCOPY N/A 2/14  . RETINAL DETACHMENT SURGERY  2003  . SHOULDER SURGERY  2012   Lt shoulder repair   Social History  Substance Use Topics  . Smoking status: Never Smoker  . Smokeless tobacco: Never Used  . Alcohol use No   Family History  Problem Relation Age of Onset  . Cancer Brother  prostate CA  . Heart disease Mother   . Stroke Father   . Kidney disease Sister   . Kidney disease Sister   . Clotting disorder Sister   . COPD Brother   . Heart disease Brother    Allergies  Allergen Reactions  . Azithromycin     REACTION: nausea  . Celecoxib Other (See Comments)    Raises blood pressure  . Zocor [Simvastatin] Other (See Comments)    Muscle pain   Current Outpatient Prescriptions on File Prior to Visit  Medication Sig Dispense Refill  . acetaminophen (TYLENOL ARTHRITIS PAIN) 650 MG CR tablet Take 650 mg by mouth every 8 (eight) hours as needed.      Marland Kitchen amLODipine (NORVASC) 5 MG tablet Take 5 mg by mouth daily.    Marland Kitchen aspirin 81 MG tablet Take 81 mg by mouth daily.      . BD PEN NEEDLE NANO U/F 32G X 4 MM MISC USE AS DIRECTED WITH LANTUS 100 each 2  . glimepiride (AMARYL) 4 MG tablet TAKE 1 TABLET BY MOUTH  EVERY MORNING ANDTAKE 1 TABLET EVERY EVENING 180 tablet 3  . glucose blood (ONE TOUCH ULTRA TEST) test strip CHECK BLOOD SUGAR TWICE DAILY AND AS DIRECTED FOR DM (DX. E11.9) 200 each 1  . guanFACINE (TENEX) 1 MG tablet TAKE 1 TABLET BY MOUTH DAILY AT BEDTIME 90 tablet 0  . levothyroxine (SYNTHROID, LEVOTHROID) 25 MCG tablet TAKE 1 TABLET BY MOUTH ONCE A DAY BEFOREBREAKFAST 90 tablet 3  . Multiple Vitamin (MULTIVITAMIN) capsule Take 1 capsule by mouth daily.      . NON FORMULARY 100 BC ultra fine needles    . Omega-3 Fatty Acids (FISH OIL PO) Take 2 capsules by mouth daily.      . rosuvastatin (CRESTOR) 5 MG tablet Take 1 tablet (5 mg total) by mouth every other day. 45 tablet 3  . senna (SENOKOT) 8.6 MG tablet Take 1 tablet by mouth 2 (two) times daily.    . sitaGLIPtin (JANUVIA) 50 MG tablet Take 1 tablet (50 mg total) by mouth daily. 90 tablet 3  . tamsulosin (FLOMAX) 0.4 MG CAPS capsule Take 1 capsule (0.4 mg total) by mouth daily. 90 capsule 3  . [DISCONTINUED] omeprazole (PRILOSEC OTC) 20 MG tablet Take 1 tablet (20 mg total) by mouth daily. 90 tablet 1   No current facility-administered medications on file prior to visit.     Review of Systems    Review of Systems  Constitutional: Negative for fever, appetite change, fatigue and unexpected weight change.  Eyes: Negative for pain and visual disturbance.  Respiratory: Negative for cough and shortness of breath.   Cardiovascular: Negative for cp or palpitations    Gastrointestinal: Negative for nausea, diarrhea and constipation.  Genitourinary: Negative for urgency and frequency.  Skin: Negative for pallor or rash   Neurological: Negative for weakness, light-headedness, numbness and headaches.  Hematological: Negative for adenopathy. Does not bruise/bleed easily.  Psychiatric/Behavioral: Negative for dysphoric mood. The patient is not nervous/anxious.      Objective:   Physical Exam  Constitutional: He appears well-developed and  well-nourished. No distress.  Well appearing elderly male  HENT:  Head: Normocephalic and atraumatic.  Mouth/Throat: Oropharynx is clear and moist.  Eyes: Conjunctivae and EOM are normal. Pupils are equal, round, and reactive to light.  Neck: Normal range of motion. Neck supple. No JVD present. Carotid bruit is not present. No thyromegaly present.  Cardiovascular: Regular rhythm, normal heart sounds and intact distal  pulses.  Exam reveals no gallop.   Baseline bradycardia w/o symptoms (seeing cardiology)  Pulmonary/Chest: Effort normal and breath sounds normal. No respiratory distress. He has no wheezes. He has no rales.  No crackles  Abdominal: Soft. Bowel sounds are normal. He exhibits no distension, no abdominal bruit and no mass. There is no tenderness.  Musculoskeletal: He exhibits no edema.  Lymphadenopathy:    He has no cervical adenopathy.  Neurological: He is alert. He has normal reflexes. No cranial nerve deficit. He exhibits normal muscle tone. Coordination normal.  Skin: Skin is warm and dry. No rash noted.  Psychiatric: He has a normal mood and affect.          Assessment & Plan:   Problem List Items Addressed This Visit      Cardiovascular and Mediastinum   Essential hypertension - Primary    bp in fair control at this time  BP Readings from Last 1 Encounters:  07/01/17 126/64   No changes needed Disc lifstyle change with low sodium diet and exercise  Reminded to hydrate with outdoor work Labs rev F/u 6 mo        Endocrine   Controlled type 2 diabetes mellitus with chronic kidney disease ([place])    Improved after DM education and carb counting  lantus 15 u daily  Watch for am low glucose and adj as needed Pm snack prn  Lab Results  Component Value Date   HGBA1C 6.6 (H) 06/29/2017   down from 7.3 Commended  F/u 6 mo lab prior       Relevant Medications   Insulin Glargine (LANTUS SOLOSTAR) 100 UNIT/ML Solostar Pen     Genitourinary   Renal  insufficiency    Lab rev  Cr 1.64 Enc hydration  Glucose control is better  F/u renal as planned

## 2017-07-01 NOTE — Assessment & Plan Note (Signed)
Improved after DM education and carb counting  lantus 15 u daily  Watch for am low glucose and adj as needed Pm snack prn  Lab Results  Component Value Date   HGBA1C 6.6 (H) 06/29/2017   down from 7.3 Commended  F/u 6 mo lab prior

## 2017-07-01 NOTE — Assessment & Plan Note (Signed)
bp in fair control at this time  BP Readings from Last 1 Encounters:  07/01/17 126/64   No changes needed Disc lifstyle change with low sodium diet and exercise  Reminded to hydrate with outdoor work Labs rev F/u 6 mo

## 2017-09-01 ENCOUNTER — Other Ambulatory Visit: Payer: Self-pay | Admitting: Family Medicine

## 2017-11-16 ENCOUNTER — Other Ambulatory Visit: Payer: Self-pay | Admitting: Unknown Physician Specialty

## 2017-11-16 DIAGNOSIS — M25511 Pain in right shoulder: Secondary | ICD-10-CM

## 2017-11-23 ENCOUNTER — Ambulatory Visit
Admission: RE | Admit: 2017-11-23 | Discharge: 2017-11-23 | Disposition: A | Discharge status: Home / Self Care | Payer: Medicare Other | Source: Ambulatory Visit | Attending: Unknown Physician Specialty | Admitting: Unknown Physician Specialty

## 2017-11-23 DIAGNOSIS — M25511 Pain in right shoulder: Secondary | ICD-10-CM | POA: Diagnosis not present

## 2017-11-23 DIAGNOSIS — M75101 Unspecified rotator cuff tear or rupture of right shoulder, not specified as traumatic: Secondary | ICD-10-CM | POA: Diagnosis not present

## 2017-11-23 DIAGNOSIS — M13811 Other specified arthritis, right shoulder: Secondary | ICD-10-CM | POA: Diagnosis not present

## 2017-12-09 ENCOUNTER — Other Ambulatory Visit (INDEPENDENT_AMBULATORY_CARE_PROVIDER_SITE_OTHER): Payer: Medicare Other

## 2017-12-09 DIAGNOSIS — N289 Disorder of kidney and ureter, unspecified: Secondary | ICD-10-CM

## 2017-12-09 DIAGNOSIS — N183 Chronic kidney disease, stage 3 (moderate): Secondary | ICD-10-CM | POA: Diagnosis not present

## 2017-12-09 DIAGNOSIS — Z794 Long term (current) use of insulin: Secondary | ICD-10-CM | POA: Diagnosis not present

## 2017-12-09 DIAGNOSIS — I10 Essential (primary) hypertension: Secondary | ICD-10-CM

## 2017-12-09 DIAGNOSIS — E1122 Type 2 diabetes mellitus with diabetic chronic kidney disease: Secondary | ICD-10-CM | POA: Diagnosis not present

## 2017-12-09 LAB — CBC WITH DIFFERENTIAL/PLATELET
BASOS ABS: 0.1 10*3/uL (ref 0.0–0.1)
BASOS PCT: 0.7 % (ref 0.0–3.0)
EOS ABS: 0.4 10*3/uL (ref 0.0–0.7)
Eosinophils Relative: 5.3 % — ABNORMAL HIGH (ref 0.0–5.0)
HEMATOCRIT: 37.9 % — AB (ref 39.0–52.0)
Hemoglobin: 12.6 g/dL — ABNORMAL LOW (ref 13.0–17.0)
LYMPHS PCT: 21.9 % (ref 12.0–46.0)
Lymphs Abs: 1.7 10*3/uL (ref 0.7–4.0)
MCHC: 33.2 g/dL (ref 30.0–36.0)
MCV: 102.1 fl — ABNORMAL HIGH (ref 78.0–100.0)
MONO ABS: 0.7 10*3/uL (ref 0.1–1.0)
Monocytes Relative: 9 % (ref 3.0–12.0)
NEUTROS ABS: 4.8 10*3/uL (ref 1.4–7.7)
NEUTROS PCT: 63.1 % (ref 43.0–77.0)
PLATELETS: 242 10*3/uL (ref 150.0–400.0)
RBC: 3.71 Mil/uL — ABNORMAL LOW (ref 4.22–5.81)
RDW: 13.7 % (ref 11.5–15.5)
WBC: 7.7 10*3/uL (ref 4.0–10.5)

## 2017-12-09 LAB — COMPREHENSIVE METABOLIC PANEL
ALBUMIN: 3.9 g/dL (ref 3.5–5.2)
ALT: 21 U/L (ref 0–53)
AST: 20 U/L (ref 0–37)
Alkaline Phosphatase: 69 U/L (ref 39–117)
BILIRUBIN TOTAL: 0.7 mg/dL (ref 0.2–1.2)
BUN: 27 mg/dL — AB (ref 6–23)
CALCIUM: 9 mg/dL (ref 8.4–10.5)
CO2: 28 meq/L (ref 19–32)
CREATININE: 1.76 mg/dL — AB (ref 0.40–1.50)
Chloride: 110 mEq/L (ref 96–112)
GFR: 38.91 mL/min — ABNORMAL LOW (ref >60.00)
Glucose, Bld: 73 mg/dL (ref 70–99)
Potassium: 4.3 mEq/L (ref 3.5–5.1)
SODIUM: 143 meq/L (ref 135–145)
Total Protein: 6.6 g/dL (ref 6.0–8.3)

## 2017-12-09 LAB — HEMOGLOBIN A1C: Hgb A1c MFr Bld: 7.1 % — ABNORMAL HIGH (ref 4.6–6.5)

## 2017-12-09 LAB — LIPID PANEL
Cholesterol: 97 mg/dL (ref 0–200)
HDL: 36.6 mg/dL — ABNORMAL LOW (ref >39.00)
LDL Cholesterol: 43 mg/dL (ref 0–99)
NONHDL: 60.15
TRIGLYCERIDES: 87 mg/dL (ref 0.0–149.0)
Total CHOL/HDL Ratio: 3
VLDL: 17.4 mg/dL (ref 0.0–40.0)

## 2017-12-14 ENCOUNTER — Encounter: Payer: Self-pay | Admitting: Family Medicine

## 2017-12-14 ENCOUNTER — Ambulatory Visit: Payer: Medicare Other | Admitting: Family Medicine

## 2017-12-14 VITALS — BP 124/68 | HR 55 | Temp 97.5°F | Ht 67.0 in | Wt 159.5 lb

## 2017-12-14 DIAGNOSIS — E1122 Type 2 diabetes mellitus with diabetic chronic kidney disease: Secondary | ICD-10-CM | POA: Diagnosis not present

## 2017-12-14 DIAGNOSIS — I10 Essential (primary) hypertension: Secondary | ICD-10-CM

## 2017-12-14 DIAGNOSIS — E039 Hypothyroidism, unspecified: Secondary | ICD-10-CM

## 2017-12-14 DIAGNOSIS — E7800 Pure hypercholesterolemia, unspecified: Secondary | ICD-10-CM

## 2017-12-14 DIAGNOSIS — N183 Chronic kidney disease, stage 3 unspecified: Secondary | ICD-10-CM

## 2017-12-14 DIAGNOSIS — N289 Disorder of kidney and ureter, unspecified: Secondary | ICD-10-CM

## 2017-12-14 DIAGNOSIS — D649 Anemia, unspecified: Secondary | ICD-10-CM

## 2017-12-14 DIAGNOSIS — I2510 Atherosclerotic heart disease of native coronary artery without angina pectoris: Secondary | ICD-10-CM

## 2017-12-14 DIAGNOSIS — Z01818 Encounter for other preprocedural examination: Secondary | ICD-10-CM | POA: Diagnosis not present

## 2017-12-14 DIAGNOSIS — Z794 Long term (current) use of insulin: Secondary | ICD-10-CM

## 2017-12-14 NOTE — Progress Notes (Signed)
Subjective:    Patient ID: David Henry, male    DOB: 09/12/1926, 83 y.o.   MRN: [id number]  HPI  Here for f/u of chronic health problems   Feeling good except for shoulder  Has to have surgery - waiting to schedule   Next appt with cardiology is going to be in April  Needs cardiology clearance   He had urinary retention for the last surgery    Wt Readings from Last 3 Encounters:  12/14/17 159 lb 8 oz (72.3 kg)  07/01/17 157 lb 8 oz (71.4 kg)  03/30/17 159 lb 12 oz (72.5 kg)  wt is up 2 lb  24.98 kg/m    bp is stable today  No cp or palpitations or headaches or edema  No side effects to medicines  BP Readings from Last 3 Encounters:  12/14/17 124/68  07/01/17 126/64  03/30/17 122/68     Takes amlodipine  Very well controlled   Now has a pace maker  Pulse Readings from Last 3 Encounters:  12/14/17 (!) 55  07/01/17 (!) 54  03/30/17 (!) 58  no dizziness/syncope or pre syncope   Hypothyroidism  Pt has no clinical changes No change in energy level/ hair or skin/ edema and no tremor Lab Results  Component Value Date   TSH 3.18 03/19/2017     DM2 Diabetes Home sugar results -few highs - usually in am 80s-90s  Not checking during the day  Eating 3 cookies per day and some crackers  DM diet - eating well for most part  Holidays have affected it  Exercise - not a lot due to cold weather  Symptoms-none  A1C last  Lab Results  Component Value Date   HGBA1C 7.1 (H) 12/09/2017  this is up from 6.6  Takes amaryl and lantus and crestor   No problems with medications  Last eye exam 12/17  Renal insuf Lab Results  Component Value Date   CREATININE 1.76 (H) 12/09/2017   BUN 27 (H) 12/09/2017   NA 143 12/09/2017   K 4.3 12/09/2017   CL 110 12/09/2017   CO2 28 12/09/2017   drinks water  Sees renal   Lab Results  Component Value Date   WBC 7.7 12/09/2017   HGB 12.6 (L) 12/09/2017   HCT 37.9 (L) 12/09/2017   MCV 102.1 (H) 12/09/2017   PLT 242.0  12/09/2017   formerly saw hematology/on iron   Hyperlipidemia Lab Results  Component Value Date   CHOL 97 12/09/2017   CHOL 108 03/19/2017   CHOL 108 12/12/2016   Lab Results  Component Value Date   HDL 36.60 (L) 12/09/2017   HDL 46.70 03/19/2017   HDL 39.90 12/12/2016   Lab Results  Component Value Date   LDLCALC 43 12/09/2017   LDLCALC 50 03/19/2017   LDLCALC 50 12/12/2016   Lab Results  Component Value Date   TRIG 87.0 12/09/2017   TRIG 59.0 03/19/2017   TRIG 92.0 12/12/2016   Lab Results  Component Value Date   CHOLHDL 3 12/09/2017   CHOLHDL 2 03/19/2017   CHOLHDL 3 12/12/2016   No results found for: LDLDIRECT On crestor   Patient Active Problem List   Diagnosis Date Noted  . Pre-op examination 12/14/2017  . Routine general medical examination at a health care facility 03/09/2016  . Constipation 09/17/2015  . Encounter for Medicare annual wellness exam 12/08/2014  . Hematuria, microscopic 05/26/2013  . Renal insufficiency 05/24/2013  . Anemia 11/23/2012  . Hypothyroid  11/23/2012  . GERD 11/29/2010  . CORONARY ARTERY DISEASE 04/18/2007  . Controlled type 2 diabetes mellitus with chronic kidney disease ([place]) 04/15/2007  . Hyperlipidemia 04/15/2007  . Essential hypertension 04/15/2007  . MYOCARDIAL INFARCTION, HX OF 04/15/2007  . ACTINIC KERATOSIS 04/15/2007   Past Medical History:  Diagnosis Date  . CAD (coronary artery disease)   . Colon polyp   . Diabetes mellitus    type II  . Hyperlipidemia   . Hypertension   . Kidney stones   . Neuropathy    Past Surgical History:  Procedure Laterality Date  . APPENDECTOMY  1984  . CATARACT EXTRACTION  04/2001  . CHOLECYSTECTOMY  1989  . COLONOSCOPY N/A 2/14   hemorrhoids and tics (after pos ifob card)  . ESOPHAGOGASTRODUODENOSCOPY N/A 2/14  . RETINAL DETACHMENT SURGERY  2003  . SHOULDER SURGERY  2012   Lt shoulder repair   Social History   Tobacco Use  . Smoking status: Never Smoker  .  Smokeless tobacco: Never Used  Substance Use Topics  . Alcohol use: No    Alcohol/week: 0.0 oz  . Drug use: No   Family History  Problem Relation Age of Onset  . Cancer Brother        prostate CA  . Heart disease Mother   . Stroke Father   . Kidney disease Sister   . Kidney disease Sister   . Clotting disorder Sister   . COPD Brother   . Heart disease Brother    Allergies  Allergen Reactions  . Azithromycin     REACTION: nausea  . Celecoxib Other (See Comments)    Raises blood pressure  . Zocor [Simvastatin] Other (See Comments)    Muscle pain   Current Outpatient Medications on File Prior to Visit  Medication Sig Dispense Refill  . acetaminophen (TYLENOL ARTHRITIS PAIN) 650 MG CR tablet Take 650 mg by mouth every 8 (eight) hours as needed.      Marland Kitchen amLODipine (NORVASC) 5 MG tablet Take 5 mg by mouth daily.    Marland Kitchen aspirin 81 MG tablet Take 81 mg by mouth daily.      . BD PEN NEEDLE NANO U/F 32G X 4 MM MISC USE AS DIRECTED WITH LANTUS 100 each 2  . glimepiride (AMARYL) 4 MG tablet TAKE 1 TABLET BY MOUTH EVERY MORNING ANDTAKE 1 TABLET EVERY EVENING 180 tablet 3  . glucose blood (ONE TOUCH ULTRA TEST) test strip CHECK BLOOD SUGAR TWICE DAILY AND AS DIRECTED FOR DM (DX. E11.9) 200 each 1  . guanFACINE (TENEX) 1 MG tablet TAKE 1 TABLET BY MOUTH ONCE DAILY AT BEDTIME 90 tablet 1  . Insulin Glargine (LANTUS SOLOSTAR) 100 UNIT/ML Solostar Pen INJECT 15 UNITS INTO THE SKIN EVERY MORNING 15 mL 5  . levothyroxine (SYNTHROID, LEVOTHROID) 25 MCG tablet TAKE 1 TABLET BY MOUTH ONCE A DAY BEFOREBREAKFAST 90 tablet 3  . Multiple Vitamin (MULTIVITAMIN) capsule Take 1 capsule by mouth daily.      . NON FORMULARY 100 BC ultra fine needles    . Omega-3 Fatty Acids (FISH OIL PO) Take 2 capsules by mouth daily.      . rosuvastatin (CRESTOR) 5 MG tablet Take 1 tablet (5 mg total) by mouth every other day. 45 tablet 3  . senna (SENOKOT) 8.6 MG tablet Take 1 tablet by mouth 2 (two) times daily.    .  sitaGLIPtin (JANUVIA) 50 MG tablet Take 1 tablet (50 mg total) by mouth daily. 90 tablet 3  .  tamsulosin (FLOMAX) 0.4 MG CAPS capsule Take 1 capsule (0.4 mg total) by mouth daily. 90 capsule 3  . [DISCONTINUED] omeprazole (PRILOSEC OTC) 20 MG tablet Take 1 tablet (20 mg total) by mouth daily. 90 tablet 1   No current facility-administered medications on file prior to visit.     Review of Systems  Constitutional: Negative for activity change, appetite change, fatigue, fever and unexpected weight change.  HENT: Negative for congestion, rhinorrhea, sore throat and trouble swallowing.   Eyes: Negative for pain, redness, itching and visual disturbance.  Respiratory: Negative for cough, chest tightness, shortness of breath and wheezing.   Cardiovascular: Negative for chest pain and palpitations.  Gastrointestinal: Negative for abdominal pain, blood in stool, constipation, diarrhea and nausea.  Endocrine: Negative for cold intolerance, heat intolerance, polydipsia and polyuria.  Genitourinary: Negative for difficulty urinating, dysuria, frequency and urgency.       Pos for urinary retention after surgeries in the past req cath on d/c  Musculoskeletal: Negative for arthralgias, joint swelling and myalgias.  Skin: Negative for pallor and rash.  Neurological: Negative for dizziness, tremors, weakness, numbness and headaches.  Hematological: Negative for adenopathy. Does not bruise/bleed easily.  Psychiatric/Behavioral: Negative for decreased concentration and dysphoric mood. The patient is not nervous/anxious.        Objective:   Physical Exam  Constitutional: He appears well-developed and well-nourished. No distress.  Well appearing elderly male   HENT:  Head: Normocephalic and atraumatic.  Mouth/Throat: Oropharynx is clear and moist.  Eyes: Conjunctivae and EOM are normal. Pupils are equal, round, and reactive to light.  Neck: Normal range of motion. Neck supple. No JVD present. Carotid bruit  is not present. No thyromegaly present.  Cardiovascular: Normal rate, regular rhythm, normal heart sounds and intact distal pulses. Exam reveals no gallop.  Pulmonary/Chest: Effort normal and breath sounds normal. No respiratory distress. He has no wheezes. He has no rales.  No crackles  Abdominal: Soft. Bowel sounds are normal. He exhibits no distension, no abdominal bruit and no mass. There is no tenderness.  Musculoskeletal: He exhibits no edema.  Lymphadenopathy:    He has no cervical adenopathy.  Neurological: He is alert. He has normal reflexes.  Skin: Skin is warm and dry. No rash noted.  Psychiatric: He has a normal mood and affect.  Cheerful and talkative           Assessment & Plan:   Problem List Items Addressed This Visit      Cardiovascular and Mediastinum   Coronary atherosclerosis    Doing well clinically  Also no problems with pacemaker Will need cardiac clearance for upcoming shoulder surgery      Essential hypertension    bp in fair control at this time  BP Readings from Last 1 Encounters:  12/14/17 124/68   No changes needed Disc lifstyle change with low sodium diet and exercise  Labs reviewed  Enc more indoor exercise as tolerated        Endocrine   Controlled type 2 diabetes mellitus with chronic kidney disease ([place]) - Primary    A1C is up significantly with less exercise and dietary indiscretion  Lab Results  Component Value Date   HGBA1C 7.1 (H) 12/09/2017    Disc plan for 30 min of indoor exercise daily  Also DM diet  Apprehensive to change lantus due to relatively low am glucose readings (he will start checking some pm sugar readings) Continues januvia and amaryl  Has renal insuff Disc eye and foot  care (needs moisturizer for feet) F/u 3 mo       Hypothyroid    Lab Results  Component Value Date   TSH 3.18 03/19/2017   No clinical changes         Genitourinary   Renal insufficiency    Lab Results  Component Value Date    CREATININE 1.76 (H) 12/09/2017   Up a bit Disc imp of fluid intake (64 oz daily)  Avoid nephrotoxic meds  F/u renal as planned        Other   Anemia    Hb 12.6  Not currently taking iron       Hyperlipidemia    Disc goals for lipids and reasons to control them Rev labs with pt Rev low sat fat diet in detail  Low LDL  Recommend exercise to inc HDL       Pre-op examination    From medical standpoint ok for surgery but needs cardiac clearance  Will arrange that appt   (pacemaker and cad)  Recommend watching blood sugar and renal fxn closely during surgery  Also noted-hx of urinary retention (significant) with prior surgeries  Will send copy of note to surgeon       Relevant Orders   Ambulatory referral to Cardiology

## 2017-12-14 NOTE — Patient Instructions (Addendum)
You need to get some indoor exercise ! Please try to find some options for this (at least 30 minutes per day)  Start checking some blood glucose levels during the day (2 hours after meals) to see where you are   Work on water intake  Work on 64 oz per day (mostly water)   You will need to see cardiology for clearance for your surgery  Stop at check out so we can work on that   Follow up in 3 months for blood sugar

## 2017-12-15 NOTE — Assessment & Plan Note (Signed)
From medical standpoint ok for surgery but needs cardiac clearance  Will arrange that appt   (pacemaker and cad)  Recommend watching blood sugar and renal fxn closely during surgery  Also noted-hx of urinary retention (significant) with prior surgeries  Will send copy of note to surgeon

## 2017-12-15 NOTE — Assessment & Plan Note (Signed)
bp in fair control at this time  BP Readings from Last 1 Encounters:  12/14/17 124/68   No changes needed Disc lifstyle change with low sodium diet and exercise  Labs reviewed  Enc more indoor exercise as tolerated

## 2017-12-15 NOTE — Assessment & Plan Note (Signed)
Lab Results  Component Value Date   CREATININE 1.76 (H) 12/09/2017   Up a bit Disc imp of fluid intake (64 oz daily)  Avoid nephrotoxic meds  F/u renal as planned

## 2017-12-15 NOTE — Assessment & Plan Note (Signed)
Lab Results  Component Value Date   TSH 3.18 03/19/2017   No clinical changes

## 2017-12-15 NOTE — Assessment & Plan Note (Signed)
Doing well clinically  Also no problems with pacemaker Will need cardiac clearance for upcoming shoulder surgery

## 2017-12-15 NOTE — Assessment & Plan Note (Signed)
Disc goals for lipids and reasons to control them Rev labs with pt Rev low sat fat diet in detail  Low LDL  Recommend exercise to inc HDL

## 2017-12-15 NOTE — Assessment & Plan Note (Signed)
A1C is up significantly with less exercise and dietary indiscretion  Lab Results  Component Value Date   HGBA1C 7.1 (H) 12/09/2017    Disc plan for 30 min of indoor exercise daily  Also DM diet  Apprehensive to change lantus due to relatively low am glucose readings (he will start checking some pm sugar readings) Continues januvia and amaryl  Has renal insuff Disc eye and foot care (needs moisturizer for feet) F/u 3 mo

## 2017-12-15 NOTE — Assessment & Plan Note (Signed)
Hb 12.6  Not currently taking iron

## 2017-12-18 ENCOUNTER — Other Ambulatory Visit: Payer: Self-pay | Admitting: Family Medicine

## 2017-12-29 ENCOUNTER — Other Ambulatory Visit: Payer: Medicare Other

## 2017-12-29 ENCOUNTER — Other Ambulatory Visit: Payer: Self-pay

## 2017-12-29 ENCOUNTER — Encounter
Admission: RE | Admit: 2017-12-29 | Discharge: 2017-12-29 | Disposition: A | Discharge status: Home / Self Care | Payer: Medicare Other | Source: Ambulatory Visit | Attending: Unknown Physician Specialty | Admitting: Unknown Physician Specialty

## 2017-12-29 DIAGNOSIS — E1151 Type 2 diabetes mellitus with diabetic peripheral angiopathy without gangrene: Secondary | ICD-10-CM | POA: Diagnosis not present

## 2017-12-29 DIAGNOSIS — I129 Hypertensive chronic kidney disease with stage 1 through stage 4 chronic kidney disease, or unspecified chronic kidney disease: Secondary | ICD-10-CM | POA: Diagnosis not present

## 2017-12-29 DIAGNOSIS — Z79899 Other long term (current) drug therapy: Secondary | ICD-10-CM | POA: Diagnosis not present

## 2017-12-29 DIAGNOSIS — I2510 Atherosclerotic heart disease of native coronary artery without angina pectoris: Secondary | ICD-10-CM | POA: Diagnosis not present

## 2017-12-29 DIAGNOSIS — K219 Gastro-esophageal reflux disease without esophagitis: Secondary | ICD-10-CM | POA: Diagnosis not present

## 2017-12-29 DIAGNOSIS — Z881 Allergy status to other antibiotic agents status: Secondary | ICD-10-CM | POA: Diagnosis not present

## 2017-12-29 DIAGNOSIS — M75111 Incomplete rotator cuff tear or rupture of right shoulder, not specified as traumatic: Secondary | ICD-10-CM | POA: Diagnosis present

## 2017-12-29 DIAGNOSIS — E1122 Type 2 diabetes mellitus with diabetic chronic kidney disease: Secondary | ICD-10-CM | POA: Diagnosis not present

## 2017-12-29 DIAGNOSIS — I252 Old myocardial infarction: Secondary | ICD-10-CM | POA: Diagnosis not present

## 2017-12-29 DIAGNOSIS — Z794 Long term (current) use of insulin: Secondary | ICD-10-CM | POA: Diagnosis not present

## 2017-12-29 DIAGNOSIS — I6523 Occlusion and stenosis of bilateral carotid arteries: Secondary | ICD-10-CM | POA: Diagnosis not present

## 2017-12-29 DIAGNOSIS — M7521 Bicipital tendinitis, right shoulder: Secondary | ICD-10-CM | POA: Diagnosis not present

## 2017-12-29 DIAGNOSIS — M65811 Other synovitis and tenosynovitis, right shoulder: Secondary | ICD-10-CM | POA: Diagnosis not present

## 2017-12-29 DIAGNOSIS — E785 Hyperlipidemia, unspecified: Secondary | ICD-10-CM | POA: Diagnosis not present

## 2017-12-29 DIAGNOSIS — N189 Chronic kidney disease, unspecified: Secondary | ICD-10-CM | POA: Diagnosis not present

## 2017-12-29 DIAGNOSIS — Z888 Allergy status to other drugs, medicaments and biological substances status: Secondary | ICD-10-CM | POA: Diagnosis not present

## 2017-12-29 DIAGNOSIS — Z955 Presence of coronary angioplasty implant and graft: Secondary | ICD-10-CM | POA: Diagnosis not present

## 2017-12-29 HISTORY — DX: Gastro-esophageal reflux disease without esophagitis: K21.9

## 2017-12-29 HISTORY — DX: Hypothyroidism, unspecified: E03.9

## 2017-12-29 HISTORY — DX: Peripheral vascular disease, unspecified: I73.9

## 2017-12-29 HISTORY — DX: Unspecified osteoarthritis, unspecified site: M19.90

## 2017-12-29 HISTORY — DX: Dyspnea, unspecified: R06.00

## 2017-12-29 NOTE — Pre-Procedure Instructions (Signed)
Patient states that he had difficulty voiding after prior RCR surgery that required a foley to be inserted.  He was concerned that this may happen again and spoke to dr. Jefm Bryant regarding this situation.  They had a discussion about inserting a foley in the OR for this surgery.

## 2017-12-29 NOTE — Patient Instructions (Addendum)
Your procedure is scheduled on: December 30, 2017  Report to [facility], 2ND FLOOR  ARRIVE  IN [facility] AT 9:30 am    Remember: Instructions that are not followed completely may result in serious medical risk, up to and including death, or upon the discretion of your surgeon and anesthesiologist your surgery may need to be rescheduled.     _X__ 1. Do not eat food after midnight the night before your procedure.                 No gum chewing or hard candies. You may drink clear liquids up to 2 hours                 before you are scheduled to arrive for your surgery- DO not drink clear                 liquids within 2 hours of the start of your surgery.                 Clear Liquids include:  water,       ____ 2.  No Alcohol for 24 hours before or after surgery.   _ __ 3.  Do Not Smoke or use e-cigarettes For 24 Hours Prior to Your Surgery.                 Do not use any chewable tobacco products for at least 6 hours prior to                 surgery.  ____  4.  Bring all medications with you on the day of surgery if instructed.   ____  5.  Notify your doctor if there is any change in your medical condition      (cold, fever, infections).     Do not wear jewelry, make-up, hairpins, clips or nail polish. Do not wear lotions, powders, or perfumes. You may wear deodorant. Do not shave 48 hours prior to surgery. Men may shave face and neck. Do not bring valuables to the hospital.    [facility] is not responsible for any belongings or valuables.  Contacts, dentures or bridgework may not be worn into surgery. Leave your suitcase in the car. After surgery it may be brought to your room. For patients admitted to the hospital, discharge time is determined by your treatment team.   Patients discharged the day of surgery will not be allowed to drive home.   Please read over the following fact sheets that you were given:   PREPARING FOR SURGERY   ____ Take these  medicines the morning of surgery with A SIP OF WATER:    1. AMLODIPINE  2.  SYNTHROID  3.  FLOMAX  4.  TRAMADOL, IF NEED TO FOR PAIN  5.  OVER THE COUNTER REFLUX MEDICINE.  PLEASE TAKE TONIGHT AND AGAIN IN THE MORNING.  6.  ____ Fleet Enema (as directed)   _X___ Use CHG Soap as directed  ____ Use inhalers on the day of surgery  _X___ Stop metformin AS OF TODAY  __X__ DO NOT TAKE ANY INSULIN IN THE MORNING.  DO NOT TAKE JANUVIA, GLIMIPERIDE OR LANTUS AT ALL IN THE MORNING.  __X__ Stop ASPIRIN AS OF NOW.  DO NOT TAKE IN THE MORNING.   ___X_ Stop Anti-inflammatories AS OF NOW.  DO NOT TAKE AT ALL BEFORE SURGERY.  THIS INCLUDES IBUPROFEN / MOTRIN / ALEVE / ADVIL / NAPROSYN / GOODIES POWDERS  __X_ Stop supplements until after surgery. THIS INCLUDES FISH OIL AND OTHER SUPPLEMENTS   ____ Bring C-Pap to the hospital.   IF YOIU REMEMBER, PLEASE BRING YOUR MEDICAL ADVANCE DIRECTIVES WITH YOU SO WE CAN MAKE A COPY FOR YOUR RECORD.  TONIGHT, TAKE :         TENEX        CRESTOR        OVER THE COUNTER REFLUX MEDICINE

## 2017-12-30 ENCOUNTER — Ambulatory Visit
Admission: RE | Admit: 2017-12-30 | Discharge: 2017-12-30 | Disposition: A | Discharge status: Home / Self Care | Payer: Medicare Other | Source: Ambulatory Visit | Attending: Unknown Physician Specialty | Admitting: Unknown Physician Specialty

## 2017-12-30 ENCOUNTER — Encounter
Admission: RE | Disposition: A | Discharge status: Home / Self Care | Payer: Self-pay | Source: Ambulatory Visit | Attending: Unknown Physician Specialty

## 2017-12-30 ENCOUNTER — Ambulatory Visit: Payer: Medicare Other | Admitting: Anesthesiology

## 2017-12-30 DIAGNOSIS — I129 Hypertensive chronic kidney disease with stage 1 through stage 4 chronic kidney disease, or unspecified chronic kidney disease: Secondary | ICD-10-CM | POA: Insufficient documentation

## 2017-12-30 DIAGNOSIS — E1122 Type 2 diabetes mellitus with diabetic chronic kidney disease: Secondary | ICD-10-CM | POA: Insufficient documentation

## 2017-12-30 DIAGNOSIS — K219 Gastro-esophageal reflux disease without esophagitis: Secondary | ICD-10-CM | POA: Insufficient documentation

## 2017-12-30 DIAGNOSIS — Z794 Long term (current) use of insulin: Secondary | ICD-10-CM | POA: Insufficient documentation

## 2017-12-30 DIAGNOSIS — M65811 Other synovitis and tenosynovitis, right shoulder: Secondary | ICD-10-CM | POA: Insufficient documentation

## 2017-12-30 DIAGNOSIS — M7521 Bicipital tendinitis, right shoulder: Secondary | ICD-10-CM | POA: Insufficient documentation

## 2017-12-30 DIAGNOSIS — I6523 Occlusion and stenosis of bilateral carotid arteries: Secondary | ICD-10-CM | POA: Insufficient documentation

## 2017-12-30 DIAGNOSIS — Z79899 Other long term (current) drug therapy: Secondary | ICD-10-CM | POA: Insufficient documentation

## 2017-12-30 DIAGNOSIS — M75111 Incomplete rotator cuff tear or rupture of right shoulder, not specified as traumatic: Secondary | ICD-10-CM | POA: Insufficient documentation

## 2017-12-30 DIAGNOSIS — Z881 Allergy status to other antibiotic agents status: Secondary | ICD-10-CM | POA: Insufficient documentation

## 2017-12-30 DIAGNOSIS — N189 Chronic kidney disease, unspecified: Secondary | ICD-10-CM | POA: Insufficient documentation

## 2017-12-30 DIAGNOSIS — E1151 Type 2 diabetes mellitus with diabetic peripheral angiopathy without gangrene: Secondary | ICD-10-CM | POA: Insufficient documentation

## 2017-12-30 DIAGNOSIS — I252 Old myocardial infarction: Secondary | ICD-10-CM | POA: Insufficient documentation

## 2017-12-30 DIAGNOSIS — Z888 Allergy status to other drugs, medicaments and biological substances status: Secondary | ICD-10-CM | POA: Insufficient documentation

## 2017-12-30 DIAGNOSIS — E785 Hyperlipidemia, unspecified: Secondary | ICD-10-CM | POA: Insufficient documentation

## 2017-12-30 DIAGNOSIS — Z955 Presence of coronary angioplasty implant and graft: Secondary | ICD-10-CM | POA: Insufficient documentation

## 2017-12-30 DIAGNOSIS — I2510 Atherosclerotic heart disease of native coronary artery without angina pectoris: Secondary | ICD-10-CM | POA: Insufficient documentation

## 2017-12-30 HISTORY — PX: SHOULDER ARTHROSCOPY WITH ROTATOR CUFF REPAIR AND SUBACROMIAL DECOMPRESSION: SHX5686

## 2017-12-30 LAB — GLUCOSE, CAPILLARY
Glucose-Capillary: 165 mg/dL — ABNORMAL HIGH (ref 65–99)
Glucose-Capillary: 204 mg/dL — ABNORMAL HIGH (ref 65–99)

## 2017-12-30 SURGERY — SHOULDER ARTHROSCOPY WITH ROTATOR CUFF REPAIR AND SUBACROMIAL DECOMPRESSION
Anesthesia: General | Site: Shoulder | Laterality: Right | Wound class: Clean

## 2017-12-30 MED ORDER — ROPIVACAINE HCL 5 MG/ML IJ SOLN
INTRAMUSCULAR | Status: AC
  Filled 2017-12-30: qty 30

## 2017-12-30 MED ORDER — ONDANSETRON HCL 4 MG/2ML IJ SOLN
INTRAMUSCULAR | Status: DC | PRN
  Administered 2017-12-30: 4 mg via INTRAVENOUS

## 2017-12-30 MED ORDER — EPINEPHRINE PF 1 MG/ML IJ SOLN
INTRAMUSCULAR | Status: DC | PRN
  Administered 2017-12-30: 2 mL

## 2017-12-30 MED ORDER — NORCO 5-325 MG PO TABS: 1.0000 | ORAL_TABLET | ORAL | 0 refills | Status: DC | PRN

## 2017-12-30 MED ORDER — SUGAMMADEX SODIUM 200 MG/2ML IV SOLN
INTRAVENOUS | Status: DC | PRN
  Administered 2017-12-30: 140 mg via INTRAVENOUS

## 2017-12-30 MED ORDER — FENTANYL CITRATE (PF) 100 MCG/2ML IJ SOLN
50.0000 ug | Freq: Once | INTRAMUSCULAR | Status: AC
  Administered 2017-12-30: 50 ug via INTRAVENOUS

## 2017-12-30 MED ORDER — EPHEDRINE SULFATE 50 MG/ML IJ SOLN
INTRAMUSCULAR | Status: DC | PRN
  Administered 2017-12-30 (×2): 10 mg via INTRAVENOUS

## 2017-12-30 MED ORDER — SUGAMMADEX SODIUM 200 MG/2ML IV SOLN
INTRAVENOUS | Status: AC
  Filled 2017-12-30: qty 2

## 2017-12-30 MED ORDER — LIDOCAINE HCL (CARDIAC) 20 MG/ML IV SOLN
INTRAVENOUS | Status: DC | PRN
  Administered 2017-12-30: 100 mg via INTRAVENOUS

## 2017-12-30 MED ORDER — CEFAZOLIN SODIUM 1 G IJ SOLR
INTRAMUSCULAR | Status: AC
  Filled 2017-12-30: qty 20

## 2017-12-30 MED ORDER — PROPOFOL 10 MG/ML IV BOLUS
INTRAVENOUS | Status: DC | PRN
  Administered 2017-12-30: 150 mg via INTRAVENOUS

## 2017-12-30 MED ORDER — ONDANSETRON HCL 4 MG/2ML IJ SOLN
INTRAMUSCULAR | Status: AC
  Filled 2017-12-30: qty 2

## 2017-12-30 MED ORDER — FENTANYL CITRATE (PF) 100 MCG/2ML IJ SOLN
INTRAMUSCULAR | Status: AC
  Filled 2017-12-30: qty 2

## 2017-12-30 MED ORDER — EPHEDRINE SULFATE 50 MG/ML IJ SOLN
INTRAMUSCULAR | Status: AC
  Filled 2017-12-30: qty 1

## 2017-12-30 MED ORDER — PROPOFOL 10 MG/ML IV BOLUS
INTRAVENOUS | Status: AC
  Filled 2017-12-30: qty 20

## 2017-12-30 MED ORDER — MIDAZOLAM HCL 2 MG/2ML IJ SOLN
INTRAMUSCULAR | Status: AC
  Administered 2017-12-30: 1 mg via INTRAVENOUS
  Filled 2017-12-30: qty 2

## 2017-12-30 MED ORDER — LIDOCAINE HCL (PF) 2 % IJ SOLN
INTRAMUSCULAR | Status: AC
  Filled 2017-12-30: qty 10

## 2017-12-30 MED ORDER — ROCURONIUM BROMIDE 100 MG/10ML IV SOLN
INTRAVENOUS | Status: DC | PRN
  Administered 2017-12-30: 30 mg via INTRAVENOUS
  Administered 2017-12-30: 20 mg via INTRAVENOUS
  Administered 2017-12-30: 50 mg via INTRAVENOUS

## 2017-12-30 MED ORDER — CEFAZOLIN SODIUM-DEXTROSE 2-3 GM-%(50ML) IV SOLR
INTRAVENOUS | Status: DC | PRN
  Administered 2017-12-30: 2 g via INTRAVENOUS

## 2017-12-30 MED ORDER — ROCURONIUM BROMIDE 50 MG/5ML IV SOLN
INTRAVENOUS | Status: AC
  Filled 2017-12-30: qty 1

## 2017-12-30 MED ORDER — FENTANYL CITRATE (PF) 100 MCG/2ML IJ SOLN
INTRAMUSCULAR | Status: DC | PRN
  Administered 2017-12-30 (×2): 50 ug via INTRAVENOUS

## 2017-12-30 MED ORDER — EPINEPHRINE PF 1 MG/ML IJ SOLN
INTRAMUSCULAR | Status: AC
  Filled 2017-12-30: qty 1

## 2017-12-30 MED ORDER — FENTANYL CITRATE (PF) 100 MCG/2ML IJ SOLN
INTRAMUSCULAR | Status: AC
  Administered 2017-12-30: 50 ug via INTRAVENOUS
  Filled 2017-12-30: qty 2

## 2017-12-30 MED ORDER — SODIUM CHLORIDE 0.9 % IV SOLN
INTRAVENOUS | Status: DC
  Administered 2017-12-30 (×2): via INTRAVENOUS

## 2017-12-30 MED ORDER — FENTANYL CITRATE (PF) 100 MCG/2ML IJ SOLN: 25.0000 ug | INTRAMUSCULAR | Status: DC | PRN

## 2017-12-30 MED ORDER — EPINEPHRINE 30 MG/30ML IJ SOLN
INTRAMUSCULAR | Status: AC
  Filled 2017-12-30: qty 1

## 2017-12-30 MED ORDER — ONDANSETRON HCL 4 MG/2ML IJ SOLN: 4.0000 mg | Freq: Once | INTRAMUSCULAR | Status: DC | PRN

## 2017-12-30 MED ORDER — MIDAZOLAM HCL 2 MG/2ML IJ SOLN
1.0000 mg | Freq: Once | INTRAMUSCULAR | Status: AC
  Administered 2017-12-30: 1 mg via INTRAVENOUS

## 2017-12-30 MED ORDER — LIDOCAINE HCL (PF) 1 % IJ SOLN
INTRAMUSCULAR | Status: AC
  Filled 2017-12-30: qty 5

## 2017-12-30 SURGICAL SUPPLY — 76 items
ADAPTER IRRIG TUBE 2 SPIKE SOL (ADAPTER) IMPLANT
ANCHOR ALL-SUT Q-FIX 2.8 (Anchor) ×4 IMPLANT
ANCHOR SUT 5.5 SPEEDSCREW (Screw) ×4 IMPLANT
ARTHROWAND PARAGON T2 (SURGICAL WAND)
BLADE ABRADER 4.5 (BLADE) ×2 IMPLANT
BLADE CLIPPER SURG (BLADE) ×2 IMPLANT
BLADE SHAVER 4.5X7 STR FR (MISCELLANEOUS) ×2 IMPLANT
BLADE SURG 15 STRL LF DISP TIS (BLADE) IMPLANT
BLADE SURG 15 STRL SS (BLADE)
BUR ABRADER 5.5 BLK (MISCELLANEOUS) ×1 IMPLANT
BUR BR 5.5 WIDE MOUTH (BURR) ×2 IMPLANT
BURR ABRADER 5.5 BLK (MISCELLANEOUS) ×2
CANNULA 8.5X75 THRED (CANNULA) IMPLANT
CANNULA SHAVER 8MMX76MM (CANNULA) IMPLANT
CAP LOCK ULTRA CANNULA (MISCELLANEOUS) IMPLANT
CHLORAPREP W/TINT 26ML (MISCELLANEOUS) ×4 IMPLANT
DRAPE STERI 35X30 U-POUCH (DRAPES) ×2 IMPLANT
ELECT REM PT RETURN 9FT ADLT (ELECTROSURGICAL) ×2
ELECTRODE REM PT RTRN 9FT ADLT (ELECTROSURGICAL) ×1 IMPLANT
GAUZE PETRO XEROFOAM 1X8 (MISCELLANEOUS) ×2 IMPLANT
GAUZE SPONGE 4X4 12PLY STRL (GAUZE/BANDAGES/DRESSINGS) ×2 IMPLANT
GLOVE BIO SURGEON STRL SZ8 (GLOVE) ×4 IMPLANT
GLOVE BIOGEL M STRL SZ7.5 (GLOVE) ×4 IMPLANT
GLOVE BIOGEL PI IND STRL 7.0 (GLOVE) ×4 IMPLANT
GLOVE BIOGEL PI INDICATOR 7.0 (GLOVE) ×4
GLOVE INDICATOR 8.0 STRL GRN (GLOVE) ×2 IMPLANT
GOWN STRL REUS W/ TWL LRG LVL3 (GOWN DISPOSABLE) ×3 IMPLANT
GOWN STRL REUS W/TWL LRG LVL3 (GOWN DISPOSABLE) ×3
GOWN STRL REUS W/TWL LRG LVL4 (GOWN DISPOSABLE) ×2 IMPLANT
IV LACTATED RINGER IRRG 3000ML (IV SOLUTION) ×2
IV LR IRRIG 3000ML ARTHROMATIC (IV SOLUTION) ×2 IMPLANT
KIT SHOULDER TRACTION (DRAPES) ×2 IMPLANT
KIT SUTURE 2.8 Q-FIX DISP (MISCELLANEOUS) ×2 IMPLANT
MANIFOLD 4PT FOR NEPTUNE1 (MISCELLANEOUS) ×2 IMPLANT
NDL SAFETY ECLIPSE 18X1.5 (NEEDLE) IMPLANT
NEEDLE HYPO 18GX1.5 SHARP (NEEDLE)
NEEDLE MAYO CATGUT SZ 1.5 (NEEDLE)
NEEDLE MAYO CATGUT SZ 2 (NEEDLE) IMPLANT
NEEDLE SPNL 18GX3.5 QUINCKE PK (NEEDLE) IMPLANT
PACK ARTHROSCOPY SHOULDER (MISCELLANEOUS) ×2 IMPLANT
PASSER SUT CAPTURE FIRST (SUTURE) ×2 IMPLANT
SET TUBE SUCT SHAVER OUTFL 24K (TUBING) ×2 IMPLANT
SLING ULTRA II M (MISCELLANEOUS) ×2 IMPLANT
SOL PREP PVP 2OZ (MISCELLANEOUS) ×2
SOLUTION PREP PVP 2OZ (MISCELLANEOUS) ×1 IMPLANT
STAPLER SKIN PROX 35W (STAPLE) ×2 IMPLANT
SUT ETHIBOND 0 (SUTURE) ×4 IMPLANT
SUT ETHIBOND NAB CT1 #1 30IN (SUTURE) ×2 IMPLANT
SUT ETHILON 3-0 FS-10 30 BLK (SUTURE) ×2
SUT PDS AB 1 CT1 27 (SUTURE) IMPLANT
SUT PROLENE 2 0 CT2 30 (SUTURE) IMPLANT
SUT SMART STITCH CARTRIDGE (SUTURE) IMPLANT
SUT TICRON 2-0 30IN 311381 (SUTURE) IMPLANT
SUT VIC AB 0 CT1 36 (SUTURE) ×2 IMPLANT
SUT VIC AB 0 CT2 27 (SUTURE) IMPLANT
SUT VIC AB 2-0 CT1 27 (SUTURE)
SUT VIC AB 2-0 CT1 TAPERPNT 27 (SUTURE) IMPLANT
SUT VIC AB 2-0 CT2 27 (SUTURE) IMPLANT
SUT VIC AB 2-0 SH 27 (SUTURE) ×1
SUT VIC AB 2-0 SH 27XBRD (SUTURE) ×1 IMPLANT
SUT VIC AB 3-0 SH 27 (SUTURE)
SUT VIC AB 3-0 SH 27X BRD (SUTURE) IMPLANT
SUTURE EHLN 3-0 FS-10 30 BLK (SUTURE) ×1 IMPLANT
SUTURE MAGNUM WIRE 2X48 BLK (SUTURE) ×4 IMPLANT
SYR 10ML LL (SYRINGE) IMPLANT
TAPE MICROFOAM 4IN (TAPE) ×2 IMPLANT
TUBING ARTHRO INFLOW-ONLY STRL (TUBING) ×2 IMPLANT
WAND 30 DEG SABER W/CORD (SURGICAL WAND) IMPLANT
WAND ARTHRO PARAGON T2 (SURGICAL WAND) IMPLANT
WAND COVAC 50 IFS (MISCELLANEOUS) IMPLANT
WAND COVATOR 20 (MISCELLANEOUS) IMPLANT
WAND HAND CNTRL MULTIVAC 50 (MISCELLANEOUS) IMPLANT
WAND HAND CNTRL MULTIVAC 90 (MISCELLANEOUS) ×2 IMPLANT
WAND TENDON TOPAZ 0 ANGL (MISCELLANEOUS) IMPLANT
WAND TOPAZ EPF  WAS Q (MISCELLANEOUS)
WAND TOPAZ EPF WAS Q (MISCELLANEOUS) IMPLANT

## 2017-12-30 NOTE — Anesthesia Post-op Follow-up Note (Signed)
Anesthesia QCDR form completed.        

## 2017-12-30 NOTE — Progress Notes (Signed)
Ice pack to right

## 2017-12-30 NOTE — Discharge Instructions (Addendum)
Use shoulder immobilizer at all times  Keep dressing dry  Leave dressing in place until first postoperative visit  Return to the clinic about 1 week post surgery  Take 81 mg aspirin or Bufferin tablet twice a day for 2 weeks post surgery  Can sleep with multiple pillows behind the back or in a recliner  Use TENS unit if prescribed  Take pain medication prior to going to sleep the evening of your surgery  Ice pack prn  Activity as toleratedInterscalene Nerve Block (ISNB) Discharge Instructions   1.  For your surgery you have received an Interscalene Nerve Block. 2. Nerve Blocks affect many types of nerves, including nerves that control movement, pain and normal sensation.  You may experience feelings such as numbness, tingling, heaviness, weakness or the inability to move your arm or the feeling or sensation that your arm has "fallen asleep". 3. A nerve block can last for 2 - 36 hours or more depending on the medication used.  Usually the weakness wears off first.  The tingling and heaviness usually wear off next.  Finally you may start to notice pain.  Keep in mind that this may occur in any order.  once a nerve block starts to wear off it is usually completely gone within 60 minutes. 4. ISNB may cause mild shortness of breath, a hoarse voice, blurry vision, unequal pupils, or drooping of the face on the same side as the nerve block.  These symptoms will usually go away within 12 hours.  Very rarely the procedure itself can cause mild seizures. 5. If needed, your surgeon will give you a prescription for pain medication.  It will take about 60 minutes for the oral pain medication to become fully effective.  So, it is recommended that you start taking this medication before the nerve block first begins to wear off, or when you first begin to feel discomfort. 6. Keep in mind that nerve blocks often wear off in the middle of the night.   If you are going to bed and the block has not started to  wear off or you have not started to have any discomfort, consider setting an alarm for 2 to 3 hours, so you can assess your block.  If you notice the block is wearing off or you are starting to have discomfort, you can take your pain medication. 7. Take your pain medication only as prescribed.  Pain medication can cause sedation and decrease your breathing if you take more than you need for the level of pain that you have. 8. Nausea is a common side effect of many pain medications.  You may want to eat something before taking your pain medicine to prevent nausea. 9. After an Interscalene nerve block, you cannot feel pain, pressure or extremes in temperature in the effected arm.  Because your arm is numb it is at an increased risk for injury.  To decrease the possibility of injury, please practice the following:  a. While you are awake change the position of your arm frequently to prevent too much pressure on any one area for prolonged periods of time. b.  If you have a cast or tight dressing, check the color or your fingers every couple of hours.  Call your surgeon with the appearance of any discoloration (white or blue). c. If you are given a sling to wear before you go home, please wear it  at all times until the block has completely worn off.  Do not get  up at night without your sling. d. If you experience any problems or concerns, please contact your surgeon's office. e. If you experience severe or prolonged shortness of breath go to the nearest emergency department.     AMBULATORY SURGERY  DISCHARGE INSTRUCTIONS   1) The drugs that you were given will stay in your system until tomorrow so for the next 24 hours you should not:  A) Drive an automobile B) Make any legal decisions C) Drink any alcoholic beverage   2) You may resume regular meals tomorrow.  Today it is better to start with liquids and gradually work up to solid foods.  You may eat anything you prefer, but it is better to  start with liquids, then soup and crackers, and gradually work up to solid foods.   3) Please notify your doctor immediately if you have any unusual bleeding, trouble breathing, redness and pain at the surgery site, drainage, fever, or pain not relieved by medication.    4) Additional Instructions:        Please contact your physician with any problems or Same Day Surgery at [phone], Monday through Friday 6 am to 4 pm, or [place] at [facility] number at [phone].

## 2017-12-30 NOTE — Op Note (Signed)
12/30/2017  3:07 PM  Patient:   David Henry  Pre-Op Diagnosis:  Torn rotator cuff with impingement and long head of the biceps tendinitis  Postoperative diagnosis: Torn rotator cuff with impingement and long head of the biceps tendinitis   Procedure: Arthroscopic release of the long head of the biceps tendon plus limited synovectomy of the glenohumeral joint followed by arthroscopic subacromial decompression and mini incision rotator cuff repair  Anesthesia: General endotracheal with interscalene block placed preoperatively by the anesthesiologist.  Findings: As above.   Complications: None  Estimated blood loss:100 cc  Tourniquet time: None  Drains: None   Brief clinical note:  The patient's symptoms have progressed despite medications, activity modification, etc. The patient's history and examination are consistent with right rotator cuff tear with mpingement. These findings were confirmed by MRI scan. The patient presents at this time for definitive management of these shoulder symptoms.  Procedure: The patient had an interscalene block on the right shoulder in the preop area. The patient was then brought into the operating room and placed in the supine position. The patient then underwent  endotracheal intubation and general anesthesia before being repositioned in the lateral decubitus position. Theight shoulder was prepped and draped in usual fashion for shoulder procedure. The shoulder was supported with the Acufex shoulder suspension device.  12 pounds of traction was utilized. Preoperative antibiotics were administered. A timeout was performed . A posterior portal was created and the glenohumeral joint thoroughly inspected revealing moderately extensive synovitis of the anterior aspect of the glenohumeral joint and synovitic infiltration of the long head of the biceps tendon attachment. An anterior portal was created. An ArthroCare wand was inserted and used  to obtain hemostasis as well as to perform a limited synovectomy.The biceps tendon was evaluated and then released from its labral attachment using an ArthroCare wand.  The scope was repositioned through the posterior portal into the subacromial space. A separate lateral portal was created using an outside-in technique. The rotator cuff was inspected revealing a moderate sized tear. An ArthroCare 90 wand followed by a 4.0 full-radius resector was introduced and used to perform a subtotal bursectomy. The ArthroCare wand was then inserted and used to remove the periosteal tissue off the undersurface of the anterior third of the acromion as well as to recess the coracoacromial ligament from its attachment along the anterior and lateral margins of the acromion.   With the scope in the lateral portal a 5.33mm acromionizing burr was introduced through the posterior portal and used to perform the decompression by removing the undersurface of the anterior third of the acromion. I also used the same burr to lightly decorticate the greater tuberosity in the intended area of the rotator rotator cuff reattachment. I also used a small punch to make vascular vent holes in the greater tuberosity.  An approximately 4 cm incision was made over the midlateral aspect of the shoulder.   This incision was carried down through the subcutaneous tissues onto the deltoid. The deltoid was divided in line with its fibers to provide access into the subacromial space. The rotator cuff tear was readily identified. The margins were debrided.   The tear was repaired using horizontal mattress sutures secured to two Q- Fix anchors. The horizontal mattress suture tails were then crisscrossed over to 2 laterally placed 5.5 speed screws. This gave me a 2 row repair of the torn rotator cuff. I reinforced the repair anteriorly with a 0 Ethibond suture. Thewound was irrigated with saline solution.The  deltoid was repaired to bone with #1 ethibond  sutures.  Deltoid interval was closed with 0- vicryl. The subcutaneous tissues were closed using 2-0 Vicryl interrupted sutures and the skin incision and portal sites were closed using staples.  A sterile bulky dressing was applied to the shoulder and then the right upper extremity was placed into a shoulder immobilizer. The patient was then awakened, extubated, and returned to the recovery room in satisfactory condition after tolerating the procedure well.  Blood loss was about 100 cc.Marland Kitchen

## 2017-12-30 NOTE — Transfer of Care (Signed)
Immediate Anesthesia Transfer of Care Note  Patient: David Henry  Procedure(s) Performed: SHOULDER ARTHROSCOPY WITH ROTATOR CUFF REPAIR AND SUBACROMIAL DECOMPRESSION (Right Shoulder)  Patient Location: PACU  Anesthesia Type:General  Level of Consciousness: drowsy and patient cooperative  Airway & Oxygen Therapy: Patient Spontanous Breathing and Patient connected to face mask oxygen  Post-op Assessment: Report given to RN and Post -op Vital signs reviewed and stable  Post vital signs: Reviewed and stable  Last Vitals:  Vitals:   12/30/17 1044 12/30/17 1058  BP: (!) 122/48 (!) 124/49  Pulse: 76 80  Resp: 16 16  Temp:    SpO2: 95% 97%    Last Pain:  Vitals:   12/30/17 1058  TempSrc:   PainSc: 0-No pain      Patients Stated Pain Goal: 2 (00/17/49 4496)  Complications: No apparent anesthesia complications

## 2017-12-30 NOTE — Anesthesia Preprocedure Evaluation (Addendum)
Anesthesia Evaluation  Patient identified by MRN, date of birth, ID band Patient awake    Reviewed: Allergy & Precautions, NPO status , Patient's Chart, lab work & pertinent test results  History of Anesthesia Complications Negative for: history of anesthetic complications  Airway Mallampati: III       Dental   Pulmonary neg sleep apnea, neg COPD,           Cardiovascular hypertension, Pt. on medications + CAD, + Past MI, + Cardiac Stents and + Peripheral Vascular Disease  (-) CHF (-) dysrhythmias (-) Valvular Problems/Murmurs     Neuro/Psych neg Seizures    GI/Hepatic Neg liver ROS, GERD  Medicated and Controlled,  Endo/Other  diabetes, Type 2, Oral Hypoglycemic Agents, Insulin DependentHypothyroidism   Renal/GU Renal InsufficiencyRenal disease     Musculoskeletal   Abdominal   Peds  Hematology   Anesthesia Other Findings   Reproductive/Obstetrics                            Anesthesia Physical Anesthesia Plan  ASA: III  Anesthesia Plan: General   Post-op Pain Management: GA combined w/ Regional for post-op pain   Induction: Intravenous  PONV Risk Score and Plan: 2 and Dexamethasone and Ondansetron  Airway Management Planned: Oral ETT  Additional Equipment:   Intra-op Plan:   Post-operative Plan:   Informed Consent: I have reviewed the patients History and Physical, chart, labs and discussed the procedure including the risks, benefits and alternatives for the proposed anesthesia with the patient or authorized representative who has indicated his/her understanding and acceptance.     Plan Discussed with:   Anesthesia Plan Comments:         Anesthesia Quick Evaluation

## 2017-12-30 NOTE — Progress Notes (Signed)
Can wiggle fingers on right     Skin warm and dry   Sling intact and small pillow for elevation

## 2017-12-30 NOTE — Anesthesia Procedure Notes (Signed)
Procedure Name: Intubation Date/Time: 12/30/2017 11:18 AM Performed by: Silvana Newness, CRNA Pre-anesthesia Checklist: Patient identified, Emergency Drugs available, Suction available, Patient being monitored and Timeout performed Patient Re-evaluated:Patient Re-evaluated prior to induction Oxygen Delivery Method: Circle system utilized Preoxygenation: Pre-oxygenation with 100% oxygen Induction Type: IV induction Ventilation: Mask ventilation without difficulty Laryngoscope Size: Mac and 4 Grade View: Grade II Tube type: Oral Tube size: 7.5 mm Number of attempts: 1 Airway Equipment and Method: Stylet Placement Confirmation: ETT inserted through vocal cords under direct vision,  positive ETCO2 and breath sounds checked- equal and bilateral Secured at: 22 cm Tube secured with: Tape Dental Injury: Teeth and Oropharynx as per pre-operative assessment

## 2017-12-30 NOTE — H&P (Signed)
  H and P reviewed. No changes. Uploaded at later date. 

## 2017-12-31 ENCOUNTER — Encounter: Payer: Self-pay | Admitting: Unknown Physician Specialty

## 2017-12-31 ENCOUNTER — Other Ambulatory Visit: Payer: Self-pay | Admitting: Family Medicine

## 2018-01-01 ENCOUNTER — Ambulatory Visit: Payer: Medicare Other | Admitting: Family Medicine

## 2018-01-03 NOTE — Anesthesia Postprocedure Evaluation (Signed)
Anesthesia Post Note  Patient: David Henry  Procedure(s) Performed: SHOULDER ARTHROSCOPY WITH ROTATOR CUFF REPAIR AND SUBACROMIAL DECOMPRESSION (Right Shoulder)  Patient location during evaluation: PACU Anesthesia Type: General Level of consciousness: awake and alert Pain management: pain level controlled Vital Signs Assessment: post-procedure vital signs reviewed and stable Respiratory status: spontaneous breathing, nonlabored ventilation, respiratory function stable and patient connected to nasal cannula oxygen Cardiovascular status: blood pressure returned to baseline and stable Postop Assessment: no apparent nausea or vomiting Anesthetic complications: no     Last Vitals:  Vitals:   12/30/17 1526 12/30/17 1606  BP: (!) 122/52 (!) 138/57  Pulse: 77 85  Resp: 14   Temp: 36.4 C   SpO2: 94% 95%    Last Pain:  Vitals:   12/31/17 1108  TempSrc:   PainSc: 8                  Molli Barrows

## 2018-01-12 ENCOUNTER — Telehealth: Payer: Self-pay | Admitting: Family Medicine

## 2018-01-12 NOTE — Telephone Encounter (Signed)
Wife notified of Dr. Marliss Coots comments and instructions. Wife said he isn't SOB or having any chest pain so appt scheduled tomorrow at 12:30pm, I did advise wife that if sxs worsen or if he does develop SOB or CP to go to ER asap

## 2018-01-12 NOTE — Telephone Encounter (Signed)
I need to see him for that  If sob/cp go to ED Thanks

## 2018-01-12 NOTE — Telephone Encounter (Signed)
Copied from [name]. Topic: Quick Communication - See Telephone Encounter >> Jan 12, 2018 11:59 AM Percell Belt A wrote: CRM for notification. See Telephone encounter for: pt had shoulder surgery 2 weeks ago and from sitting so long his feet and ankles has swollen.  The surgeon told him to call PCP to call in meds to help get rid of the fluid. Can this be done?    Best number [phone]   01/12/18.

## 2018-01-13 ENCOUNTER — Ambulatory Visit: Payer: Medicare Other | Admitting: Family Medicine

## 2018-01-13 ENCOUNTER — Encounter: Payer: Self-pay | Admitting: Family Medicine

## 2018-01-13 VITALS — BP 118/62 | HR 84 | Temp 97.4°F | Ht 67.0 in | Wt 165.5 lb

## 2018-01-13 DIAGNOSIS — N289 Disorder of kidney and ureter, unspecified: Secondary | ICD-10-CM | POA: Diagnosis not present

## 2018-01-13 DIAGNOSIS — E1122 Type 2 diabetes mellitus with diabetic chronic kidney disease: Secondary | ICD-10-CM

## 2018-01-13 DIAGNOSIS — N189 Chronic kidney disease, unspecified: Secondary | ICD-10-CM | POA: Diagnosis not present

## 2018-01-13 DIAGNOSIS — I10 Essential (primary) hypertension: Secondary | ICD-10-CM | POA: Diagnosis not present

## 2018-01-13 DIAGNOSIS — Z794 Long term (current) use of insulin: Secondary | ICD-10-CM | POA: Diagnosis not present

## 2018-01-13 DIAGNOSIS — N183 Chronic kidney disease, stage 3 unspecified: Secondary | ICD-10-CM

## 2018-01-13 DIAGNOSIS — R600 Localized edema: Secondary | ICD-10-CM | POA: Diagnosis not present

## 2018-01-13 MED ORDER — FUROSEMIDE 20 MG PO TABS: 20.0000 mg | ORAL_TABLET | Freq: Every day | ORAL | 0 refills | Status: DC | PRN

## 2018-01-13 NOTE — Assessment & Plan Note (Signed)
In pt with hx of renal insuff and on amlodipine  Post op shoulder surgery and sleeping in chair  Suspect this is the cause  No s/s of DVT whatsoever and nl exam  No cardiac flags either Trial of short course of furosemide 20 mg daily  Lab on Monday with update  Elevate feet  supp socks during the day if helpful Back in bed for sleep when able in recovery Avoid nsaids

## 2018-01-13 NOTE — Progress Notes (Signed)
Subjective:    Patient ID: David Henry, male    DOB: May 03, 1926, 84 y.o.   MRN: [id number]  HPI Here for LE edema worse since his shoulder surgery  Had "a tendon torn in 2", bone spurs -it was quite bad   He had rotator cuff repair on 12/30/17  Had severe pain the first night - that has improved  Getting by -now improving   Sleeping on the couch with his feet down on the floor or a foot stool  Had swelling from day one  Could not wear his bedroom shoes  Today it is improving a bit  He took some aleve for a short time -off of it now   Legs are not hurting  No red or warm areas  The same on both sides  No numbness-just feels tight   No cp or sob    Wt Readings from Last 3 Encounters:  01/13/18 165 lb 8 oz (75.1 kg)  12/29/17 150 lb (68 kg)  12/14/17 159 lb 8 oz (72.3 kg)  weight is up   25.92 kg/m   BP Readings from Last 3 Encounters:  01/13/18 118/62  12/30/17 (!) 138/57  12/29/17 138/63  takes amlodipine   Pulse Readings from Last 3 Encounters:  01/13/18 84  12/30/17 85  12/29/17 70   Pulse ox is 97% on RA today   Lab Results  Component Value Date   CREATININE 1.76 (H) 12/09/2017   BUN 27 (H) 12/09/2017   NA 143 12/09/2017   K 4.3 12/09/2017   CL 110 12/09/2017   CO2 28 12/09/2017   Lab Results  Component Value Date   ALT 21 12/09/2017   AST 20 12/09/2017   ALKPHOS 69 12/09/2017   BILITOT 0.7 12/09/2017   Lab Results  Component Value Date   WBC 7.7 12/09/2017   HGB 12.6 (L) 12/09/2017   HCT 37.9 (L) 12/09/2017   MCV 102.1 (H) 12/09/2017   PLT 242.0 12/09/2017    Lab Results  Component Value Date   HGBA1C 7.1 (H) 12/09/2017   Lab Results  Component Value Date   TSH 3.18 03/19/2017     Patient Active Problem List   Diagnosis Date Noted  . Pedal edema 01/13/2018  . Pre-op examination 12/14/2017  . Routine general medical examination at a health care facility 03/09/2016  . Constipation 09/17/2015  . Encounter for Medicare annual  wellness exam 12/08/2014  . Hematuria, microscopic 05/26/2013  . Renal insufficiency 05/24/2013  . Anemia 11/23/2012  . Hypothyroid 11/23/2012  . GERD 11/29/2010  . Coronary atherosclerosis 04/18/2007  . Controlled type 2 diabetes mellitus with chronic kidney disease ([place]) 04/15/2007  . Hyperlipidemia 04/15/2007  . Essential hypertension 04/15/2007  . MYOCARDIAL INFARCTION, HX OF 04/15/2007  . ACTINIC KERATOSIS 04/15/2007   Past Medical History:  Diagnosis Date  . Arthritis   . CAD (coronary artery disease)    3 stents  . Colon polyp   . Diabetes mellitus    type II  . Dyspnea    with exertion  . GERD (gastroesophageal reflux disease)   . Hyperlipidemia   . Hypertension   . Hypothyroidism   . Kidney stones    stage III CKD  . Neuropathy   . Peripheral vascular disease ([facility])    neuropathy in feet   Past Surgical History:  Procedure Laterality Date  . APPENDECTOMY  1984  . CATARACT EXTRACTION Bilateral 04/2001  . CHOLECYSTECTOMY  1989  . COLONOSCOPY N/A 2/14  hemorrhoids and tics (after pos ifob card)  . CORONARY ANGIOPLASTY  2006   3 stents  . ESOPHAGOGASTRODUODENOSCOPY N/A 2/14  . RETINAL DETACHMENT SURGERY Right 2003  . SHOULDER ARTHROSCOPY WITH ROTATOR CUFF REPAIR AND SUBACROMIAL DECOMPRESSION Right 12/30/2017   Procedure: SHOULDER ARTHROSCOPY WITH ROTATOR CUFF REPAIR AND SUBACROMIAL DECOMPRESSION;  Surgeon: Leanor Kail, MD;  Location: [facility] ORS;  Service: Orthopedics;  Laterality: Right;  . SHOULDER SURGERY  2012   Lt shoulder repair   Social History   Tobacco Use  . Smoking status: Never Smoker  . Smokeless tobacco: Never Used  Substance Use Topics  . Alcohol use: No    Alcohol/week: 0.0 oz  . Drug use: No   Family History  Problem Relation Age of Onset  . Cancer Brother        prostate CA  . Heart disease Mother   . Stroke Father   . Kidney disease Sister   . Kidney disease Sister   . Clotting disorder Sister   . COPD Brother   . Heart  disease Brother    Allergies  Allergen Reactions  . Adhesive [Tape] Dermatitis    Skin is thin so it is tough to take off.  Use paper tape  . Azithromycin Nausea Only    REACTION: nausea  . Celecoxib Other (See Comments)    Raises blood pressure  . Zocor [Simvastatin] Other (See Comments)    Muscle pain   Current Outpatient Medications on File Prior to Visit  Medication Sig Dispense Refill  . acetaminophen (TYLENOL ARTHRITIS PAIN) 650 MG CR tablet Take 650 mg by mouth every 8 (eight) hours as needed.      Marland Kitchen amLODipine (NORVASC) 5 MG tablet Take 5 mg by mouth daily.    Marland Kitchen aspirin 81 MG tablet Take 81 mg by mouth daily.      . BD PEN NEEDLE NANO U/F 32G X 4 MM MISC USE AS DIRECTED WITH LANTUS 100 each 2  . glimepiride (AMARYL) 4 MG tablet TAKE 1 TABLET BY MOUTH EVERY MORNING ANDTAKE 1 TABLET BY MOUTH EVERY EVENING 180 tablet 1  . glucose blood (ONE TOUCH ULTRA TEST) test strip CHECK BLOOD SUGAR TWICE DAILY AND AS DIRECTED FOR DM (DX. E11.9) 200 each 1  . guanFACINE (TENEX) 1 MG tablet TAKE 1 TABLET BY MOUTH ONCE DAILY AT BEDTIME 90 tablet 1  . Insulin Glargine (LANTUS SOLOSTAR) 100 UNIT/ML Solostar Pen INJECT 15 UNITS INTO THE SKIN EVERY MORNING 15 mL 5  . levothyroxine (SYNTHROID, LEVOTHROID) 25 MCG tablet TAKE 1 TABLET BY MOUTH ONCE A DAY BEFOREBREAKFAST 90 tablet 3  . Multiple Vitamin (MULTIVITAMIN) capsule Take 1 capsule by mouth daily.      . NON FORMULARY 100 BC ultra fine needles    . NORCO 5-325 MG tablet Take 1-2 tablets by mouth every 4 (four) hours as needed for moderate pain. MAXIMUM TOTAL ACETAMINOPHEN DOSE IS 4000 MG PER DAY 25 tablet 0  . Omega-3 Fatty Acids (FISH OIL PO) Take 2 capsules by mouth daily.      . rosuvastatin (CRESTOR) 5 MG tablet Take 1 tablet (5 mg total) by mouth every other day. 45 tablet 3  . senna (SENOKOT) 8.6 MG tablet Take 1 tablet by mouth 2 (two) times daily.    . sitaGLIPtin (JANUVIA) 50 MG tablet Take 1 tablet (50 mg total) by mouth daily. 90  tablet 3  . tamsulosin (FLOMAX) 0.4 MG CAPS capsule Take 1 capsule (0.4 mg total) by mouth daily. [place]  capsule 3  . traMADol (ULTRAM) 50 MG tablet Take 50 mg by mouth every 6 (six) hours as needed for severe pain (shoulder pain).    . [DISCONTINUED] omeprazole (PRILOSEC OTC) 20 MG tablet Take 1 tablet (20 mg total) by mouth daily. 90 tablet 1   No current facility-administered medications on file prior to visit.     Review of Systems  Constitutional: Negative for activity change, appetite change, fatigue, fever and unexpected weight change.  HENT: Negative for congestion, rhinorrhea, sore throat and trouble swallowing.   Eyes: Negative for pain, redness, itching and visual disturbance.  Respiratory: Negative for cough, chest tightness, shortness of breath and wheezing.   Cardiovascular: Positive for leg swelling. Negative for chest pain and palpitations.  Gastrointestinal: Negative for abdominal pain, blood in stool, constipation, diarrhea and nausea.  Endocrine: Negative for cold intolerance, heat intolerance, polydipsia and polyuria.  Genitourinary: Negative for difficulty urinating, dysuria, frequency and urgency.  Musculoskeletal: Positive for arthralgias. Negative for joint swelling and myalgias.       R shoulder pain post op   Skin: Negative for pallor and rash.  Neurological: Negative for dizziness, tremors, weakness, numbness and headaches.  Hematological: Negative for adenopathy. Does not bruise/bleed easily.  Psychiatric/Behavioral: Negative for decreased concentration and dysphoric mood. The patient is not nervous/anxious.        Objective:   Physical Exam  Constitutional: He appears well-developed and well-nourished. No distress.  Well appearing  In R sling s/p shoulder surgery  HENT:  Head: Normocephalic and atraumatic.  Mouth/Throat: Oropharynx is clear and moist.  Eyes: Conjunctivae and EOM are normal. Pupils are equal, round, and reactive to light.  Neck: Normal range  of motion. Neck supple. No JVD present. Carotid bruit is not present. No thyromegaly present.  Cardiovascular: Normal rate, regular rhythm, normal heart sounds and intact distal pulses. Exam reveals no gallop.  Pulmonary/Chest: Effort normal and breath sounds normal. No respiratory distress. He has no wheezes. He has no rales.  No crackles  Abdominal: Soft. Bowel sounds are normal. He exhibits no distension, no abdominal bruit and no mass. There is no tenderness.  Musculoskeletal: He exhibits edema. He exhibits no tenderness.  1 plus LE edema to mid shin with pitting  Symmetric  No erythema/ warmth or tenderness at all  Baseline perfusion     Lymphadenopathy:    He has no cervical adenopathy.  Neurological: He is alert. He has normal reflexes. No cranial nerve deficit. He exhibits normal muscle tone. Coordination normal.  Skin: Skin is warm and dry. No rash noted. No pallor.  Psychiatric: He has a normal mood and affect.          Assessment & Plan:   Problem List Items Addressed This Visit      Cardiovascular and Mediastinum   Essential hypertension    bp in fair control at this time  BP Readings from Last 1 Encounters:  01/13/18 118/62   No changes needed Disc lifstyle change with low sodium diet and exercise  Last labs rev  Exp pedal edema from sitting after surgery  Will try short course of furosemide       Relevant Medications   furosemide (LASIX) 20 MG tablet     Endocrine   Controlled type 2 diabetes mellitus with chronic kidney disease ([place])    Lab Results  Component Value Date   HGBA1C 7.1 (H) 12/09/2017   Do expect glucose to be up post op/with less exercise also Continue to follow  Genitourinary   Renal insufficiency    Some pedal edema post op (not other areas) Short course of furosemide  Renal labs incl K on Monday planned        Other   Pedal edema - Primary    In pt with hx of renal insuff and on amlodipine  Post op shoulder  surgery and sleeping in chair  Suspect this is the cause  No s/s of DVT whatsoever and nl exam  No cardiac flags either Trial of short course of furosemide 20 mg daily  Lab on Monday with update  Elevate feet  supp socks during the day if helpful Back in bed for sleep when able in recovery Avoid nsaids

## 2018-01-13 NOTE — Assessment & Plan Note (Signed)
bp in fair control at this time  BP Readings from Last 1 Encounters:  01/13/18 118/62   No changes needed Disc lifstyle change with low sodium diet and exercise  Last labs rev  Exp pedal edema from sitting after surgery  Will try short course of furosemide

## 2018-01-13 NOTE — Assessment & Plan Note (Signed)
Some pedal edema post op (not other areas) Short course of furosemide  Renal labs incl K on Monday planned

## 2018-01-13 NOTE — Assessment & Plan Note (Signed)
Lab Results  Component Value Date   HGBA1C 7.1 (H) 12/09/2017   Do expect glucose to be up post op/with less exercise also Continue to follow

## 2018-01-13 NOTE — Patient Instructions (Addendum)
The higher you can elevate your legs -the better  Get back in bed when you can   Go ahead and take the furosemide (lasix) daily  You can stop it at any time if swelling is much better or you feel too dry We will check labs on Monday   Walk whenever you can comfortably  Elevate feet whenever you sit-as high as you can  I expect when you can start sleeping in bed this will improve   If you develop any redness or pain in either leg- alert [place] (this could be a sign of a blood clot)   The staff up front will set up a lab draw for Monday

## 2018-01-18 ENCOUNTER — Other Ambulatory Visit (INDEPENDENT_AMBULATORY_CARE_PROVIDER_SITE_OTHER): Payer: Medicare Other

## 2018-01-18 DIAGNOSIS — N289 Disorder of kidney and ureter, unspecified: Secondary | ICD-10-CM | POA: Diagnosis not present

## 2018-01-18 DIAGNOSIS — R600 Localized edema: Secondary | ICD-10-CM

## 2018-01-18 LAB — RENAL FUNCTION PANEL
Albumin: 3.7 g/dL (ref 3.5–5.2)
BUN: 30 mg/dL — AB (ref 6–23)
CALCIUM: 9.3 mg/dL (ref 8.4–10.5)
CHLORIDE: 101 meq/L (ref 96–112)
CO2: 31 meq/L (ref 19–32)
CREATININE: 1.8 mg/dL — AB (ref 0.40–1.50)
GFR: 37.9 mL/min — ABNORMAL LOW (ref >60.00)
Glucose, Bld: 277 mg/dL — ABNORMAL HIGH (ref 70–99)
Phosphorus: 3.6 mg/dL (ref 2.3–4.6)
Potassium: 4.5 mEq/L (ref 3.5–5.1)
Sodium: 139 mEq/L (ref 135–145)

## 2018-01-20 ENCOUNTER — Encounter: Payer: Self-pay | Admitting: *Deleted

## 2018-02-22 ENCOUNTER — Other Ambulatory Visit: Payer: Self-pay | Admitting: Family Medicine

## 2018-03-04 ENCOUNTER — Telehealth: Payer: Self-pay | Admitting: Family Medicine

## 2018-03-04 ENCOUNTER — Telehealth: Payer: Self-pay | Admitting: *Deleted

## 2018-03-04 NOTE — Telephone Encounter (Signed)
Copied from [place]. Topic: Quick Communication - See Telephone Encounter >> Mar 04, 2018  1:54 PM Vernona Rieger wrote: CRM for notification. See Telephone encounter for:   03/04/18.  [facility] needs a PA on guanFACINE (TENEX) 1 MG tablet with additional info. She will be faxing so please fax back before tomorrow [phone] Call back is [phone] if you dont receive

## 2018-03-04 NOTE — Telephone Encounter (Signed)
PA done at [email or web address] for pt's guanfacine, I will await a response

## 2018-03-05 NOTE — Telephone Encounter (Signed)
See request °

## 2018-03-05 NOTE — Telephone Encounter (Signed)
I received additional info form but I couldn't get it approved because it says pt has to have tried and failed 4 out of the list of the formulary alt meds. Letter placed in Dr. Marliss Coots inbox for review and to see if she wants to prescribe one of the formulary alt meds

## 2018-03-08 MED ORDER — LISINOPRIL 10 MG PO TABS: 10.0000 mg | ORAL_TABLET | Freq: Every day | ORAL | 1 refills | Status: DC

## 2018-03-08 NOTE — Telephone Encounter (Signed)
I looked at the list and then rev last cardiology and renal notes- the last renal note did mention that we could add low dose ace if needed  I like lisinopril (let me know if any coverage problems) When he finishes guanfacine please start lisinopril 10 mg 1 po qd #30 5 ref - if any problems let me know (low bp/ side effects)  We will check bp and labs at next visit and he had a cardiology visit in May as well  Thanks

## 2018-03-08 NOTE — Telephone Encounter (Signed)
Pt notified of PA denial and that Dr. Glori Bickers is sending in lisinopril. Pt is okay with starting new med. I advise him of Dr. Marliss Coots comments and instructions and her verbalized understanding

## 2018-03-09 ENCOUNTER — Other Ambulatory Visit (INDEPENDENT_AMBULATORY_CARE_PROVIDER_SITE_OTHER): Payer: Medicare Other

## 2018-03-09 DIAGNOSIS — N289 Disorder of kidney and ureter, unspecified: Secondary | ICD-10-CM | POA: Diagnosis not present

## 2018-03-09 DIAGNOSIS — N183 Chronic kidney disease, stage 3 (moderate): Secondary | ICD-10-CM | POA: Diagnosis not present

## 2018-03-09 DIAGNOSIS — I10 Essential (primary) hypertension: Secondary | ICD-10-CM | POA: Diagnosis not present

## 2018-03-09 DIAGNOSIS — E1122 Type 2 diabetes mellitus with diabetic chronic kidney disease: Secondary | ICD-10-CM | POA: Diagnosis not present

## 2018-03-09 DIAGNOSIS — Z794 Long term (current) use of insulin: Secondary | ICD-10-CM

## 2018-03-09 LAB — BASIC METABOLIC PANEL
BUN: 29 mg/dL — AB (ref 6–23)
CHLORIDE: 107 meq/L (ref 96–112)
CO2: 27 meq/L (ref 19–32)
CREATININE: 1.7 mg/dL — AB (ref 0.40–1.50)
Calcium: 9.8 mg/dL (ref 8.4–10.5)
GFR: 40.47 mL/min — ABNORMAL LOW (ref >60.00)
GLUCOSE: 155 mg/dL — AB (ref 70–99)
POTASSIUM: 4.6 meq/L (ref 3.5–5.1)
Sodium: 141 mEq/L (ref 135–145)

## 2018-03-09 LAB — HEMOGLOBIN A1C: Hgb A1c MFr Bld: 8 % — ABNORMAL HIGH (ref 4.6–6.5)

## 2018-03-10 ENCOUNTER — Other Ambulatory Visit: Payer: Medicare Other

## 2018-03-15 ENCOUNTER — Encounter: Payer: Self-pay | Admitting: Family Medicine

## 2018-03-15 ENCOUNTER — Ambulatory Visit: Payer: Medicare Other | Admitting: Family Medicine

## 2018-03-15 VITALS — BP 124/66 | HR 67 | Temp 97.5°F | Ht 67.0 in | Wt 160.2 lb

## 2018-03-15 DIAGNOSIS — Z794 Long term (current) use of insulin: Secondary | ICD-10-CM

## 2018-03-15 DIAGNOSIS — E7800 Pure hypercholesterolemia, unspecified: Secondary | ICD-10-CM

## 2018-03-15 DIAGNOSIS — E039 Hypothyroidism, unspecified: Secondary | ICD-10-CM | POA: Diagnosis not present

## 2018-03-15 DIAGNOSIS — N289 Disorder of kidney and ureter, unspecified: Secondary | ICD-10-CM | POA: Diagnosis not present

## 2018-03-15 DIAGNOSIS — I10 Essential (primary) hypertension: Secondary | ICD-10-CM

## 2018-03-15 DIAGNOSIS — E1122 Type 2 diabetes mellitus with diabetic chronic kidney disease: Secondary | ICD-10-CM

## 2018-03-15 DIAGNOSIS — N189 Chronic kidney disease, unspecified: Secondary | ICD-10-CM | POA: Diagnosis not present

## 2018-03-15 DIAGNOSIS — R600 Localized edema: Secondary | ICD-10-CM | POA: Diagnosis not present

## 2018-03-15 DIAGNOSIS — N183 Chronic kidney disease, stage 3 (moderate): Principal | ICD-10-CM

## 2018-03-15 MED ORDER — INSULIN GLARGINE 100 UNIT/ML SOLOSTAR PEN: PEN_INJECTOR | SUBCUTANEOUS | 5 refills | Status: DC

## 2018-03-15 NOTE — Assessment & Plan Note (Signed)
bp in fair control at this time  BP Readings from Last 1 Encounters:  03/15/18 124/66   No changes needed Disc lifstyle change with low sodium diet and exercise  Labs reviewed

## 2018-03-15 NOTE — Assessment & Plan Note (Signed)
Stable  F/u with renal due in april

## 2018-03-15 NOTE — Assessment & Plan Note (Signed)
LDL is very well controlled with crestor and diet  Also in setting of CAD Disc goals for lipids and reasons to control them Rev labs with pt Rev low sat fat diet in detail

## 2018-03-15 NOTE — Assessment & Plan Note (Signed)
Lab Results  Component Value Date   HGBA1C 8.0 (H) 03/09/2018   Pt is eating well but much less active since his shoulder surgery (also exp stress and pain)  Continue monitoring Will inc lantus to 20 u daily  Report back with readings in 2 wk and if needed can inc again  Will walk for exercise as tolerated  F/u 3 mo

## 2018-03-15 NOTE — Assessment & Plan Note (Signed)
Improved but not resolved/ pt tolerates  Short course of lasix helped  Suspect due to increased sitting s/p shoulder surgery Disc use of support hose (knee high) - when he can get them on  Also elevate legs when sitting Plans to walk more Watch sodium in diet Continue to monitor

## 2018-03-15 NOTE — Patient Instructions (Addendum)
Your glucose control is not as good (not as active and in pain) Please increase your lantus to 20 units daily and keep watching your glucose  Send me a report of glucose records in 2 weeks to see if we need to go up more    For swelling in feet- walk more as tolerated Elevate feet when sitting  Support knee high socks would help when you are able to put them on   Follow up in 3 months with labs prior

## 2018-03-15 NOTE — Assessment & Plan Note (Signed)
Lab Results  Component Value Date   TSH 3.18 03/19/2017

## 2018-03-15 NOTE — Progress Notes (Signed)
Subjective:    Patient ID: David Henry, male    DOB: Apr 25, 1926, 84 y.o.   MRN: [id number]  HPI Here for f/u of chronic medical problems   Arm is gradually getting better from surgery - still a lot of pain / also stress  Not as active due to pain in arm  Doing his PT  Twice weekly to [place]  20 min daily at home as well   He sees orthopedics tomorrow    Still having some edema in feet  Improved but not gone  Sees cardiology in May    Wt Readings from Last 3 Encounters:  03/15/18 160 lb 4 oz (72.7 kg)  01/13/18 165 lb 8 oz (75.1 kg)  12/29/17 150 lb (68 kg)   25.10 kg/m   bp is stable today  No cp or palpitations or headaches or edema  No side effects to medicines  BP Readings from Last 3 Encounters:  03/15/18 124/66  01/13/18 118/62  12/30/17 (!) 138/57     DM2 Lab Results  Component Value Date   HGBA1C 8.0 (H) 03/09/2018  this is up from 7.1  On januvia 50 mg  lantus 15 units every am  amaryl 4 mg bid  Ace for renal protection  Sugar has been running high Not eating differently- but having stress and pain    Renal insuff  Has renal appt in April  Lab Results  Component Value Date   CREATININE 1.70 (H) 03/09/2018   BUN 29 (H) 03/09/2018   NA 141 03/09/2018   K 4.6 03/09/2018   CL 107 03/09/2018   CO2 27 03/09/2018   Lab Results  Component Value Date   ALT 21 12/09/2017   AST 20 12/09/2017   ALKPHOS 69 12/09/2017   BILITOT 0.7 12/09/2017    Hyperlipidemia Lab Results  Component Value Date   CHOL 97 12/09/2017   CHOL 108 03/19/2017   CHOL 108 12/12/2016   Lab Results  Component Value Date   HDL 36.60 (L) 12/09/2017   HDL 46.70 03/19/2017   HDL 39.90 12/12/2016   Lab Results  Component Value Date   LDLCALC 43 12/09/2017   LDLCALC 50 03/19/2017   LDLCALC 50 12/12/2016   Lab Results  Component Value Date   TRIG 87.0 12/09/2017   TRIG 59.0 03/19/2017   TRIG 92.0 12/12/2016   Lab Results  Component Value Date   CHOLHDL 3  12/09/2017   CHOLHDL 2 03/19/2017   CHOLHDL 3 12/12/2016   No results found for: LDLDIRECT crestor and diet  Continues to be well controlled   Patient Active Problem List   Diagnosis Date Noted  . Pedal edema 01/13/2018  . Pre-op examination 12/14/2017  . Routine general medical examination at a health care facility 03/09/2016  . Constipation 09/17/2015  . Encounter for Medicare annual wellness exam 12/08/2014  . Hematuria, microscopic 05/26/2013  . Renal insufficiency 05/24/2013  . Anemia 11/23/2012  . Hypothyroid 11/23/2012  . GERD 11/29/2010  . Coronary atherosclerosis 04/18/2007  . Controlled type 2 diabetes mellitus with chronic kidney disease ([place]) 04/15/2007  . Hyperlipidemia 04/15/2007  . Essential hypertension 04/15/2007  . MYOCARDIAL INFARCTION, HX OF 04/15/2007  . ACTINIC KERATOSIS 04/15/2007   Past Medical History:  Diagnosis Date  . Arthritis   . CAD (coronary artery disease)    3 stents  . Colon polyp   . Diabetes mellitus    type II  . Dyspnea    with exertion  . GERD (gastroesophageal  reflux disease)   . Hyperlipidemia   . Hypertension   . Hypothyroidism   . Kidney stones    stage III CKD  . Neuropathy   . Peripheral vascular disease (HCC)    neuropathy in feet   Past Surgical History:  Procedure Laterality Date  . APPENDECTOMY  1984  . CATARACT EXTRACTION Bilateral 04/2001  . CHOLECYSTECTOMY  1989  . COLONOSCOPY N/A 2/14   hemorrhoids and tics (after pos ifob card)  . CORONARY ANGIOPLASTY  2006   3 stents  . ESOPHAGOGASTRODUODENOSCOPY N/A 2/14  . RETINAL DETACHMENT SURGERY Right 2003  . SHOULDER ARTHROSCOPY WITH ROTATOR CUFF REPAIR AND SUBACROMIAL DECOMPRESSION Right 12/30/2017   Procedure: SHOULDER ARTHROSCOPY WITH ROTATOR CUFF REPAIR AND SUBACROMIAL DECOMPRESSION;  Surgeon: Leanor Kail, MD;  Location: [facility] ORS;  Service: Orthopedics;  Laterality: Right;  . SHOULDER SURGERY  2012   Lt shoulder repair   Social History   Tobacco Use    . Smoking status: Never Smoker  . Smokeless tobacco: Never Used  Substance Use Topics  . Alcohol use: No    Alcohol/week: 0.0 oz  . Drug use: No   Family History  Problem Relation Age of Onset  . Cancer Brother        prostate CA  . Heart disease Mother   . Stroke Father   . Kidney disease Sister   . Kidney disease Sister   . Clotting disorder Sister   . COPD Brother   . Heart disease Brother    Allergies  Allergen Reactions  . Adhesive [Tape] Dermatitis    Skin is thin so it is tough to take off.  Use paper tape  . Azithromycin Nausea Only    REACTION: nausea  . Celecoxib Other (See Comments)    Raises blood pressure  . Zocor [Simvastatin] Other (See Comments)    Muscle pain   Current Outpatient Medications on File Prior to Visit  Medication Sig Dispense Refill  . acetaminophen (TYLENOL ARTHRITIS PAIN) 650 MG CR tablet Take 650 mg by mouth every 8 (eight) hours as needed.      Marland Kitchen amLODipine (NORVASC) 5 MG tablet Take 5 mg by mouth daily.    Marland Kitchen aspirin 81 MG tablet Take 81 mg by mouth daily.      . BD PEN NEEDLE NANO U/F 32G X 4 MM MISC USE AS DIRECTED WITH LANTUS 100 each 2  . furosemide (LASIX) 20 MG tablet Take 1 tablet (20 mg total) by mouth daily as needed for edema (for swelling). 30 tablet 0  . glimepiride (AMARYL) 4 MG tablet TAKE 1 TABLET BY MOUTH EVERY MORNING ANDTAKE 1 TABLET BY MOUTH EVERY EVENING 180 tablet 1  . glucose blood (ONE TOUCH ULTRA TEST) test strip CHECK BLOOD SUGAR TWICE DAILY AND AS DIRECTED FOR DM (DX. E11.9) 200 each 1  . levothyroxine (SYNTHROID, LEVOTHROID) 25 MCG tablet TAKE 1 TABLET BY MOUTH ONCE A DAY BEFOREBREAKFAST 90 tablet 3  . lisinopril (PRINIVIL,ZESTRIL) 10 MG tablet Take 1 tablet (10 mg total) by mouth daily. 90 tablet 1  . Multiple Vitamin (MULTIVITAMIN) capsule Take 1 capsule by mouth daily.      . NON FORMULARY 100 BC ultra fine needles    . NORCO 5-325 MG tablet Take 1-2 tablets by mouth every 4 (four) hours as needed for  moderate pain. MAXIMUM TOTAL ACETAMINOPHEN DOSE IS 4000 MG PER DAY 25 tablet 0  . Omega-3 Fatty Acids (FISH OIL PO) Take 2 capsules by mouth daily.      Marland Kitchen  rosuvastatin (CRESTOR) 5 MG tablet Take 1 tablet (5 mg total) by mouth every other day. 45 tablet 3  . senna (SENOKOT) 8.6 MG tablet Take 1 tablet by mouth 2 (two) times daily.    . sitaGLIPtin (JANUVIA) 50 MG tablet Take 1 tablet (50 mg total) by mouth daily. 90 tablet 3  . tamsulosin (FLOMAX) 0.4 MG CAPS capsule Take 1 capsule (0.4 mg total) by mouth daily. 90 capsule 3  . traMADol (ULTRAM) 50 MG tablet Take 50 mg by mouth every 6 (six) hours as needed for severe pain (shoulder pain).    . [DISCONTINUED] omeprazole (PRILOSEC OTC) 20 MG tablet Take 1 tablet (20 mg total) by mouth daily. 90 tablet 1   No current facility-administered medications on file prior to visit.     Review of Systems  Constitutional: Positive for fatigue. Negative for activity change, appetite change, fever and unexpected weight change.  HENT: Negative for congestion, rhinorrhea, sore throat and trouble swallowing.   Eyes: Negative for pain, redness, itching and visual disturbance.  Respiratory: Negative for cough, chest tightness, shortness of breath and wheezing.   Cardiovascular: Positive for leg swelling. Negative for chest pain and palpitations.  Gastrointestinal: Negative for abdominal pain, blood in stool, constipation, diarrhea and nausea.  Endocrine: Negative for cold intolerance, heat intolerance, polydipsia and polyuria.  Genitourinary: Negative for difficulty urinating, dysuria, frequency and urgency.  Musculoskeletal: Negative for arthralgias, joint swelling and myalgias.       Shoulder pain persists s/p surgery with limited rom   Skin: Negative for pallor and rash.  Neurological: Negative for dizziness, tremors, weakness, numbness and headaches.  Hematological: Negative for adenopathy. Does not bruise/bleed easily.  Psychiatric/Behavioral: Negative for  decreased concentration and dysphoric mood. The patient is not nervous/anxious.        Objective:   Physical Exam  Constitutional: He appears well-developed and well-nourished. No distress.  Well appearing   HENT:  Head: Normocephalic and atraumatic.  Mouth/Throat: Oropharynx is clear and moist.  Eyes: Pupils are equal, round, and reactive to light. Conjunctivae and EOM are normal.  Neck: Normal range of motion. Neck supple. No JVD present. Carotid bruit is not present. No thyromegaly present.  Cardiovascular: Normal rate, regular rhythm, normal heart sounds and intact distal pulses. Exam reveals no gallop.  Pulmonary/Chest: Effort normal and breath sounds normal. No respiratory distress. He has no wheezes. He has no rales.  No crackles  Abdominal: Soft. Bowel sounds are normal. He exhibits no distension, no abdominal bruit and no mass. There is no tenderness.  Musculoskeletal: He exhibits no edema.  Limited rom of R shoulder   Lymphadenopathy:    He has no cervical adenopathy.  Neurological: He is alert. He has normal reflexes. No cranial nerve deficit. Coordination normal.  Skin: Skin is warm and dry. No rash noted. No pallor.  Psychiatric: He has a normal mood and affect.          Assessment & Plan:   Problem List Items Addressed This Visit      Cardiovascular and Mediastinum   Essential hypertension    bp in fair control at this time  BP Readings from Last 1 Encounters:  03/15/18 124/66   No changes needed Disc lifstyle change with low sodium diet and exercise  Labs reviewed         Endocrine   Controlled type 2 diabetes mellitus with chronic kidney disease ([place]) - Primary    Lab Results  Component Value Date   HGBA1C 8.0 (H)  03/09/2018   Pt is eating well but much less active since his shoulder surgery (also exp stress and pain)  Continue monitoring Will inc lantus to 20 u daily  Report back with readings in 2 wk and if needed can inc again  Will walk for  exercise as tolerated  F/u 3 mo       Relevant Medications   Insulin Glargine (LANTUS SOLOSTAR) 100 UNIT/ML Solostar Pen     Genitourinary   Renal insufficiency    Stable  F/u with renal due in april        Other   Hyperlipidemia    LDL is very well controlled with crestor and diet  Also in setting of CAD Disc goals for lipids and reasons to control them Rev labs with pt Rev low sat fat diet in detail       Pedal edema    Improved but not resolved/ pt tolerates  Short course of lasix helped  Suspect due to increased sitting s/p shoulder surgery Disc use of support hose (knee high) - when he can get them on  Also elevate legs when sitting Plans to walk more Watch sodium in diet Continue to monitor

## 2018-03-31 ENCOUNTER — Telehealth: Payer: Self-pay | Admitting: Family Medicine

## 2018-03-31 NOTE — Telephone Encounter (Signed)
These look very good from 70s-120 for the most part One was 60-if he continues to get low readings let me know   If any afternoon highs or lows let me know Continue current medicine doses

## 2018-03-31 NOTE — Telephone Encounter (Signed)
Pt dropped off blood sugar readings  On cart

## 2018-04-01 NOTE — Telephone Encounter (Signed)
Called pt and no answer, just kept ringing

## 2018-04-05 ENCOUNTER — Other Ambulatory Visit: Payer: Self-pay | Admitting: Family Medicine

## 2018-04-08 NOTE — Telephone Encounter (Signed)
Left VM letting pt know Dr. Tower's comments  

## 2018-05-06 ENCOUNTER — Other Ambulatory Visit: Payer: Self-pay | Admitting: Family Medicine

## 2018-06-08 ENCOUNTER — Other Ambulatory Visit: Payer: Self-pay | Admitting: Family Medicine

## 2018-06-16 ENCOUNTER — Other Ambulatory Visit: Payer: Self-pay | Admitting: Family Medicine

## 2018-06-18 ENCOUNTER — Other Ambulatory Visit (INDEPENDENT_AMBULATORY_CARE_PROVIDER_SITE_OTHER): Payer: Medicare Other

## 2018-06-18 DIAGNOSIS — E7800 Pure hypercholesterolemia, unspecified: Secondary | ICD-10-CM

## 2018-06-18 DIAGNOSIS — N183 Chronic kidney disease, stage 3 unspecified: Secondary | ICD-10-CM

## 2018-06-18 DIAGNOSIS — E039 Hypothyroidism, unspecified: Secondary | ICD-10-CM | POA: Diagnosis not present

## 2018-06-18 DIAGNOSIS — I10 Essential (primary) hypertension: Secondary | ICD-10-CM | POA: Diagnosis not present

## 2018-06-18 DIAGNOSIS — Z794 Long term (current) use of insulin: Secondary | ICD-10-CM

## 2018-06-18 DIAGNOSIS — N289 Disorder of kidney and ureter, unspecified: Secondary | ICD-10-CM

## 2018-06-18 DIAGNOSIS — E1122 Type 2 diabetes mellitus with diabetic chronic kidney disease: Secondary | ICD-10-CM

## 2018-06-18 LAB — CBC WITH DIFFERENTIAL/PLATELET
Basophils Absolute: 0.1 10*3/uL (ref 0.0–0.1)
Basophils Relative: 0.7 % (ref 0.0–3.0)
EOS PCT: 4.1 % (ref 0.0–5.0)
Eosinophils Absolute: 0.3 10*3/uL (ref 0.0–0.7)
HEMATOCRIT: 38.7 % — AB (ref 39.0–52.0)
HEMOGLOBIN: 13 g/dL (ref 13.0–17.0)
LYMPHS PCT: 22 % (ref 12.0–46.0)
Lymphs Abs: 1.7 10*3/uL (ref 0.7–4.0)
MCHC: 33.5 g/dL (ref 30.0–36.0)
MCV: 101.1 fl — AB (ref 78.0–100.0)
MONO ABS: 0.8 10*3/uL (ref 0.1–1.0)
MONOS PCT: 10.2 % (ref 3.0–12.0)
Neutro Abs: 4.8 10*3/uL (ref 1.4–7.7)
Neutrophils Relative %: 63 % (ref 43.0–77.0)
Platelets: 231 10*3/uL (ref 150.0–400.0)
RBC: 3.83 Mil/uL — ABNORMAL LOW (ref 4.22–5.81)
RDW: 13.7 % (ref 11.5–15.5)
WBC: 7.7 10*3/uL (ref 4.0–10.5)

## 2018-06-18 LAB — COMPREHENSIVE METABOLIC PANEL
ALBUMIN: 4.2 g/dL (ref 3.5–5.2)
ALT: 23 U/L (ref 0–53)
AST: 19 U/L (ref 0–37)
Alkaline Phosphatase: 68 U/L (ref 39–117)
BUN: 35 mg/dL — ABNORMAL HIGH (ref 6–23)
CO2: 26 mEq/L (ref 19–32)
Calcium: 9.7 mg/dL (ref 8.4–10.5)
Chloride: 109 mEq/L (ref 96–112)
Creatinine, Ser: 1.88 mg/dL — ABNORMAL HIGH (ref 0.40–1.50)
GFR: 36.01 mL/min — AB (ref >60.00)
Glucose, Bld: 62 mg/dL — ABNORMAL LOW (ref 70–99)
POTASSIUM: 4.7 meq/L (ref 3.5–5.1)
Sodium: 143 mEq/L (ref 135–145)
TOTAL PROTEIN: 6.8 g/dL (ref 6.0–8.3)
Total Bilirubin: 0.7 mg/dL (ref 0.2–1.2)

## 2018-06-18 LAB — LIPID PANEL
CHOL/HDL RATIO: 3
CHOLESTEROL: 108 mg/dL (ref 0–200)
HDL: 38.4 mg/dL — ABNORMAL LOW (ref >39.00)
LDL CALC: 57 mg/dL (ref 0–99)
NonHDL: 70.03
TRIGLYCERIDES: 64 mg/dL (ref 0.0–149.0)
VLDL: 12.8 mg/dL (ref 0.0–40.0)

## 2018-06-18 LAB — TSH: TSH: 3.77 u[IU]/mL (ref 0.35–4.50)

## 2018-06-18 LAB — HEMOGLOBIN A1C: Hgb A1c MFr Bld: 6.8 % — ABNORMAL HIGH (ref 4.6–6.5)

## 2018-06-22 ENCOUNTER — Ambulatory Visit: Payer: Medicare Other | Admitting: Family Medicine

## 2018-06-22 ENCOUNTER — Telehealth: Payer: Self-pay | Admitting: *Deleted

## 2018-06-22 DIAGNOSIS — Z0289 Encounter for other administrative examinations: Secondary | ICD-10-CM

## 2018-06-22 NOTE — Telephone Encounter (Signed)
Please re schedule appt A1C was down to 6.8  Cr is up however    Mail copy of labs if he wants them also Thanks

## 2018-06-22 NOTE — Telephone Encounter (Signed)
Results not annotated as they were to be discuss at [place] pt missed. pls advise

## 2018-06-22 NOTE — Telephone Encounter (Signed)
Copied from [place] [id number]. Topic: Quick Communication - Lab Results >> Jun 22, 2018  1:56 PM Lennox Solders wrote: Pt missed his appointment he looked at calendar wrong. Pt would like blood work results

## 2018-06-23 NOTE — Telephone Encounter (Signed)
appt rescheduled till this Monday 06/28/18, I didn't mail labs because pt will get a copy at the f/u appt, lab results given to pt

## 2018-06-28 ENCOUNTER — Encounter: Payer: Self-pay | Admitting: Family Medicine

## 2018-06-28 ENCOUNTER — Ambulatory Visit: Payer: Medicare Other | Admitting: Family Medicine

## 2018-06-28 VITALS — BP 136/60 | HR 60 | Temp 97.9°F | Ht 67.0 in | Wt 162.2 lb

## 2018-06-28 DIAGNOSIS — N289 Disorder of kidney and ureter, unspecified: Secondary | ICD-10-CM

## 2018-06-28 DIAGNOSIS — Z794 Long term (current) use of insulin: Secondary | ICD-10-CM | POA: Diagnosis not present

## 2018-06-28 DIAGNOSIS — E039 Hypothyroidism, unspecified: Secondary | ICD-10-CM

## 2018-06-28 DIAGNOSIS — I10 Essential (primary) hypertension: Secondary | ICD-10-CM | POA: Diagnosis not present

## 2018-06-28 DIAGNOSIS — E1122 Type 2 diabetes mellitus with diabetic chronic kidney disease: Secondary | ICD-10-CM | POA: Diagnosis not present

## 2018-06-28 DIAGNOSIS — E7800 Pure hypercholesterolemia, unspecified: Secondary | ICD-10-CM | POA: Diagnosis not present

## 2018-06-28 DIAGNOSIS — N183 Chronic kidney disease, stage 3 (moderate): Secondary | ICD-10-CM

## 2018-06-28 NOTE — Assessment & Plan Note (Signed)
Now off amlodipine (due to edema) and doing well with lisinipril from cardiol bp in fair control at this time  BP Readings from Last 1 Encounters:  06/28/18 136/60   No changes needed Most recent labs reviewed  Disc lifstyle change with low sodium diet and exercise

## 2018-06-28 NOTE — Patient Instructions (Addendum)
Let's stick to 15 units of lantus now that you are eating better and moving more  Keep up the good work with diet and exercise   Get your eye exam as planned   Aim for 64 oz of fluid daily (mostly water) for kidney health   We will see you in august as planned

## 2018-06-28 NOTE — Assessment & Plan Note (Signed)
GFR is 36 Continue nephrology f/u

## 2018-06-28 NOTE — Progress Notes (Signed)
Subjective:    Patient ID: David Henry, male    DOB: 06-Sep-1926, 84 y.o.   MRN: [id number]  HPI  Here for f/u of chronic medical problems   Still working on shoulder mobility- had PT for 26 trips  Still a little sore if he moves just right / but does nto need pain medicine   Wt Readings from Last 3 Encounters:  06/28/18 162 lb 4 oz (73.6 kg)  03/15/18 160 lb 4 oz (72.7 kg)  01/13/18 165 lb 8 oz (75.1 kg)   25.41 kg/m   HTN in setting of CAD and DM bp is stable today  No cp or palpitations or headaches or edema  No side effects to medicines  His cardiologist stopped amlodipine due to edema (that is much better!)  BP Readings from Last 3 Encounters:  06/28/18 136/60  03/15/18 124/66  01/13/18 118/62    Takes lisinopril   DM with chronic renal dz Type 2 Lab Results  Component Value Date   HGBA1C 6.8 (H) 06/18/2018   This is down from 8.0  Last visit inc his lantus to 20 u daily  Glucose was running low at night - so he takes 15 u if sugar is 80 or below  Had one glucose of 65  Also more active as he continue to improve from shoulder surgery  Diet-improved/less carbs  Exercise - much improved  DM eye exam -has an appt scheduled for august (a little more problems with reading -thinks he needs new glasses)  Fasting glucose was low (62) on lab report as well  No foot symptoms or problems - foot exam was in march   On lantus/amaryl and januvia   Hypothyroidism  Pt has no clinical changes No change in energy level/ hair or skin/ edema and no tremor Lab Results  Component Value Date   TSH 3.77 06/18/2018     Renal insuff Lab Results  Component Value Date   CREATININE 1.88 (H) 06/18/2018   BUN 35 (H) 06/18/2018   NA 143 06/18/2018   K 4.7 06/18/2018   CL 109 06/18/2018   CO2 26 06/18/2018   Sees nephrology  Stable overall  Cr is up GFR is 36.01 He does not drink much water between meals   Lab Results  Component Value Date   ALT 23 06/18/2018   AST  19 06/18/2018   ALKPHOS 68 06/18/2018   BILITOT 0.7 06/18/2018     Hyperlipidemia (setting of CAD) Lab Results  Component Value Date   CHOL 108 06/18/2018   CHOL 97 12/09/2017   CHOL 108 03/19/2017   Lab Results  Component Value Date   HDL 38.40 (L) 06/18/2018   HDL 36.60 (L) 12/09/2017   HDL 46.70 03/19/2017   Lab Results  Component Value Date   LDLCALC 57 06/18/2018   LDLCALC 43 12/09/2017   LDLCALC 50 03/19/2017   Lab Results  Component Value Date   TRIG 64.0 06/18/2018   TRIG 87.0 12/09/2017   TRIG 59.0 03/19/2017   Lab Results  Component Value Date   CHOLHDL 3 06/18/2018   CHOLHDL 3 12/09/2017   CHOLHDL 2 03/19/2017   No results found for: LDLDIRECT Stable/well controlled with crestor and diet   Patient Active Problem List   Diagnosis Date Noted  . Pre-op examination 12/14/2017  . Routine general medical examination at a health care facility 03/09/2016  . Constipation 09/17/2015  . Encounter for Medicare annual wellness exam 12/08/2014  . Hematuria, microscopic 05/26/2013  .  Renal insufficiency 05/24/2013  . Anemia 11/23/2012  . Hypothyroid 11/23/2012  . GERD 11/29/2010  . Coronary atherosclerosis 04/18/2007  . Controlled type 2 diabetes mellitus with chronic kidney disease ([place]) 04/15/2007  . Hyperlipidemia 04/15/2007  . Essential hypertension 04/15/2007  . MYOCARDIAL INFARCTION, HX OF 04/15/2007  . ACTINIC KERATOSIS 04/15/2007   Past Medical History:  Diagnosis Date  . Arthritis   . CAD (coronary artery disease)    3 stents  . Colon polyp   . Diabetes mellitus    type II  . Dyspnea    with exertion  . GERD (gastroesophageal reflux disease)   . Hyperlipidemia   . Hypertension   . Hypothyroidism   . Kidney stones    stage III CKD  . Neuropathy   . Peripheral vascular disease (HCC)    neuropathy in feet   Past Surgical History:  Procedure Laterality Date  . APPENDECTOMY  1984  . CATARACT EXTRACTION Bilateral 04/2001  .  CHOLECYSTECTOMY  1989  . COLONOSCOPY N/A 2/14   hemorrhoids and tics (after pos ifob card)  . CORONARY ANGIOPLASTY  2006   3 stents  . ESOPHAGOGASTRODUODENOSCOPY N/A 2/14  . RETINAL DETACHMENT SURGERY Right 2003  . SHOULDER ARTHROSCOPY WITH ROTATOR CUFF REPAIR AND SUBACROMIAL DECOMPRESSION Right 12/30/2017   Procedure: SHOULDER ARTHROSCOPY WITH ROTATOR CUFF REPAIR AND SUBACROMIAL DECOMPRESSION;  Surgeon: Leanor Kail, MD;  Location: [facility] ORS;  Service: Orthopedics;  Laterality: Right;  . SHOULDER SURGERY  2012   Lt shoulder repair   Social History   Tobacco Use  . Smoking status: Never Smoker  . Smokeless tobacco: Never Used  Substance Use Topics  . Alcohol use: No    Alcohol/week: 0.0 oz  . Drug use: No   Family History  Problem Relation Age of Onset  . Cancer Brother        prostate CA  . Heart disease Mother   . Stroke Father   . Kidney disease Sister   . Kidney disease Sister   . Clotting disorder Sister   . COPD Brother   . Heart disease Brother    Allergies  Allergen Reactions  . Adhesive [Tape] Dermatitis    Skin is thin so it is tough to take off.  Use paper tape  . Azithromycin Nausea Only    REACTION: nausea  . Celecoxib Other (See Comments)    Raises blood pressure  . Zocor [Simvastatin] Other (See Comments)    Muscle pain   Current Outpatient Medications on File Prior to Visit  Medication Sig Dispense Refill  . acetaminophen (TYLENOL ARTHRITIS PAIN) 650 MG CR tablet Take 650 mg by mouth every 8 (eight) hours as needed.      Marland Kitchen aspirin 81 MG tablet Take 81 mg by mouth daily.      . BD PEN NEEDLE NANO U/F 32G X 4 MM MISC USE AS DIRECTED WITH LANTUS 100 each 2  . glimepiride (AMARYL) 4 MG tablet TAKE 1 TABLET BY MOUTH EVERY MORNING ANDTAKE 1 TABLET BY MOUTH EVERY EVENING 180 tablet 1  . glucose blood (ONE TOUCH ULTRA TEST) test strip CHECK BLOOD SUGAR TWICE DAILY AND AS DIRECTED FROM DM (DX. E11.22, N18.3, Z79.4) 200 each 1  . Insulin Glargine (LANTUS  SOLOSTAR) 100 UNIT/ML Solostar Pen INJECT 20 UNITS INTO THE SKIN EVERY MORNING (Patient taking differently: Inject 15 Units into the skin daily. INJECT 20 UNITS INTO THE SKIN EVERY MORNING) 15 mL 5  . JANUVIA 50 MG tablet TAKE 1 TABLET BY MOUTH  DAILY 90 tablet 0  . levothyroxine (SYNTHROID, LEVOTHROID) 25 MCG tablet TAKE 1 TABLET BY MOUTH ONCE A DAY BEFOREBREAKFAST 90 tablet 0  . lisinopril (PRINIVIL,ZESTRIL) 10 MG tablet TAKE 1 TABLET BY MOUTH ONCE A DAY 90 tablet 1  . Multiple Vitamin (MULTIVITAMIN) capsule Take 1 capsule by mouth daily.      . NON FORMULARY 100 BC ultra fine needles    . Omega-3 Fatty Acids (FISH OIL PO) Take 2 capsules by mouth daily.      . ranitidine (ZANTAC) 150 MG capsule Take 150 mg by mouth daily as needed for heartburn.    . rosuvastatin (CRESTOR) 5 MG tablet TAKE 1 TABLET BY MOUTH EVERY OTHER DAY 45 tablet 3  . senna (SENOKOT) 8.6 MG tablet Take 1 tablet by mouth 2 (two) times daily.    . tamsulosin (FLOMAX) 0.4 MG CAPS capsule TAKE 1 CAPSULE BY MOUTH DAILY 90 capsule 3  . [DISCONTINUED] omeprazole (PRILOSEC OTC) 20 MG tablet Take 1 tablet (20 mg total) by mouth daily. 90 tablet 1   No current facility-administered medications on file prior to visit.     Review of Systems  Constitutional: Negative for activity change, appetite change, fatigue, fever and unexpected weight change.  HENT: Negative for congestion, rhinorrhea, sore throat and trouble swallowing.   Eyes: Negative for pain, redness, itching and visual disturbance.  Respiratory: Negative for cough, chest tightness, shortness of breath and wheezing.   Cardiovascular: Negative for chest pain and palpitations.  Gastrointestinal: Negative for abdominal pain, blood in stool, constipation, diarrhea and nausea.  Endocrine: Negative for cold intolerance, heat intolerance, polydipsia and polyuria.  Genitourinary: Negative for difficulty urinating, dysuria, frequency and urgency.  Musculoskeletal: Negative for  arthralgias, joint swelling and myalgias.       Shoulder stiffness/ pain has improved   Skin: Negative for pallor and rash.  Neurological: Negative for dizziness, tremors, weakness, numbness and headaches.  Hematological: Negative for adenopathy. Does not bruise/bleed easily.  Psychiatric/Behavioral: Negative for decreased concentration and dysphoric mood. The patient is not nervous/anxious.        Objective:   Physical Exam  Constitutional: He appears well-developed and well-nourished. No distress.  Well appearing   HENT:  Head: Normocephalic and atraumatic.  Mouth/Throat: Oropharynx is clear and moist.  Eyes: Pupils are equal, round, and reactive to light. Conjunctivae and EOM are normal.  Neck: Normal range of motion. Neck supple. No JVD present. Carotid bruit is not present. No thyromegaly present.  Cardiovascular: Normal rate, regular rhythm, normal heart sounds and intact distal pulses. Exam reveals no gallop.  Pulmonary/Chest: Effort normal and breath sounds normal. No respiratory distress. He has no wheezes. He has no rales.  No crackles  Abdominal: Soft. Bowel sounds are normal. He exhibits no distension, no abdominal bruit and no mass. There is no tenderness.  Musculoskeletal: He exhibits no edema or tenderness.  Lymphadenopathy:    He has no cervical adenopathy.  Neurological: He is alert. He has normal reflexes. He displays normal reflexes. No cranial nerve deficit. Coordination normal.  Skin: Skin is warm and dry. No rash noted.  Psychiatric: He has a normal mood and affect.  Pleasant           Assessment & Plan:   Problem List Items Addressed This Visit      Cardiovascular and Mediastinum   Essential hypertension    Now off amlodipine (due to edema) and doing well with lisinipril from cardiol bp in fair control at this time  BP  Readings from Last 1 Encounters:  06/28/18 136/60   No changes needed Most recent labs reviewed  Disc lifstyle change with low  sodium diet and exercise          Endocrine   Controlled type 2 diabetes mellitus with chronic kidney disease ([place]) - Primary    Lab Results  Component Value Date   HGBA1C 6.8 (H) 06/18/2018   Big improvement  Will cut lantus back to 15 u daily in light of some hypoglycemia  Continue other meds More active and eating better Eye exam planned for aug Good foot care       Hypothyroid    Hypothyroidism  Pt has no clinical changes No change in energy level/ hair or skin/ edema and no tremor Lab Results  Component Value Date   TSH 3.77 06/18/2018            Genitourinary   Renal insufficiency    GFR is 36 Continue nephrology f/u        Other   Hyperlipidemia    Disc goals for lipids and reasons to control them Rev last labs with pt Rev low sat fat diet in detail Doing well with crestor/diet/exercise

## 2018-06-28 NOTE — Assessment & Plan Note (Signed)
Lab Results  Component Value Date   HGBA1C 6.8 (H) 06/18/2018   Big improvement  Will cut lantus back to 15 u daily in light of some hypoglycemia  Continue other meds More active and eating better Eye exam planned for aug Good foot care

## 2018-06-28 NOTE — Assessment & Plan Note (Signed)
Disc goals for lipids and reasons to control them Rev last labs with pt Rev low sat fat diet in detail Doing well with crestor/diet/exercise

## 2018-06-28 NOTE — Assessment & Plan Note (Signed)
Hypothyroidism  Pt has no clinical changes No change in energy level/ hair or skin/ edema and no tremor Lab Results  Component Value Date   TSH 3.77 06/18/2018

## 2018-07-08 ENCOUNTER — Other Ambulatory Visit: Payer: Self-pay | Admitting: Family Medicine

## 2018-07-22 ENCOUNTER — Other Ambulatory Visit: Payer: Self-pay | Admitting: Family Medicine

## 2018-07-29 ENCOUNTER — Emergency Department: Payer: Medicare Other

## 2018-07-29 ENCOUNTER — Other Ambulatory Visit: Payer: Self-pay

## 2018-07-29 ENCOUNTER — Encounter: Payer: Self-pay | Admitting: Emergency Medicine

## 2018-07-29 ENCOUNTER — Emergency Department
Admission: EM | Admit: 2018-07-29 | Discharge: 2018-07-29 | Disposition: A | Discharge status: Home / Self Care | Payer: Medicare Other | Attending: Emergency Medicine | Admitting: Emergency Medicine

## 2018-07-29 DIAGNOSIS — Z79899 Other long term (current) drug therapy: Secondary | ICD-10-CM | POA: Diagnosis not present

## 2018-07-29 DIAGNOSIS — R55 Syncope and collapse: Secondary | ICD-10-CM

## 2018-07-29 DIAGNOSIS — S0990XA Unspecified injury of head, initial encounter: Secondary | ICD-10-CM | POA: Insufficient documentation

## 2018-07-29 DIAGNOSIS — Z794 Long term (current) use of insulin: Secondary | ICD-10-CM | POA: Insufficient documentation

## 2018-07-29 DIAGNOSIS — Y92009 Unspecified place in unspecified non-institutional (private) residence as the place of occurrence of the external cause: Secondary | ICD-10-CM | POA: Insufficient documentation

## 2018-07-29 DIAGNOSIS — Y999 Unspecified external cause status: Secondary | ICD-10-CM | POA: Insufficient documentation

## 2018-07-29 DIAGNOSIS — Z23 Encounter for immunization: Secondary | ICD-10-CM | POA: Insufficient documentation

## 2018-07-29 DIAGNOSIS — Y9389 Activity, other specified: Secondary | ICD-10-CM | POA: Diagnosis not present

## 2018-07-29 DIAGNOSIS — W1812XA Fall from or off toilet with subsequent striking against object, initial encounter: Secondary | ICD-10-CM | POA: Diagnosis not present

## 2018-07-29 LAB — CBC
HCT: 37.7 % — ABNORMAL LOW (ref 40.0–52.0)
Hemoglobin: 13 g/dL (ref 13.0–18.0)
MCH: 34.7 pg — ABNORMAL HIGH (ref 26.0–34.0)
MCHC: 34.4 g/dL (ref 32.0–36.0)
MCV: 100.8 fL — AB (ref 80.0–100.0)
PLATELETS: 210 10*3/uL (ref 150–440)
RBC: 3.74 MIL/uL — AB (ref 4.40–5.90)
RDW: 13.9 % (ref 11.5–14.5)
WBC: 10.7 10*3/uL — AB (ref 3.8–10.6)

## 2018-07-29 LAB — GLUCOSE, CAPILLARY: Glucose-Capillary: 161 mg/dL — ABNORMAL HIGH (ref 70–99)

## 2018-07-29 LAB — BASIC METABOLIC PANEL
Anion gap: 6 (ref 5–15)
BUN: 29 mg/dL — AB (ref 8–23)
CHLORIDE: 110 mmol/L (ref 98–111)
CO2: 25 mmol/L (ref 22–32)
CREATININE: 1.8 mg/dL — AB (ref 0.61–1.24)
Calcium: 9.3 mg/dL (ref 8.9–10.3)
GFR calc Af Amer: 36 mL/min — ABNORMAL LOW (ref >60)
GFR calc non Af Amer: 31 mL/min — ABNORMAL LOW (ref >60)
Glucose, Bld: 189 mg/dL — ABNORMAL HIGH (ref 70–99)
Potassium: 4.8 mmol/L (ref 3.5–5.1)
SODIUM: 141 mmol/L (ref 135–145)

## 2018-07-29 LAB — URINALYSIS, COMPLETE (UACMP) WITH MICROSCOPIC
BACTERIA UA: NONE SEEN
Bilirubin Urine: NEGATIVE
Glucose, UA: 50 mg/dL — AB
Hgb urine dipstick: NEGATIVE
KETONES UR: NEGATIVE mg/dL
LEUKOCYTES UA: NEGATIVE
Nitrite: NEGATIVE
PROTEIN: NEGATIVE mg/dL
SQUAMOUS EPITHELIAL / LPF: NONE SEEN (ref 0–5)
Specific Gravity, Urine: 1.014 (ref 1.005–1.030)
pH: 5 (ref 5.0–8.0)

## 2018-07-29 LAB — TROPONIN I: Troponin I: <0.03 ng/mL (ref <0.03)

## 2018-07-29 MED ORDER — TETANUS-DIPHTH-ACELL PERTUSSIS 5-2.5-18.5 LF-MCG/0.5 IM SUSP
0.5000 mL | Freq: Once | INTRAMUSCULAR | Status: AC
  Administered 2018-07-29: 0.5 mL via INTRAMUSCULAR
  Filled 2018-07-29: qty 0.5

## 2018-07-29 NOTE — ED Provider Notes (Signed)
[facility] Emergency Department Provider Note ____________________________________________   First MD Initiated Contact with Patient 07/29/18 1034     (approximate)  I have reviewed the triage vital signs and the nursing notes.   HISTORY  Chief Complaint Loss of Consciousness and Fall  HPI David Henry is a 84 y.o. male history of CAD with 3 stents was presented to the emergency department today after syncopal episode.  He states that he had gone out to get the paper and then when he came back in and sat down at his kitchen table he began to feel hot and lightheaded.  He went into another room to turn on a fan at which point he passed out, hitting his face on several flowerpots.  He was very briefly unconscious because his wife came into the room as soon as she heard him fall and he was already awake and alert.  No reports of the patient losing bowel or bladder continence.  No prior reports of syncope.  Patient back to his baseline at this time.  Denies feelings of any chest pain or palpitations.  Says that he had also just eaten breakfast before the event occurred.  Denies any change in his diet, nausea, vomiting or diarrhea or being outside in the heat for long periods of time recently.  Patient reports mild neck stiffness but denies any approximately weakness, or numbness.  Past Medical History:  Diagnosis Date  . Arthritis   . CAD (coronary artery disease)    3 stents  . Colon polyp   . Diabetes mellitus    type II  . Dyspnea    with exertion  . GERD (gastroesophageal reflux disease)   . Hyperlipidemia   . Hypertension   . Hypothyroidism   . Kidney stones    stage III CKD  . Neuropathy   . Peripheral vascular disease ([place])    neuropathy in feet    Patient Active Problem List   Diagnosis Date Noted  . Pre-op examination 12/14/2017  . Routine general medical examination at a health care facility 03/09/2016  . Constipation 09/17/2015  .  Encounter for Medicare annual wellness exam 12/08/2014  . Hematuria, microscopic 05/26/2013  . Renal insufficiency 05/24/2013  . Anemia 11/23/2012  . Hypothyroid 11/23/2012  . GERD 11/29/2010  . Coronary atherosclerosis 04/18/2007  . Controlled type 2 diabetes mellitus with chronic kidney disease ([place]) 04/15/2007  . Hyperlipidemia 04/15/2007  . Essential hypertension 04/15/2007  . MYOCARDIAL INFARCTION, HX OF 04/15/2007  . ACTINIC KERATOSIS 04/15/2007    Past Surgical History:  Procedure Laterality Date  . APPENDECTOMY  1984  . CATARACT EXTRACTION Bilateral 04/2001  . CHOLECYSTECTOMY  1989  . COLONOSCOPY N/A 2/14   hemorrhoids and tics (after pos ifob card)  . CORONARY ANGIOPLASTY  2006   3 stents  . ESOPHAGOGASTRODUODENOSCOPY N/A 2/14  . RETINAL DETACHMENT SURGERY Right 2003  . SHOULDER ARTHROSCOPY WITH ROTATOR CUFF REPAIR AND SUBACROMIAL DECOMPRESSION Right 12/30/2017   Procedure: SHOULDER ARTHROSCOPY WITH ROTATOR CUFF REPAIR AND SUBACROMIAL DECOMPRESSION;  Surgeon: Leanor Kail, MD;  Location: [facility] ORS;  Service: Orthopedics;  Laterality: Right;  . SHOULDER SURGERY  2012   Lt shoulder repair    Prior to Admission medications   Medication Sig Start Date End Date Taking? Authorizing Provider  aspirin 81 MG tablet Take 81 mg by mouth daily.     Yes [provider]  glimepiride (AMARYL) 4 MG tablet TAKE 1 TABLET BY MOUTH EVERY MORNING ANDTAKE 1 TABLET  BY MOUTH EVERY EVENING Patient taking differently: Take 4 mg by mouth 2 (two) times daily.  12/31/17  Yes Tower, Wynelle Fanny, MD  Insulin Glargine (LANTUS SOLOSTAR) 100 UNIT/ML Solostar Pen INJECT 20 UNITS INTO THE SKIN EVERY MORNING Patient taking differently: Inject 15 Units into the skin daily. INJECT 20 UNITS INTO THE SKIN EVERY MORNING 03/15/18  Yes Tower, Wynelle Fanny, MD  JANUVIA 50 MG tablet TAKE 1 TABLET BY MOUTH ONCE A DAY 07/22/18  Yes Tonia Ghent, MD  levothyroxine (SYNTHROID, LEVOTHROID) 25 MCG tablet TAKE 1 TABLET  BY MOUTH ONCE A DAY BEFOREBREAKFAST. 07/09/18  Yes Tower, Wynelle Fanny, MD  lisinopril (PRINIVIL,ZESTRIL) 10 MG tablet TAKE 1 TABLET BY MOUTH ONCE A DAY 06/09/18  Yes Tower, Wynelle Fanny, MD  Multiple Vitamin (MULTIVITAMIN) capsule Take 1 capsule by mouth daily.     Yes [provider]  Omega-3 Fatty Acids (FISH OIL PO) Take 2 capsules by mouth daily.     Yes [provider]  ranitidine (ZANTAC) 150 MG capsule Take 150 mg by mouth daily as needed for heartburn.   Yes [provider]  rosuvastatin (CRESTOR) 5 MG tablet TAKE 1 TABLET BY MOUTH EVERY OTHER DAY 06/16/18  Yes Tower, Wynelle Fanny, MD  senna (SENOKOT) 8.6 MG tablet Take 1 tablet by mouth 2 (two) times daily.   Yes [provider]  tamsulosin (FLOMAX) 0.4 MG CAPS capsule TAKE 1 CAPSULE BY MOUTH DAILY 05/06/18  Yes Tower, Wynelle Fanny, MD  acetaminophen (TYLENOL ARTHRITIS PAIN) 650 MG CR tablet Take 650 mg by mouth every 8 (eight) hours as needed.      [provider]  BD PEN NEEDLE NANO U/F 32G X 4 MM MISC USE AS DIRECTED WITH LANTUS 12/18/17   Tower, [place] A, MD  glucose blood (ONE TOUCH ULTRA TEST) test strip CHECK BLOOD SUGAR TWICE DAILY AND AS DIRECTED FROM DM (DX. E11.22, N18.3, Z79.4) 06/16/18   Tower, Wynelle Fanny, MD  NON FORMULARY 100 BC ultra fine needles    [provider]  omeprazole (PRILOSEC OTC) 20 MG tablet Take 1 tablet (20 mg total) by mouth daily. 12/06/13 06/14/14  Tower, Wynelle Fanny, MD    Allergies Adhesive [tape]; Azithromycin; Celecoxib; and Zocor [simvastatin]  Family History  Problem Relation Age of Onset  . Cancer Brother        prostate CA  . Heart disease Mother   . Stroke Father   . Kidney disease Sister   . Kidney disease Sister   . Clotting disorder Sister   . COPD Brother   . Heart disease Brother     Social History Social History   Tobacco Use  . Smoking status: Never Smoker  . Smokeless tobacco: Never Used  Substance Use Topics  . Alcohol use: No    Alcohol/week: 0.0  standard drinks  . Drug use: No    Review of Systems  Constitutional: No fever/chills Eyes: No visual changes. ENT: No sore throat. Cardiovascular: Denies chest pain. Respiratory: Denies shortness of breath. Gastrointestinal: No abdominal pain.  No nausea, no vomiting.  No diarrhea.  No constipation. Genitourinary: Negative for dysuria. Musculoskeletal: Negative for back pain. Skin: Negative for rash. Neurological: Negative for headaches, focal weakness or numbness.   ____________________________________________   PHYSICAL EXAM:  VITAL SIGNS: ED Triage Vitals  Enc Vitals Group     BP 07/29/18 1011 (!) 143/50     Pulse Rate 07/29/18 1011 (!) 55     Resp 07/29/18 1011 16  Temp 07/29/18 1011 97.7 F (36.5 C)     Temp Source 07/29/18 1011 Oral     SpO2 07/29/18 1011 97 %     Weight 07/29/18 1009 155 lb (70.3 kg)     Height 07/29/18 1009 5\' 8"  (1.727 m)     Head Circumference --      Peak Flow --      Pain Score 07/29/18 1009 0     Pain Loc --      Pain Edu? --      Excl. in [place]? --     Constitutional: Alert and oriented. Well appearing and in no acute distress. Eyes: Conjunctivae are normal.  Head: Ecchymosis surrounding the left periorbital region.  Several skin tears as well lateral to the left orbit without any active bleeding.  No lacerations.  Skin tears are small and superficial, approximately 1 to 2 cm at the greatest length. Nose: No congestion/rhinnorhea. Mouth/Throat: Mucous membranes are moist.  Neck: No stridor.  No tenderness to palpation cervical spine.  No deformity or step-off.   Cardiovascular: Normal rate, regular rhythm. Grossly normal heart sounds.  Respiratory: Normal respiratory effort.  No retractions. Lungs CTAB. Gastrointestinal: Soft and nontender. No distention. No CVA tenderness. Musculoskeletal: No lower extremity tenderness nor edema.  No joint effusions. Neurologic:  Normal speech and language. No gross focal neurologic deficits are  appreciated.  5 out of 5 strength to bilateral upper extremities.  No sensation deficit to light touch. Skin:  Skin is warm, dry and intact. No rash noted. Psychiatric: Mood and affect are normal. Speech and behavior are normal.  ____________________________________________   LABS (all labs ordered are listed, but only abnormal results are displayed)  Labs Reviewed  BASIC METABOLIC PANEL - Abnormal; Notable for the following components:      Result Value   Glucose, Bld 189 (*)    BUN 29 (*)    Creatinine, Ser 1.80 (*)    GFR calc non Af Amer 31 (*)    GFR calc Af Amer 36 (*)    All other components within normal limits  CBC - Abnormal; Notable for the following components:   WBC 10.7 (*)    RBC 3.74 (*)    HCT 37.7 (*)    MCV 100.8 (*)    MCH 34.7 (*)    All other components within normal limits  URINALYSIS, COMPLETE (UACMP) WITH MICROSCOPIC - Abnormal; Notable for the following components:   Color, Urine YELLOW (*)    APPearance CLEAR (*)    Glucose, UA 50 (*)    All other components within normal limits  GLUCOSE, CAPILLARY - Abnormal; Notable for the following components:   Glucose-Capillary 161 (*)    All other components within normal limits  TROPONIN I  TROPONIN I  CBG MONITORING, ED   ____________________________________________  EKG  ED ECG REPORT I, Doran Stabler, the attending physician, personally viewed and interpreted this ECG.   Date: 07/29/2018  EKG Time: 1004  Rate: 58  Rhythm: sinus bradycardia  Axis: Normal  Intervals:left anterior fascicular block  ST&T Change: No ST segment elevation or depression.  No abnormal T wave inversion.  ____________________________________________  RADIOLOGY  CT head, cervical spine as well as maxillofacial without any acute process. ____________________________________________   PROCEDURES  Procedure(s) performed:   Procedures  Critical Care performed:    ____________________________________________   INITIAL IMPRESSION / ASSESSMENT AND PLAN / ED COURSE  Pertinent labs & imaging results that were available during my care of  the patient were reviewed by me and considered in my medical decision making (see chart for details).  DDX: Arrhythmia, syncopal episode, dehydration, kidney failure, ACS As part of my medical decision making, I reviewed the following data within the [place] Notes from prior outpatient visits.   ----------------------------------------- 2:53 PM on 07/29/2018 -----------------------------------------  Patient at this time is asymptomatic.  Reassuring work-up with labs either negative or at baseline.  Also reviewed the patient's stress echo from this past January which had an EF of greater than 55%.  Seems to be a vasovagal episode with the patient stood up when he started becoming lightheaded.  I recommended to the patient that if this happens again to make sure lie down and stay still.  Recommended that he stay hydrated and follow-up with his cardiologist, Dr. Nehemiah Massed.  He is understanding the plan willing to comply. ____________________________________________   FINAL CLINICAL IMPRESSION(S) / ED DIAGNOSES  Syncope.  Head injury.  NEW MEDICATIONS STARTED DURING THIS VISIT:  New Prescriptions   No medications on file     Note:  This document was prepared using Dragon voice recognition software and may include unintentional dictation errors.     Orbie Pyo, MD 07/29/18 [phone]

## 2018-07-29 NOTE — ED Triage Notes (Signed)
Pt arrived via POV from home with reports of fall, pt states he felt hot and was trying to turn on the fan to cool and and fell face first into the wall and into some flower pots.  Pt reports he did black out. Pt denies any pain at this time.  Pt has ecchymosis and small abrasions to left eye and orbit.  Denies any difficulty with vision.

## 2018-07-29 NOTE — ED Notes (Signed)
Pt unable to provide urine sample at this moment

## 2018-08-11 ENCOUNTER — Ambulatory Visit: Payer: Medicare Other

## 2018-08-12 ENCOUNTER — Ambulatory Visit: Payer: Medicare Other

## 2018-08-13 ENCOUNTER — Ambulatory Visit: Payer: Medicare Other | Admitting: Family Medicine

## 2018-09-19 ENCOUNTER — Telehealth: Payer: Self-pay | Admitting: Family Medicine

## 2018-09-19 DIAGNOSIS — E1122 Type 2 diabetes mellitus with diabetic chronic kidney disease: Secondary | ICD-10-CM

## 2018-09-19 DIAGNOSIS — E039 Hypothyroidism, unspecified: Secondary | ICD-10-CM

## 2018-09-19 DIAGNOSIS — E7800 Pure hypercholesterolemia, unspecified: Secondary | ICD-10-CM

## 2018-09-19 DIAGNOSIS — I10 Essential (primary) hypertension: Secondary | ICD-10-CM

## 2018-09-19 DIAGNOSIS — N183 Chronic kidney disease, stage 3 (moderate): Secondary | ICD-10-CM

## 2018-09-19 DIAGNOSIS — Z794 Long term (current) use of insulin: Secondary | ICD-10-CM

## 2018-09-19 NOTE — Telephone Encounter (Signed)
-----   Message from Eustace Pen, LPN sent at 4/38/3779  4:40 PM EDT ----- Regarding: Labs 10/4 Lab orders needed. Thank you.  Insurance:  [facility] Medicare

## 2018-09-24 ENCOUNTER — Ambulatory Visit (INDEPENDENT_AMBULATORY_CARE_PROVIDER_SITE_OTHER): Payer: Medicare Other | Admitting: Family Medicine

## 2018-09-24 ENCOUNTER — Ambulatory Visit (INDEPENDENT_AMBULATORY_CARE_PROVIDER_SITE_OTHER): Payer: Medicare Other

## 2018-09-24 ENCOUNTER — Encounter: Payer: Self-pay | Admitting: Family Medicine

## 2018-09-24 VITALS — BP 130/70 | HR 66 | Temp 97.5°F | Ht 68.0 in | Wt 162.5 lb

## 2018-09-24 DIAGNOSIS — I10 Essential (primary) hypertension: Secondary | ICD-10-CM

## 2018-09-24 DIAGNOSIS — E1122 Type 2 diabetes mellitus with diabetic chronic kidney disease: Secondary | ICD-10-CM

## 2018-09-24 DIAGNOSIS — Z794 Long term (current) use of insulin: Secondary | ICD-10-CM | POA: Diagnosis not present

## 2018-09-24 DIAGNOSIS — N289 Disorder of kidney and ureter, unspecified: Secondary | ICD-10-CM

## 2018-09-24 DIAGNOSIS — E039 Hypothyroidism, unspecified: Secondary | ICD-10-CM | POA: Diagnosis not present

## 2018-09-24 DIAGNOSIS — N183 Chronic kidney disease, stage 3 unspecified: Secondary | ICD-10-CM

## 2018-09-24 DIAGNOSIS — Z0000 Encounter for general adult medical examination without abnormal findings: Secondary | ICD-10-CM | POA: Diagnosis not present

## 2018-09-24 DIAGNOSIS — E7800 Pure hypercholesterolemia, unspecified: Secondary | ICD-10-CM | POA: Diagnosis not present

## 2018-09-24 LAB — COMPREHENSIVE METABOLIC PANEL
ALBUMIN: 4.1 g/dL (ref 3.5–5.2)
ALK PHOS: 68 U/L (ref 39–117)
ALT: 24 U/L (ref 0–53)
AST: 22 U/L (ref 0–37)
BUN: 31 mg/dL — ABNORMAL HIGH (ref 6–23)
CALCIUM: 9.6 mg/dL (ref 8.4–10.5)
CHLORIDE: 108 meq/L (ref 96–112)
CO2: 29 mEq/L (ref 19–32)
Creatinine, Ser: 1.91 mg/dL — ABNORMAL HIGH (ref 0.40–1.50)
GFR: 35.34 mL/min — AB (ref >60.00)
Glucose, Bld: 162 mg/dL — ABNORMAL HIGH (ref 70–99)
POTASSIUM: 4.8 meq/L (ref 3.5–5.1)
SODIUM: 140 meq/L (ref 135–145)
Total Bilirubin: 0.7 mg/dL (ref 0.2–1.2)
Total Protein: 6.8 g/dL (ref 6.0–8.3)

## 2018-09-24 LAB — CBC WITH DIFFERENTIAL/PLATELET
BASOS PCT: 0.7 % (ref 0.0–3.0)
Basophils Absolute: 0.1 10*3/uL (ref 0.0–0.1)
EOS PCT: 3.2 % (ref 0.0–5.0)
Eosinophils Absolute: 0.3 10*3/uL (ref 0.0–0.7)
HEMATOCRIT: 38.9 % — AB (ref 39.0–52.0)
HEMOGLOBIN: 13 g/dL (ref 13.0–17.0)
LYMPHS PCT: 22.9 % (ref 12.0–46.0)
Lymphs Abs: 1.8 10*3/uL (ref 0.7–4.0)
MCHC: 33.5 g/dL (ref 30.0–36.0)
MCV: 101.4 fl — ABNORMAL HIGH (ref 78.0–100.0)
Monocytes Absolute: 0.7 10*3/uL (ref 0.1–1.0)
Monocytes Relative: 8.6 % (ref 3.0–12.0)
Neutro Abs: 5.2 10*3/uL (ref 1.4–7.7)
Neutrophils Relative %: 64.6 % (ref 43.0–77.0)
Platelets: 233 10*3/uL (ref 150.0–400.0)
RBC: 3.83 Mil/uL — AB (ref 4.22–5.81)
RDW: 13.8 % (ref 11.5–15.5)
WBC: 8 10*3/uL (ref 4.0–10.5)

## 2018-09-24 LAB — HEMOGLOBIN A1C: Hgb A1c MFr Bld: 6.4 % (ref 4.6–6.5)

## 2018-09-24 LAB — LIPID PANEL
CHOL/HDL RATIO: 3
Cholesterol: 112 mg/dL (ref 0–200)
HDL: 40.5 mg/dL (ref >39.00)
LDL CALC: 56 mg/dL (ref 0–99)
NonHDL: 71.54
TRIGLYCERIDES: 77 mg/dL (ref 0.0–149.0)
VLDL: 15.4 mg/dL (ref 0.0–40.0)

## 2018-09-24 LAB — TSH: TSH: 4.07 u[IU]/mL (ref 0.35–4.50)

## 2018-09-24 MED ORDER — LEVOTHYROXINE SODIUM 25 MCG PO TABS
25.0000 ug | ORAL_TABLET | Freq: Every day | ORAL | 3 refills | Status: DC
Start: 2018-09-24 — End: 2019-06-28

## 2018-09-24 MED ORDER — GLIMEPIRIDE 4 MG PO TABS: 4.0000 mg | ORAL_TABLET | Freq: Two times a day (BID) | ORAL | 3 refills | Status: DC

## 2018-09-24 NOTE — Progress Notes (Signed)
Subjective:    Patient ID: David Henry, male    DOB: September 10, 1926, 84 y.o.   MRN: [id number]  HPI Here for f/u of chronic medical problems   Was in ED with syncopal episode 8/19  Thought to be vasovagal  A similar episode working out in the heat  Had nl cardiac work up for this -re assuring   Had amw today  Missed highest hearing tone  Does not bother him   Wt Readings from Last 3 Encounters:  09/24/18 162 lb 8 oz (73.7 kg)  09/24/18 162 lb 8 oz (73.7 kg)  07/29/18 155 lb (70.3 kg)  stable weight  24.71 kg/m   bp is stable today  No cp or palpitations or headaches or edema  No side effects to medicines  BP Readings from Last 3 Encounters:  09/24/18 130/70  09/24/18 130/70  07/29/18 (!) 149/66     Diabetes Home sugar results - sometimes runs low (then he cuts lantus to 15 instead of 20) No hypoglycemia symptoms  Lowest reading was 60  Usually below 100 in the am  Has not taken during the day DM diet -does not eat as much as he used to  Eating healthy  Exercise -works a lot outdoors  Symptoms-none  A1C last  Lab Results  Component Value Date   HGBA1C 6.8 (H) 06/18/2018  pending lab from this am   No problems with medications  Renal protection-lisinopril  Last eye exam  -has appt this month lantus  amaryl januvia  Had his flu shot   Renal insuff Lab Results  Component Value Date   CREATININE 1.80 (H) 07/29/2018   BUN 29 (H) 07/29/2018   NA 141 07/29/2018   K 4.8 07/29/2018   CL 110 07/29/2018   CO2 25 07/29/2018  saw renal in may Stopped his amlodipine Also stopped calcium as well   Lab Results  Component Value Date   WBC 10.7 (H) 07/29/2018   HGB 13.0 07/29/2018   HCT 37.7 (L) 07/29/2018   MCV 100.8 (H) 07/29/2018   PLT 210 07/29/2018   stable but wbc up in aug in ED Pending re check today  Patient Active Problem List   Diagnosis Date Noted  . Pre-op examination 12/14/2017  . Routine general medical examination at a health care  facility 03/09/2016  . Constipation 09/17/2015  . Encounter for Medicare annual wellness exam 12/08/2014  . Hematuria, microscopic 05/26/2013  . Renal insufficiency 05/24/2013  . Anemia 11/23/2012  . Hypothyroid 11/23/2012  . GERD 11/29/2010  . Coronary atherosclerosis 04/18/2007  . Controlled type 2 diabetes mellitus with chronic kidney disease ([place]) 04/15/2007  . Hyperlipidemia 04/15/2007  . Essential hypertension 04/15/2007  . MYOCARDIAL INFARCTION, HX OF 04/15/2007  . ACTINIC KERATOSIS 04/15/2007   Past Medical History:  Diagnosis Date  . Arthritis   . CAD (coronary artery disease)    3 stents  . Colon polyp   . Diabetes mellitus    type II  . Dyspnea    with exertion  . GERD (gastroesophageal reflux disease)   . Hyperlipidemia   . Hypertension   . Hypothyroidism   . Kidney stones    stage III CKD  . Neuropathy   . Peripheral vascular disease ([facility])    neuropathy in feet   Past Surgical History:  Procedure Laterality Date  . APPENDECTOMY  1984  . CATARACT EXTRACTION Bilateral 04/2001  . CHOLECYSTECTOMY  1989  . COLONOSCOPY N/A 2/14   hemorrhoids and tics (after  pos ifob card)  . CORONARY ANGIOPLASTY  2006   3 stents  . ESOPHAGOGASTRODUODENOSCOPY N/A 2/14  . RETINAL DETACHMENT SURGERY Right 2003  . SHOULDER ARTHROSCOPY WITH ROTATOR CUFF REPAIR AND SUBACROMIAL DECOMPRESSION Right 12/30/2017   Procedure: SHOULDER ARTHROSCOPY WITH ROTATOR CUFF REPAIR AND SUBACROMIAL DECOMPRESSION;  Surgeon: Leanor Kail, MD;  Location: [facility] ORS;  Service: Orthopedics;  Laterality: Right;  . SHOULDER SURGERY  2012   Lt shoulder repair   Social History   Tobacco Use  . Smoking status: Never Smoker  . Smokeless tobacco: Never Used  Substance Use Topics  . Alcohol use: No    Alcohol/week: 0.0 standard drinks  . Drug use: No   Family History  Problem Relation Age of Onset  . Cancer Brother        prostate CA  . Heart disease Mother   . Stroke Father   . Kidney disease  Sister   . Kidney disease Sister   . Clotting disorder Sister   . COPD Brother   . Heart disease Brother    Allergies  Allergen Reactions  . Adhesive [Tape] Dermatitis    Skin is thin so it is tough to take off.  Use paper tape  . Azithromycin Nausea Only    REACTION: nausea  . Celecoxib Other (See Comments)    Raises blood pressure  . Zocor [Simvastatin] Other (See Comments)    Muscle pain   Current Outpatient Medications on File Prior to Visit  Medication Sig Dispense Refill  . acetaminophen (TYLENOL ARTHRITIS PAIN) 650 MG CR tablet Take 650 mg by mouth every 8 (eight) hours as needed.      Marland Kitchen aspirin 81 MG tablet Take 81 mg by mouth daily.      . BD PEN NEEDLE NANO U/F 32G X 4 MM MISC USE AS DIRECTED WITH LANTUS 100 each 2  . glucose blood (ONE TOUCH ULTRA TEST) test strip CHECK BLOOD SUGAR TWICE DAILY AND AS DIRECTED FROM DM (DX. E11.22, N18.3, Z79.4) 200 each 1  . Insulin Glargine (LANTUS SOLOSTAR) 100 UNIT/ML Solostar Pen INJECT 20 UNITS INTO THE SKIN EVERY MORNING (Patient taking differently: Inject 15 Units into the skin daily. INJECT 20 UNITS INTO THE SKIN EVERY MORNING) 15 mL 5  . JANUVIA 50 MG tablet TAKE 1 TABLET BY MOUTH ONCE A DAY 90 tablet 1  . lisinopril (PRINIVIL,ZESTRIL) 10 MG tablet TAKE 1 TABLET BY MOUTH ONCE A DAY 90 tablet 1  . Multiple Vitamin (MULTIVITAMIN) capsule Take 1 capsule by mouth daily.      . NON FORMULARY 100 BC ultra fine needles    . Omega-3 Fatty Acids (FISH OIL PO) Take 2 capsules by mouth daily.      . ranitidine (ZANTAC) 150 MG capsule Take 150 mg by mouth daily as needed for heartburn.    . rosuvastatin (CRESTOR) 5 MG tablet TAKE 1 TABLET BY MOUTH EVERY OTHER DAY 45 tablet 3  . senna (SENOKOT) 8.6 MG tablet Take 1 tablet by mouth 2 (two) times daily.    . tamsulosin (FLOMAX) 0.4 MG CAPS capsule TAKE 1 CAPSULE BY MOUTH DAILY 90 capsule 3  . [DISCONTINUED] omeprazole (PRILOSEC OTC) 20 MG tablet Take 1 tablet (20 mg total) by mouth daily. 90  tablet 1   No current facility-administered medications on file prior to visit.      Review of Systems  Constitutional: Negative for activity change, appetite change, fatigue, fever and unexpected weight change.  HENT: Negative for congestion, rhinorrhea, sore throat  and trouble swallowing.   Eyes: Negative for pain, redness, itching and visual disturbance.  Respiratory: Negative for cough, chest tightness, shortness of breath and wheezing.   Cardiovascular: Negative for chest pain, palpitations and leg swelling.       No leg swelling off of amlodipine  Gastrointestinal: Negative for abdominal pain, blood in stool, constipation, diarrhea and nausea.  Endocrine: Negative for cold intolerance, heat intolerance, polydipsia and polyuria.  Genitourinary: Negative for difficulty urinating, dysuria, frequency and urgency.  Musculoskeletal: Negative for arthralgias, joint swelling and myalgias.  Skin: Negative for pallor and rash.  Neurological: Negative for dizziness, tremors, weakness, numbness and headaches.       No further episodes of syncope  Being more careful not to work in the heat   Hematological: Negative for adenopathy. Does not bruise/bleed easily.  Psychiatric/Behavioral: Negative for decreased concentration and dysphoric mood. The patient is not nervous/anxious.        Objective:   Physical Exam  Constitutional: He appears well-developed and well-nourished. No distress.  Slim and well appearing   HENT:  Head: Normocephalic and atraumatic.  Mouth/Throat: Oropharynx is clear and moist.  Eyes: Pupils are equal, round, and reactive to light. Conjunctivae and EOM are normal.  Neck: Normal range of motion. Neck supple. No JVD present. Carotid bruit is not present. No thyromegaly present.  Cardiovascular: Normal rate, regular rhythm, normal heart sounds and intact distal pulses. Exam reveals no gallop.  Pulmonary/Chest: Effort normal and breath sounds normal. No respiratory  distress. He has no wheezes. He has no rales.  No crackles  Abdominal: Soft. Bowel sounds are normal. He exhibits no distension, no abdominal bruit and no mass. There is no tenderness.  Musculoskeletal: He exhibits no edema, tenderness or deformity.  Lymphadenopathy:    He has no cervical adenopathy.  Neurological: He is alert. He has normal reflexes. He displays normal reflexes. No cranial nerve deficit. He exhibits normal muscle tone. Coordination normal.  Nl gait /balance is not impaired   Skin: Skin is warm and dry. No rash noted.  Psychiatric: He has a normal mood and affect.  Pleasant with good mood          Assessment & Plan:   Problem List Items Addressed This Visit      Cardiovascular and Mediastinum   Essential hypertension - Primary    bp in fair control at this time  BP Readings from Last 1 Encounters:  09/24/18 130/70   No changes needed Most recent labs reviewed  Disc lifstyle change with low sodium diet and exercise  Off amlodipine Watched by renal         Endocrine   Controlled type 2 diabetes mellitus with chronic kidney disease ([place])    Some occ low glucose levels (has been more active)  Adv to cut lantus by 5 u if needed and do not skip meals A1C today  Doing well with diet  Will ask opthy to send [place] a copy of DM eye exam report       Relevant Medications   glimepiride (AMARYL) 4 MG tablet     Genitourinary   Renal insufficiency    Multifactorial Seeing renal Controlling HTN and DM  Amlodipine and ca was stopped  Adv strongly again not to take nsaids Keep up a good water intake

## 2018-09-24 NOTE — Progress Notes (Signed)
Subjective:   David Henry is a 84 y.o. male who presents for Medicare Annual (Subsequent) preventive examination.  Review of Systems:  N/A Cardiac Risk Factors include: advanced age (>84men, >60 women);diabetes mellitus;dyslipidemia;hypertension;male gender     Objective:     Vitals: BP 130/70 (BP Location: Right Arm, Patient Position: Sitting, Cuff Size: Normal)   Pulse 66   Temp (!) 97.5 F (36.4 C) (Oral)   Ht 5\' 8"  (1.727 m) Comment: shoes  Wt 162 lb 8 oz (73.7 kg)   SpO2 98%   BMI 24.71 kg/m   Body mass index is 24.71 kg/m.  Advanced Directives 09/24/2018 07/29/2018 12/29/2017 03/19/2017 03/18/2016  Does Patient Have a Medical Advance Directive? Yes Yes Yes Yes Yes  Type of Paramedic of [place];Living will [place];Living will [place];Living will [place];Living will [place];Living will  Does patient want to make changes to medical advance directive? - No - Patient declined - - No - Patient declined  Copy of [place] in Chart? No - copy requested - - No - copy requested No - copy requested  Would patient like information on creating a medical advance directive? - - No - Patient declined - -    Tobacco Social History   Tobacco Use  Smoking Status Never Smoker  Smokeless Tobacco Never Used     Counseling given: No   Clinical Intake:  Pre-visit preparation completed: Yes  Pain : No/denies pain Pain Score: 0-No pain     Nutritional Status: BMI 25 -29 Overweight Nutritional Risks: None Diabetes: Yes CBG done?: No Did pt. bring in CBG monitor from home?: No  How often do you need to have someone help you when you read instructions, pamphlets, or other written materials from your doctor or pharmacy?: 1 - Never What is the last grade level you completed in school?: 12th grade  Interpreter Needed?: No  Comments: pt lives with  spouse Information entered by :: LPinson, LPN  Past Medical History:  Diagnosis Date  . Arthritis   . CAD (coronary artery disease)    3 stents  . Colon polyp   . Diabetes mellitus    type II  . Dyspnea    with exertion  . GERD (gastroesophageal reflux disease)   . Hyperlipidemia   . Hypertension   . Hypothyroidism   . Kidney stones    stage III CKD  . Neuropathy   . Peripheral vascular disease (HCC)    neuropathy in feet   Past Surgical History:  Procedure Laterality Date  . APPENDECTOMY  1984  . CATARACT EXTRACTION Bilateral 04/2001  . CHOLECYSTECTOMY  1989  . COLONOSCOPY N/A 2/14   hemorrhoids and tics (after pos ifob card)  . CORONARY ANGIOPLASTY  2006   3 stents  . ESOPHAGOGASTRODUODENOSCOPY N/A 2/14  . RETINAL DETACHMENT SURGERY Right 2003  . SHOULDER ARTHROSCOPY WITH ROTATOR CUFF REPAIR AND SUBACROMIAL DECOMPRESSION Right 12/30/2017   Procedure: SHOULDER ARTHROSCOPY WITH ROTATOR CUFF REPAIR AND SUBACROMIAL DECOMPRESSION;  Surgeon: Leanor Kail, MD;  Location: [facility] ORS;  Service: Orthopedics;  Laterality: Right;  . SHOULDER SURGERY  2012   Lt shoulder repair   Family History  Problem Relation Age of Onset  . Cancer Brother        prostate CA  . Heart disease Mother   . Stroke Father   . Kidney disease Sister   . Kidney disease Sister   . Clotting  disorder Sister   . COPD Brother   . Heart disease Brother    Social History   Socioeconomic History  . Marital status: Married    Spouse name: Not on file  . Number of children: Not on file  . Years of education: Not on file  . Highest education level: Not on file  Occupational History  . Not on file  Social Needs  . Financial resource strain: Not on file  . Food insecurity:    Worry: Not on file    Inability: Not on file  . Transportation needs:    Medical: Not on file    Non-medical: Not on file  Tobacco Use  . Smoking status: Never Smoker  . Smokeless tobacco: Never Used  Substance and  Sexual Activity  . Alcohol use: No    Alcohol/week: 0.0 standard drinks  . Drug use: No  . Sexual activity: Never  Lifestyle  . Physical activity:    Days per week: Not on file    Minutes per session: Not on file  . Stress: Not on file  Relationships  . Social connections:    Talks on phone: Not on file    Gets together: Not on file    Attends religious service: Not on file    Active member of club or organization: Not on file    Attends meetings of clubs or organizations: Not on file    Relationship status: Not on file  Other Topics Concern  . Not on file  Social History Narrative  . Not on file    Outpatient Encounter Medications as of 09/24/2018  Medication Sig  . acetaminophen (TYLENOL ARTHRITIS PAIN) 650 MG CR tablet Take 650 mg by mouth every 8 (eight) hours as needed.    Marland Kitchen aspirin 81 MG tablet Take 81 mg by mouth daily.    . BD PEN NEEDLE NANO U/F 32G X 4 MM MISC USE AS DIRECTED WITH LANTUS  . glimepiride (AMARYL) 4 MG tablet TAKE 1 TABLET BY MOUTH EVERY MORNING ANDTAKE 1 TABLET BY MOUTH EVERY EVENING (Patient taking differently: Take 4 mg by mouth 2 (two) times daily. )  . glucose blood (ONE TOUCH ULTRA TEST) test strip CHECK BLOOD SUGAR TWICE DAILY AND AS DIRECTED FROM DM (DX. E11.22, N18.3, Z79.4)  . Insulin Glargine (LANTUS SOLOSTAR) 100 UNIT/ML Solostar Pen INJECT 20 UNITS INTO THE SKIN EVERY MORNING (Patient taking differently: Inject 15 Units into the skin daily. INJECT 20 UNITS INTO THE SKIN EVERY MORNING)  . JANUVIA 50 MG tablet TAKE 1 TABLET BY MOUTH ONCE A DAY  . levothyroxine (SYNTHROID, LEVOTHROID) 25 MCG tablet TAKE 1 TABLET BY MOUTH ONCE A DAY BEFOREBREAKFAST.  Marland Kitchen lisinopril (PRINIVIL,ZESTRIL) 10 MG tablet TAKE 1 TABLET BY MOUTH ONCE A DAY  . Multiple Vitamin (MULTIVITAMIN) capsule Take 1 capsule by mouth daily.    . NON FORMULARY 100 BC ultra fine needles  . Omega-3 Fatty Acids (FISH OIL PO) Take 2 capsules by mouth daily.    . ranitidine (ZANTAC) 150 MG  capsule Take 150 mg by mouth daily as needed for heartburn.  . rosuvastatin (CRESTOR) 5 MG tablet TAKE 1 TABLET BY MOUTH EVERY OTHER DAY  . senna (SENOKOT) 8.6 MG tablet Take 1 tablet by mouth 2 (two) times daily.  . tamsulosin (FLOMAX) 0.4 MG CAPS capsule TAKE 1 CAPSULE BY MOUTH DAILY  . [DISCONTINUED] omeprazole (PRILOSEC OTC) 20 MG tablet Take 1 tablet (20 mg total) by mouth daily.   No facility-administered encounter  medications on file as of 09/24/2018.     Activities of Daily Living In your present state of health, do you have any difficulty performing the following activities: 09/24/2018 12/30/2017  Hearing? N N  Vision? N N  Difficulty concentrating or making decisions? Y N  Walking or climbing stairs? N Y  Dressing or bathing? N N  Comment - -  Doing errands, shopping? [place] and eating ? N -  Using the Toilet? N -  In the past six months, have you accidently leaked urine? N -  Do you have problems with loss of bowel control? N -  Managing your Medications? N -  Managing your Finances? N -  Housekeeping or managing your Housekeeping? N -  Some recent data might be hidden    Patient Care Team: Tower, Wynelle Fanny, MD as PCP - General (Family Medicine) Leandrew Koyanagi, MD as Consulting Physician (Ophthalmology) Corey Skains, MD as Consulting Physician (Cardiology) Murlean Iba, MD as Consulting Physician (Internal Medicine)    Assessment:   This is a routine wellness examination for [facility].   Hearing Screening   125Hz  250Hz  500Hz  1000Hz  2000Hz  3000Hz  4000Hz  6000Hz  8000Hz   Right ear:   40 40 40  0    Left ear:   40 40 40  0    Vision Screening Comments: Vision exam scheduled Oct 2019    Exercise Activities and Dietary recommendations Current Exercise Habits: The patient does not participate in regular exercise at present, Exercise limited by: None identified  Goals    . Increase physical activity     When weather permits, I resume  yard work for at least 60 minutes once weekly.        Fall Risk Fall Risk  09/24/2018 06/28/2018 03/19/2017 03/18/2016 03/18/2016  Falls in the past year? Yes No No No No  Comment pt reports dizziness and LOC with subsequent fall; treatment in ER (8/19) - - - -  Number falls in past yr: 1 - - - -  Injury with Fall? Yes - - - -  Risk Factor Category  High Fall Risk - - - -  Risk for fall due to : Impaired balance/gait - - - -   Depression Screen PHQ 2/9 Scores 09/24/2018 06/28/2018 03/19/2017 03/18/2016  PHQ - 2 Score 0 0 0 0  PHQ- 9 Score 0 - - -     Cognitive Function MMSE - Mini Mental State Exam 09/24/2018 03/19/2017 03/18/2016  Orientation to time 5 5 5   Orientation to Place 5 5 5   Registration 3 3 3   Attention/ Calculation 0 0 0  Recall 3 2 3   Recall-comments - pt was unable to recall 1 of 3 words -  Language- name 2 objects 0 0 0  Language- repeat 1 1 1   Language- follow 3 step command 3 3 3   Language- read & follow direction 0 0 0  Write a sentence 0 0 0  Copy design 0 0 0  Total score 20 19 20      PLEASE NOTE: A Mini-Cog screen was completed. Maximum score is 20. A value of 0 denotes this part of [place] MMSE was not completed or the patient failed this part of the Mini-Cog screening.   Mini-Cog Screening Orientation to Time - Max 5 pts Orientation to Place - Max 5 pts Registration - Max 3 pts Recall - Max 3 pts Language Repeat - Max 1 pts Language Follow 3 Step Command -  Max 3 pts     Immunization History  Administered Date(s) Administered  . H1N1 12/28/2008  . Influenza Split 10/23/2011, 09/15/2012  . Influenza Whole 09/22/1999, 10/01/2007, 10/02/2008, 09/27/2009, 10/09/2010  . Influenza, Quadrivalent, Recombinant, Inj, Pf 09/22/2017  . Influenza, Seasonal, Injecte, Preservative Fre 09/16/2018  . Influenza,inj,Quad PF,6+ Mos 09/13/2014, 09/17/2015, 09/12/2016  . Influenza-Unspecified 09/20/2013  . Pneumococcal Conjugate-13 12/08/2014  . Pneumococcal  Polysaccharide-23 07/23/2003  . Td 12/04/1999, 03/01/2008  . Tdap 05/20/2011, 07/29/2018  . Zoster 03/31/2015    Screening Tests Health Maintenance  Topic Date Due  . OPHTHALMOLOGY EXAM  03/16/2019 (Originally 12/04/2017)  . FOOT EXAM  03/16/2019  . HEMOGLOBIN A1C  03/26/2019  . TETANUS/TDAP  07/29/2028  . INFLUENZA VACCINE  Completed  . PNA vac Low Risk Adult  Completed      Plan:     I have personally reviewed, addressed, and noted the following in the patient's chart:  A. Medical and social history B. Use of alcohol, tobacco or illicit drugs  C. Current medications and supplements D. Functional ability and status E.  Nutritional status F.  Physical activity G. Advance directives H. List of other physicians I.  Hospitalizations, surgeries, and ER visits in previous 12 months J.  [place] to include hearing, vision, cognitive, depression L. Referrals and appointments - none  In addition, I have reviewed and discussed with patient certain preventive protocols, quality metrics, and best practice recommendations. A written personalized care plan for preventive services as well as general preventive health recommendations were provided to patient.  See attached scanned questionnaire for additional information.   Signed,   Lindell Noe, MHA, BS, LPN Health Coach

## 2018-09-24 NOTE — Patient Instructions (Signed)
Mr. Rosencrans , Thank you for taking time to come for your Medicare Wellness Visit. I appreciate your ongoing commitment to your health goals. Please review the following plan we discussed and let me know if I can assist you in the future.   These are the goals we discussed: Goals    . Increase physical activity     When weather permits, I resume yard work for at least 60 minutes once weekly.        This is a list of the screening recommended for you and due dates:  Health Maintenance  Topic Date Due  . Eye exam for diabetics  03/16/2019*  . Complete foot exam   03/16/2019  . Hemoglobin A1C  03/26/2019  . Tetanus Vaccine  07/29/2028  . Flu Shot  Completed  . Pneumonia vaccines  Completed  *Topic was postponed. The date shown is not the original due date.   Preventive Care for Adults  A healthy lifestyle and preventive care can promote health and wellness. Preventive health guidelines for adults include the following key practices.  . A routine yearly physical is a good way to check with your health care provider about your health and preventive screening. It is a chance to share any concerns and updates on your health and to receive a thorough exam.  . Visit your dentist for a routine exam and preventive care every 6 months. Brush your teeth twice a day and floss once a day. Good oral hygiene prevents tooth decay and gum disease.  . The frequency of eye exams is based on your age, health, family medical history, use  of contact lenses, and other factors. Follow your health care provider's recommendations for frequency of eye exams.  . Eat a healthy diet. Foods like vegetables, fruits, whole grains, low-fat dairy products, and lean protein foods contain the nutrients you need without too many calories. Decrease your intake of foods high in solid fats, added sugars, and salt. Eat the right amount of calories for you. Get information about a proper diet from your health care provider, if  necessary.  . Regular physical exercise is one of the most important things you can do for your health. Most adults should get at least 150 minutes of moderate-intensity exercise (any activity that increases your heart rate and causes you to sweat) each week. In addition, most adults need muscle-strengthening exercises on 2 or more days a week.  Silver Sneakers may be a benefit available to you. To determine eligibility, you may visit the website: www.silversneakers.com or contact program at 253-117-6275 Mon-Fri between 8AM-8PM.   . Maintain a healthy weight. The body mass index (BMI) is a screening tool to identify possible weight problems. It provides an estimate of body fat based on height and weight. Your health care provider can find your BMI and can help you achieve or maintain a healthy weight.   For adults 20 years and older: ? A BMI below 18.5 is considered underweight. ? A BMI of 18.5 to 24.9 is normal. ? A BMI of 25 to 29.9 is considered overweight. ? A BMI of 30 and above is considered obese.   . Maintain normal blood lipids and cholesterol levels by exercising and minimizing your intake of saturated fat. Eat a balanced diet with plenty of fruit and vegetables. Blood tests for lipids and cholesterol should begin at age 42 and be repeated every 5 years. If your lipid or cholesterol levels are high, you are over 50, or you  are at high risk for heart disease, you may need your cholesterol levels checked more frequently. Ongoing high lipid and cholesterol levels should be treated with medicines if diet and exercise are not working.  . If you smoke, find out from your health care provider how to quit. If you do not use tobacco, please do not start.  . If you choose to drink alcohol, please do not consume more than 2 drinks per day. One drink is considered to be 12 ounces (355 mL) of beer, 5 ounces (148 mL) of wine, or 1.5 ounces (44 mL) of liquor.  . If you are 66-51 years old, ask your  health care provider if you should take aspirin to prevent strokes.  . Use sunscreen. Apply sunscreen liberally and repeatedly throughout the day. You should seek shade when your shadow is shorter than you. Protect yourself by wearing long sleeves, pants, a wide-brimmed hat, and sunglasses year round, whenever you are outdoors.  . Once a month, do a whole body skin exam, using a mirror to look at the skin on your back. Tell your health care provider of new moles, moles that have irregular borders, moles that are larger than a pencil eraser, or moles that have changed in shape or color.

## 2018-09-24 NOTE — Patient Instructions (Addendum)
Stay off of ladders  Also make sure to keep up fluids Take breaks when working outside- avoid the heat   Start checking some blood glucose levels during the day (2 hours after eating)   When you get your eyes checked - please ask them to send [place] a copy of your diabetic eye exam   Keep eating a healthy diet   Keep cutting back lantus if glucose is low   We will let you know when labs return   Avoid any pain medicine but tylenol  Avoid aleve or ibuprofen - those are very bad for kidney function

## 2018-09-24 NOTE — Progress Notes (Signed)
PCP notes:   Health maintenance:  A1C - completed  Abnormal screenings:   Hearing - failed  Hearing Screening   125Hz  250Hz  500Hz  1000Hz  2000Hz  3000Hz  4000Hz  6000Hz  8000Hz   Right ear:   40 40 40  0    Left ear:   40 40 40  0    Vision Screening Comments: Vision exam scheduled Oct 2019  Fall risk - hx of single fall (Note: encounter in [place]) Fall Risk  09/24/2018 06/28/2018 03/19/2017 03/18/2016 03/18/2016  Falls in the past year? Yes No No No No  Comment pt reports dizziness and LOC with subsequent fall; treatment in ER (8/19) - - - -  Number falls in past yr: 1 - - - -  Injury with Fall? Yes - - - -  Risk Factor Category  High Fall Risk - - - -  Risk for fall due to : Impaired balance/gait - - - -   Patient concerns:   None  Nurse concerns:  None  Next PCP appt:   09/24/2018 @ 1430  I reviewed health advisor's note, was available for consultation, and agree with documentation and plan. Loura Pardon MD

## 2018-09-26 NOTE — Assessment & Plan Note (Signed)
bp in fair control at this time  BP Readings from Last 1 Encounters:  09/24/18 130/70   No changes needed Most recent labs reviewed  Disc lifstyle change with low sodium diet and exercise  Off amlodipine Watched by renal

## 2018-09-26 NOTE — Assessment & Plan Note (Signed)
Multifactorial Seeing renal Controlling HTN and DM  Amlodipine and ca was stopped  Adv strongly again not to take nsaids Keep up a good water intake

## 2018-09-26 NOTE — Assessment & Plan Note (Signed)
Some occ low glucose levels (has been more active)  Adv to cut lantus by 5 u if needed and do not skip meals A1C today  Doing well with diet  Will ask opthy to send [place] a copy of DM eye exam report

## 2018-10-05 LAB — HM DIABETES EYE EXAM

## 2018-10-12 ENCOUNTER — Other Ambulatory Visit: Payer: Self-pay | Admitting: Family Medicine

## 2018-10-14 ENCOUNTER — Encounter: Payer: Self-pay | Admitting: Family Medicine

## 2018-11-22 ENCOUNTER — Telehealth: Payer: Self-pay

## 2018-11-22 DIAGNOSIS — K219 Gastro-esophageal reflux disease without esophagitis: Secondary | ICD-10-CM

## 2018-11-22 MED ORDER — PANTOPRAZOLE SODIUM 40 MG PO TBEC: 40.0000 mg | DELAYED_RELEASE_TABLET | Freq: Every day | ORAL | 3 refills | Status: DC

## 2018-11-22 NOTE — Telephone Encounter (Signed)
I sent it  Alert me if no improvement

## 2018-11-22 NOTE — Telephone Encounter (Signed)
Pt's wife (DPR not signed) request to change from Prilosec which is not helping symptoms now to pantoprazole 40 mg to [facility].Please advise.

## 2018-11-22 NOTE — Telephone Encounter (Signed)
Pt's wife notified Rx sent to pharmacy and to let us know if no improvement

## 2018-11-22 NOTE — Assessment & Plan Note (Signed)
Omeprazole no longer working Wants to try protonix

## 2018-12-27 ENCOUNTER — Telehealth: Payer: Self-pay | Admitting: *Deleted

## 2018-12-27 NOTE — Telephone Encounter (Signed)
Pt's wife called asking to speak directly to me "Dr. Marliss Coots nurse". Spoke with pt's wife and she advise me that she got a bill from the [facility] on 09/24/18 and billing couldn't tell her why pt has a bill from his CPE/AWV that day, so she would like someone from our office to look into it for her. I advise pt's wife that I don't handle billing at all and that I would have Jeani Hawking call her back to discuss issues

## 2018-12-28 NOTE — Telephone Encounter (Signed)
Called and spoke with pt and his wife. I have sent to coding for review.

## 2018-12-30 ENCOUNTER — Telehealth: Payer: Self-pay | Admitting: Family Medicine

## 2018-12-30 NOTE — Telephone Encounter (Signed)
I attempted to call pt to r/s 01/04/19 appt due to provider being out of office. I was unable to leave msg, no vm set up.

## 2019-01-04 ENCOUNTER — Other Ambulatory Visit: Payer: Self-pay | Admitting: Family Medicine

## 2019-01-04 NOTE — Telephone Encounter (Signed)
This is the response from coding- It was documented as follow up on HTN, DM and Renal-does not support me changing to CPE.

## 2019-01-04 NOTE — Telephone Encounter (Signed)
I spoke with [name] and Marcello Moores and they were very unhappy and disagreed with the charge but Kalijah said he will pay.

## 2019-03-21 ENCOUNTER — Other Ambulatory Visit: Payer: Self-pay | Admitting: Family Medicine

## 2019-03-27 ENCOUNTER — Telehealth: Payer: Self-pay | Admitting: Family Medicine

## 2019-03-27 DIAGNOSIS — I10 Essential (primary) hypertension: Secondary | ICD-10-CM

## 2019-03-27 DIAGNOSIS — Z794 Long term (current) use of insulin: Secondary | ICD-10-CM

## 2019-03-27 DIAGNOSIS — N183 Chronic kidney disease, stage 3 unspecified: Secondary | ICD-10-CM

## 2019-03-27 DIAGNOSIS — E1122 Type 2 diabetes mellitus with diabetic chronic kidney disease: Secondary | ICD-10-CM

## 2019-03-27 DIAGNOSIS — E7800 Pure hypercholesterolemia, unspecified: Secondary | ICD-10-CM

## 2019-03-27 NOTE — Telephone Encounter (Signed)
-----   Message from Cloyd Stagers, RT sent at 03/23/2019  2:16 PM EDT ----- Regarding: lab orders 4.6.20 Lab orders please for 9mo f/u, office visit on 4.8.20 Thanks,  Dyke Maes RT(R)

## 2019-03-28 ENCOUNTER — Other Ambulatory Visit: Payer: Self-pay

## 2019-03-28 ENCOUNTER — Other Ambulatory Visit (INDEPENDENT_AMBULATORY_CARE_PROVIDER_SITE_OTHER): Payer: Medicare Other

## 2019-03-28 DIAGNOSIS — E1122 Type 2 diabetes mellitus with diabetic chronic kidney disease: Secondary | ICD-10-CM

## 2019-03-28 DIAGNOSIS — Z794 Long term (current) use of insulin: Secondary | ICD-10-CM

## 2019-03-28 DIAGNOSIS — N183 Chronic kidney disease, stage 3 (moderate): Secondary | ICD-10-CM | POA: Diagnosis not present

## 2019-03-28 DIAGNOSIS — E7800 Pure hypercholesterolemia, unspecified: Secondary | ICD-10-CM

## 2019-03-28 DIAGNOSIS — I10 Essential (primary) hypertension: Secondary | ICD-10-CM | POA: Diagnosis not present

## 2019-03-28 LAB — LIPID PANEL
Cholesterol: 117 mg/dL (ref 0–200)
HDL: 41.8 mg/dL (ref >39.00)
LDL Cholesterol: 59 mg/dL (ref 0–99)
NonHDL: 75.26
Total CHOL/HDL Ratio: 3
Triglycerides: 79 mg/dL (ref 0.0–149.0)
VLDL: 15.8 mg/dL (ref 0.0–40.0)

## 2019-03-28 LAB — COMPREHENSIVE METABOLIC PANEL
ALT: 25 U/L (ref 0–53)
AST: 21 U/L (ref 0–37)
Albumin: 4 g/dL (ref 3.5–5.2)
Alkaline Phosphatase: 76 U/L (ref 39–117)
BUN: 27 mg/dL — ABNORMAL HIGH (ref 6–23)
CO2: 27 mEq/L (ref 19–32)
Calcium: 9.6 mg/dL (ref 8.4–10.5)
Chloride: 108 mEq/L (ref 96–112)
Creatinine, Ser: 1.86 mg/dL — ABNORMAL HIGH (ref 0.40–1.50)
GFR: 34.25 mL/min — ABNORMAL LOW (ref >60.00)
Glucose, Bld: 60 mg/dL — ABNORMAL LOW (ref 70–99)
Potassium: 4.3 mEq/L (ref 3.5–5.1)
Sodium: 143 mEq/L (ref 135–145)
Total Bilirubin: 0.7 mg/dL (ref 0.2–1.2)
Total Protein: 6.5 g/dL (ref 6.0–8.3)

## 2019-03-28 LAB — HEMOGLOBIN A1C: Hgb A1c MFr Bld: 7 % — ABNORMAL HIGH (ref 4.6–6.5)

## 2019-03-30 ENCOUNTER — Ambulatory Visit (INDEPENDENT_AMBULATORY_CARE_PROVIDER_SITE_OTHER): Payer: Medicare Other | Admitting: Family Medicine

## 2019-03-30 ENCOUNTER — Encounter: Payer: Self-pay | Admitting: Family Medicine

## 2019-03-30 DIAGNOSIS — N183 Chronic kidney disease, stage 3 unspecified: Secondary | ICD-10-CM

## 2019-03-30 DIAGNOSIS — Z794 Long term (current) use of insulin: Secondary | ICD-10-CM

## 2019-03-30 DIAGNOSIS — N289 Disorder of kidney and ureter, unspecified: Secondary | ICD-10-CM

## 2019-03-30 DIAGNOSIS — E1122 Type 2 diabetes mellitus with diabetic chronic kidney disease: Secondary | ICD-10-CM

## 2019-03-30 DIAGNOSIS — I10 Essential (primary) hypertension: Secondary | ICD-10-CM

## 2019-03-30 DIAGNOSIS — E7800 Pure hypercholesterolemia, unspecified: Secondary | ICD-10-CM

## 2019-03-30 NOTE — Assessment & Plan Note (Signed)
Disc goals for lipids and reasons to control them Rev last labs with pt Rev low sat fat diet in detail Well controlled with statin

## 2019-03-30 NOTE — Progress Notes (Signed)
Virtual Visit via Telephone Note  I connected with David Henry on 03/30/19 at  9:00 AM EDT by telephone and verified that I am speaking with the correct person using two identifiers.   I discussed the limitations, risks, security and privacy concerns of performing an evaluation and management service by telephone and the availability of in person appointments. I also discussed with the patient that there may be a patient responsible charge related to this service. The patient expressed understanding and agreed to proceed.   History of Present Illness: Here for f/u of chronic health problems   Staying home due to covid  Getting outdoors at his home  He is staying pretty active   Home weight -staying the same  Last bmi was 24.7  bp is stable today  No cp or palpitations or headaches or edema  No side effects to medicines  BP Readings from Last 3 Encounters:  09/24/18 130/70  09/24/18 130/70  07/29/18 (!) 149/66    bp was 130/70 at cardiology office 03/02/19 On lisinopril 20 mg daily  No low blood pressures No cp  Or sob  No edema     Lab Results  Component Value Date   CREATININE 1.86 (H) 03/28/2019   BUN 27 (H) 03/28/2019   NA 143 03/28/2019   K 4.3 03/28/2019   CL 108 03/28/2019   CO2 27 03/28/2019  cr is stable   Lab Results  Component Value Date   ALT 25 03/28/2019   AST 21 03/28/2019   ALKPHOS 76 03/28/2019   BILITOT 0.7 03/28/2019   he drinks a lot of water  When working outdoors gets extra    DM2 Lab Results  Component Value Date   HGBA1C 7.0 (H) 03/28/2019  up from 6.4 Now he is home all the time  SIL goes to the store/grocery for him - has not been eating differently   Had low blood sugar 1-2 times but it has been quite stable (happens depending on what he eats)  Was 96 this am fasting (70-90 in am)  Eats a good supper at night   amaryl 4 mg bid lantus 20 u daily- some ams he does 15 instead of 20 if his glucose is on the lower side   januvia 50 mg  Lisinopril 20  Foot care- is good -no problems    Hyperlipidemia  Lab Results  Component Value Date   CHOL 117 03/28/2019   CHOL 112 09/24/2018   CHOL 108 06/18/2018   Lab Results  Component Value Date   HDL 41.80 03/28/2019   HDL 40.50 09/24/2018   HDL 38.40 (L) 06/18/2018   Lab Results  Component Value Date   LDLCALC 59 03/28/2019   LDLCALC 56 09/24/2018   LDLCALC 57 06/18/2018   Lab Results  Component Value Date   TRIG 79.0 03/28/2019   TRIG 77.0 09/24/2018   TRIG 64.0 06/18/2018   Lab Results  Component Value Date   CHOLHDL 3 03/28/2019   CHOLHDL 3 09/24/2018   CHOLHDL 3 06/18/2018   No results found for: LDLDIRECT  Overall very stable with statin   crestor 5 mg  In setting of h/o CAD  Slowing down with age a bit but doing well overall   Some sinus symptoms with pollen -pnd/ runny nose  Not taking anything  Does not think he needs anything   Review of Systems  Constitutional: Negative for chills, fever and weight loss.  HENT: Positive for hearing loss. Negative for congestion.  Eyes: Negative for blurred vision, discharge and redness.  Respiratory: Negative for cough, shortness of breath and wheezing.   Cardiovascular: Negative for chest pain, palpitations and leg swelling.  Gastrointestinal: Negative for diarrhea, nausea and vomiting.  Musculoskeletal: Negative for myalgias.  Skin: Negative for rash.  Neurological: Negative for dizziness and headaches.   Patient Active Problem List   Diagnosis Date Noted  . Pre-op examination 12/14/2017  . Routine general medical examination at a health care facility 03/09/2016  . Constipation 09/17/2015  . Encounter for Medicare annual wellness exam 12/08/2014  . Hematuria, microscopic 05/26/2013  . Renal insufficiency 05/24/2013  . Anemia 11/23/2012  . Hypothyroid 11/23/2012  . GERD 11/29/2010  . Coronary atherosclerosis 04/18/2007  . Controlled type 2 diabetes mellitus with chronic  kidney disease ([place]) 04/15/2007  . Hyperlipidemia 04/15/2007  . Essential hypertension 04/15/2007  . MYOCARDIAL INFARCTION, HX OF 04/15/2007  . ACTINIC KERATOSIS 04/15/2007   Past Medical History:  Diagnosis Date  . Arthritis   . CAD (coronary artery disease)    3 stents  . Colon polyp   . Diabetes mellitus    type II  . Dyspnea    with exertion  . GERD (gastroesophageal reflux disease)   . Hyperlipidemia   . Hypertension   . Hypothyroidism   . Kidney stones    stage III CKD  . Neuropathy   . Peripheral vascular disease ([facility])    neuropathy in feet   Past Surgical History:  Procedure Laterality Date  . APPENDECTOMY  1984  . CATARACT EXTRACTION Bilateral 04/2001  . CHOLECYSTECTOMY  1989  . COLONOSCOPY N/A 2/14   hemorrhoids and tics (after pos ifob card)  . CORONARY ANGIOPLASTY  2006   3 stents  . ESOPHAGOGASTRODUODENOSCOPY N/A 2/14  . RETINAL DETACHMENT SURGERY Right 2003  . SHOULDER ARTHROSCOPY WITH ROTATOR CUFF REPAIR AND SUBACROMIAL DECOMPRESSION Right 12/30/2017   Procedure: SHOULDER ARTHROSCOPY WITH ROTATOR CUFF REPAIR AND SUBACROMIAL DECOMPRESSION;  Surgeon: Leanor Kail, MD;  Location: [facility] ORS;  Service: Orthopedics;  Laterality: Right;  . SHOULDER SURGERY  2012   Lt shoulder repair   Social History   Tobacco Use  . Smoking status: Never Smoker  . Smokeless tobacco: Never Used  Substance Use Topics  . Alcohol use: No    Alcohol/week: 0.0 standard drinks  . Drug use: No   Family History  Problem Relation Age of Onset  . Cancer Brother        prostate CA  . Heart disease Mother   . Stroke Father   . Kidney disease Sister   . Kidney disease Sister   . Clotting disorder Sister   . COPD Brother   . Heart disease Brother    Allergies  Allergen Reactions  . Adhesive [Tape] Dermatitis    Skin is thin so it is tough to take off.  Use paper tape  . Azithromycin Nausea Only    REACTION: nausea  . Celecoxib Other (See Comments)    Raises blood  pressure  . Zocor [Simvastatin] Other (See Comments)    Muscle pain   Current Outpatient Medications on File Prior to Visit  Medication Sig Dispense Refill  . lisinopril (PRINIVIL,ZESTRIL) 20 MG tablet Take 20 mg by mouth daily.    Marland Kitchen acetaminophen (TYLENOL ARTHRITIS PAIN) 650 MG CR tablet Take 650 mg by mouth every 8 (eight) hours as needed.      Marland Kitchen aspirin 81 MG tablet Take 81 mg by mouth daily.      Marland Kitchen BD  PEN NEEDLE NANO U/F 32G X 4 MM MISC USE AS DIRECTED WITH LANTUS 100 each 2  . glimepiride (AMARYL) 4 MG tablet Take 1 tablet (4 mg total) by mouth 2 (two) times daily. 180 tablet 3  . glucose blood (ONE TOUCH ULTRA TEST) test strip CHECK BLOOD SUGAR TWICE DAILY AND AS DIRECTED FROM DM (DX. E11.22, N18.3, Z79.4) 200 each 1  . Insulin Glargine (LANTUS SOLOSTAR) 100 UNIT/ML Solostar Pen INJECT 20 UNITS INTO THE SKIN EVERY MORNING 15 mL 1  . JANUVIA 50 MG tablet TAKE 1 TABLET BY MOUTH ONCE DAILY 90 tablet 1  . levothyroxine (SYNTHROID, LEVOTHROID) 25 MCG tablet Take 1 tablet (25 mcg total) by mouth daily before breakfast. 90 tablet 3  . Multiple Vitamin (MULTIVITAMIN) capsule Take 1 capsule by mouth daily.      . NON FORMULARY 100 BC ultra fine needles    . Omega-3 Fatty Acids (FISH OIL PO) Take 2 capsules by mouth daily.      . pantoprazole (PROTONIX) 40 MG tablet Take 1 tablet (40 mg total) by mouth daily. 90 tablet 3  . ranitidine (ZANTAC) 150 MG capsule Take 150 mg by mouth daily as needed for heartburn.    . rosuvastatin (CRESTOR) 5 MG tablet TAKE 1 TABLET BY MOUTH EVERY OTHER DAY 45 tablet 3  . senna (SENOKOT) 8.6 MG tablet Take 1 tablet by mouth 2 (two) times daily.    . tamsulosin (FLOMAX) 0.4 MG CAPS capsule TAKE 1 CAPSULE BY MOUTH DAILY 90 capsule 3  . [DISCONTINUED] omeprazole (PRILOSEC OTC) 20 MG tablet Take 1 tablet (20 mg total) by mouth daily. 90 tablet 1   No current facility-administered medications on file prior to visit.     Observations/Objective: Patient sounded well   Mood is good  Somewhat HOH on the phone Mentally sharp and answered questions appropriately   Assessment and Plan: Problem List Items Addressed This Visit      Cardiovascular and Mediastinum   Essential hypertension    Per pt no change in bp and no symptoms of high or low bp  Wt is also stable Good health habits       Relevant Medications   lisinopril (PRINIVIL,ZESTRIL) 20 MG tablet     Endocrine   Controlled type 2 diabetes mellitus with chronic kidney disease ([place]) - Primary    Lab Results  Component Value Date   HGBA1C 7.0 (H) 03/28/2019   Up from 6.4 - may have to allow it to be higher in order to avoid hypoglycemia He adjusts lantus by 5 u down if needed for lower glucose levels utd eye care No foot problems/good care as well  On ace and statin  F/u 6 mo  Rev diet- good        Relevant Medications   lisinopril (PRINIVIL,ZESTRIL) 20 MG tablet     Genitourinary   Renal insufficiency    Stable per labs Avoids nsaids and nephrotoxins  No change with inc of lisinopril to 20 mg  Enc good fluid intake        Other   Hyperlipidemia    Disc goals for lipids and reasons to control them Rev last labs with pt Rev low sat fat diet in detail Well controlled with statin       Relevant Medications   lisinopril (PRINIVIL,ZESTRIL) 20 MG tablet       Follow Up Instructions: Alert [place] if you have low blood glucose levels Stay active  Take care of yourself  Follow  up in 6 months    I discussed the assessment and treatment plan with the patient. The patient was provided an opportunity to ask questions and all were answered. The patient agreed with the plan and demonstrated an understanding of the instructions.   The patient was advised to call back or seek an in-person evaluation if the symptoms worsen or if the condition fails to improve as anticipated.    Loura Pardon, MD

## 2019-03-30 NOTE — Assessment & Plan Note (Signed)
Per pt no change in bp and no symptoms of high or low bp  Wt is also stable Good health habits

## 2019-03-30 NOTE — Assessment & Plan Note (Signed)
Lab Results  Component Value Date   HGBA1C 7.0 (H) 03/28/2019   Up from 6.4 - may have to allow it to be higher in order to avoid hypoglycemia He adjusts lantus by 5 u down if needed for lower glucose levels utd eye care No foot problems/good care as well  On ace and statin  F/u 6 mo  Rev diet- good

## 2019-03-30 NOTE — Assessment & Plan Note (Signed)
Stable per labs Avoids nsaids and nephrotoxins  No change with inc of lisinopril to 20 mg  Enc good fluid intake

## 2019-03-31 ENCOUNTER — Telehealth: Payer: Self-pay | Admitting: Family Medicine

## 2019-03-31 ENCOUNTER — Other Ambulatory Visit: Payer: Self-pay | Admitting: Family Medicine

## 2019-03-31 MED ORDER — LISINOPRIL 20 MG PO TABS: 20.0000 mg | ORAL_TABLET | Freq: Every day | ORAL | 0 refills | Status: DC

## 2019-03-31 NOTE — Telephone Encounter (Signed)
Need to verify strength for Lisinopril

## 2019-04-04 NOTE — Telephone Encounter (Signed)
Left VM verifying strength

## 2019-04-28 ENCOUNTER — Other Ambulatory Visit: Payer: Self-pay | Admitting: Family Medicine

## 2019-06-03 ENCOUNTER — Emergency Department
Admission: EM | Admit: 2019-06-03 | Discharge: 2019-06-03 | Disposition: A | Discharge status: Home / Self Care | Payer: Medicare Other | Attending: Emergency Medicine | Admitting: Emergency Medicine

## 2019-06-03 ENCOUNTER — Other Ambulatory Visit: Payer: Self-pay

## 2019-06-03 ENCOUNTER — Encounter: Payer: Self-pay | Admitting: Emergency Medicine

## 2019-06-03 ENCOUNTER — Emergency Department: Payer: Medicare Other

## 2019-06-03 DIAGNOSIS — Y999 Unspecified external cause status: Secondary | ICD-10-CM | POA: Insufficient documentation

## 2019-06-03 DIAGNOSIS — S59901A Unspecified injury of right elbow, initial encounter: Secondary | ICD-10-CM | POA: Diagnosis present

## 2019-06-03 DIAGNOSIS — Y92009 Unspecified place in unspecified non-institutional (private) residence as the place of occurrence of the external cause: Secondary | ICD-10-CM | POA: Diagnosis not present

## 2019-06-03 DIAGNOSIS — I2510 Atherosclerotic heart disease of native coronary artery without angina pectoris: Secondary | ICD-10-CM | POA: Insufficient documentation

## 2019-06-03 DIAGNOSIS — N183 Chronic kidney disease, stage 3 (moderate): Secondary | ICD-10-CM | POA: Diagnosis not present

## 2019-06-03 DIAGNOSIS — I129 Hypertensive chronic kidney disease with stage 1 through stage 4 chronic kidney disease, or unspecified chronic kidney disease: Secondary | ICD-10-CM | POA: Diagnosis not present

## 2019-06-03 DIAGNOSIS — E1140 Type 2 diabetes mellitus with diabetic neuropathy, unspecified: Secondary | ICD-10-CM | POA: Diagnosis not present

## 2019-06-03 DIAGNOSIS — E1122 Type 2 diabetes mellitus with diabetic chronic kidney disease: Secondary | ICD-10-CM | POA: Insufficient documentation

## 2019-06-03 DIAGNOSIS — S51019A Laceration without foreign body of unspecified elbow, initial encounter: Secondary | ICD-10-CM

## 2019-06-03 DIAGNOSIS — Z794 Long term (current) use of insulin: Secondary | ICD-10-CM | POA: Insufficient documentation

## 2019-06-03 DIAGNOSIS — I252 Old myocardial infarction: Secondary | ICD-10-CM | POA: Insufficient documentation

## 2019-06-03 DIAGNOSIS — Z79899 Other long term (current) drug therapy: Secondary | ICD-10-CM | POA: Insufficient documentation

## 2019-06-03 DIAGNOSIS — W010XXA Fall on same level from slipping, tripping and stumbling without subsequent striking against object, initial encounter: Secondary | ICD-10-CM | POA: Diagnosis not present

## 2019-06-03 DIAGNOSIS — Y9389 Activity, other specified: Secondary | ICD-10-CM | POA: Insufficient documentation

## 2019-06-03 DIAGNOSIS — S51012A Laceration without foreign body of left elbow, initial encounter: Secondary | ICD-10-CM | POA: Diagnosis not present

## 2019-06-03 NOTE — Discharge Instructions (Signed)
Change the bandage daily.  Follow up with your primary care provider for any concern of infection.  If you are unable to schedule an appointment and have concerns, return to the ER.

## 2019-06-03 NOTE — ED Provider Notes (Signed)
[facility] Emergency Department Provider Note ____________________________________________  Time seen: Approximately 9:25 PM  I have reviewed the triage vital signs and the nursing notes.   HISTORY  Chief Complaint Elbow Pain    HPI David Henry is a 85 y.o. male who presents to the emergency department for evaluation and treatment of right elbow pain after mechanical, non-syncopal fall this evening.  Patient states he was taking some old bread outside for the birds and upon return inside, he did not lift his foot up far enough and tripped on the door frame causing his fall.  He denies striking his head or having any loss of consciousness.  He rinsed the wounds to the elbow with Bactine and then applied some type of powder that controls bleeding to the wounds.  They were then bandaged and he came to the emergency department.  Patient reports that his tetanus vaccination is current. Past Medical History:  Diagnosis Date  . Arthritis   . CAD (coronary artery disease)    3 stents  . Colon polyp   . Diabetes mellitus    type II  . Dyspnea    with exertion  . GERD (gastroesophageal reflux disease)   . Hyperlipidemia   . Hypertension   . Hypothyroidism   . Kidney stones    stage III CKD  . Neuropathy   . Peripheral vascular disease ([place])    neuropathy in feet    Patient Active Problem List   Diagnosis Date Noted  . Pre-op examination 12/14/2017  . Routine general medical examination at a health care facility 03/09/2016  . Constipation 09/17/2015  . Encounter for Medicare annual wellness exam 12/08/2014  . Hematuria, microscopic 05/26/2013  . Renal insufficiency 05/24/2013  . Anemia 11/23/2012  . Hypothyroid 11/23/2012  . GERD 11/29/2010  . Coronary atherosclerosis 04/18/2007  . Controlled type 2 diabetes mellitus with chronic kidney disease ([place]) 04/15/2007  . Hyperlipidemia 04/15/2007  . Essential hypertension 04/15/2007  . MYOCARDIAL  INFARCTION, HX OF 04/15/2007  . ACTINIC KERATOSIS 04/15/2007    Past Surgical History:  Procedure Laterality Date  . APPENDECTOMY  1984  . CATARACT EXTRACTION Bilateral 04/2001  . CHOLECYSTECTOMY  1989  . COLONOSCOPY N/A 2/14   hemorrhoids and tics (after pos ifob card)  . CORONARY ANGIOPLASTY  2006   3 stents  . ESOPHAGOGASTRODUODENOSCOPY N/A 2/14  . RETINAL DETACHMENT SURGERY Right 2003  . SHOULDER ARTHROSCOPY WITH ROTATOR CUFF REPAIR AND SUBACROMIAL DECOMPRESSION Right 12/30/2017   Procedure: SHOULDER ARTHROSCOPY WITH ROTATOR CUFF REPAIR AND SUBACROMIAL DECOMPRESSION;  Surgeon: Leanor Kail, MD;  Location: [facility] ORS;  Service: Orthopedics;  Laterality: Right;  . SHOULDER SURGERY  2012   Lt shoulder repair    Prior to Admission medications   Medication Sig Start Date End Date Taking? Authorizing Provider  acetaminophen (TYLENOL ARTHRITIS PAIN) 650 MG CR tablet Take 650 mg by mouth every 8 (eight) hours as needed.      [provider]  aspirin 81 MG tablet Take 81 mg by mouth daily.      [provider]  BD PEN NEEDLE NANO U/F 32G X 4 MM MISC USE AS DIRECTED WITH LANTUS 10/12/18   Tower, Roque Lias A, MD  glimepiride (AMARYL) 4 MG tablet Take 1 tablet (4 mg total) by mouth 2 (two) times daily. 09/24/18   Tower, Marne A, MD  glucose blood (ONE TOUCH ULTRA TEST) test strip CHECK BLOOD SUGAR TWICE DAILY AND AS DIRECTED FROM DM (DX. E11.22, N18.3, Z79.4) 06/16/18  Tower, Wynelle Fanny, MD  Insulin Glargine (LANTUS SOLOSTAR) 100 UNIT/ML Solostar Pen INJECT 20 UNITS INTO THE SKIN EVERY MORNING 03/21/19   Tower, Wynelle Fanny, MD  JANUVIA 50 MG tablet TAKE 1 TABLET BY MOUTH ONCE DAILY 01/05/19   Tower, Wynelle Fanny, MD  levothyroxine (SYNTHROID, LEVOTHROID) 25 MCG tablet Take 1 tablet (25 mcg total) by mouth daily before breakfast. 09/24/18   Tower, Wynelle Fanny, MD  lisinopril (PRINIVIL,ZESTRIL) 20 MG tablet Take 1 tablet (20 mg total) by mouth daily. 03/31/19   Tower, Wynelle Fanny, MD  Multiple Vitamin  (MULTIVITAMIN) capsule Take 1 capsule by mouth daily.      [provider]  NON FORMULARY 100 BC ultra fine needles    [provider]  Omega-3 Fatty Acids (FISH OIL PO) Take 2 capsules by mouth daily.      [provider]  pantoprazole (PROTONIX) 40 MG tablet Take 1 tablet (40 mg total) by mouth daily. 11/22/18   Tower, Wynelle Fanny, MD  ranitidine (ZANTAC) 150 MG capsule Take 150 mg by mouth daily as needed for heartburn.    [provider]  rosuvastatin (CRESTOR) 5 MG tablet TAKE 1 TABLET BY MOUTH EVERY OTHER DAY 06/16/18   Tower, Wynelle Fanny, MD  senna (SENOKOT) 8.6 MG tablet Take 1 tablet by mouth 2 (two) times daily.    [provider]  tamsulosin (FLOMAX) 0.4 MG CAPS capsule TAKE 1 CAPSULE BY MOUTH ONCE DAILY 04/28/19   Tower, Wynelle Fanny, MD  omeprazole (PRILOSEC OTC) 20 MG tablet Take 1 tablet (20 mg total) by mouth daily. 12/06/13 06/14/14  Tower, Wynelle Fanny, MD    Allergies Adhesive [tape], Azithromycin, Celecoxib, and Zocor [simvastatin]  Family History  Problem Relation Age of Onset  . Cancer Brother        prostate CA  . Heart disease Mother   . Stroke Father   . Kidney disease Sister   . Kidney disease Sister   . Clotting disorder Sister   . COPD Brother   . Heart disease Brother     Social History Social History   Tobacco Use  . Smoking status: Never Smoker  . Smokeless tobacco: Never Used  Substance Use Topics  . Alcohol use: No    Alcohol/week: 0.0 standard drinks  . Drug use: No    Review of Systems Constitutional: Negative for fever. Cardiovascular: Negative for chest pain. Respiratory: Negative for shortness of breath. Musculoskeletal: Positive for right elbow pain. Skin: Positive for skin tears to the right elbow and forearm. Neurological: Negative for decrease in sensation  ____________________________________________   PHYSICAL EXAM:  VITAL SIGNS: ED Triage Vitals  Enc Vitals Group     BP 06/03/19 2026 (!) 141/66      Pulse Rate 06/03/19 2026 86     Resp 06/03/19 2026 18     Temp 06/03/19 2026 98.4 F (36.9 C)     Temp Source 06/03/19 2026 Oral     SpO2 06/03/19 2026 98 %     Weight 06/03/19 2028 155 lb 6.8 oz (70.5 kg)     Height 06/03/19 2028 5\' 8"  (1.727 m)     Head Circumference --      Peak Flow --      Pain Score 06/03/19 2028 0     Pain Loc --      Pain Edu? --      Excl. in [place]? --     Constitutional: Alert and oriented. Well appearing and in no acute distress.  Eyes: Conjunctivae are clear without discharge or drainage Head: Atraumatic Neck: Supple.  No focal midline tenderness. Respiratory: No cough. Respirations are even and unlabored. Musculoskeletal: Full, active range of motion noted of the extremities, specifically of the right elbow. Neurologic: Awake, alert, oriented x4. Skin: 2 sizable skin tear lacerations to the right forearm and over the right olecranon.  No active bleeding. Psychiatric: Affect and behavior are appropriate.  ____________________________________________   LABS (all labs ordered are listed, but only abnormal results are displayed)  Labs Reviewed - No data to display ____________________________________________  RADIOLOGY  Image of the right elbow is negative for acute findings. ____________________________________________   PROCEDURES  Procedures   Skin tear lacerations to the right elbow were cleaned with Hibiclens and normal saline.  The wound edges were loosely approximated and secured with Dermabond.  ____________________________________________   INITIAL IMPRESSION / ASSESSMENT AND PLAN / ED COURSE  FIORE DETJEN is a 85 y.o. who presents to the emergency department for treatment and evaluation after mechanical, non-syncopal fall that caused an injury to his right elbow.  X-ray is negative for acute bony abnormality.  Wounds were cleaned and dressed as above.  He is to schedule follow-up appointment with his primary care provider if  he has any sign or concern of infection.  If he is unable to see primary care, he is to return to the emergency department.  Wound care was also discussed.  Medications - No data to display  Pertinent labs & imaging results that were available during my care of the patient were reviewed by me and considered in my medical decision making (see chart for details).  _________________________________________   FINAL CLINICAL IMPRESSION(S) / ED DIAGNOSES  Final diagnoses:  Skin tear of elbow without complication, initial encounter    ED Discharge Orders    None       If controlled substance prescribed during this visit, 12 month history viewed on the [place] prior to issuing an initial prescription for Schedule II or III opiod.   Victorino Dike, FNP 06/04/19 0032    Carrie Mew, MD 06/13/19 [phone]

## 2019-06-03 NOTE — ED Notes (Signed)
Patient declined discharge vitals.

## 2019-06-03 NOTE — ED Triage Notes (Signed)
Pt reports he tripped walking up the stairs landing on his left elbow. Skin tears noted to elbow with bleeding controlled. Pt denies other injury.

## 2019-06-03 NOTE — ED Notes (Signed)
Per NP, wound was wrapped with non-adherent gauze and kling.

## 2019-06-20 ENCOUNTER — Other Ambulatory Visit: Payer: Self-pay | Admitting: Family Medicine

## 2019-06-27 ENCOUNTER — Other Ambulatory Visit: Payer: Self-pay | Admitting: Family Medicine

## 2019-07-18 ENCOUNTER — Other Ambulatory Visit: Payer: Self-pay | Admitting: Family Medicine

## 2019-08-15 ENCOUNTER — Other Ambulatory Visit: Payer: Self-pay | Admitting: Family Medicine

## 2019-09-20 ENCOUNTER — Other Ambulatory Visit: Payer: Self-pay | Admitting: Family Medicine

## 2019-09-21 ENCOUNTER — Other Ambulatory Visit: Payer: Self-pay | Admitting: Family Medicine

## 2019-09-22 ENCOUNTER — Other Ambulatory Visit: Payer: Self-pay | Admitting: Family Medicine

## 2019-10-03 ENCOUNTER — Ambulatory Visit: Payer: Medicare Other

## 2019-10-03 ENCOUNTER — Encounter: Payer: Medicare Other | Admitting: Family Medicine

## 2019-10-03 ENCOUNTER — Encounter: Payer: Self-pay | Admitting: Family Medicine

## 2019-10-03 ENCOUNTER — Other Ambulatory Visit: Payer: Self-pay

## 2019-10-03 ENCOUNTER — Ambulatory Visit (INDEPENDENT_AMBULATORY_CARE_PROVIDER_SITE_OTHER): Payer: Medicare Other | Admitting: Family Medicine

## 2019-10-03 VITALS — BP 122/72 | HR 66 | Temp 97.6°F | Ht 67.5 in | Wt 163.1 lb

## 2019-10-03 DIAGNOSIS — E1122 Type 2 diabetes mellitus with diabetic chronic kidney disease: Secondary | ICD-10-CM

## 2019-10-03 DIAGNOSIS — E039 Hypothyroidism, unspecified: Secondary | ICD-10-CM | POA: Diagnosis not present

## 2019-10-03 DIAGNOSIS — N289 Disorder of kidney and ureter, unspecified: Secondary | ICD-10-CM

## 2019-10-03 DIAGNOSIS — E7800 Pure hypercholesterolemia, unspecified: Secondary | ICD-10-CM | POA: Diagnosis not present

## 2019-10-03 DIAGNOSIS — I10 Essential (primary) hypertension: Secondary | ICD-10-CM

## 2019-10-03 DIAGNOSIS — Z0000 Encounter for general adult medical examination without abnormal findings: Secondary | ICD-10-CM | POA: Diagnosis not present

## 2019-10-03 DIAGNOSIS — Z794 Long term (current) use of insulin: Secondary | ICD-10-CM | POA: Diagnosis not present

## 2019-10-03 DIAGNOSIS — N183 Chronic kidney disease, stage 3 unspecified: Secondary | ICD-10-CM | POA: Diagnosis not present

## 2019-10-03 LAB — CBC WITH DIFFERENTIAL/PLATELET
Basophils Absolute: 0 10*3/uL (ref 0.0–0.1)
Basophils Relative: 0.5 % (ref 0.0–3.0)
Eosinophils Absolute: 0.1 10*3/uL (ref 0.0–0.7)
Eosinophils Relative: 1.1 % (ref 0.0–5.0)
HCT: 38.5 % — ABNORMAL LOW (ref 39.0–52.0)
Hemoglobin: 13 g/dL (ref 13.0–17.0)
Lymphocytes Relative: 16.2 % (ref 12.0–46.0)
Lymphs Abs: 1.3 10*3/uL (ref 0.7–4.0)
MCHC: 33.8 g/dL (ref 30.0–36.0)
MCV: 103.5 fl — ABNORMAL HIGH (ref 78.0–100.0)
Monocytes Absolute: 0.7 10*3/uL (ref 0.1–1.0)
Monocytes Relative: 8.4 % (ref 3.0–12.0)
Neutro Abs: 6.1 10*3/uL (ref 1.4–7.7)
Neutrophils Relative %: 73.8 % (ref 43.0–77.0)
Platelets: 218 10*3/uL (ref 150.0–400.0)
RBC: 3.72 Mil/uL — ABNORMAL LOW (ref 4.22–5.81)
RDW: 13.7 % (ref 11.5–15.5)
WBC: 8.3 10*3/uL (ref 4.0–10.5)

## 2019-10-03 LAB — COMPREHENSIVE METABOLIC PANEL
ALT: 20 U/L (ref 0–53)
AST: 19 U/L (ref 0–37)
Albumin: 4.3 g/dL (ref 3.5–5.2)
Alkaline Phosphatase: 76 U/L (ref 39–117)
BUN: 27 mg/dL — ABNORMAL HIGH (ref 6–23)
CO2: 25 mEq/L (ref 19–32)
Calcium: 10 mg/dL (ref 8.4–10.5)
Chloride: 108 mEq/L (ref 96–112)
Creatinine, Ser: 1.85 mg/dL — ABNORMAL HIGH (ref 0.40–1.50)
GFR: 34.42 mL/min — ABNORMAL LOW (ref >60.00)
Glucose, Bld: 65 mg/dL — ABNORMAL LOW (ref 70–99)
Potassium: 4.7 mEq/L (ref 3.5–5.1)
Sodium: 144 mEq/L (ref 135–145)
Total Bilirubin: 1 mg/dL (ref 0.2–1.2)
Total Protein: 6.7 g/dL (ref 6.0–8.3)

## 2019-10-03 LAB — LIPID PANEL
Cholesterol: 113 mg/dL (ref 0–200)
HDL: 46.5 mg/dL (ref >39.00)
LDL Cholesterol: 53 mg/dL (ref 0–99)
NonHDL: 66.88
Total CHOL/HDL Ratio: 2
Triglycerides: 70 mg/dL (ref 0.0–149.0)
VLDL: 14 mg/dL (ref 0.0–40.0)

## 2019-10-03 LAB — HEMOGLOBIN A1C: Hgb A1c MFr Bld: 6.9 % — ABNORMAL HIGH (ref 4.6–6.5)

## 2019-10-03 LAB — TSH: TSH: 6.19 u[IU]/mL — ABNORMAL HIGH (ref 0.35–4.50)

## 2019-10-03 MED ORDER — LEVOTHYROXINE SODIUM 25 MCG PO TABS: ORAL_TABLET | ORAL | 3 refills | Status: DC

## 2019-10-03 MED ORDER — PANTOPRAZOLE SODIUM 40 MG PO TBEC: 40.0000 mg | DELAYED_RELEASE_TABLET | Freq: Every day | ORAL | 3 refills | Status: DC

## 2019-10-03 MED ORDER — GLIMEPIRIDE 4 MG PO TABS: 4.0000 mg | ORAL_TABLET | Freq: Two times a day (BID) | ORAL | 3 refills | Status: DC

## 2019-10-03 MED ORDER — SITAGLIPTIN PHOSPHATE 50 MG PO TABS: 50.0000 mg | ORAL_TABLET | Freq: Every day | ORAL | 3 refills | Status: DC

## 2019-10-03 MED ORDER — ROSUVASTATIN CALCIUM 5 MG PO TABS: 5.0000 mg | ORAL_TABLET | ORAL | 3 refills | Status: DC

## 2019-10-03 MED ORDER — LANTUS SOLOSTAR 100 UNIT/ML ~~LOC~~ SOPN: PEN_INJECTOR | SUBCUTANEOUS | 3 refills | Status: DC

## 2019-10-03 MED ORDER — TAMSULOSIN HCL 0.4 MG PO CAPS: 0.4000 mg | ORAL_CAPSULE | Freq: Every day | ORAL | 3 refills | Status: DC

## 2019-10-03 MED ORDER — LISINOPRIL 20 MG PO TABS: 20.0000 mg | ORAL_TABLET | Freq: Every day | ORAL | 3 refills | Status: DC

## 2019-10-03 NOTE — Assessment & Plan Note (Signed)
Disc goals for lipids and reasons to control them Rev last labs with pt Rev low sat fat diet in detail Prev well controlled with low dose crestor and diet in setting of CAD Labs today

## 2019-10-03 NOTE — Assessment & Plan Note (Signed)
bp in fair control at this time  BP Readings from Last 1 Encounters:  10/03/19 122/72   No changes needed Most recent labs reviewed  Disc lifstyle change with low sodium diet and exercise  Labs ordered today

## 2019-10-03 NOTE — Assessment & Plan Note (Signed)
Reviewed health habits including diet and exercise and skin cancer prevention Reviewed appropriate screening tests for age  Also reviewed health mt list, fam hx and immunization status , as well as social and family history   See HPI Labs ordered  Pt gets D, ca in mvi  One fall - counseled on fall prevention No change in prostate clinical status  No cognitive concerns Pt has advance directive  BMI fairly stable  Missed high tones in hearing screen-not interested in audiology eval (has some cerumen so recommended otc drops for that) Eye exam is scheduled this month

## 2019-10-03 NOTE — Progress Notes (Signed)
Subjective:    Patient ID: David Henry, male    DOB: 06-23-1926, 85 y.o.   MRN: [id number]  HPI  Here for amw and health mt exam with review of chronic health problems   I have personally reviewed the Medicare Annual Wellness questionnaire and have noted 1. The patient's medical and social history 2. Their use of alcohol, tobacco or illicit drugs 3. Their current medications and supplements 4. The patient's functional ability including ADL's, fall risks, home safety risks and hearing or visual             impairment. 5. Diet and physical activities 6. Evidence for depression or mood disorders  The patients weight, height, BMI have been recorded in the chart and visual acuity is per eye clinic.  I have made referrals, counseling and provided education to the patient based review of the above and I have provided the pt with a written personalized care plan for preventive services. Reviewed and updated provider list, see scanned forms.  See scanned forms.  Routine anticipatory guidance given to patient.  See health maintenance. Colon cancer screening-out aged  Flu vaccine 9/20 Tetanus vaccine 8/19 Tdap Pneumovax complete  Zoster vaccine  4/16 zostavax  Interested in shingrix   Falls- fell once going fast up the stairs /did not lift feet and stumbles  Fractures- no fractures  Supplements- he takes one a day vitamin  Exercise - working in the yard / walks a lot  Prostate cancer screening-out aged  Nocturia 0-1 times No symptoms  Lab Results  Component Value Date   PSA 2.09 05/13/2011   PSA 1.83 08/18/2005   brother had prostate cancer  Advance directive- has a living will and power of attorney  Cognitive function addressed- see scanned forms- and if abnormal then additional documentation follows.  Short term memory change from age- misplacing things  Has not become confused or lost   PMH and SH reviewed  Meds, vitals, and allergies reviewed.   ROS: See HPI.   Otherwise negative.    Weight : Wt Readings from Last 3 Encounters:  10/03/19 163 lb 2 oz (74 kg)  06/03/19 155 lb 6.8 oz (70.5 kg)  09/24/18 162 lb 8 oz (73.7 kg)  wt is up a bit  Eats very healthy  25.17 kg/m   Hearing/vision:  Hearing Screening   125Hz  250Hz  500Hz  1000Hz  2000Hz  3000Hz  4000Hz  6000Hz  8000Hz   Right ear:   40 40 40  0    Left ear:   40 40 40  0    Vision Screening Comments: Pt had eye exam in Oct 2019 and another eye exam scheduled this month with Dr. Wallace Going planning eye exam this month  occ his R ear pops    bp is stable today  No cp or palpitations or headaches or edema  No side effects to medicines  BP Readings from Last 3 Encounters:  10/03/19 122/72  06/03/19 (!) 141/66  09/24/18 130/70     Lisinopril - tolerates well  No cardiac changes   Due for labs today   DM2 Taking amaryl and [place] and lantus  Lab Results  Component Value Date   HGBA1C 7.0 (H) 03/28/2019  watches closely for low blood sugar  He adjusts his lantus to avoid hypoglycemia  No longer has the hypoglycemia spells  Due for labs  Eye exam -due this month  Taking ace and statin  No metformin due to renal insuff   Hypothyroidism  Pt has no clinical  changes-feels the same  No change in energy level/ hair or skin/ edema and no tremor Lab Results  Component Value Date   TSH 4.07 09/24/2018    Due for labs today  On statin an diet for hyperlipidemia Lab Results  Component Value Date   CHOL 117 03/28/2019   HDL 41.80 03/28/2019   LDLCALC 59 03/28/2019   TRIG 79.0 03/28/2019   CHOLHDL 3 03/28/2019   Due for labs today Has been well controlled   Patient Active Problem List   Diagnosis Date Noted  . Routine general medical examination at a health care facility 03/09/2016  . Constipation 09/17/2015  . Encounter for Medicare annual wellness exam 12/08/2014  . Hematuria, microscopic 05/26/2013  . Renal insufficiency 05/24/2013  . Anemia 11/23/2012  . Hypothyroid  11/23/2012  . GERD 11/29/2010  . Coronary atherosclerosis 04/18/2007  . Controlled type 2 diabetes mellitus with chronic kidney disease ([place]) 04/15/2007  . Hyperlipidemia 04/15/2007  . Essential hypertension 04/15/2007  . MYOCARDIAL INFARCTION, HX OF 04/15/2007  . ACTINIC KERATOSIS 04/15/2007   Past Medical History:  Diagnosis Date  . Arthritis   . CAD (coronary artery disease)    3 stents  . Colon polyp   . Diabetes mellitus    type II  . Dyspnea    with exertion  . GERD (gastroesophageal reflux disease)   . Hyperlipidemia   . Hypertension   . Hypothyroidism   . Kidney stones    stage III CKD  . Neuropathy   . Peripheral vascular disease (HCC)    neuropathy in feet   Past Surgical History:  Procedure Laterality Date  . APPENDECTOMY  1984  . CATARACT EXTRACTION Bilateral 04/2001  . CHOLECYSTECTOMY  1989  . COLONOSCOPY N/A 2/14   hemorrhoids and tics (after pos ifob card)  . CORONARY ANGIOPLASTY  2006   3 stents  . ESOPHAGOGASTRODUODENOSCOPY N/A 2/14  . RETINAL DETACHMENT SURGERY Right 2003  . SHOULDER ARTHROSCOPY WITH ROTATOR CUFF REPAIR AND SUBACROMIAL DECOMPRESSION Right 12/30/2017   Procedure: SHOULDER ARTHROSCOPY WITH ROTATOR CUFF REPAIR AND SUBACROMIAL DECOMPRESSION;  Surgeon: Leanor Kail, MD;  Location: [facility] ORS;  Service: Orthopedics;  Laterality: Right;  . SHOULDER SURGERY  2012   Lt shoulder repair   Social History   Tobacco Use  . Smoking status: Never Smoker  . Smokeless tobacco: Never Used  Substance Use Topics  . Alcohol use: No    Alcohol/week: 0.0 standard drinks  . Drug use: No   Family History  Problem Relation Age of Onset  . Cancer Brother        prostate CA  . Heart disease Mother   . Stroke Father   . Kidney disease Sister   . Kidney disease Sister   . Clotting disorder Sister   . COPD Brother   . Heart disease Brother    Allergies  Allergen Reactions  . Adhesive [Tape] Dermatitis    Skin is thin so it is tough to take off.   Use paper tape  . Azithromycin Nausea Only    REACTION: nausea  . Celecoxib Other (See Comments)    Raises blood pressure  . Zocor [Simvastatin] Other (See Comments)    Muscle pain   Current Outpatient Medications on File Prior to Visit  Medication Sig Dispense Refill  . acetaminophen (TYLENOL ARTHRITIS PAIN) 650 MG CR tablet Take 650 mg by mouth every 8 (eight) hours as needed.      Marland Kitchen aspirin 81 MG tablet Take 81 mg by mouth  daily.      . BD PEN NEEDLE NANO U/F 32G X 4 MM MISC USE AS DIRECTED WITH LANTUS 100 each 0  . Multiple Vitamin (MULTIVITAMIN) capsule Take 1 capsule by mouth daily.      . NON FORMULARY 100 BC ultra fine needles    . Omega-3 Fatty Acids (FISH OIL PO) Take 2 capsules by mouth daily.      Glory Rosebush ULTRA test strip CHECK BLOOD SUGAR TWICE DAILY AND AS DIRECTED FROM DM 200 each 1  . ranitidine (ZANTAC) 150 MG capsule Take 150 mg by mouth daily as needed for heartburn.    . senna (SENOKOT) 8.6 MG tablet Take 1 tablet by mouth 2 (two) times daily.    . [DISCONTINUED] omeprazole (PRILOSEC OTC) 20 MG tablet Take 1 tablet (20 mg total) by mouth daily. 90 tablet 1   No current facility-administered medications on file prior to visit.     Review of Systems  Constitutional: Negative for activity change, appetite change, fatigue, fever and unexpected weight change.  HENT: Negative for congestion, rhinorrhea, sore throat and trouble swallowing.   Eyes: Negative for pain, redness, itching and visual disturbance.  Respiratory: Negative for cough, chest tightness, shortness of breath and wheezing.   Cardiovascular: Negative for chest pain and palpitations.  Gastrointestinal: Negative for abdominal pain, blood in stool, constipation, diarrhea and nausea.  Endocrine: Negative for cold intolerance, heat intolerance, polydipsia and polyuria.  Genitourinary: Negative for difficulty urinating, dysuria, frequency and urgency.  Musculoskeletal: Positive for arthralgias. Negative for  joint swelling and myalgias.  Skin: Negative for pallor and rash.  Neurological: Negative for dizziness, tremors, weakness, numbness and headaches.  Hematological: Negative for adenopathy. Does not bruise/bleed easily.  Psychiatric/Behavioral: Negative for decreased concentration and dysphoric mood. The patient is not nervous/anxious.        Objective:   Physical Exam Constitutional:      General: He is not in acute distress.    Appearance: Normal appearance. He is well-developed. He is not ill-appearing or diaphoretic.  HENT:     Head: Normocephalic and atraumatic.     Right Ear: Tympanic membrane, ear canal and external ear normal.     Left Ear: Tympanic membrane, ear canal and external ear normal.     Ears:     Comments: Partial cerumen obst- wet looking      Nose: Nose normal. No congestion.     Mouth/Throat:     Mouth: Mucous membranes are moist.     Pharynx: Oropharynx is clear. No posterior oropharyngeal erythema.  Eyes:     General: No scleral icterus.       Right eye: No discharge.        Left eye: No discharge.     Conjunctiva/sclera: Conjunctivae normal.     Pupils: Pupils are equal, round, and reactive to light.  Neck:     Musculoskeletal: Normal range of motion and neck supple. No neck rigidity or muscular tenderness.     Thyroid: No thyromegaly.     Vascular: No carotid bruit or JVD.  Cardiovascular:     Rate and Rhythm: Normal rate and regular rhythm.     Pulses: Normal pulses.     Heart sounds: Normal heart sounds. No gallop.   Pulmonary:     Effort: Pulmonary effort is normal. No respiratory distress.     Breath sounds: Normal breath sounds. No wheezing or rales.     Comments: Good air exch Chest:     Chest wall:  No tenderness.  Abdominal:     General: Bowel sounds are normal. There is no distension or abdominal bruit.     Palpations: Abdomen is soft. There is no mass.     Tenderness: There is no abdominal tenderness.     Hernia: No hernia is present.   Musculoskeletal:        General: No tenderness.     Right lower leg: No edema.     Left lower leg: No edema.     Comments: No kyphosis  Gait is slow but steady  Lymphadenopathy:     Cervical: No cervical adenopathy.  Skin:    General: Skin is warm and dry.     Coloration: Skin is not pale.     Findings: No erythema or rash.     Comments: SKs diffusely  Solar changes on arms and some scars  Neurological:     Mental Status: He is alert.     Cranial Nerves: No cranial nerve deficit.     Sensory: No sensory deficit.     Motor: No abnormal muscle tone.     Coordination: Coordination normal.     Gait: Gait normal.     Deep Tendon Reflexes: Reflexes are normal and symmetric. Reflexes normal.  Psychiatric:        Mood and Affect: Mood normal.        Cognition and Memory: Cognition and memory normal.     Comments: Mentally sharp           Assessment & Plan:   Problem List Items Addressed This Visit      Cardiovascular and Mediastinum   Essential hypertension    bp in fair control at this time  BP Readings from Last 1 Encounters:  10/03/19 122/72   No changes needed Most recent labs reviewed  Disc lifstyle change with low sodium diet and exercise  Labs ordered today      Relevant Medications   lisinopril (ZESTRIL) 20 MG tablet   rosuvastatin (CRESTOR) 5 MG tablet   Other Relevant Orders   CBC with Differential/Platelet   Comprehensive metabolic panel   Lipid panel   TSH     Endocrine   Controlled type 2 diabetes mellitus with chronic kidney disease ([place])    A1C ordered  Pt titrated lantus to avoid hypoglycemia  Very active and does work on diabetic diet  Eye exam planned this month   On ace and statin       Relevant Medications   lisinopril (ZESTRIL) 20 MG tablet   sitaGLIPtin (JANUVIA) 50 MG tablet   rosuvastatin (CRESTOR) 5 MG tablet   Insulin Glargine (LANTUS SOLOSTAR) 100 UNIT/ML Solostar Pen   glimepiride (AMARYL) 4 MG tablet   Other Relevant  Orders   Hemoglobin A1c   Hypothyroid    No clinical changes  No exam changes On levothyroxine 25 mcg TSH drawn today      Relevant Medications   levothyroxine (SYNTHROID) 25 MCG tablet   Other Relevant Orders   TSH     Genitourinary   Renal insufficiency    Lab Results  Component Value Date   CREATININE 1.86 (H) 03/28/2019   No longer on metformin and avoids nsaids Lab done today Sees neprhology        Other   Hyperlipidemia    Disc goals for lipids and reasons to control them Rev last labs with pt Rev low sat fat diet in detail Prev well controlled with low dose crestor and diet in  setting of CAD Labs today      Relevant Medications   lisinopril (ZESTRIL) 20 MG tablet   rosuvastatin (CRESTOR) 5 MG tablet   Other Relevant Orders   Lipid panel   Encounter for Medicare annual wellness exam - Primary    Reviewed health habits including diet and exercise and skin cancer prevention Reviewed appropriate screening tests for age  Also reviewed health mt list, fam hx and immunization status , as well as social and family history   See HPI Labs ordered  Pt gets D, ca in mvi  One fall - counseled on fall prevention No change in prostate clinical status  No cognitive concerns Pt has advance directive  BMI fairly stable  Missed high tones in hearing screen-not interested in audiology eval (has some cerumen so recommended otc drops for that) Eye exam is scheduled this month          Routine general medical examination at a health care facility    Reviewed health habits including diet and exercise and skin cancer prevention Reviewed appropriate screening tests for age  Also reviewed health mt list, fam hx and immunization status , as well as social and family history   See HPI Labs ordered  Pt gets D, ca in mvi  One fall - counseled on fall prevention No change in prostate clinical status  No cognitive concerns Pt has advance directive  BMI fairly stable   Missed high tones in hearing screen-not interested in audiology eval (has some cerumen so recommended otc drops for that) Eye exam is scheduled this month

## 2019-10-03 NOTE — Assessment & Plan Note (Signed)
A1C ordered  Pt titrated lantus to avoid hypoglycemia  Very active and does work on diabetic diet  Eye exam planned this month   On ace and statin

## 2019-10-03 NOTE — Assessment & Plan Note (Addendum)
Lab Results  Component Value Date   CREATININE 1.86 (H) 03/28/2019   No longer on metformin and avoids nsaids Lab done today Sees neprhology

## 2019-10-03 NOTE — Assessment & Plan Note (Signed)
No clinical changes  No exam changes On levothyroxine 25 mcg TSH drawn today

## 2019-10-03 NOTE — Patient Instructions (Addendum)
If you are interested in the new shingles vaccine (Shingrix) - call your local pharmacy to check on coverage and availability  If affordable, get on a wait list at your pharmacy to get the vaccine.  Take care of yourself Stick to a diabetic diet   Use sunscreen when you work outside   I refilled your medicines   Labs today

## 2019-10-05 ENCOUNTER — Telehealth: Payer: Self-pay | Admitting: Family Medicine

## 2019-10-05 DIAGNOSIS — D7589 Other specified diseases of blood and blood-forming organs: Secondary | ICD-10-CM | POA: Insufficient documentation

## 2019-10-05 DIAGNOSIS — E039 Hypothyroidism, unspecified: Secondary | ICD-10-CM

## 2019-10-05 MED ORDER — LEVOTHYROXINE SODIUM 50 MCG PO TABS: 50.0000 ug | ORAL_TABLET | Freq: Every day | ORAL | 3 refills | Status: DC

## 2019-10-05 NOTE — Telephone Encounter (Signed)
Pt notified Rx sent and advise of Dr. Marliss Coots comments. Lab appt scheduled

## 2019-10-05 NOTE — Telephone Encounter (Signed)
-----   Message from David Henry, [place] sent at 10/05/2019  4:27 PM EDT ----- Pt notified of lab results and Dr. Marliss Coots comments. Pt said that he hasn't missed any doses of his synthroid and he takes meds 1st thing in am before any other meds/supplements and food.  [place]  #also pt asked if B12 level would be checked if we have to recheck his thyroid level

## 2019-10-05 NOTE — Telephone Encounter (Signed)
I sent in levothyroxine 50 mcg daily  Please schedule lab appt for 6 wk I ordered TSH and B12  Thanks

## 2019-10-11 LAB — HM DIABETES EYE EXAM

## 2019-10-13 ENCOUNTER — Encounter: Payer: Self-pay | Admitting: Family Medicine

## 2019-11-15 ENCOUNTER — Other Ambulatory Visit: Payer: Self-pay

## 2019-11-15 ENCOUNTER — Other Ambulatory Visit (INDEPENDENT_AMBULATORY_CARE_PROVIDER_SITE_OTHER): Payer: Medicare Other

## 2019-11-15 DIAGNOSIS — D7589 Other specified diseases of blood and blood-forming organs: Secondary | ICD-10-CM

## 2019-11-15 DIAGNOSIS — E039 Hypothyroidism, unspecified: Secondary | ICD-10-CM | POA: Diagnosis not present

## 2019-11-15 LAB — VITAMIN B12: Vitamin B-12: 423 pg/mL (ref 211–911)

## 2019-11-15 LAB — TSH: TSH: 3.87 u[IU]/mL (ref 0.35–4.50)

## 2019-11-22 ENCOUNTER — Encounter: Payer: Self-pay | Admitting: *Deleted

## 2019-12-27 ENCOUNTER — Telehealth: Payer: Self-pay

## 2019-12-27 NOTE — Telephone Encounter (Signed)
Patient's wife and patient called stating that patient has [employer] now as of January 1st. Per [facility] patient needs prior authorization for one touch ultra blue test strips he has been using. He gets this supply from [employer]. Accu check and Trivida meter and supplies are covered but through mail order pharmacy and patient wants to stay with local pharmacy and use what he has been using for years. He checks his sugar once daily. Insurance phone number is [phone] for PA. (new insurance information has been given to [place] to update in the chart)

## 2019-12-29 DIAGNOSIS — E1129 Type 2 diabetes mellitus with other diabetic kidney complication: Secondary | ICD-10-CM | POA: Diagnosis not present

## 2019-12-29 DIAGNOSIS — N1832 Chronic kidney disease, stage 3b: Secondary | ICD-10-CM | POA: Diagnosis not present

## 2019-12-29 DIAGNOSIS — I10 Essential (primary) hypertension: Secondary | ICD-10-CM | POA: Diagnosis not present

## 2019-12-29 NOTE — Telephone Encounter (Signed)
PA done at [email or web address], I will await a response

## 2020-01-02 ENCOUNTER — Other Ambulatory Visit: Payer: Self-pay | Admitting: Family Medicine

## 2020-01-02 NOTE — Telephone Encounter (Signed)
Order done and in IN box  

## 2020-01-02 NOTE — Telephone Encounter (Signed)
David Henry with [facility] left v/m that [facility] will not pay [place] for one touch diabetic supplies even with PA. David Henry said accu chek aviva meter, test strips and soft clix lancets testing bid would be paid by [employer]. David Henry has notified pt of this info and David Henry request new rx for accu chek diabetic supplies. Chris request cb.

## 2020-01-02 NOTE — Telephone Encounter (Signed)
PA was approved, pt and pharmacy notified. Letter placed in Dr. Marliss Coots inbox for review

## 2020-01-03 NOTE — Telephone Encounter (Signed)
Order faxed.

## 2020-01-09 DIAGNOSIS — I10 Essential (primary) hypertension: Secondary | ICD-10-CM | POA: Diagnosis not present

## 2020-01-09 DIAGNOSIS — I2510 Atherosclerotic heart disease of native coronary artery without angina pectoris: Secondary | ICD-10-CM | POA: Diagnosis not present

## 2020-01-09 DIAGNOSIS — E782 Mixed hyperlipidemia: Secondary | ICD-10-CM | POA: Diagnosis not present

## 2020-01-09 DIAGNOSIS — I340 Nonrheumatic mitral (valve) insufficiency: Secondary | ICD-10-CM | POA: Diagnosis not present

## 2020-02-02 DIAGNOSIS — I2510 Atherosclerotic heart disease of native coronary artery without angina pectoris: Secondary | ICD-10-CM | POA: Diagnosis not present

## 2020-02-02 DIAGNOSIS — I10 Essential (primary) hypertension: Secondary | ICD-10-CM | POA: Diagnosis not present

## 2020-02-02 DIAGNOSIS — I6523 Occlusion and stenosis of bilateral carotid arteries: Secondary | ICD-10-CM | POA: Diagnosis not present

## 2020-02-02 DIAGNOSIS — I340 Nonrheumatic mitral (valve) insufficiency: Secondary | ICD-10-CM | POA: Diagnosis not present

## 2020-02-02 DIAGNOSIS — E782 Mixed hyperlipidemia: Secondary | ICD-10-CM | POA: Diagnosis not present

## 2020-02-27 ENCOUNTER — Other Ambulatory Visit: Payer: Self-pay | Admitting: Family Medicine

## 2020-04-02 ENCOUNTER — Encounter: Payer: Self-pay | Admitting: Family Medicine

## 2020-04-02 ENCOUNTER — Other Ambulatory Visit: Payer: Self-pay

## 2020-04-02 ENCOUNTER — Ambulatory Visit: Payer: Medicare PPO | Admitting: Family Medicine

## 2020-04-02 VITALS — BP 138/70 | HR 60 | Temp 97.4°F | Ht 67.5 in | Wt 164.0 lb

## 2020-04-02 DIAGNOSIS — I10 Essential (primary) hypertension: Secondary | ICD-10-CM | POA: Diagnosis not present

## 2020-04-02 DIAGNOSIS — E7800 Pure hypercholesterolemia, unspecified: Secondary | ICD-10-CM

## 2020-04-02 DIAGNOSIS — N1832 Chronic kidney disease, stage 3b: Secondary | ICD-10-CM | POA: Diagnosis not present

## 2020-04-02 DIAGNOSIS — E1122 Type 2 diabetes mellitus with diabetic chronic kidney disease: Secondary | ICD-10-CM | POA: Diagnosis not present

## 2020-04-02 DIAGNOSIS — N183 Chronic kidney disease, stage 3 unspecified: Secondary | ICD-10-CM

## 2020-04-02 DIAGNOSIS — Z794 Long term (current) use of insulin: Secondary | ICD-10-CM | POA: Diagnosis not present

## 2020-04-02 LAB — LIPID PANEL
Cholesterol: 110 mg/dL (ref 0–200)
HDL: 43.6 mg/dL (ref >39.00)
LDL Cholesterol: 53 mg/dL (ref 0–99)
NonHDL: 66.27
Total CHOL/HDL Ratio: 3
Triglycerides: 64 mg/dL (ref 0.0–149.0)
VLDL: 12.8 mg/dL (ref 0.0–40.0)

## 2020-04-02 LAB — COMPREHENSIVE METABOLIC PANEL
ALT: 29 U/L (ref 0–53)
AST: 26 U/L (ref 0–37)
Albumin: 4.2 g/dL (ref 3.5–5.2)
Alkaline Phosphatase: 67 U/L (ref 39–117)
BUN: 25 mg/dL — ABNORMAL HIGH (ref 6–23)
CO2: 27 mEq/L (ref 19–32)
Calcium: 9.6 mg/dL (ref 8.4–10.5)
Chloride: 108 mEq/L (ref 96–112)
Creatinine, Ser: 1.72 mg/dL — ABNORMAL HIGH (ref 0.40–1.50)
GFR: 37.4 mL/min — ABNORMAL LOW (ref >60.00)
Glucose, Bld: 91 mg/dL (ref 70–99)
Potassium: 4.7 mEq/L (ref 3.5–5.1)
Sodium: 141 mEq/L (ref 135–145)
Total Bilirubin: 0.9 mg/dL (ref 0.2–1.2)
Total Protein: 6.5 g/dL (ref 6.0–8.3)

## 2020-04-02 LAB — HEMOGLOBIN A1C: Hgb A1c MFr Bld: 6.8 % — ABNORMAL HIGH (ref 4.6–6.5)

## 2020-04-02 NOTE — Patient Instructions (Addendum)
Stay active  Don't skip meals  Let us know if blood glucose runs too low   Labs today   Take care of yourself   Follow up in 6 months for your annual exam

## 2020-04-02 NOTE — Assessment & Plan Note (Signed)
Labs today-A1C Enc to eat regular meals (and pm snack if am glucose is up )    We can scale back on lantus if needed  On statin and ace  utd eye care Good foot care

## 2020-04-02 NOTE — Assessment & Plan Note (Signed)
Disc goals for lipids and reasons to control them Rev last labs with pt Rev low sat fat diet in detail  Does well with generic crestor  Lab today

## 2020-04-02 NOTE — Assessment & Plan Note (Signed)
Continues to see nephrology Stable Enc good water intake

## 2020-04-02 NOTE — Progress Notes (Signed)
Subjective:    Patient ID: David Henry, male    DOB: 10-Oct-1926, 86 y.o.   MRN: [id number]  This visit occurred during the SARS-CoV-2 public health emergency.  Safety protocols were in place, including screening questions prior to the visit, additional usage of staff PPE, and extensive cleaning of exam room while observing appropriate contact time as indicated for disinfecting solutions.    HPI Pt presents for 6 mo f/u of chronic health problems  Due for labs today   Had his covid vaccine series and did well   Wt Readings from Last 3 Encounters:  04/02/20 164 lb (74.4 kg)  10/03/19 163 lb 2 oz (74 kg)  06/03/19 155 lb 6.8 oz (70.5 kg)  appetite is good  Has been outdoors/some exercise (yard work) 25.31 kg/m   Feels ok for his age  Trouble falling to sleep at night   Hypertension  bp is stable today  No cp or palpitations or headaches or edema  No side effects to medicines  BP Readings from Last 3 Encounters:  04/02/20 (!) 148/70  10/03/19 122/72  06/03/19 (!) 141/66   no BP problems    DM2 Lab Results  Component Value Date   HGBA1C 6.9 (H) 10/03/2019  due for A1C today  Glucose readings - runs from 60s to 105 in the am   lantus - 15 ml  (does not think we need to scale back)  amaryl   Eye exam 10/20  Taking ace and statin   Renal insuff -stage 3 b chronic kidney dz Lab Results  Component Value Date   CREATININE 1.85 (H) 10/03/2019   BUN 27 (H) 10/03/2019   NA 144 10/03/2019   K 4.7 10/03/2019   CL 108 10/03/2019   CO2 25 10/03/2019  sees nephrology GFR 34 Has been stable  He drinks 3-4 large cups of water today   Hyperlipidemia Lab Results  Component Value Date   CHOL 113 10/03/2019   HDL 46.50 10/03/2019   LDLCALC 53 10/03/2019   TRIG 70.0 10/03/2019   CHOLHDL 2 10/03/2019  crestor 5 mg  Due for 6 mo check today   Patient Active Problem List   Diagnosis Date Noted  . Macrocytosis 10/05/2019  . Routine general medical examination at  a health care facility 03/09/2016  . Constipation 09/17/2015  . Encounter for Medicare annual wellness exam 12/08/2014  . Hematuria, microscopic 05/26/2013  . Chronic kidney disease (CKD) 05/24/2013  . Anemia 11/23/2012  . Hypothyroid 11/23/2012  . GERD 11/29/2010  . Coronary atherosclerosis 04/18/2007  . Controlled type 2 diabetes mellitus with chronic kidney disease ([place]) 04/15/2007  . Hyperlipidemia 04/15/2007  . Essential hypertension 04/15/2007  . MYOCARDIAL INFARCTION, HX OF 04/15/2007  . ACTINIC KERATOSIS 04/15/2007   Past Medical History:  Diagnosis Date  . Arthritis   . CAD (coronary artery disease)    3 stents  . Colon polyp   . Diabetes mellitus    type II  . Dyspnea    with exertion  . GERD (gastroesophageal reflux disease)   . Hyperlipidemia   . Hypertension   . Hypothyroidism   . Kidney stones    stage III CKD  . Neuropathy   . Peripheral vascular disease (HCC)    neuropathy in feet   Past Surgical History:  Procedure Laterality Date  . APPENDECTOMY  1984  . CATARACT EXTRACTION Bilateral 04/2001  . CHOLECYSTECTOMY  1989  . COLONOSCOPY N/A 2/14   hemorrhoids and tics (after pos ifob  card)  . CORONARY ANGIOPLASTY  2006   3 stents  . ESOPHAGOGASTRODUODENOSCOPY N/A 2/14  . RETINAL DETACHMENT SURGERY Right 2003  . SHOULDER ARTHROSCOPY WITH ROTATOR CUFF REPAIR AND SUBACROMIAL DECOMPRESSION Right 12/30/2017   Procedure: SHOULDER ARTHROSCOPY WITH ROTATOR CUFF REPAIR AND SUBACROMIAL DECOMPRESSION;  Surgeon: Leanor Kail, MD;  Location: [facility] ORS;  Service: Orthopedics;  Laterality: Right;  . SHOULDER SURGERY  2012   Lt shoulder repair   Social History   Tobacco Use  . Smoking status: Never Smoker  . Smokeless tobacco: Never Used  Substance Use Topics  . Alcohol use: No    Alcohol/week: 0.0 standard drinks  . Drug use: No   Family History  Problem Relation Age of Onset  . Cancer Brother        prostate CA  . Heart disease Mother   . Stroke Father    . Kidney disease Sister   . Kidney disease Sister   . Clotting disorder Sister   . COPD Brother   . Heart disease Brother    Allergies  Allergen Reactions  . Adhesive [Tape] Dermatitis    Skin is thin so it is tough to take off.  Use paper tape  . Azithromycin Nausea Only    REACTION: nausea  . Celecoxib Other (See Comments)    Raises blood pressure  . Zocor [Simvastatin] Other (See Comments)    Muscle pain   Current Outpatient Medications on File Prior to Visit  Medication Sig Dispense Refill  . acetaminophen (TYLENOL ARTHRITIS PAIN) 650 MG CR tablet Take 650 mg by mouth every 8 (eight) hours as needed.      Marland Kitchen aspirin 81 MG tablet Take 81 mg by mouth daily.      Marland Kitchen glimepiride (AMARYL) 4 MG tablet Take 1 tablet (4 mg total) by mouth 2 (two) times daily. 180 tablet 3  . Insulin Glargine (LANTUS SOLOSTAR) 100 UNIT/ML Solostar Pen INJECT 20 UNITS INTO THE SKIN EVERY MORNING 15 mL 3  . levothyroxine (SYNTHROID) 50 MCG tablet Take 1 tablet (50 mcg total) by mouth daily. 90 tablet 3  . lisinopril (ZESTRIL) 20 MG tablet Take 1 tablet (20 mg total) by mouth daily. 90 tablet 3  . Multiple Vitamin (MULTIVITAMIN) capsule Take 1 capsule by mouth daily.      . NON FORMULARY 100 BC ultra fine needles    . Omega-3 Fatty Acids (FISH OIL PO) Take 2 capsules by mouth daily.      Glory Rosebush ULTRA test strip CHECK BLOOD SUGAR TWICE DAILY AND AS DIRECTED FROM DM 200 each 1  . pantoprazole (PROTONIX) 40 MG tablet Take 1 tablet (40 mg total) by mouth daily. 90 tablet 3  . ranitidine (ZANTAC) 150 MG capsule Take 150 mg by mouth daily as needed for heartburn.    . rosuvastatin (CRESTOR) 5 MG tablet Take 1 tablet (5 mg total) by mouth every other day. 45 tablet 3  . senna (SENOKOT) 8.6 MG tablet Take 1 tablet by mouth 2 (two) times daily.    . sitaGLIPtin (JANUVIA) 50 MG tablet Take 1 tablet (50 mg total) by mouth daily. 90 tablet 3  . tamsulosin (FLOMAX) 0.4 MG CAPS capsule Take 1 capsule (0.4 mg total)  by mouth daily. 90 capsule 3  . ULTICARE MICRO PEN NEEDLES 32G X 4 MM MISC USE AS DIRECTED WITH LANTUS 100 each 1  . [DISCONTINUED] omeprazole (PRILOSEC OTC) 20 MG tablet Take 1 tablet (20 mg total) by mouth daily. 90 tablet 1  No current facility-administered medications on file prior to visit.    Review of Systems  Constitutional: Negative for activity change, appetite change, fatigue, fever and unexpected weight change.  HENT: Negative for congestion, rhinorrhea, sore throat and trouble swallowing.   Eyes: Negative for pain, redness, itching and visual disturbance.  Respiratory: Negative for cough, chest tightness, shortness of breath and wheezing.   Cardiovascular: Negative for chest pain and palpitations.  Gastrointestinal: Negative for abdominal pain, blood in stool, constipation, diarrhea and nausea.  Endocrine: Negative for cold intolerance, heat intolerance, polydipsia and polyuria.  Genitourinary: Negative for difficulty urinating, dysuria, frequency and urgency.  Musculoskeletal: Negative for arthralgias, joint swelling and myalgias.  Skin: Negative for pallor and rash.  Neurological: Negative for dizziness, tremors, weakness, numbness and headaches.  Hematological: Negative for adenopathy. Does not bruise/bleed easily.  Psychiatric/Behavioral: Positive for sleep disturbance. Negative for decreased concentration and dysphoric mood. The patient is not nervous/anxious.        Objective:   Physical Exam Constitutional:      General: He is not in acute distress.    Appearance: Normal appearance. He is well-developed and normal weight. He is not ill-appearing or diaphoretic.  HENT:     Head: Normocephalic and atraumatic.  Eyes:     Conjunctiva/sclera: Conjunctivae normal.     Pupils: Pupils are equal, round, and reactive to light.  Neck:     Thyroid: No thyromegaly.     Vascular: No carotid bruit or JVD.  Cardiovascular:     Rate and Rhythm: Normal rate and regular rhythm.      Heart sounds: Normal heart sounds. No gallop.   Pulmonary:     Effort: Pulmonary effort is normal. No respiratory distress.     Breath sounds: Normal breath sounds. No wheezing or rales.  Abdominal:     General: Bowel sounds are normal. There is no distension or abdominal bruit.     Palpations: Abdomen is soft. There is no mass.     Tenderness: There is no abdominal tenderness.  Musculoskeletal:     Cervical back: Normal range of motion and neck supple.     Right lower leg: No edema.     Left lower leg: No edema.  Lymphadenopathy:     Cervical: No cervical adenopathy.  Skin:    General: Skin is warm and dry.     Findings: No erythema or rash.  Neurological:     Mental Status: He is alert.     Sensory: No sensory deficit.     Coordination: Coordination normal.     Deep Tendon Reflexes: Reflexes are normal and symmetric. Reflexes normal.  Psychiatric:        Mood and Affect: Mood normal.        Cognition and Memory: Cognition normal.     Comments: Good historian            Assessment & Plan:   Problem List Items Addressed This Visit      Cardiovascular and Mediastinum   Essential hypertension    bp in fair control at this time  BP Readings from Last 1 Encounters:  04/02/20 138/70   No changes needed Most recent labs reviewed  Disc lifstyle change with low sodium diet and exercise        Relevant Orders   Comprehensive metabolic panel     Endocrine   Controlled type 2 diabetes mellitus with chronic kidney disease ([place]) - Primary    Labs today-A1C Enc to eat regular meals (and  pm snack if am glucose is up )    We can scale back on lantus if needed  On statin and ace  utd eye care Good foot care      Relevant Orders   Hemoglobin A1c     Genitourinary   Chronic kidney disease (CKD)    Continues to see nephrology Stable Enc good water intake         Other   Hyperlipidemia    Disc goals for lipids and reasons to control them Rev last labs with  pt Rev low sat fat diet in detail  Does well with generic crestor  Lab today      Relevant Orders   Lipid panel

## 2020-04-02 NOTE — Assessment & Plan Note (Signed)
bp in fair control at this time  BP Readings from Last 1 Encounters:  04/02/20 138/70   No changes needed Most recent labs reviewed  Disc lifstyle change with low sodium diet and exercise

## 2020-04-03 ENCOUNTER — Encounter: Payer: Self-pay | Admitting: *Deleted

## 2020-06-26 DIAGNOSIS — E1129 Type 2 diabetes mellitus with other diabetic kidney complication: Secondary | ICD-10-CM | POA: Diagnosis not present

## 2020-06-26 DIAGNOSIS — I10 Essential (primary) hypertension: Secondary | ICD-10-CM | POA: Diagnosis not present

## 2020-06-26 DIAGNOSIS — N1832 Chronic kidney disease, stage 3b: Secondary | ICD-10-CM | POA: Diagnosis not present

## 2020-07-02 DIAGNOSIS — E875 Hyperkalemia: Secondary | ICD-10-CM | POA: Diagnosis not present

## 2020-07-02 DIAGNOSIS — I10 Essential (primary) hypertension: Secondary | ICD-10-CM | POA: Diagnosis not present

## 2020-07-02 DIAGNOSIS — E1129 Type 2 diabetes mellitus with other diabetic kidney complication: Secondary | ICD-10-CM | POA: Diagnosis not present

## 2020-07-02 DIAGNOSIS — N1832 Chronic kidney disease, stage 3b: Secondary | ICD-10-CM | POA: Diagnosis not present

## 2020-07-16 DIAGNOSIS — E875 Hyperkalemia: Secondary | ICD-10-CM | POA: Diagnosis not present

## 2020-07-17 DIAGNOSIS — H6123 Impacted cerumen, bilateral: Secondary | ICD-10-CM | POA: Diagnosis not present

## 2020-07-17 DIAGNOSIS — H93292 Other abnormal auditory perceptions, left ear: Secondary | ICD-10-CM | POA: Diagnosis not present

## 2020-08-01 DIAGNOSIS — I10 Essential (primary) hypertension: Secondary | ICD-10-CM | POA: Diagnosis not present

## 2020-08-01 DIAGNOSIS — N1831 Chronic kidney disease, stage 3a: Secondary | ICD-10-CM | POA: Diagnosis not present

## 2020-08-01 DIAGNOSIS — I6523 Occlusion and stenosis of bilateral carotid arteries: Secondary | ICD-10-CM | POA: Diagnosis not present

## 2020-08-01 DIAGNOSIS — I2510 Atherosclerotic heart disease of native coronary artery without angina pectoris: Secondary | ICD-10-CM | POA: Diagnosis not present

## 2020-08-01 DIAGNOSIS — R001 Bradycardia, unspecified: Secondary | ICD-10-CM | POA: Diagnosis not present

## 2020-08-01 DIAGNOSIS — E782 Mixed hyperlipidemia: Secondary | ICD-10-CM | POA: Diagnosis not present

## 2020-09-10 ENCOUNTER — Other Ambulatory Visit: Payer: Self-pay | Admitting: Family Medicine

## 2020-10-04 ENCOUNTER — Ambulatory Visit: Payer: Medicare Other

## 2020-10-04 ENCOUNTER — Telehealth: Payer: Self-pay | Admitting: Family Medicine

## 2020-10-04 DIAGNOSIS — E039 Hypothyroidism, unspecified: Secondary | ICD-10-CM

## 2020-10-04 DIAGNOSIS — N183 Chronic kidney disease, stage 3 unspecified: Secondary | ICD-10-CM

## 2020-10-04 DIAGNOSIS — I10 Essential (primary) hypertension: Secondary | ICD-10-CM

## 2020-10-04 DIAGNOSIS — E1169 Type 2 diabetes mellitus with other specified complication: Secondary | ICD-10-CM

## 2020-10-04 NOTE — Telephone Encounter (Signed)
-----   Message from Ellamae Sia sent at 09/19/2020 12:58 PM EDT ----- Regarding: Lab orders for Friday, 10.15.21 Patient is scheduled for CPX labs, please order future labs, Thanks , Karna Christmas

## 2020-10-05 ENCOUNTER — Other Ambulatory Visit: Payer: Self-pay

## 2020-10-05 ENCOUNTER — Other Ambulatory Visit (INDEPENDENT_AMBULATORY_CARE_PROVIDER_SITE_OTHER): Payer: Medicare PPO

## 2020-10-05 ENCOUNTER — Ambulatory Visit: Payer: Medicare PPO

## 2020-10-05 DIAGNOSIS — I10 Essential (primary) hypertension: Secondary | ICD-10-CM

## 2020-10-05 DIAGNOSIS — N183 Chronic kidney disease, stage 3 unspecified: Secondary | ICD-10-CM | POA: Diagnosis not present

## 2020-10-05 DIAGNOSIS — E039 Hypothyroidism, unspecified: Secondary | ICD-10-CM | POA: Diagnosis not present

## 2020-10-05 DIAGNOSIS — E1122 Type 2 diabetes mellitus with diabetic chronic kidney disease: Secondary | ICD-10-CM | POA: Diagnosis not present

## 2020-10-05 DIAGNOSIS — E785 Hyperlipidemia, unspecified: Secondary | ICD-10-CM

## 2020-10-05 DIAGNOSIS — E1169 Type 2 diabetes mellitus with other specified complication: Secondary | ICD-10-CM | POA: Diagnosis not present

## 2020-10-05 DIAGNOSIS — Z794 Long term (current) use of insulin: Secondary | ICD-10-CM | POA: Diagnosis not present

## 2020-10-05 LAB — CBC WITH DIFFERENTIAL/PLATELET
Basophils Absolute: 0.1 10*3/uL (ref 0.0–0.1)
Basophils Relative: 1 % (ref 0.0–3.0)
Eosinophils Absolute: 0.3 10*3/uL (ref 0.0–0.7)
Eosinophils Relative: 3.5 % (ref 0.0–5.0)
HCT: 35.7 % — ABNORMAL LOW (ref 39.0–52.0)
Hemoglobin: 12.2 g/dL — ABNORMAL LOW (ref 13.0–17.0)
Lymphocytes Relative: 20.8 % (ref 12.0–46.0)
Lymphs Abs: 1.7 10*3/uL (ref 0.7–4.0)
MCHC: 34.1 g/dL (ref 30.0–36.0)
MCV: 103.3 fl — ABNORMAL HIGH (ref 78.0–100.0)
Monocytes Absolute: 0.8 10*3/uL (ref 0.1–1.0)
Monocytes Relative: 9.2 % (ref 3.0–12.0)
Neutro Abs: 5.4 10*3/uL (ref 1.4–7.7)
Neutrophils Relative %: 65.5 % (ref 43.0–77.0)
Platelets: 223 10*3/uL (ref 150.0–400.0)
RBC: 3.45 Mil/uL — ABNORMAL LOW (ref 4.22–5.81)
RDW: 14.2 % (ref 11.5–15.5)
WBC: 8.3 10*3/uL (ref 4.0–10.5)

## 2020-10-05 LAB — COMPREHENSIVE METABOLIC PANEL
ALT: 20 U/L (ref 0–53)
AST: 18 U/L (ref 0–37)
Albumin: 3.9 g/dL (ref 3.5–5.2)
Alkaline Phosphatase: 75 U/L (ref 39–117)
BUN: 32 mg/dL — ABNORMAL HIGH (ref 6–23)
CO2: 28 mEq/L (ref 19–32)
Calcium: 9 mg/dL (ref 8.4–10.5)
Chloride: 108 mEq/L (ref 96–112)
Creatinine, Ser: 2.01 mg/dL — ABNORMAL HIGH (ref 0.40–1.50)
GFR: 27.92 mL/min — ABNORMAL LOW (ref >60.00)
Glucose, Bld: 61 mg/dL — ABNORMAL LOW (ref 70–99)
Potassium: 4.8 mEq/L (ref 3.5–5.1)
Sodium: 143 mEq/L (ref 135–145)
Total Bilirubin: 0.9 mg/dL (ref 0.2–1.2)
Total Protein: 6.1 g/dL (ref 6.0–8.3)

## 2020-10-05 LAB — LIPID PANEL
Cholesterol: 104 mg/dL (ref 0–200)
HDL: 43.9 mg/dL (ref >39.00)
LDL Cholesterol: 47 mg/dL (ref 0–99)
NonHDL: 60
Total CHOL/HDL Ratio: 2
Triglycerides: 64 mg/dL (ref 0.0–149.0)
VLDL: 12.8 mg/dL (ref 0.0–40.0)

## 2020-10-05 LAB — HEMOGLOBIN A1C: Hgb A1c MFr Bld: 7 % — ABNORMAL HIGH (ref 4.6–6.5)

## 2020-10-05 LAB — TSH: TSH: 5.53 u[IU]/mL — ABNORMAL HIGH (ref 0.35–4.50)

## 2020-10-08 ENCOUNTER — Ambulatory Visit (INDEPENDENT_AMBULATORY_CARE_PROVIDER_SITE_OTHER): Payer: Medicare PPO | Admitting: Family Medicine

## 2020-10-08 ENCOUNTER — Other Ambulatory Visit: Payer: Self-pay

## 2020-10-08 ENCOUNTER — Encounter: Payer: Self-pay | Admitting: Family Medicine

## 2020-10-08 VITALS — BP 110/62 | HR 72 | Temp 97.1°F | Ht 67.8 in | Wt 160.4 lb

## 2020-10-08 DIAGNOSIS — Z0000 Encounter for general adult medical examination without abnormal findings: Secondary | ICD-10-CM

## 2020-10-08 DIAGNOSIS — E1122 Type 2 diabetes mellitus with diabetic chronic kidney disease: Secondary | ICD-10-CM

## 2020-10-08 DIAGNOSIS — Z794 Long term (current) use of insulin: Secondary | ICD-10-CM

## 2020-10-08 DIAGNOSIS — N1832 Chronic kidney disease, stage 3b: Secondary | ICD-10-CM | POA: Diagnosis not present

## 2020-10-08 DIAGNOSIS — E1169 Type 2 diabetes mellitus with other specified complication: Secondary | ICD-10-CM | POA: Diagnosis not present

## 2020-10-08 DIAGNOSIS — E039 Hypothyroidism, unspecified: Secondary | ICD-10-CM | POA: Diagnosis not present

## 2020-10-08 DIAGNOSIS — I10 Essential (primary) hypertension: Secondary | ICD-10-CM | POA: Diagnosis not present

## 2020-10-08 DIAGNOSIS — N183 Chronic kidney disease, stage 3 unspecified: Secondary | ICD-10-CM

## 2020-10-08 DIAGNOSIS — E785 Hyperlipidemia, unspecified: Secondary | ICD-10-CM

## 2020-10-08 MED ORDER — LISINOPRIL 20 MG PO TABS
20.0000 mg | ORAL_TABLET | Freq: Every day | ORAL | 3 refills | Status: DC
Start: 2020-10-08 — End: 2021-03-28

## 2020-10-08 MED ORDER — PANTOPRAZOLE SODIUM 40 MG PO TBEC
40.0000 mg | DELAYED_RELEASE_TABLET | Freq: Every day | ORAL | 3 refills | Status: DC
Start: 2020-10-08 — End: 2021-11-19

## 2020-10-08 MED ORDER — ROSUVASTATIN CALCIUM 5 MG PO TABS
5.0000 mg | ORAL_TABLET | ORAL | 3 refills | Status: DC
Start: 2020-10-08 — End: 2021-12-06

## 2020-10-08 MED ORDER — GLIMEPIRIDE 4 MG PO TABS: 4.0000 mg | ORAL_TABLET | Freq: Two times a day (BID) | ORAL | 3 refills | Status: DC

## 2020-10-08 MED ORDER — LEVOTHYROXINE SODIUM 75 MCG PO TABS: 75.0000 ug | ORAL_TABLET | Freq: Every day | ORAL | 3 refills | Status: DC

## 2020-10-08 MED ORDER — SITAGLIPTIN PHOSPHATE 50 MG PO TABS
50.0000 mg | ORAL_TABLET | Freq: Every day | ORAL | 3 refills | Status: DC
Start: 2020-10-08 — End: 2022-01-13

## 2020-10-08 NOTE — Assessment & Plan Note (Signed)
Hypothyroidism  Pt has no clinical changes No change in energy level/ hair or skin/ edema and no tremor  Lab Results  Component Value Date   TSH 5.53 (H) 10/05/2020    Inc levothyroxine dose to 75 mcg  Re check at 3 mo f/u

## 2020-10-08 NOTE — Assessment & Plan Note (Signed)
Reviewed health habits including diet and exercise and skin cancer prevention Reviewed appropriate screening tests for age  Also reviewed health mt list, fam hx and immunization status , as well as social and family history   See HPI Labs reviewed  covid vaccinated and enc booster  Discussed shingrix vaccine  No falls or fractures Adv directive is up to date  No cognitive concerns  Poor hearing in R ear-declines audiology exam  utd eye care , vision 20/50 now

## 2020-10-08 NOTE — Assessment & Plan Note (Signed)
Lab Results  Component Value Date   HGBA1C 7.0 (H) 10/05/2020   Low glucose readings in am -enc a bedtime snack  Takes lantus in am  Plans to continue [place] and amaryl  F/u 3 mo  Disc cutting back on sweets  Taking ace and statin  Eye exam utd

## 2020-10-08 NOTE — Assessment & Plan Note (Signed)
Continues nephrology visits  Stable  Enc good water intake

## 2020-10-08 NOTE — Patient Instructions (Addendum)
Get your covid booster  COVID-19 Vaccine Information can be found at: ShippingScam.co.uk For questions related to vaccine distribution or appointments, please email [email or web address] or call [phone].    If you are interested in the new shingles vaccine (Shingrix) - call your local pharmacy to check on coverage and availability  If affordable, get on a wait list at your pharmacy to get the vaccine.  Please get away from sweets/ice cream  Try to get most of your carbohydrates from produce (with the exception of white potatoes)  Eat less bread/pasta/rice/snack foods/cereals/sweets and other items from the middle of the grocery store (processed carbs)  Please start eating a bedtime snack  Carbohydrate (starch)  and a protein  (crakcer and cheese or fruit and peanut butter)   Go up on thyroid dose (levothyroxine) from 50 to 75 mcg daily  We will re check this in 3 months   Follow up in 3 months for diabetes and thyroid

## 2020-10-08 NOTE — Progress Notes (Signed)
Subjective:    Patient ID: David Henry, male    DOB: 11/15/1926, 86 y.o.   MRN: [id number]  This visit occurred during the SARS-CoV-2 public health emergency.  Safety protocols were in place, including screening questions prior to the visit, additional usage of staff PPE, and extensive cleaning of exam room while observing appropriate contact time as indicated for disinfecting solutions.    HPI Pt presents for amw and health mt exam   I have personally reviewed the Medicare Annual Wellness questionnaire and have noted 1. The patient's medical and social history 2. Their use of alcohol, tobacco or illicit drugs 3. Their current medications and supplements 4. The patient's functional ability including ADL's, fall risks, home safety risks and hearing or visual             impairment. 5. Diet and physical activities 6. Evidence for depression or mood disorders  The patients weight, height, BMI have been recorded in the chart and visual acuity is per eye clinic.  I have made referrals, counseling and provided education to the patient based review of the above and I have provided the pt with a written personalized care plan for preventive services. Reviewed and updated provider list, see scanned forms.  See scanned forms.  Routine anticipatory guidance given to patient.  See health maintenance. Colon cancer screening-out aged  Flu vaccine -had  Tetanus vaccine Tdap 8/19  Pneumovax-completed  Is covid vaccinated , needs booster  Zoster vaccine  zostavax 4/16  Falls-none Fractures-none Supplements -takes mvi  Exercise - a lot of yard work  Prostate cancer screening-out aged Lab Results  Component Value Date   PSA 2.09 05/13/2011   PSA 1.83 08/18/2005  no symptoms right now  Nocturia times one     Advance directive-up to date  Cognitive function addressed- see scanned forms- and if abnormal then additional documentation follows.   No new memory problems Good  comprehension   PMH and SH reviewed  Meds, vitals, and allergies reviewed.   ROS: See HPI.  Otherwise negative.    Weight : Wt Readings from Last 3 Encounters:  10/08/20 160 lb 6.4 oz (72.8 kg)  04/02/20 164 lb (74.4 kg)  10/03/19 163 lb 2 oz (74 kg)  down 4 lb  24.54 kg/m   Hearing/vision:  Hearing Screening   125Hz  250Hz  500Hz  1000Hz  2000Hz  3000Hz  4000Hz  6000Hz  8000Hz   Right ear:   40 0 0  0    Left ear:   40 40 0  0      Visual Acuity Screening   Right eye Left eye Both eyes  Without correction: 20/40 20/50 20/50   With correction:     hearing is bad at higher tones Declines hearing eval/audiology  Up to date eye/vision exam- has next appt on Thursday   Care team [facility]/ Candiss Norse- Nephrology  Nehemiah Massed - cardiology   HTN bp is stable today  No cp or palpitations or headaches or edema  No side effects to medicines  BP Readings from Last 3 Encounters:  10/08/20 110/62  04/02/20 138/70  10/03/19 122/72    Taking lisinopril 20 mg   Pulse Readings from Last 3 Encounters:  10/08/20 72  04/02/20 60  10/03/19 66   Stable CAD  DM2 Lab Results  Component Value Date   HGBA1C 7.0 (H) 10/05/2020  amaryl 4 mg bid lantus 15 ml daily-in am  Januvia 50 mg  Eye exam utd  Foot care is good  On ace and statin  Not watching diet that much  (eating same thing)   Glucose is too low in the ams (59, 60s)   Hyperlipidemia Lab Results  Component Value Date   CHOL 104 10/05/2020   CHOL 110 04/02/2020   CHOL 113 10/03/2019   Lab Results  Component Value Date   HDL 43.90 10/05/2020   HDL 43.60 04/02/2020   HDL 46.50 10/03/2019   Lab Results  Component Value Date   LDLCALC 47 10/05/2020   LDLCALC 53 04/02/2020   LDLCALC 53 10/03/2019   Lab Results  Component Value Date   TRIG 64.0 10/05/2020   TRIG 64.0 04/02/2020   TRIG 70.0 10/03/2019   Lab Results  Component Value Date   CHOLHDL 2 10/05/2020   CHOLHDL 3 04/02/2020   CHOLHDL 2 10/03/2019    No results found for: LDLDIRECT Taking rosuvastatin 5 mg every other day  Hypothyroid  Hypothyroidism  Pt has no clinical changes Has felt sluggish  Lab Results  Component Value Date   TSH 5.53 (H) 10/05/2020    Taking 50 mcg of levothyroxine  No missed doses  CKD Sees nephrology Lab Results  Component Value Date   CREATININE 2.01 (H) 10/05/2020   BUN 32 (H) 10/05/2020   NA 143 10/05/2020   K 4.8 10/05/2020   CL 108 10/05/2020   CO2 28 10/05/2020   Anemia Lab Results  Component Value Date   WBC 8.3 10/05/2020   HGB 12.2 (L) 10/05/2020   HCT 35.7 (L) 10/05/2020   MCV 103.3 (H) 10/05/2020   PLT 223.0 10/05/2020   Not currently taking iron   Lab Results  Component Value Date   ALT 20 10/05/2020   AST 18 10/05/2020   ALKPHOS 75 10/05/2020   BILITOT 0.9 10/05/2020    Patient Active Problem List   Diagnosis Date Noted  . Macrocytosis 10/05/2019  . Routine general medical examination at a health care facility 03/09/2016  . Constipation 09/17/2015  . Encounter for Medicare annual wellness exam 12/08/2014  . Hematuria, microscopic 05/26/2013  . Chronic kidney disease (CKD) 05/24/2013  . Anemia 11/23/2012  . Hypothyroid 11/23/2012  . GERD 11/29/2010  . Coronary atherosclerosis 04/18/2007  . Controlled type 2 diabetes mellitus with chronic kidney disease ([place]) 04/15/2007  . Hyperlipidemia associated with type 2 diabetes mellitus ([place]) 04/15/2007  . Essential hypertension 04/15/2007  . MYOCARDIAL INFARCTION, HX OF 04/15/2007  . ACTINIC KERATOSIS 04/15/2007   Past Medical History:  Diagnosis Date  . Arthritis   . CAD (coronary artery disease)    3 stents  . Colon polyp   . Diabetes mellitus    type II  . Dyspnea    with exertion  . GERD (gastroesophageal reflux disease)   . Hyperlipidemia   . Hypertension   . Hypothyroidism   . Kidney stones    stage III CKD  . Neuropathy   . Peripheral vascular disease (HCC)    neuropathy in feet   Past Surgical  History:  Procedure Laterality Date  . APPENDECTOMY  1984  . CATARACT EXTRACTION Bilateral 04/2001  . CHOLECYSTECTOMY  1989  . COLONOSCOPY N/A 2/14   hemorrhoids and tics (after pos ifob card)  . CORONARY ANGIOPLASTY  2006   3 stents  . ESOPHAGOGASTRODUODENOSCOPY N/A 2/14  . RETINAL DETACHMENT SURGERY Right 2003  . SHOULDER ARTHROSCOPY WITH ROTATOR CUFF REPAIR AND SUBACROMIAL DECOMPRESSION Right 12/30/2017   Procedure: SHOULDER ARTHROSCOPY WITH ROTATOR CUFF REPAIR AND SUBACROMIAL DECOMPRESSION;  Surgeon: Leanor Kail, MD;  Location: [facility] ORS;  Service: Orthopedics;  Laterality: Right;  . SHOULDER SURGERY  2012   Lt shoulder repair   Social History   Tobacco Use  . Smoking status: Never Smoker  . Smokeless tobacco: Never Used  Vaping Use  . Vaping Use: Never used  Substance Use Topics  . Alcohol use: No    Alcohol/week: 0.0 standard drinks  . Drug use: No   Family History  Problem Relation Age of Onset  . Cancer Brother        prostate CA  . Heart disease Mother   . Stroke Father   . Kidney disease Sister   . Kidney disease Sister   . Clotting disorder Sister   . COPD Brother   . Heart disease Brother    Allergies  Allergen Reactions  . Adhesive [Tape] Dermatitis    Skin is thin so it is tough to take off.  Use paper tape  . Azithromycin Nausea Only    REACTION: nausea  . Celecoxib Other (See Comments)    Raises blood pressure  . Zocor [Simvastatin] Other (See Comments)    Muscle pain   Current Outpatient Medications on File Prior to Visit  Medication Sig Dispense Refill  . acetaminophen (TYLENOL ARTHRITIS PAIN) 650 MG CR tablet Take 650 mg by mouth every 8 (eight) hours as needed.      Marland Kitchen aspirin 81 MG tablet Take 81 mg by mouth daily.      . Insulin Glargine (LANTUS SOLOSTAR) 100 UNIT/ML Solostar Pen INJECT 20 UNITS INTO THE SKIN EVERY MORNING 15 mL 3  . Multiple Vitamin (MULTIVITAMIN) capsule Take 1 capsule by mouth daily.      . NON FORMULARY 100 BC ultra  fine needles    . ONETOUCH ULTRA test strip CHECK BLOOD SUGAR TWICE DAILY AND AS DIRECTED FROM DM 200 each 1  . senna (SENOKOT) 8.6 MG tablet Take 1 tablet by mouth 2 (two) times daily.    . tamsulosin (FLOMAX) 0.4 MG CAPS capsule Take 1 capsule (0.4 mg total) by mouth daily. 90 capsule 3  . ULTICARE MICRO PEN NEEDLES 32G X 4 MM MISC USE AS DIRECTED WITH LANTUS 100 each 1  . [DISCONTINUED] omeprazole (PRILOSEC OTC) 20 MG tablet Take 1 tablet (20 mg total) by mouth daily. 90 tablet 1   No current facility-administered medications on file prior to visit.    Review of Systems  Constitutional: Positive for fatigue. Negative for activity change, appetite change, fever and unexpected weight change.  HENT: Negative for congestion, rhinorrhea, sore throat and trouble swallowing.   Eyes: Negative for pain, redness, itching and visual disturbance.  Respiratory: Negative for cough, chest tightness, shortness of breath and wheezing.   Cardiovascular: Negative for chest pain and palpitations.  Gastrointestinal: Negative for abdominal pain, blood in stool, constipation, diarrhea and nausea.  Endocrine: Negative for cold intolerance, heat intolerance, polydipsia and polyuria.  Genitourinary: Negative for difficulty urinating, dysuria, frequency and urgency.  Musculoskeletal: Negative for arthralgias, joint swelling and myalgias.  Skin: Negative for pallor and rash.  Neurological: Negative for dizziness, tremors, weakness, numbness and headaches.  Hematological: Negative for adenopathy. Does not bruise/bleed easily.  Psychiatric/Behavioral: Negative for decreased concentration and dysphoric mood. The patient is not nervous/anxious.        Objective:   Physical Exam Constitutional:      General: He is not in acute distress.    Appearance: Normal appearance. He is well-developed and normal weight. He is not ill-appearing or diaphoretic.  HENT:  Head: Normocephalic and atraumatic.     Right Ear:  Tympanic membrane, ear canal and external ear normal.     Left Ear: Tympanic membrane, ear canal and external ear normal.     Nose: Nose normal. No congestion.     Mouth/Throat:     Mouth: Mucous membranes are moist.     Pharynx: Oropharynx is clear. No posterior oropharyngeal erythema.  Eyes:     General: No scleral icterus.       Right eye: No discharge.        Left eye: No discharge.     Conjunctiva/sclera: Conjunctivae normal.     Pupils: Pupils are equal, round, and reactive to light.  Neck:     Thyroid: No thyromegaly.     Vascular: No carotid bruit or JVD.  Cardiovascular:     Rate and Rhythm: Normal rate and regular rhythm.     Pulses: Normal pulses.     Heart sounds: Normal heart sounds. No gallop.   Pulmonary:     Effort: Pulmonary effort is normal. No respiratory distress.     Breath sounds: Normal breath sounds. No wheezing or rales.     Comments: Good air exch Chest:     Chest wall: No tenderness.  Abdominal:     General: Bowel sounds are normal. There is no distension or abdominal bruit.     Palpations: Abdomen is soft. There is no mass.     Tenderness: There is no abdominal tenderness.     Hernia: No hernia is present.  Musculoskeletal:        General: No tenderness.     Cervical back: Normal range of motion and neck supple. No rigidity. No muscular tenderness.     Right lower leg: No edema.     Left lower leg: No edema.     Comments: Mild kyphosis   Lymphadenopathy:     Cervical: No cervical adenopathy.  Skin:    General: Skin is warm and dry.     Coloration: Skin is not pale.     Findings: No erythema or rash.     Comments: Solar lentigines diffusely  sks and aks  Solar aging   Neurological:     Mental Status: He is alert.     Cranial Nerves: No cranial nerve deficit.     Motor: No abnormal muscle tone.     Coordination: Coordination normal.     Gait: Gait normal.     Deep Tendon Reflexes: Reflexes are normal and symmetric. Reflexes normal.   Psychiatric:        Mood and Affect: Mood normal.        Cognition and Memory: Cognition and memory normal.           Assessment & Plan:   Problem List Items Addressed This Visit      Cardiovascular and Mediastinum   Essential hypertension    bp is stable today  No cp or palpitations or headaches or edema  No side effects to medicines  BP Readings from Last 3 Encounters:  10/08/20 110/62  04/02/20 138/70  10/03/19 122/72  will continue lisinopril 20 mg daily      Relevant Medications   lisinopril (ZESTRIL) 20 MG tablet   rosuvastatin (CRESTOR) 5 MG tablet     Endocrine   Controlled type 2 diabetes mellitus with chronic kidney disease ([place])    Lab Results  Component Value Date   HGBA1C 7.0 (H) 10/05/2020   Low glucose readings in am -  enc a bedtime snack  Takes lantus in am  Plans to continue [place] and amaryl  F/u 3 mo  Disc cutting back on sweets  Taking ace and statin  Eye exam utd       Relevant Medications   glimepiride (AMARYL) 4 MG tablet   lisinopril (ZESTRIL) 20 MG tablet   rosuvastatin (CRESTOR) 5 MG tablet   sitaGLIPtin (JANUVIA) 50 MG tablet   Hyperlipidemia associated with type 2 diabetes mellitus (HCC)    Disc goals for lipids and reasons to control them Rev last labs with pt Rev low sat fat diet in detail Rosuvastatin and diet       Relevant Medications   glimepiride (AMARYL) 4 MG tablet   lisinopril (ZESTRIL) 20 MG tablet   rosuvastatin (CRESTOR) 5 MG tablet   sitaGLIPtin (JANUVIA) 50 MG tablet   Hypothyroid    Hypothyroidism  Pt has no clinical changes No change in energy level/ hair or skin/ edema and no tremor  Lab Results  Component Value Date   TSH 5.53 (H) 10/05/2020    Inc levothyroxine dose to 75 mcg  Re check at 3 mo f/u      Relevant Medications   levothyroxine (SYNTHROID) 75 MCG tablet     Genitourinary   Chronic kidney disease (CKD)    Continues nephrology visits  Stable  Enc good water intake          Other   Encounter for Medicare annual wellness exam - Primary    Reviewed health habits including diet and exercise and skin cancer prevention Reviewed appropriate screening tests for age  Also reviewed health mt list, fam hx and immunization status , as well as social and family history   See HPI Labs reviewed  covid vaccinated and enc booster  Discussed shingrix vaccine  No falls or fractures Adv directive is up to date  No cognitive concerns  Poor hearing in R ear-declines audiology exam  utd eye care , vision 20/50 now       Routine general medical examination at a health care facility    Reviewed health habits including diet and exercise and skin cancer prevention Reviewed appropriate screening tests for age  Also reviewed health mt list, fam hx and immunization status , as well as social and family history   See HPI Labs reviewed  covid vaccinated and enc booster  Discussed shingrix vaccine  No falls or fractures Adv directive is up to date  No cognitive concerns  Poor hearing in R ear-declines audiology exam  utd eye care , vision 20/50 now

## 2020-10-08 NOTE — Assessment & Plan Note (Signed)
Disc goals for lipids and reasons to control them Rev last labs with pt Rev low sat fat diet in detail Rosuvastatin and diet

## 2020-10-08 NOTE — Assessment & Plan Note (Signed)
bp is stable today  No cp or palpitations or headaches or edema  No side effects to medicines  BP Readings from Last 3 Encounters:  10/08/20 110/62  04/02/20 138/70  10/03/19 122/72  will continue lisinopril 20 mg daily

## 2020-10-09 ENCOUNTER — Telehealth: Payer: Self-pay | Admitting: Family Medicine

## 2020-10-09 NOTE — Telephone Encounter (Signed)
Pt's wife called to update you on his covid vaccine.  He rec'd [employer] vaccine 1st done 01/17/2020 and 2nd dose 02/07/2020.  He has not yet rec'd the booster.

## 2020-10-09 NOTE — Telephone Encounter (Signed)
Thanks-please abstract  Please make sure we also have their flu shot dates (I can't remember) for he and wife

## 2020-10-09 NOTE — Telephone Encounter (Signed)
Chart's updated and flu shot is already in chart

## 2020-10-11 DIAGNOSIS — H35373 Puckering of macula, bilateral: Secondary | ICD-10-CM | POA: Diagnosis not present

## 2020-10-11 DIAGNOSIS — E119 Type 2 diabetes mellitus without complications: Secondary | ICD-10-CM | POA: Diagnosis not present

## 2020-10-11 LAB — HM DIABETES EYE EXAM

## 2020-10-12 ENCOUNTER — Other Ambulatory Visit: Payer: Self-pay | Admitting: Family Medicine

## 2020-10-15 ENCOUNTER — Ambulatory Visit: Payer: Medicare PPO | Attending: Internal Medicine

## 2020-10-15 DIAGNOSIS — Z23 Encounter for immunization: Secondary | ICD-10-CM

## 2020-10-15 NOTE — Progress Notes (Signed)
   Covid-19 Vaccination Clinic  Name:  David Henry    MRN: [id number] DOB: 09-24-1926  10/15/2020  [name] was observed post Covid-19 immunization for 15 minutes without incident. He was provided with Vaccine Information Sheet and instruction to access the V-Safe system.   [name] was instructed to call 911 with any severe reactions post vaccine: Marland Kitchen Difficulty breathing  . Swelling of face and throat  . A fast heartbeat  . A bad rash all over body  . Dizziness and weakness

## 2020-10-18 ENCOUNTER — Encounter: Payer: Self-pay | Admitting: Family Medicine

## 2020-10-18 DIAGNOSIS — E875 Hyperkalemia: Secondary | ICD-10-CM | POA: Diagnosis not present

## 2020-10-18 DIAGNOSIS — I10 Essential (primary) hypertension: Secondary | ICD-10-CM | POA: Diagnosis not present

## 2020-10-18 DIAGNOSIS — E1129 Type 2 diabetes mellitus with other diabetic kidney complication: Secondary | ICD-10-CM | POA: Diagnosis not present

## 2020-10-18 DIAGNOSIS — N1832 Chronic kidney disease, stage 3b: Secondary | ICD-10-CM | POA: Diagnosis not present

## 2020-10-25 DIAGNOSIS — N1832 Chronic kidney disease, stage 3b: Secondary | ICD-10-CM | POA: Diagnosis not present

## 2020-10-25 DIAGNOSIS — E1129 Type 2 diabetes mellitus with other diabetic kidney complication: Secondary | ICD-10-CM | POA: Diagnosis not present

## 2020-10-25 DIAGNOSIS — E875 Hyperkalemia: Secondary | ICD-10-CM | POA: Diagnosis not present

## 2020-10-25 DIAGNOSIS — I10 Essential (primary) hypertension: Secondary | ICD-10-CM | POA: Diagnosis not present

## 2020-11-09 ENCOUNTER — Ambulatory Visit: Payer: Medicare PPO

## 2020-11-09 ENCOUNTER — Other Ambulatory Visit: Payer: Self-pay

## 2020-12-04 ENCOUNTER — Other Ambulatory Visit: Payer: Self-pay | Admitting: Family Medicine

## 2020-12-18 ENCOUNTER — Other Ambulatory Visit: Payer: Self-pay | Admitting: Family Medicine

## 2021-01-09 ENCOUNTER — Telehealth: Payer: Self-pay | Admitting: *Deleted

## 2021-01-09 ENCOUNTER — Ambulatory Visit: Payer: Medicare PPO | Admitting: Family Medicine

## 2021-01-09 NOTE — Telephone Encounter (Signed)
PA for pt's test strips done at [email or web address]. com, I will await a response

## 2021-01-10 ENCOUNTER — Ambulatory Visit: Payer: Medicare PPO | Admitting: Family Medicine

## 2021-01-10 ENCOUNTER — Encounter: Payer: Self-pay | Admitting: Family Medicine

## 2021-01-10 ENCOUNTER — Other Ambulatory Visit: Payer: Self-pay

## 2021-01-10 ENCOUNTER — Other Ambulatory Visit: Payer: Self-pay | Admitting: Family Medicine

## 2021-01-10 VITALS — BP 136/70 | HR 51 | Temp 97.3°F | Ht 67.5 in | Wt 163.6 lb

## 2021-01-10 DIAGNOSIS — N1832 Chronic kidney disease, stage 3b: Secondary | ICD-10-CM

## 2021-01-10 DIAGNOSIS — I10 Essential (primary) hypertension: Secondary | ICD-10-CM

## 2021-01-10 DIAGNOSIS — E039 Hypothyroidism, unspecified: Secondary | ICD-10-CM | POA: Diagnosis not present

## 2021-01-10 DIAGNOSIS — E1122 Type 2 diabetes mellitus with diabetic chronic kidney disease: Secondary | ICD-10-CM | POA: Diagnosis not present

## 2021-01-10 DIAGNOSIS — N183 Chronic kidney disease, stage 3 unspecified: Secondary | ICD-10-CM | POA: Diagnosis not present

## 2021-01-10 DIAGNOSIS — Z794 Long term (current) use of insulin: Secondary | ICD-10-CM

## 2021-01-10 LAB — POCT GLYCOSYLATED HEMOGLOBIN (HGB A1C): Hemoglobin A1C: 6.8 % — AB (ref 4.0–5.6)

## 2021-01-10 LAB — BASIC METABOLIC PANEL
BUN: 26 mg/dL — ABNORMAL HIGH (ref 6–23)
CO2: 27 mEq/L (ref 19–32)
Calcium: 9.5 mg/dL (ref 8.4–10.5)
Chloride: 109 mEq/L (ref 96–112)
Creatinine, Ser: 1.69 mg/dL — ABNORMAL HIGH (ref 0.40–1.50)
GFR: 34.81 mL/min — ABNORMAL LOW (ref >60.00)
Glucose, Bld: 181 mg/dL — ABNORMAL HIGH (ref 70–99)
Potassium: 4.9 mEq/L (ref 3.5–5.1)
Sodium: 141 mEq/L (ref 135–145)

## 2021-01-10 LAB — TSH: TSH: 0.27 u[IU]/mL — ABNORMAL LOW (ref 0.35–4.50)

## 2021-01-10 NOTE — Telephone Encounter (Signed)
Last OV 01/10/21 Last refill 01/02/20 #200/1 Next OV 10/14/21

## 2021-01-10 NOTE — Telephone Encounter (Addendum)
PA was approved form placed on PCP's inbox for review and sign and send for scanning, pharmacy notified

## 2021-01-10 NOTE — Assessment & Plan Note (Signed)
Sees nephrology  Enc good fluid intake  Last cr 2.0

## 2021-01-10 NOTE — Assessment & Plan Note (Signed)
bp in fair control at this time  BP Readings from Last 1 Encounters:  01/10/21 136/70   No changes needed Most recent labs reviewed  Disc lifstyle change with low sodium diet and exercise  Plan to continue lisinopril 20 mg daily

## 2021-01-10 NOTE — Patient Instructions (Addendum)
If you cannot eat a snack at night let us know (we will need to cut back on medicine)  A piece of fruit and peanut butter is a good idea (make sure you get protein)   Let me know in 1-2 weeks how your blood sugar is  I want your morning glucose to be over 70  Call us   Check some afternoons here and there also    a1c is improved

## 2021-01-10 NOTE — Assessment & Plan Note (Signed)
TSH today  °No clinical changes  °Taking levothyroxine 75 mcg daily °

## 2021-01-10 NOTE — Assessment & Plan Note (Signed)
Lab Results  Component Value Date   HGBA1C 6.8 (A) 01/10/2021   Improved Worrisome low glucose in am  Adv strongly to eat bedtime snack with protein  Will update with glucose readings in 1-2 wk  If no imp may consider dec amaryl or lantus  Taking ace and statin and eye exam is utd

## 2021-01-10 NOTE — Progress Notes (Signed)
Subjective:    Patient ID: David Henry, male    DOB: 01-25-1926, 87 y.o.   MRN: [id number]  This visit occurred during the SARS-CoV-2 public health emergency.  Safety protocols were in place, including screening questions prior to the visit, additional usage of staff PPE, and extensive cleaning of exam room while observing appropriate contact time as indicated for disinfecting solutions.    HPI Pt presents for f/u of chronic health problems  Wt Readings from Last 3 Encounters:  01/10/21 163 lb 9 oz (74.2 kg)  10/08/20 160 lb 6.4 oz (72.8 kg)  04/02/20 164 lb (74.4 kg)   25.24 kg/m  Feeling pretty good for his age  Still goes up and down stairs  Balance is not as good    Hypothyroid  Lab Results  Component Value Date   TSH 5.53 (H) 10/05/2020    We inc his levothyroxine to 75 mcg last time Due for re check today  No change in how he feels   HTN bp is stable today  No cp or palpitations or headaches or edema  No side effects to medicines  BP Readings from Last 3 Encounters:  01/10/21 136/70  10/08/20 110/62  04/02/20 138/70    Takes lisinopril  20 mg daily  CKD Lab Results  Component Value Date   CREATININE 2.01 (H) 10/05/2020   BUN 32 (H) 10/05/2020   NA 143 10/05/2020   K 4.8 10/05/2020   CL 108 10/05/2020   CO2 28 10/05/2020  that was up     DM2 Lab Results  Component Value Date   HGBA1C 7.0 (H) 10/05/2020   This was up   Today Lab Results  Component Value Date   HGBA1C 6.8 (A) 01/10/2021   Less exercise now than in the summer   amaryl 4 mg bid lantus 15 u daily januvia 50 mg daily   Glucose is great in the am   occ low -down below 70  This is almost every am 70s or lower   Takes ace and statin  Eye exam 10/21  Patient Active Problem List   Diagnosis Date Noted  . Macrocytosis 10/05/2019  . Routine general medical examination at a health care facility 03/09/2016  . Constipation 09/17/2015  . Encounter for Medicare annual  wellness exam 12/08/2014  . Hematuria, microscopic 05/26/2013  . Chronic kidney disease (CKD) 05/24/2013  . Anemia 11/23/2012  . Hypothyroid 11/23/2012  . GERD 11/29/2010  . Coronary atherosclerosis 04/18/2007  . Controlled type 2 diabetes mellitus with chronic kidney disease ([place]) 04/15/2007  . Hyperlipidemia associated with type 2 diabetes mellitus ([place]) 04/15/2007  . Essential hypertension 04/15/2007  . MYOCARDIAL INFARCTION, HX OF 04/15/2007  . ACTINIC KERATOSIS 04/15/2007   Past Medical History:  Diagnosis Date  . Arthritis   . CAD (coronary artery disease)    3 stents  . Colon polyp   . Diabetes mellitus    type II  . Dyspnea    with exertion  . GERD (gastroesophageal reflux disease)   . Hyperlipidemia   . Hypertension   . Hypothyroidism   . Kidney stones    stage III CKD  . Neuropathy   . Peripheral vascular disease (HCC)    neuropathy in feet   Past Surgical History:  Procedure Laterality Date  . APPENDECTOMY  1984  . CATARACT EXTRACTION Bilateral 04/2001  . CHOLECYSTECTOMY  1989  . COLONOSCOPY N/A 2/14   hemorrhoids and tics (after pos ifob card)  . CORONARY ANGIOPLASTY  2006   3 stents  . ESOPHAGOGASTRODUODENOSCOPY N/A 2/14  . RETINAL DETACHMENT SURGERY Right 2003  . SHOULDER ARTHROSCOPY WITH ROTATOR CUFF REPAIR AND SUBACROMIAL DECOMPRESSION Right 12/30/2017   Procedure: SHOULDER ARTHROSCOPY WITH ROTATOR CUFF REPAIR AND SUBACROMIAL DECOMPRESSION;  Surgeon: Leanor Kail, MD;  Location: [facility] ORS;  Service: Orthopedics;  Laterality: Right;  . SHOULDER SURGERY  2012   Lt shoulder repair   Social History   Tobacco Use  . Smoking status: Never Smoker  . Smokeless tobacco: Never Used  Vaping Use  . Vaping Use: Never used  Substance Use Topics  . Alcohol use: No    Alcohol/week: 0.0 standard drinks  . Drug use: No   Family History  Problem Relation Age of Onset  . Cancer Brother        prostate CA  . Heart disease Mother   . Stroke Father   .  Kidney disease Sister   . Kidney disease Sister   . Clotting disorder Sister   . COPD Brother   . Heart disease Brother    Allergies  Allergen Reactions  . Adhesive [Tape] Dermatitis    Skin is thin so it is tough to take off.  Use paper tape  . Azithromycin Nausea Only    REACTION: nausea  . Celecoxib Other (See Comments)    Raises blood pressure  . Zocor [Simvastatin] Other (See Comments)    Muscle pain   Current Outpatient Medications on File Prior to Visit  Medication Sig Dispense Refill  . acetaminophen (TYLENOL) 650 MG CR tablet Take 650 mg by mouth every 8 (eight) hours as needed.    Marland Kitchen aspirin 81 MG tablet Take 81 mg by mouth daily.    Marland Kitchen glimepiride (AMARYL) 4 MG tablet Take 1 tablet (4 mg total) by mouth 2 (two) times daily. 180 tablet 3  . Insulin Glargine (LANTUS SOLOSTAR) 100 UNIT/ML Solostar Pen INJECT 20 UNITS INTO THE SKIN EVERY MORNING 15 mL 3  . levothyroxine (SYNTHROID) 75 MCG tablet Take 1 tablet (75 mcg total) by mouth daily. 90 tablet 3  . lisinopril (ZESTRIL) 20 MG tablet Take 1 tablet (20 mg total) by mouth daily. 90 tablet 3  . Multiple Vitamin (MULTIVITAMIN) capsule Take 1 capsule by mouth daily.    . NON FORMULARY 100 BC ultra fine needles    . pantoprazole (PROTONIX) 40 MG tablet Take 1 tablet (40 mg total) by mouth daily. 90 tablet 3  . rosuvastatin (CRESTOR) 5 MG tablet Take 1 tablet (5 mg total) by mouth every other day. 45 tablet 3  . senna (SENOKOT) 8.6 MG tablet Take 1 tablet by mouth 2 (two) times daily.    . sitaGLIPtin (JANUVIA) 50 MG tablet Take 1 tablet (50 mg total) by mouth daily. 90 tablet 3  . tamsulosin (FLOMAX) 0.4 MG CAPS capsule TAKE 1 CAPSULE BY MOUTH ONCE DAILY 90 capsule 3  . ULTICARE MICRO PEN NEEDLES 32G X 4 MM MISC USE AS DIRECTED WITH LANTUS 100 each 1  . [DISCONTINUED] omeprazole (PRILOSEC OTC) 20 MG tablet Take 1 tablet (20 mg total) by mouth daily. 90 tablet 1   No current facility-administered medications on file prior to  visit.    Review of Systems  Constitutional: Positive for fatigue. Negative for activity change, appetite change, fever and unexpected weight change.  HENT: Negative for congestion, rhinorrhea, sore throat and trouble swallowing.   Eyes: Negative for pain, redness, itching and visual disturbance.  Respiratory: Negative for cough, chest tightness, shortness of  breath and wheezing.   Cardiovascular: Negative for chest pain and palpitations.  Gastrointestinal: Negative for abdominal pain, blood in stool, constipation, diarrhea and nausea.  Endocrine: Negative for cold intolerance, heat intolerance, polydipsia and polyuria.  Genitourinary: Negative for difficulty urinating, dysuria, frequency and urgency.  Musculoskeletal: Negative for arthralgias, joint swelling and myalgias.  Skin: Negative for pallor and rash.  Neurological: Negative for dizziness, tremors, weakness, numbness and headaches.  Hematological: Negative for adenopathy. Does not bruise/bleed easily.  Psychiatric/Behavioral: Negative for decreased concentration and dysphoric mood. The patient is not nervous/anxious.        Objective:   Physical Exam Constitutional:      General: He is not in acute distress.    Appearance: Normal appearance. He is well-developed, normal weight and well-nourished. He is not ill-appearing.  HENT:     Head: Normocephalic and atraumatic.     Mouth/Throat:     Mouth: Oropharynx is clear and moist.  Eyes:     Extraocular Movements: EOM normal.     Conjunctiva/sclera: Conjunctivae normal.     Pupils: Pupils are equal, round, and reactive to light.  Neck:     Thyroid: No thyromegaly.     Vascular: No carotid bruit or JVD.  Cardiovascular:     Rate and Rhythm: Normal rate and regular rhythm.     Pulses: Intact distal pulses.     Heart sounds: Normal heart sounds. No gallop.   Pulmonary:     Effort: Pulmonary effort is normal. No respiratory distress.     Breath sounds: Normal breath sounds. No  wheezing or rales.     Comments: No crackles Abdominal:     General: Bowel sounds are normal. There is no distension or abdominal bruit.     Palpations: Abdomen is soft. There is no mass.     Tenderness: There is no abdominal tenderness.  Musculoskeletal:        General: No edema.     Cervical back: Normal range of motion and neck supple.  Lymphadenopathy:     Cervical: No cervical adenopathy.  Skin:    General: Skin is warm and dry.     Coloration: Skin is not pale.     Findings: No rash.  Neurological:     Mental Status: He is alert.     Sensory: No sensory deficit.     Coordination: Coordination normal.     Deep Tendon Reflexes: Reflexes are normal and symmetric.  Psychiatric:        Mood and Affect: Mood and affect and mood normal.           Assessment & Plan:   Problem List Items Addressed This Visit      Cardiovascular and Mediastinum   Essential hypertension    bp in fair control at this time  BP Readings from Last 1 Encounters:  01/10/21 136/70   No changes needed Most recent labs reviewed  Disc lifstyle change with low sodium diet and exercise  Plan to continue lisinopril 20 mg daily       Relevant Orders   Basic metabolic panel (Completed)     Endocrine   Controlled type 2 diabetes mellitus with chronic kidney disease ([place]) - Primary    Lab Results  Component Value Date   HGBA1C 6.8 (A) 01/10/2021   Improved Worrisome low glucose in am  Adv strongly to eat bedtime snack with protein  Will update with glucose readings in 1-2 wk  If no imp may consider dec amaryl or  lantus  Taking ace and statin and eye exam is utd       Relevant Orders   POCT glycosylated hemoglobin (Hb A1C) (Completed)   Hypothyroid    TSH today  No clinical changes  Taking levothyroxine 75 mcg daily       Relevant Orders   TSH (Completed)     Genitourinary   Chronic kidney disease (CKD)    Sees nephrology  Enc good fluid intake  Last cr 2.0

## 2021-01-14 ENCOUNTER — Telehealth: Payer: Self-pay

## 2021-01-14 MED ORDER — LEVOTHYROXINE SODIUM 50 MCG PO TABS
50.0000 ug | ORAL_TABLET | Freq: Every day | ORAL | 1 refills | Status: DC
Start: 2021-01-14 — End: 2021-08-16

## 2021-01-14 NOTE — Telephone Encounter (Signed)
Someone was helping me with my inbox and didn't realized they were suppose to send Rx.   Rx sent to pharmacy and pt's wife notified

## 2021-01-14 NOTE — Telephone Encounter (Signed)
Patient's wife is calling in [place] they saw Dr.Tower last week and she changed his medicine levothyroxine (SYNTHROID) 75 MCG tablet from 4 MCG to 105 MCG, but the prescription was never sent to the pharmacy.

## 2021-01-30 ENCOUNTER — Other Ambulatory Visit: Payer: Self-pay | Admitting: Family Medicine

## 2021-02-04 DIAGNOSIS — R001 Bradycardia, unspecified: Secondary | ICD-10-CM | POA: Diagnosis not present

## 2021-02-04 DIAGNOSIS — I340 Nonrheumatic mitral (valve) insufficiency: Secondary | ICD-10-CM | POA: Diagnosis not present

## 2021-02-04 DIAGNOSIS — E782 Mixed hyperlipidemia: Secondary | ICD-10-CM | POA: Diagnosis not present

## 2021-02-04 DIAGNOSIS — I2510 Atherosclerotic heart disease of native coronary artery without angina pectoris: Secondary | ICD-10-CM | POA: Diagnosis not present

## 2021-02-04 DIAGNOSIS — I77810 Thoracic aortic ectasia: Secondary | ICD-10-CM | POA: Diagnosis not present

## 2021-02-04 DIAGNOSIS — I10 Essential (primary) hypertension: Secondary | ICD-10-CM | POA: Diagnosis not present

## 2021-02-04 DIAGNOSIS — I6523 Occlusion and stenosis of bilateral carotid arteries: Secondary | ICD-10-CM | POA: Diagnosis not present

## 2021-02-10 ENCOUNTER — Telehealth: Payer: Self-pay | Admitting: Family Medicine

## 2021-02-10 DIAGNOSIS — E039 Hypothyroidism, unspecified: Secondary | ICD-10-CM

## 2021-02-10 NOTE — Telephone Encounter (Signed)
-----   Message from Cloyd Stagers, RT sent at 01/28/2021  1:33 PM EST ----- Regarding: Lab Orders for Monday 2.21.2022 Please place lab orders for Monday 2.21.2022, appt notes state "TSH" Thank you, Dyke Maes RT(R)

## 2021-02-11 ENCOUNTER — Other Ambulatory Visit: Payer: Self-pay

## 2021-02-11 ENCOUNTER — Other Ambulatory Visit (INDEPENDENT_AMBULATORY_CARE_PROVIDER_SITE_OTHER): Payer: Medicare PPO

## 2021-02-11 DIAGNOSIS — E039 Hypothyroidism, unspecified: Secondary | ICD-10-CM

## 2021-02-11 LAB — TSH: TSH: 2.44 u[IU]/mL (ref 0.35–4.50)

## 2021-03-22 ENCOUNTER — Inpatient Hospital Stay
Admission: EM | Admit: 2021-03-22 | Discharge: 2021-03-28 | DRG: 660 | Disposition: A | Discharge status: Home / Self Care | Payer: Medicare PPO | Attending: Internal Medicine | Admitting: Internal Medicine

## 2021-03-22 ENCOUNTER — Other Ambulatory Visit: Payer: Self-pay

## 2021-03-22 ENCOUNTER — Emergency Department: Payer: Medicare PPO

## 2021-03-22 DIAGNOSIS — I252 Old myocardial infarction: Secondary | ICD-10-CM

## 2021-03-22 DIAGNOSIS — N400 Enlarged prostate without lower urinary tract symptoms: Secondary | ICD-10-CM | POA: Diagnosis present

## 2021-03-22 DIAGNOSIS — I6523 Occlusion and stenosis of bilateral carotid arteries: Secondary | ICD-10-CM | POA: Diagnosis present

## 2021-03-22 DIAGNOSIS — I10 Essential (primary) hypertension: Secondary | ICD-10-CM | POA: Diagnosis not present

## 2021-03-22 DIAGNOSIS — N179 Acute kidney failure, unspecified: Secondary | ICD-10-CM

## 2021-03-22 DIAGNOSIS — N183 Chronic kidney disease, stage 3 unspecified: Secondary | ICD-10-CM | POA: Diagnosis not present

## 2021-03-22 DIAGNOSIS — Z794 Long term (current) use of insulin: Secondary | ICD-10-CM

## 2021-03-22 DIAGNOSIS — R310 Gross hematuria: Secondary | ICD-10-CM | POA: Diagnosis not present

## 2021-03-22 DIAGNOSIS — I2510 Atherosclerotic heart disease of native coronary artery without angina pectoris: Secondary | ICD-10-CM | POA: Diagnosis present

## 2021-03-22 DIAGNOSIS — N281 Cyst of kidney, acquired: Secondary | ICD-10-CM | POA: Diagnosis not present

## 2021-03-22 DIAGNOSIS — D509 Iron deficiency anemia, unspecified: Secondary | ICD-10-CM | POA: Diagnosis present

## 2021-03-22 DIAGNOSIS — Z9049 Acquired absence of other specified parts of digestive tract: Secondary | ICD-10-CM

## 2021-03-22 DIAGNOSIS — E119 Type 2 diabetes mellitus without complications: Secondary | ICD-10-CM | POA: Diagnosis not present

## 2021-03-22 DIAGNOSIS — E039 Hypothyroidism, unspecified: Secondary | ICD-10-CM | POA: Diagnosis present

## 2021-03-22 DIAGNOSIS — R3915 Urgency of urination: Secondary | ICD-10-CM | POA: Diagnosis present

## 2021-03-22 DIAGNOSIS — D631 Anemia in chronic kidney disease: Secondary | ICD-10-CM | POA: Diagnosis present

## 2021-03-22 DIAGNOSIS — Z7984 Long term (current) use of oral hypoglycemic drugs: Secondary | ICD-10-CM

## 2021-03-22 DIAGNOSIS — N1832 Chronic kidney disease, stage 3b: Secondary | ICD-10-CM | POA: Diagnosis present

## 2021-03-22 DIAGNOSIS — Z20822 Contact with and (suspected) exposure to covid-19: Secondary | ICD-10-CM | POA: Diagnosis present

## 2021-03-22 DIAGNOSIS — N132 Hydronephrosis with renal and ureteral calculous obstruction: Secondary | ICD-10-CM | POA: Diagnosis present

## 2021-03-22 DIAGNOSIS — D72828 Other elevated white blood cell count: Secondary | ICD-10-CM | POA: Diagnosis not present

## 2021-03-22 DIAGNOSIS — E1140 Type 2 diabetes mellitus with diabetic neuropathy, unspecified: Secondary | ICD-10-CM | POA: Diagnosis not present

## 2021-03-22 DIAGNOSIS — E11649 Type 2 diabetes mellitus with hypoglycemia without coma: Secondary | ICD-10-CM | POA: Diagnosis not present

## 2021-03-22 DIAGNOSIS — K219 Gastro-esophageal reflux disease without esophagitis: Secondary | ICD-10-CM | POA: Diagnosis present

## 2021-03-22 DIAGNOSIS — E785 Hyperlipidemia, unspecified: Secondary | ICD-10-CM | POA: Diagnosis present

## 2021-03-22 DIAGNOSIS — N201 Calculus of ureter: Secondary | ICD-10-CM | POA: Diagnosis not present

## 2021-03-22 DIAGNOSIS — N200 Calculus of kidney: Secondary | ICD-10-CM

## 2021-03-22 DIAGNOSIS — I129 Hypertensive chronic kidney disease with stage 1 through stage 4 chronic kidney disease, or unspecified chronic kidney disease: Secondary | ICD-10-CM | POA: Diagnosis present

## 2021-03-22 DIAGNOSIS — E1122 Type 2 diabetes mellitus with diabetic chronic kidney disease: Secondary | ICD-10-CM | POA: Diagnosis present

## 2021-03-22 DIAGNOSIS — Z7982 Long term (current) use of aspirin: Secondary | ICD-10-CM | POA: Diagnosis not present

## 2021-03-22 DIAGNOSIS — Z79890 Hormone replacement therapy (postmenopausal): Secondary | ICD-10-CM

## 2021-03-22 DIAGNOSIS — E1142 Type 2 diabetes mellitus with diabetic polyneuropathy: Secondary | ICD-10-CM | POA: Diagnosis present

## 2021-03-22 DIAGNOSIS — Z960 Presence of urogenital implants: Secondary | ICD-10-CM | POA: Diagnosis not present

## 2021-03-22 DIAGNOSIS — N210 Calculus in bladder: Secondary | ICD-10-CM | POA: Diagnosis not present

## 2021-03-22 DIAGNOSIS — N189 Chronic kidney disease, unspecified: Secondary | ICD-10-CM | POA: Diagnosis present

## 2021-03-22 DIAGNOSIS — E1151 Type 2 diabetes mellitus with diabetic peripheral angiopathy without gangrene: Secondary | ICD-10-CM | POA: Diagnosis present

## 2021-03-22 DIAGNOSIS — Z955 Presence of coronary angioplasty implant and graft: Secondary | ICD-10-CM

## 2021-03-22 DIAGNOSIS — K4090 Unilateral inguinal hernia, without obstruction or gangrene, not specified as recurrent: Secondary | ICD-10-CM | POA: Diagnosis not present

## 2021-03-22 DIAGNOSIS — K567 Ileus, unspecified: Secondary | ICD-10-CM | POA: Diagnosis not present

## 2021-03-22 DIAGNOSIS — R109 Unspecified abdominal pain: Secondary | ICD-10-CM

## 2021-03-22 DIAGNOSIS — N1330 Unspecified hydronephrosis: Secondary | ICD-10-CM | POA: Diagnosis not present

## 2021-03-22 DIAGNOSIS — K449 Diaphragmatic hernia without obstruction or gangrene: Secondary | ICD-10-CM | POA: Diagnosis not present

## 2021-03-22 DIAGNOSIS — E1169 Type 2 diabetes mellitus with other specified complication: Secondary | ICD-10-CM | POA: Diagnosis present

## 2021-03-22 DIAGNOSIS — R001 Bradycardia, unspecified: Secondary | ICD-10-CM | POA: Diagnosis not present

## 2021-03-22 LAB — RESP PANEL BY RT-PCR (FLU A&B, COVID) ARPGX2
Influenza A by PCR: NEGATIVE
Influenza B by PCR: NEGATIVE
SARS Coronavirus 2 by RT PCR: NEGATIVE

## 2021-03-22 LAB — CBC
HCT: 37.9 % — ABNORMAL LOW (ref 39.0–52.0)
Hemoglobin: 12.7 g/dL — ABNORMAL LOW (ref 13.0–17.0)
MCH: 33.8 pg (ref 26.0–34.0)
MCHC: 33.5 g/dL (ref 30.0–36.0)
MCV: 100.8 fL — ABNORMAL HIGH (ref 80.0–100.0)
Platelets: 183 10*3/uL (ref 150–400)
RBC: 3.76 MIL/uL — ABNORMAL LOW (ref 4.22–5.81)
RDW: 13.5 % (ref 11.5–15.5)
WBC: 10.2 10*3/uL (ref 4.0–10.5)
nRBC: 0 % (ref 0.0–0.2)

## 2021-03-22 LAB — BASIC METABOLIC PANEL
Anion gap: 9 (ref 5–15)
BUN: 25 mg/dL — ABNORMAL HIGH (ref 8–23)
CO2: 22 mmol/L (ref 22–32)
Calcium: 9.4 mg/dL (ref 8.9–10.3)
Chloride: 106 mmol/L (ref 98–111)
Creatinine, Ser: 1.79 mg/dL — ABNORMAL HIGH (ref 0.61–1.24)
GFR, Estimated: 35 mL/min — ABNORMAL LOW (ref >60)
Glucose, Bld: 205 mg/dL — ABNORMAL HIGH (ref 70–99)
Potassium: 4.7 mmol/L (ref 3.5–5.1)
Sodium: 137 mmol/L (ref 135–145)

## 2021-03-22 LAB — URINALYSIS, COMPLETE (UACMP) WITH MICROSCOPIC
Bilirubin Urine: NEGATIVE
Glucose, UA: NEGATIVE mg/dL
Ketones, ur: NEGATIVE mg/dL
Leukocytes,Ua: NEGATIVE
Nitrite: NEGATIVE
Protein, ur: 100 mg/dL — AB
RBC / HPF: >50 RBC/hpf — ABNORMAL HIGH (ref 0–5)
Specific Gravity, Urine: 1.016 (ref 1.005–1.030)
Squamous Epithelial / HPF: NONE SEEN (ref 0–5)
pH: 5 (ref 5.0–8.0)

## 2021-03-22 LAB — CBG MONITORING, ED: Glucose-Capillary: 196 mg/dL — ABNORMAL HIGH (ref 70–99)

## 2021-03-22 LAB — HEPATIC FUNCTION PANEL
ALT: 26 U/L (ref 0–44)
AST: 25 U/L (ref 15–41)
Albumin: 4.2 g/dL (ref 3.5–5.0)
Alkaline Phosphatase: 78 U/L (ref 38–126)
Bilirubin, Direct: 0.3 mg/dL — ABNORMAL HIGH (ref 0.0–0.2)
Indirect Bilirubin: 1 mg/dL — ABNORMAL HIGH (ref 0.3–0.9)
Total Bilirubin: 1.3 mg/dL — ABNORMAL HIGH (ref 0.3–1.2)
Total Protein: 7.1 g/dL (ref 6.5–8.1)

## 2021-03-22 LAB — LIPASE, BLOOD: Lipase: 29 U/L (ref 11–51)

## 2021-03-22 MED ORDER — INSULIN GLARGINE 100 UNIT/ML ~~LOC~~ SOLN
15.0000 [IU] | Freq: Every morning | SUBCUTANEOUS | Status: DC
  Filled 2021-03-22: qty 0.15

## 2021-03-22 MED ORDER — ACETAMINOPHEN 325 MG PO TABS
650.0000 mg | ORAL_TABLET | Freq: Four times a day (QID) | ORAL | Status: DC | PRN
  Administered 2021-03-24 – 2021-03-25 (×2): 650 mg via ORAL
  Filled 2021-03-22 (×2): qty 2

## 2021-03-22 MED ORDER — ONDANSETRON HCL 4 MG/2ML IJ SOLN
4.0000 mg | Freq: Once | INTRAMUSCULAR | Status: AC
  Administered 2021-03-22: 4 mg via INTRAVENOUS
  Filled 2021-03-22: qty 2

## 2021-03-22 MED ORDER — FENTANYL CITRATE (PF) 100 MCG/2ML IJ SOLN
25.0000 ug | Freq: Once | INTRAMUSCULAR | Status: AC
Start: 2021-03-22 — End: 2021-03-22
  Administered 2021-03-22: 25 ug via INTRAVENOUS
  Filled 2021-03-22: qty 2

## 2021-03-22 MED ORDER — ONDANSETRON HCL 4 MG PO TABS
4.0000 mg | ORAL_TABLET | Freq: Four times a day (QID) | ORAL | Status: DC | PRN
  Administered 2021-03-23: 4 mg via ORAL
  Filled 2021-03-22: qty 1

## 2021-03-22 MED ORDER — SENNA 8.6 MG PO TABS
1.0000 | ORAL_TABLET | Freq: Two times a day (BID) | ORAL | Status: DC
  Administered 2021-03-23 – 2021-03-27 (×5): 8.6 mg via ORAL
  Filled 2021-03-22 (×10): qty 1

## 2021-03-22 MED ORDER — LEVOTHYROXINE SODIUM 50 MCG PO TABS
50.0000 ug | ORAL_TABLET | Freq: Every day | ORAL | Status: DC
  Administered 2021-03-24 – 2021-03-28 (×5): 50 ug via ORAL
  Filled 2021-03-22 (×6): qty 1

## 2021-03-22 MED ORDER — NON FORMULARY: Freq: Every day | Status: DC

## 2021-03-22 MED ORDER — PANTOPRAZOLE SODIUM 40 MG PO TBEC
40.0000 mg | DELAYED_RELEASE_TABLET | Freq: Every day | ORAL | Status: DC
Start: 2021-03-23 — End: 2021-03-28
  Administered 2021-03-23 – 2021-03-28 (×5): 40 mg via ORAL
  Filled 2021-03-22 (×5): qty 1

## 2021-03-22 MED ORDER — OXYCODONE HCL 5 MG PO TABS: 5.0000 mg | ORAL_TABLET | Freq: Three times a day (TID) | ORAL | 0 refills | Status: AC | PRN

## 2021-03-22 MED ORDER — TAMSULOSIN HCL 0.4 MG PO CAPS: 0.4000 mg | ORAL_CAPSULE | Freq: Every day | ORAL | 0 refills | Status: AC

## 2021-03-22 MED ORDER — ONDANSETRON HCL 4 MG/2ML IJ SOLN
4.0000 mg | Freq: Four times a day (QID) | INTRAMUSCULAR | Status: DC | PRN
  Administered 2021-03-23 – 2021-03-25 (×3): 4 mg via INTRAVENOUS
  Filled 2021-03-22 (×3): qty 2

## 2021-03-22 MED ORDER — HYDROCODONE-ACETAMINOPHEN 5-325 MG PO TABS
1.0000 | ORAL_TABLET | ORAL | Status: DC | PRN
  Administered 2021-03-23: 1 via ORAL
  Administered 2021-03-24: 2 via ORAL
  Administered 2021-03-25: 1 via ORAL
  Administered 2021-03-25 – 2021-03-26 (×2): 2 via ORAL
  Administered 2021-03-26 – 2021-03-27 (×2): 1 via ORAL
  Filled 2021-03-22: qty 2
  Filled 2021-03-22 (×2): qty 1
  Filled 2021-03-22 (×2): qty 2
  Filled 2021-03-22 (×3): qty 1

## 2021-03-22 MED ORDER — HEPARIN SODIUM (PORCINE) 5000 UNIT/ML IJ SOLN
5000.0000 [IU] | Freq: Three times a day (TID) | INTRAMUSCULAR | Status: DC
  Administered 2021-03-23 – 2021-03-27 (×14): 5000 [IU] via SUBCUTANEOUS
  Filled 2021-03-22 (×14): qty 1

## 2021-03-22 MED ORDER — ROSUVASTATIN CALCIUM 10 MG PO TABS
5.0000 mg | ORAL_TABLET | ORAL | Status: DC
  Administered 2021-03-23 – 2021-03-27 (×3): 5 mg via ORAL
  Filled 2021-03-22 (×4): qty 1

## 2021-03-22 MED ORDER — ONDANSETRON 4 MG PO TBDP: 4.0000 mg | ORAL_TABLET | Freq: Three times a day (TID) | ORAL | 0 refills | Status: DC | PRN

## 2021-03-22 MED ORDER — ACETAMINOPHEN 500 MG PO TABS
1000.0000 mg | ORAL_TABLET | Freq: Once | ORAL | Status: AC
  Administered 2021-03-22: 1000 mg via ORAL
  Filled 2021-03-22: qty 2

## 2021-03-22 MED ORDER — SODIUM CHLORIDE 0.9% FLUSH
3.0000 mL | Freq: Two times a day (BID) | INTRAVENOUS | Status: DC
  Administered 2021-03-23 – 2021-03-28 (×10): 3 mL via INTRAVENOUS

## 2021-03-22 MED ORDER — ALBUTEROL SULFATE (2.5 MG/3ML) 0.083% IN NEBU: 2.5000 mg | INHALATION_SOLUTION | RESPIRATORY_TRACT | Status: DC | PRN

## 2021-03-22 MED ORDER — ADULT MULTIVITAMIN W/MINERALS CH
1.0000 | ORAL_TABLET | Freq: Every day | ORAL | Status: DC
  Administered 2021-03-23 – 2021-03-28 (×5): 1 via ORAL
  Filled 2021-03-22 (×5): qty 1

## 2021-03-22 MED ORDER — OXYCODONE HCL 5 MG PO TABS
5.0000 mg | ORAL_TABLET | Freq: Once | ORAL | Status: AC
Start: 2021-03-22 — End: 2021-03-22
  Administered 2021-03-22: 5 mg via ORAL
  Filled 2021-03-22: qty 1

## 2021-03-22 MED ORDER — SODIUM CHLORIDE 0.9 % IV SOLN: INTRAVENOUS | Status: DC

## 2021-03-22 MED ORDER — ASPIRIN EC 81 MG PO TBEC
81.0000 mg | DELAYED_RELEASE_TABLET | Freq: Every day | ORAL | Status: DC
  Administered 2021-03-23 – 2021-03-27 (×4): 81 mg via ORAL
  Filled 2021-03-22 (×4): qty 1

## 2021-03-22 MED ORDER — SODIUM CHLORIDE 0.9 % IV BOLUS
500.0000 mL | Freq: Once | INTRAVENOUS | Status: AC
  Administered 2021-03-22: 500 mL via INTRAVENOUS

## 2021-03-22 MED ORDER — MORPHINE SULFATE (PF) 2 MG/ML IV SOLN
2.0000 mg | INTRAVENOUS | Status: DC | PRN
  Administered 2021-03-23 – 2021-03-27 (×6): 2 mg via INTRAVENOUS
  Filled 2021-03-22 (×6): qty 1

## 2021-03-22 MED ORDER — LINAGLIPTIN 5 MG PO TABS
5.0000 mg | ORAL_TABLET | Freq: Every day | ORAL | Status: DC
  Administered 2021-03-23 – 2021-03-28 (×5): 5 mg via ORAL
  Filled 2021-03-22 (×6): qty 1

## 2021-03-22 NOTE — ED Notes (Signed)
Patient presents with lower back pain 8/10. Patient has hx kidney stones "about 7-8 years ago". Patient states the pain he is feeling is similar. Able to urinate, sample collected and sent to lab.  Patient is ambulatory, A&Ox4.

## 2021-03-22 NOTE — ED Triage Notes (Signed)
Pt to ED POV for bilateral flank pain that started worsening last night.  Pt states sees kidney doctor for kidney disease.  Denies pain with urination, fevers. States does not make a lot of urine   Taken tylenol PTA  Hx HTN, has not taken meds today

## 2021-03-22 NOTE — H&P (Addendum)
History and Physical    David Henry [id number] DOB: 1926-08-12 DOA: 03/22/2021  PCP: Abner Greenspan, MD  Patient coming from: home  I have personally briefly reviewed patient's old medical records in [place]  Chief Complaint: severe b/l  flank pain  HPI: David Henry is a 87 y.o. male with medical history significant of  for, benign essential hypertension, CAD s/p pci 2006,Moderate mitral insufficiency/dilated aortic root, HLD,CKDIIIb, b/l carotid artery stenosis, diabetes mellitus type 2, hypothyroidism,PVD, BPH, remote history of renal calculi who presents, to ed with complaint of severe b/l flank pain with nausea and vomiting that started am of presentation. Patient also note that he feels like he is retaining his urine, and dose have burning on urination. He notes no fever or chills,sob,chest pain, abdominal pain, constipation or diarrhea.   ED Course:  Afeb, bp 182/82, hr 108, hr 61, rr 19  Sat 100% on ra  Labs:  UA: +large blood,  Wbc 21-50,  + bacteria  CT abd/pelvis: 1 Mild to moderate left hydronephrosis and hydroureter secondary to a 3 mm stone in the distal left ureter about a cm proximal to the left UVJ. 2. Extensive sigmoid colon diverticular disease without acute inflammatory change. 3. Multiple bilateral renal cysts including parapelvic cysts.  tx : zofran, oxycodone  Plan initially was for discharge home however patient continued to have refractory pain and n/v.  Case discussed with Dr Bernardo Heater who recommended starting start silodosin  Will in house.  Review of Systems: As per HPI otherwise 10 point review of systems negative.   Past Medical History:  Diagnosis Date  . Arthritis   . CAD (coronary artery disease)    3 stents  . Colon polyp   . Diabetes mellitus    type II  . Dyspnea    with exertion  . GERD (gastroesophageal reflux disease)   . Hyperlipidemia   . Hypertension   . Hypothyroidism   . Kidney stones    stage III CKD  .  Neuropathy   . Peripheral vascular disease (HCC)    neuropathy in feet    Past Surgical History:  Procedure Laterality Date  . APPENDECTOMY  1984  . CATARACT EXTRACTION Bilateral 04/2001  . CHOLECYSTECTOMY  1989  . COLONOSCOPY N/A 2/14   hemorrhoids and tics (after pos ifob card)  . CORONARY ANGIOPLASTY  2006   3 stents  . ESOPHAGOGASTRODUODENOSCOPY N/A 2/14  . RETINAL DETACHMENT SURGERY Right 2003  . SHOULDER ARTHROSCOPY WITH ROTATOR CUFF REPAIR AND SUBACROMIAL DECOMPRESSION Right 12/30/2017   Procedure: SHOULDER ARTHROSCOPY WITH ROTATOR CUFF REPAIR AND SUBACROMIAL DECOMPRESSION;  Surgeon: Leanor Kail, MD;  Location: [facility] ORS;  Service: Orthopedics;  Laterality: Right;  . SHOULDER SURGERY  2012   Lt shoulder repair     reports that he has never smoked. He has never used smokeless tobacco. He reports that he does not drink alcohol and does not use drugs.  Allergies  Allergen Reactions  . Adhesive [Tape] Dermatitis    Skin is thin so it is tough to take off.  Use paper tape  . Azithromycin Nausea Only    REACTION: nausea  . Celecoxib Other (See Comments)    Raises blood pressure  . Zocor [Simvastatin] Other (See Comments)    Muscle pain    Family History  Problem Relation Age of Onset  . Cancer Brother        prostate CA  . Heart disease Mother   . Stroke Father   .  Kidney disease Sister   . Kidney disease Sister   . Clotting disorder Sister   . COPD Brother   . Heart disease Brother     Prior to Admission medications   Medication Sig Start Date End Date Taking? Authorizing Provider  ondansetron (ZOFRAN ODT) 4 MG disintegrating tablet Take 1 tablet (4 mg total) by mouth every 8 (eight) hours as needed for nausea or vomiting. 03/22/21  Yes Vanessa Parc, MD  oxyCODONE (ROXICODONE) 5 MG immediate release tablet Take 1 tablet (5 mg total) by mouth every 8 (eight) hours as needed. 03/22/21 03/22/22 Yes Vanessa Decatur, MD  tamsulosin (FLOMAX) 0.4 MG CAPS capsule Take 1  capsule (0.4 mg total) by mouth daily for 14 days. 03/22/21 04/05/21 Yes Vanessa Quitaque, MD  acetaminophen (TYLENOL) 650 MG CR tablet Take 650 mg by mouth every 8 (eight) hours as needed.    [provider]  aspirin 81 MG tablet Take 81 mg by mouth daily.    [provider]  glimepiride (AMARYL) 4 MG tablet Take 1 tablet (4 mg total) by mouth 2 (two) times daily. 10/08/20   Tower, Wynelle Fanny, MD  insulin glargine (LANTUS SOLOSTAR) 100 UNIT/ML Solostar Pen INJECT 15 UNITS INTO THE SKIN EVERY MORNING 01/30/21   Tower, Wynelle Fanny, MD  levothyroxine (SYNTHROID) 50 MCG tablet Take 1 tablet (50 mcg total) by mouth daily before breakfast. 01/14/21   Tower, Wynelle Fanny, MD  lisinopril (ZESTRIL) 20 MG tablet Take 1 tablet (20 mg total) by mouth daily. 10/08/20   Tower, Wynelle Fanny, MD  Multiple Vitamin (MULTIVITAMIN) capsule Take 1 capsule by mouth daily.    [provider]  NON FORMULARY 100 BC ultra fine needles    [provider]  ONETOUCH ULTRA test strip CHECK BLOOD SUGAR TWICE DAILY AND AS DIRECTED FROM DM 01/10/21   Tower, Wynelle Fanny, MD  pantoprazole (PROTONIX) 40 MG tablet Take 1 tablet (40 mg total) by mouth daily. 10/08/20   Tower, Wynelle Fanny, MD  rosuvastatin (CRESTOR) 5 MG tablet Take 1 tablet (5 mg total) by mouth every other day. 10/08/20   Tower, Wynelle Fanny, MD  senna (SENOKOT) 8.6 MG tablet Take 1 tablet by mouth 2 (two) times daily.    [provider]  sitaGLIPtin (JANUVIA) 50 MG tablet Take 1 tablet (50 mg total) by mouth daily. 10/08/20   Tower, Wynelle Fanny, MD  ULTICARE MICRO PEN NEEDLES 32G X 4 MM MISC USE AS DIRECTED WITH LANTUS 09/10/20   Tower, Wynelle Fanny, MD  omeprazole (PRILOSEC OTC) 20 MG tablet Take 1 tablet (20 mg total) by mouth daily. 12/06/13 06/14/14  Abner Greenspan, MD    Physical Exam: Vitals:   03/22/21 1900 03/22/21 2041 03/22/21 2100 03/22/21 2130  BP: (!) 146/65 (!) 166/54 (!) 144/49 (!) 156/51  Pulse: (!) 52 62 (!) 59 (!) 59  Resp: 17 18    Temp:       TempSrc:      SpO2: 96% 99% 98% 98%  Weight:      Height:         Vitals:   03/22/21 1900 03/22/21 2041 03/22/21 2100 03/22/21 2130  BP: (!) 146/65 (!) 166/54 (!) 144/49 (!) 156/51  Pulse: (!) 52 62 (!) 59 (!) 59  Resp: 17 18    Temp:      TempSrc:      SpO2: 96% 99% 98% 98%  Weight:      Height:  Constitutional: NAD, calm, comfortable Eyes: PERRL, lids and conjunctivae normal ENMT: Mucous membranes are moist. Posterior pharynx clear of any exudate or lesions.Normal dentition.  Neck: normal, supple, no masses, no thyromegaly Respiratory: clear to auscultation bilaterally, no wheezing, no crackles. Normal respiratory effort. No accessory muscle use.  Cardiovascular: Regular rate and rhythm, no murmurs / rubs / gallops. 2+ pedal pulses.  Abdomen: no tenderness, no masses palpated. No hepatosplenomegaly. Bowel sounds positive.  Musculoskeletal: no clubbing / cyanosis. No joint deformity upper and lower extremities. Good ROM, no contractures. Normal muscle tone.  Skin: no rashes, lesions, ulcers. No induration Neurologic: CN 2-12 grossly intact. Sensation intact, DTR normal. Strength 5/5 in all 4.  Psychiatric: Normal judgment and insight. Alert and oriented x 3. Normal mood.    Labs on Admission: I have personally reviewed following labs and imaging studies  CBC: Recent Labs  Lab 03/22/21 1414  WBC 10.2  HGB 12.7*  HCT 37.9*  MCV 100.8*  PLT 700   Basic Metabolic Panel: Recent Labs  Lab 03/22/21 1414  NA 137  K 4.7  CL 106  CO2 22  GLUCOSE 205*  BUN 25*  CREATININE 1.79*  CALCIUM 9.4   GFR: Estimated Creatinine Clearance: 25.5 mL/min (A) (by C-G formula based on SCr of 1.79 mg/dL (H)). Liver Function Tests: Recent Labs  Lab 03/22/21 1414  AST 25  ALT 26  ALKPHOS 78  BILITOT 1.3*  PROT 7.1  ALBUMIN 4.2   Recent Labs  Lab 03/22/21 1414  LIPASE 29   No results for input(s): AMMONIA in the last 168 hours. Coagulation Profile: No results for  input(s): INR, PROTIME in the last 168 hours. Cardiac Enzymes: No results for input(s): CKTOTAL, CKMB, CKMBINDEX, TROPONINI in the last 168 hours. BNP (last 3 results) No results for input(s): PROBNP in the last 8760 hours. HbA1C: No results for input(s): HGBA1C in the last 72 hours. CBG: No results for input(s): GLUCAP in the last 168 hours. Lipid Profile: No results for input(s): CHOL, HDL, LDLCALC, TRIG, CHOLHDL, LDLDIRECT in the last 72 hours. Thyroid Function Tests: No results for input(s): TSH, T4TOTAL, FREET4, T3FREE, THYROIDAB in the last 72 hours. Anemia Panel: No results for input(s): VITAMINB12, FOLATE, FERRITIN, TIBC, IRON, RETICCTPCT in the last 72 hours. Urine analysis:    Component Value Date/Time   COLORURINE YELLOW (A) 03/22/2021 1523   APPEARANCEUR HAZY (A) 03/22/2021 1523   LABSPEC 1.016 03/22/2021 1523   PHURINE 5.0 03/22/2021 1523   GLUCOSEU NEGATIVE 03/22/2021 1523   GLUCOSEU NEGATIVE 06/23/2013 0822   HGBUR LARGE (A) 03/22/2021 1523   BILIRUBINUR NEGATIVE 03/22/2021 1523   BILIRUBINUR Small 05/24/2013 0952   KETONESUR NEGATIVE 03/22/2021 1523   PROTEINUR 100 (A) 03/22/2021 1523   UROBILINOGEN 0.2 06/23/2013 0822   NITRITE NEGATIVE 03/22/2021 1523   LEUKOCYTESUR NEGATIVE 03/22/2021 1523    Radiological Exams on Admission: CT Renal Stone Study  Result Date: 03/22/2021 CLINICAL DATA:  Hematuria flank pain EXAM: CT ABDOMEN AND PELVIS WITHOUT CONTRAST TECHNIQUE: Multidetector CT imaging of the abdomen and pelvis was performed following the standard protocol without IV contrast. COMPARISON:  CT 06/10/2013 FINDINGS: Lower chest: Lung bases demonstrate no acute consolidation or effusion. Mild subpleural fibrosis on the right. Borderline cardiac size. Small pericardial effusion. Small hiatal hernia. Hepatobiliary: No focal liver abnormality is seen. Status post cholecystectomy. No biliary dilatation. Pancreas: Unremarkable. No pancreatic ductal dilatation or  surrounding inflammatory changes. Fatty atrophy Spleen: Normal in size without focal abnormality. Adrenals/Urinary Tract: Adrenal glands are normal. 7.6  cm cyst exophytic to the upper pole left kidney. Subcentimeter cortical hyperdensity mid right kidney without change and likely representing hemorrhagic or proteinaceous cyst. Bilateral parapelvic cysts. Mild to moderate hydronephrosis on the left with hydroureter. This is secondary to a 3 mm stone in the distal left ureter about a cm proximal to the left UVJ. Bladder is nearly empty Stomach/Bowel: Stomach is within normal limits. Status post appendectomy. Extensive sigmoid colon diverticular disease without acute inflammatory change. No evidence of bowel wall thickening, distention, or inflammatory changes. Vascular/Lymphatic: Nonaneurysmal aorta. Moderate aortic atherosclerosis. No suspicious nodes. Reproductive: Enlarged prostate gland Other: Negative for free air or free fluid. Small fat containing left inguinal hernia. Musculoskeletal: Degenerative changes of the spine. No acute osseous abnormality IMPRESSION: 1. Mild to moderate left hydronephrosis and hydroureter secondary to a 3 mm stone in the distal left ureter about a cm proximal to the left UVJ. 2. Extensive sigmoid colon diverticular disease without acute inflammatory change. 3. Multiple bilateral renal cysts including parapelvic cysts. Aortic Atherosclerosis (ICD10-I70.0). Electronically Signed   By: Donavan Foil M.D.   On: 03/22/2021 16:45    EKG: Independently reviewed. Sinus ,1st degree block , RBBB no sig change from prior  Assessment/Plan  Renal Calculi with associated Mild to moderated  Left hydronephrosis and hydroureter -supportive care , ivfs, pain medications, antiemetics  -start rapaflo  -once pain and n/v under control can be discharged to follow up out patient with pcp   Urine abn -sent for culture -if patient spikes fever or wbc trends up would initiate  antibiotics  HTN -elevated on admission due to pain   CAD s/p pci 2006 -resume home regimen asa,crestor -resume zestril in am if renal function remains at baseline     Moderate mitral insufficiency/dilated aortic root -no acute issues followed by [place] -continue crestor  CKDIIIb -baseline around 1.7  -current at baseline    b/l carotid artery stenosis -50% stenosis  -followed [facility]    Diabetes mellitus type 2 -resume home regimen  -iss/fs  -last a1c  6.8 1/22 -hx of hypoglycemia, monitor closely while in house   hypothyroidism -continue synthroid   PVD -no active issues    BPH -on tamsulosin -will switch to silodosin while treating renal calculi     DVT prophylaxis: heparin Code Status:full Family Communication:  Disposition Plan: patient  expected to be admitted less than 2 midnights Consults called: n/a Admission status: inpatient   Clance Boll MD [facility]  If 7PM-7AM, please contact night-coverage [email or web address] Password [email or web address]  03/22/2021, 10:20 PM

## 2021-03-22 NOTE — ED Notes (Signed)
Patient c/o increasing pain 6/10, nausea, and shakiness. Daughter expressing concerns about patient being in too much pain if he goes home.  EDP informed.

## 2021-03-22 NOTE — ED Notes (Signed)
Patient able to ambulate in room. Able to stand independently, states "I feel so good I can do a jig" while dancing in the room. Patient states "nausea is gone and is feeling better".

## 2021-03-22 NOTE — Discharge Instructions (Addendum)
I talked to urology and they should call you to make an appointment for early next week however if he develops fevers or worsening pain return to the ER immediately   You have a kidney stone. See report below.   Take tylenol 1g every 8 hours daily. Take oxycodone for breakthrough pain. Do not drive, work, or operate machinery while on this.  Take zofran to help with nausea. Take Flomax to help dilate uretha. Call urology number above to schedule outpatient appointment. Return to ED for fevers, unable to keep food down, or any other concerns.     IMPRESSION: 1. Mild to moderate left hydronephrosis and hydroureter secondary to a 3 mm stone in the distal left ureter about a cm proximal to the left UVJ. 2. Extensive sigmoid colon diverticular disease without acute inflammatory change. 3. Multiple bilateral renal cysts including parapelvic cysts.

## 2021-03-22 NOTE — ED Notes (Signed)
Upon entering room patient vomiting. EDP informed, new orders placed.

## 2021-03-22 NOTE — ED Provider Notes (Addendum)
[facility] Emergency Department Provider Note  ____________________________________________   Event Date/Time   First MD Initiated Contact with Patient 03/22/21 1509     (approximate)  I have reviewed the triage vital signs and the nursing notes.   HISTORY  Chief Complaint Flank Pain    HPI David Henry is a 87 y.o. male with coronary disease, diabetes, hypothyroidism, CKD who comes in with flank pain.  Patient states this morning he started to develop bilateral lower flank pain that is been constant, moderate, nothing makes it better, nothing makes it worse.  He states that he feels like he is not getting all of his urine out.  Denies any hematuria.  Denies any dysuria.  Denies any chest pain, shortness of breath, recent falls.  States that he had a kidney stone but it was 10 years ago.          Past Medical History:  Diagnosis Date  . Arthritis   . CAD (coronary artery disease)    3 stents  . Colon polyp   . Diabetes mellitus    type II  . Dyspnea    with exertion  . GERD (gastroesophageal reflux disease)   . Hyperlipidemia   . Hypertension   . Hypothyroidism   . Kidney stones    stage III CKD  . Neuropathy   . Peripheral vascular disease ([place])    neuropathy in feet    Patient Active Problem List   Diagnosis Date Noted  . Macrocytosis 10/05/2019  . Routine general medical examination at a health care facility 03/09/2016  . Constipation 09/17/2015  . Encounter for Medicare annual wellness exam 12/08/2014  . Hematuria, microscopic 05/26/2013  . Chronic kidney disease (CKD) 05/24/2013  . Anemia 11/23/2012  . Hypothyroid 11/23/2012  . GERD 11/29/2010  . Coronary atherosclerosis 04/18/2007  . Controlled type 2 diabetes mellitus with chronic kidney disease ([place]) 04/15/2007  . Hyperlipidemia associated with type 2 diabetes mellitus ([place]) 04/15/2007  . Essential hypertension 04/15/2007  . MYOCARDIAL INFARCTION, HX OF 04/15/2007   . ACTINIC KERATOSIS 04/15/2007    Past Surgical History:  Procedure Laterality Date  . APPENDECTOMY  1984  . CATARACT EXTRACTION Bilateral 04/2001  . CHOLECYSTECTOMY  1989  . COLONOSCOPY N/A 2/14   hemorrhoids and tics (after pos ifob card)  . CORONARY ANGIOPLASTY  2006   3 stents  . ESOPHAGOGASTRODUODENOSCOPY N/A 2/14  . RETINAL DETACHMENT SURGERY Right 2003  . SHOULDER ARTHROSCOPY WITH ROTATOR CUFF REPAIR AND SUBACROMIAL DECOMPRESSION Right 12/30/2017   Procedure: SHOULDER ARTHROSCOPY WITH ROTATOR CUFF REPAIR AND SUBACROMIAL DECOMPRESSION;  Surgeon: Leanor Kail, MD;  Location: [facility] ORS;  Service: Orthopedics;  Laterality: Right;  . SHOULDER SURGERY  2012   Lt shoulder repair    Prior to Admission medications   Medication Sig Start Date End Date Taking? Authorizing Provider  acetaminophen (TYLENOL) 650 MG CR tablet Take 650 mg by mouth every 8 (eight) hours as needed.    [provider]  aspirin 81 MG tablet Take 81 mg by mouth daily.    [provider]  glimepiride (AMARYL) 4 MG tablet Take 1 tablet (4 mg total) by mouth 2 (two) times daily. 10/08/20   Tower, Wynelle Fanny, MD  insulin glargine (LANTUS SOLOSTAR) 100 UNIT/ML Solostar Pen INJECT 15 UNITS INTO THE SKIN EVERY MORNING 01/30/21   Tower, Wynelle Fanny, MD  levothyroxine (SYNTHROID) 50 MCG tablet Take 1 tablet (50 mcg total) by mouth daily before breakfast. 01/14/21   Tower, [place]  A, MD  lisinopril (ZESTRIL) 20 MG tablet Take 1 tablet (20 mg total) by mouth daily. 10/08/20   Tower, Wynelle Fanny, MD  Multiple Vitamin (MULTIVITAMIN) capsule Take 1 capsule by mouth daily.    [provider]  NON FORMULARY 100 BC ultra fine needles    [provider]  ONETOUCH ULTRA test strip CHECK BLOOD SUGAR TWICE DAILY AND AS DIRECTED FROM DM 01/10/21   Tower, Wynelle Fanny, MD  pantoprazole (PROTONIX) 40 MG tablet Take 1 tablet (40 mg total) by mouth daily. 10/08/20   Tower, Wynelle Fanny, MD  rosuvastatin (CRESTOR) 5 MG tablet  Take 1 tablet (5 mg total) by mouth every other day. 10/08/20   Tower, Wynelle Fanny, MD  senna (SENOKOT) 8.6 MG tablet Take 1 tablet by mouth 2 (two) times daily.    [provider]  sitaGLIPtin (JANUVIA) 50 MG tablet Take 1 tablet (50 mg total) by mouth daily. 10/08/20   Tower, Wynelle Fanny, MD  tamsulosin (FLOMAX) 0.4 MG CAPS capsule TAKE 1 CAPSULE BY MOUTH ONCE DAILY 10/12/20   Tower, Wynelle Fanny, MD  ULTICARE MICRO PEN NEEDLES 32G X 4 MM MISC USE AS DIRECTED WITH LANTUS 09/10/20   Tower, Wynelle Fanny, MD  omeprazole (PRILOSEC OTC) 20 MG tablet Take 1 tablet (20 mg total) by mouth daily. 12/06/13 06/14/14  Tower, Wynelle Fanny, MD    Allergies Adhesive [tape], Azithromycin, Celecoxib, and Zocor [simvastatin]  Family History  Problem Relation Age of Onset  . Cancer Brother        prostate CA  . Heart disease Mother   . Stroke Father   . Kidney disease Sister   . Kidney disease Sister   . Clotting disorder Sister   . COPD Brother   . Heart disease Brother     Social History Social History   Tobacco Use  . Smoking status: Never Smoker  . Smokeless tobacco: Never Used  Vaping Use  . Vaping Use: Never used  Substance Use Topics  . Alcohol use: No    Alcohol/week: 0.0 standard drinks  . Drug use: No      Review of Systems Constitutional: No fever/chills Eyes: No visual changes. ENT: No sore throat. Cardiovascular: Denies chest pain. Respiratory: Denies shortness of breath. Gastrointestinal: No abdominal pain.  No nausea, no vomiting.  No diarrhea.  No constipation. Genitourinary: Negative for dysuria. Musculoskeletal: Positive lower back pain. Skin: Negative for rash. Neurological: Negative for headaches, focal weakness or numbness. All other ROS negative ____________________________________________   PHYSICAL EXAM:  VITAL SIGNS: ED Triage Vitals  Enc Vitals Group     BP 03/22/21 1412 (!) 189/73     Pulse Rate 03/22/21 1410 68     Resp 03/22/21 1410 18     Temp 03/22/21 1410  97.6 F (36.4 C)     Temp Source 03/22/21 1410 Oral     SpO2 03/22/21 1410 98 %     Weight 03/22/21 1411 155 lb (70.3 kg)     Height 03/22/21 1411 5\' 8"  (1.727 m)     Head Circumference --      Peak Flow --      Pain Score 03/22/21 1411 6     Pain Loc --      Pain Edu? --      Excl. in [place]? --     Constitutional: Alert and oriented. Well appearing and in no acute distress. Eyes: Conjunctivae are normal. EOMI. Head: Atraumatic. Nose: No congestion/rhinnorhea. Mouth/Throat: Mucous membranes are moist.  Neck: No stridor. Trachea Midline. FROM Cardiovascular: Normal rate, regular rhythm. Grossly normal heart sounds.  Good peripheral circulation. Respiratory: Normal respiratory effort.  No retractions. Lungs CTAB. Gastrointestinal: Soft and nontender. No distention. No abdominal bruits.  Musculoskeletal: No lower extremity tenderness nor edema.  No joint effusions.  Reports some lower back pain. Neurologic:  Normal speech and language. No gross focal neurologic deficits are appreciated.  Skin:  Skin is warm, dry and intact. No rash noted. Psychiatric: Mood and affect are normal. Speech and behavior are normal. GU: Deferred   ____________________________________________   LABS (all labs ordered are listed, but only abnormal results are displayed)  Labs Reviewed  URINALYSIS, COMPLETE (UACMP) WITH MICROSCOPIC - Abnormal; Notable for the following components:      Result Value   Color, Urine YELLOW (*)    APPearance HAZY (*)    Hgb urine dipstick LARGE (*)    Protein, ur 100 (*)    RBC / HPF >50 (*)    Bacteria, UA RARE (*)    All other components within normal limits  BASIC METABOLIC PANEL - Abnormal; Notable for the following components:   Glucose, Bld 205 (*)    BUN 25 (*)    Creatinine, Ser 1.79 (*)    GFR, Estimated 35 (*)    All other components within normal limits  CBC - Abnormal; Notable for the following components:   RBC 3.76 (*)    Hemoglobin 12.7 (*)    HCT  37.9 (*)    MCV 100.8 (*)    All other components within normal limits  HEPATIC FUNCTION PANEL  LIPASE, BLOOD   ____________________________________________   ED ECG REPORT I, Vanessa [place], the attending physician, personally viewed and interpreted this ECG.  EKG is sinus bradycardia rate of 55, r bundle branch block and left anterior fascicular block, no ST elevation, T wave inversion in lead III.  First-degree AV block ____________________________________________  RADIOLOGY   Official radiology report(s): CT Renal Stone Study  Result Date: 03/22/2021 CLINICAL DATA:  Hematuria flank pain EXAM: CT ABDOMEN AND PELVIS WITHOUT CONTRAST TECHNIQUE: Multidetector CT imaging of the abdomen and pelvis was performed following the standard protocol without IV contrast. COMPARISON:  CT 06/10/2013 FINDINGS: Lower chest: Lung bases demonstrate no acute consolidation or effusion. Mild subpleural fibrosis on the right. Borderline cardiac size. Small pericardial effusion. Small hiatal hernia. Hepatobiliary: No focal liver abnormality is seen. Status post cholecystectomy. No biliary dilatation. Pancreas: Unremarkable. No pancreatic ductal dilatation or surrounding inflammatory changes. Fatty atrophy Spleen: Normal in size without focal abnormality. Adrenals/Urinary Tract: Adrenal glands are normal. 7.6 cm cyst exophytic to the upper pole left kidney. Subcentimeter cortical hyperdensity mid right kidney without change and likely representing hemorrhagic or proteinaceous cyst. Bilateral parapelvic cysts. Mild to moderate hydronephrosis on the left with hydroureter. This is secondary to a 3 mm stone in the distal left ureter about a cm proximal to the left UVJ. Bladder is nearly empty Stomach/Bowel: Stomach is within normal limits. Status post appendectomy. Extensive sigmoid colon diverticular disease without acute inflammatory change. No evidence of bowel wall thickening, distention, or inflammatory changes.  Vascular/Lymphatic: Nonaneurysmal aorta. Moderate aortic atherosclerosis. No suspicious nodes. Reproductive: Enlarged prostate gland Other: Negative for free air or free fluid. Small fat containing left inguinal hernia. Musculoskeletal: Degenerative changes of the spine. No acute osseous abnormality IMPRESSION: 1. Mild to moderate left hydronephrosis and hydroureter secondary to a 3 mm stone in the distal left ureter about a cm proximal to the left  UVJ. 2. Extensive sigmoid colon diverticular disease without acute inflammatory change. 3. Multiple bilateral renal cysts including parapelvic cysts. Aortic Atherosclerosis (ICD10-I70.0). Electronically Signed   By: Donavan Foil M.D.   On: 03/22/2021 16:45    ____________________________________________   PROCEDURES  Procedure(s) performed (including Critical Care):  Procedures   ____________________________________________   INITIAL IMPRESSION / ASSESSMENT AND PLAN / ED COURSE  ELIGA ARVIE was evaluated in Emergency Department on 03/22/2021 for the symptoms described in the history of present illness. He was evaluated in the context of the global COVID-19 pandemic, which necessitated consideration that the patient might be at risk for infection with the SARS-CoV-2 virus that causes COVID-19. Institutional protocols and algorithms that pertain to the evaluation of patients at risk for COVID-19 are in a state of rapid change based on information released by regulatory bodies including the CDC and federal and state organizations. These policies and algorithms were followed during the patient's care in the ED.    Patient is a well-appearing 87 year old who is hypertensive who comes in with bilateral flank pain.  Labs were ordered to evaluate for Electra abnormalities, AKI, UTI.  Labs show a kidney function of 1.79 and GFR of 35.  Patient does have elevated sugars but no evidence of DKA.  His urine does not show evidence of UTI but he does have greater  than 50 RBCs and large hemoglobin therefore I suspect that there is a good chance that this could be a kidney stone.  Discussed with family and we will start off with a CT renal due to his kidney function however if the CT renal is negative then I think we should proceed with a CT with contrast to rule out aneurysm or other acute cause of pain.  CT scan is consistent with kidney stone  Patient is afebrile with some mild WBCs 21-50 but no leuks or nitrites to suggest active infection.  Patient's pain was still significant after the IV fentanyl and IV Zofran and he did have an episode of vomiting.  However after the medication he no longer feels nauseous but still having a lot of pain.  We will trial some oxycodone and Tylenol to see if patient is safe to go home otherwise patient may need admission for pain control.  Patient was given oral medication pain is well controlled.  Patient still able to ambulate without any dizziness or weakness.  Patient is tolerating p.o. without any additional vomiting.  I discussed with family by her different options and they feel comfortable with going home.  Discussed the case with Dr. Bernardo Heater to get close follow-up early next week.  We discussed return precautions in regards to fevers or any other concern   Today I did explain to patient that if they develop any fever that he should return the ER.  We also discussed being careful on the oxycodone with walking.  The daughter is going to be with him the entire weekend.  10:14 PM upon discharge patient started vomiting again.  Family very concerned about patient being discharged home we will admit to the hospital due to unable to tolerate p.o.         ____________________________________________   FINAL CLINICAL IMPRESSION(S) / ED DIAGNOSES   Final diagnoses:  Kidney stone      MEDICATIONS GIVEN DURING THIS VISIT:  Medications  ondansetron (ZOFRAN) injection 4 mg (4 mg Intravenous Given 03/22/21 1613)   fentaNYL (SUBLIMAZE) injection 25 mcg (25 mcg Intravenous Given 03/22/21 1614)  sodium chloride  0.9 % bolus 500 mL (0 mLs Intravenous Stopped 03/22/21 1707)  oxyCODONE (Oxy IR/ROXICODONE) immediate release tablet 5 mg (5 mg Oral Given 03/22/21 1700)  acetaminophen (TYLENOL) tablet 1,000 mg (1,000 mg Oral Given 03/22/21 1700)     ED Discharge Orders         Ordered    oxyCODONE (ROXICODONE) 5 MG immediate release tablet  Every 8 hours PRN        03/22/21 2126    ondansetron (ZOFRAN ODT) 4 MG disintegrating tablet  Every 8 hours PRN        03/22/21 2126    tamsulosin (FLOMAX) 0.4 MG CAPS capsule  Daily        03/22/21 2126           Note:  This document was prepared using Dragon voice recognition software and may include unintentional dictation errors.   Vanessa [place], MD 03/22/21 2131    Vanessa Corrigan, MD 03/22/21 2215

## 2021-03-22 NOTE — ED Notes (Signed)
Bladder scanned per order. Greatest amount: 12 mL, patient states he had gone to the bathroom again before the bladder scan.  Patient able to tolerate PO nutrition and fluids.

## 2021-03-23 DIAGNOSIS — I2510 Atherosclerotic heart disease of native coronary artery without angina pectoris: Secondary | ICD-10-CM | POA: Diagnosis present

## 2021-03-23 DIAGNOSIS — I6523 Occlusion and stenosis of bilateral carotid arteries: Secondary | ICD-10-CM | POA: Diagnosis present

## 2021-03-23 DIAGNOSIS — E785 Hyperlipidemia, unspecified: Secondary | ICD-10-CM

## 2021-03-23 DIAGNOSIS — N179 Acute kidney failure, unspecified: Secondary | ICD-10-CM

## 2021-03-23 DIAGNOSIS — R3915 Urgency of urination: Secondary | ICD-10-CM | POA: Diagnosis present

## 2021-03-23 DIAGNOSIS — E039 Hypothyroidism, unspecified: Secondary | ICD-10-CM | POA: Diagnosis present

## 2021-03-23 DIAGNOSIS — Z7984 Long term (current) use of oral hypoglycemic drugs: Secondary | ICD-10-CM | POA: Diagnosis not present

## 2021-03-23 DIAGNOSIS — K567 Ileus, unspecified: Secondary | ICD-10-CM | POA: Diagnosis not present

## 2021-03-23 DIAGNOSIS — Z955 Presence of coronary angioplasty implant and graft: Secondary | ICD-10-CM | POA: Diagnosis not present

## 2021-03-23 DIAGNOSIS — N200 Calculus of kidney: Secondary | ICD-10-CM | POA: Diagnosis present

## 2021-03-23 DIAGNOSIS — Z794 Long term (current) use of insulin: Secondary | ICD-10-CM | POA: Diagnosis not present

## 2021-03-23 DIAGNOSIS — D631 Anemia in chronic kidney disease: Secondary | ICD-10-CM | POA: Diagnosis present

## 2021-03-23 DIAGNOSIS — N183 Chronic kidney disease, stage 3 unspecified: Secondary | ICD-10-CM

## 2021-03-23 DIAGNOSIS — E1151 Type 2 diabetes mellitus with diabetic peripheral angiopathy without gangrene: Secondary | ICD-10-CM | POA: Diagnosis present

## 2021-03-23 DIAGNOSIS — E1169 Type 2 diabetes mellitus with other specified complication: Secondary | ICD-10-CM

## 2021-03-23 DIAGNOSIS — Z9049 Acquired absence of other specified parts of digestive tract: Secondary | ICD-10-CM | POA: Diagnosis not present

## 2021-03-23 DIAGNOSIS — Z79890 Hormone replacement therapy (postmenopausal): Secondary | ICD-10-CM | POA: Diagnosis not present

## 2021-03-23 DIAGNOSIS — N132 Hydronephrosis with renal and ureteral calculous obstruction: Secondary | ICD-10-CM | POA: Diagnosis present

## 2021-03-23 DIAGNOSIS — K219 Gastro-esophageal reflux disease without esophagitis: Secondary | ICD-10-CM | POA: Diagnosis present

## 2021-03-23 DIAGNOSIS — N1832 Chronic kidney disease, stage 3b: Secondary | ICD-10-CM

## 2021-03-23 DIAGNOSIS — D509 Iron deficiency anemia, unspecified: Secondary | ICD-10-CM | POA: Diagnosis present

## 2021-03-23 DIAGNOSIS — I129 Hypertensive chronic kidney disease with stage 1 through stage 4 chronic kidney disease, or unspecified chronic kidney disease: Secondary | ICD-10-CM | POA: Diagnosis present

## 2021-03-23 DIAGNOSIS — E1122 Type 2 diabetes mellitus with diabetic chronic kidney disease: Secondary | ICD-10-CM | POA: Diagnosis present

## 2021-03-23 DIAGNOSIS — Z7982 Long term (current) use of aspirin: Secondary | ICD-10-CM | POA: Diagnosis not present

## 2021-03-23 DIAGNOSIS — I10 Essential (primary) hypertension: Secondary | ICD-10-CM

## 2021-03-23 DIAGNOSIS — N400 Enlarged prostate without lower urinary tract symptoms: Secondary | ICD-10-CM | POA: Diagnosis present

## 2021-03-23 DIAGNOSIS — Z20822 Contact with and (suspected) exposure to covid-19: Secondary | ICD-10-CM | POA: Diagnosis present

## 2021-03-23 DIAGNOSIS — N201 Calculus of ureter: Secondary | ICD-10-CM | POA: Diagnosis not present

## 2021-03-23 LAB — HEMOGLOBIN A1C
Hgb A1c MFr Bld: 6.7 % — ABNORMAL HIGH (ref 4.8–5.6)
Mean Plasma Glucose: 145.59 mg/dL

## 2021-03-23 LAB — BASIC METABOLIC PANEL
Anion gap: 6 (ref 5–15)
BUN: 29 mg/dL — ABNORMAL HIGH (ref 8–23)
CO2: 23 mmol/L (ref 22–32)
Calcium: 9 mg/dL (ref 8.9–10.3)
Chloride: 108 mmol/L (ref 98–111)
Creatinine, Ser: 2.06 mg/dL — ABNORMAL HIGH (ref 0.61–1.24)
GFR, Estimated: 30 mL/min — ABNORMAL LOW (ref >60)
Glucose, Bld: 148 mg/dL — ABNORMAL HIGH (ref 70–99)
Potassium: 4.6 mmol/L (ref 3.5–5.1)
Sodium: 137 mmol/L (ref 135–145)

## 2021-03-23 LAB — CBC
HCT: 35 % — ABNORMAL LOW (ref 39.0–52.0)
Hemoglobin: 11.6 g/dL — ABNORMAL LOW (ref 13.0–17.0)
MCH: 33.5 pg (ref 26.0–34.0)
MCHC: 33.1 g/dL (ref 30.0–36.0)
MCV: 101.2 fL — ABNORMAL HIGH (ref 80.0–100.0)
Platelets: 176 10*3/uL (ref 150–400)
RBC: 3.46 MIL/uL — ABNORMAL LOW (ref 4.22–5.81)
RDW: 13.5 % (ref 11.5–15.5)
WBC: 12.1 10*3/uL — ABNORMAL HIGH (ref 4.0–10.5)
nRBC: 0 % (ref 0.0–0.2)

## 2021-03-23 LAB — GLUCOSE, CAPILLARY
Glucose-Capillary: 123 mg/dL — ABNORMAL HIGH (ref 70–99)
Glucose-Capillary: 137 mg/dL — ABNORMAL HIGH (ref 70–99)
Glucose-Capillary: 162 mg/dL — ABNORMAL HIGH (ref 70–99)
Glucose-Capillary: 239 mg/dL — ABNORMAL HIGH (ref 70–99)
Glucose-Capillary: 242 mg/dL — ABNORMAL HIGH (ref 70–99)

## 2021-03-23 LAB — CREATININE, SERUM
Creatinine, Ser: 2.05 mg/dL — ABNORMAL HIGH (ref 0.61–1.24)
GFR, Estimated: 30 mL/min — ABNORMAL LOW (ref >60)

## 2021-03-23 MED ORDER — INSULIN GLARGINE 100 UNIT/ML ~~LOC~~ SOLN
15.0000 [IU] | Freq: Every morning | SUBCUTANEOUS | Status: DC
  Administered 2021-03-23 – 2021-03-28 (×5): 15 [IU] via SUBCUTANEOUS
  Filled 2021-03-23 (×5): qty 0.15

## 2021-03-23 MED ORDER — SODIUM CHLORIDE 0.9 % IV SOLN
1.0000 g | INTRAVENOUS | Status: DC
  Administered 2021-03-23 – 2021-03-28 (×6): 1 g via INTRAVENOUS
  Filled 2021-03-23 (×2): qty 1
  Filled 2021-03-23 (×2): qty 10
  Filled 2021-03-23 (×2): qty 1

## 2021-03-23 MED ORDER — DOCUSATE SODIUM 100 MG PO CAPS
100.0000 mg | ORAL_CAPSULE | Freq: Two times a day (BID) | ORAL | Status: DC
  Administered 2021-03-23 – 2021-03-27 (×8): 100 mg via ORAL
  Filled 2021-03-23 (×12): qty 1

## 2021-03-23 MED ORDER — POLYETHYLENE GLYCOL 3350 17 G PO PACK
17.0000 g | PACK | Freq: Two times a day (BID) | ORAL | Status: DC
  Administered 2021-03-23 – 2021-03-27 (×7): 17 g via ORAL
  Filled 2021-03-23 (×10): qty 1

## 2021-03-23 MED ORDER — INSULIN ASPART 100 UNIT/ML ~~LOC~~ SOLN
0.0000 [IU] | SUBCUTANEOUS | Status: DC
  Administered 2021-03-23: 2 [IU] via SUBCUTANEOUS
  Administered 2021-03-23: 3 [IU] via SUBCUTANEOUS
  Administered 2021-03-24: 1 [IU] via SUBCUTANEOUS
  Administered 2021-03-24 (×2): 3 [IU] via SUBCUTANEOUS
  Administered 2021-03-25 (×3): 1 [IU] via SUBCUTANEOUS
  Administered 2021-03-25 (×3): 3 [IU] via SUBCUTANEOUS
  Administered 2021-03-26: 1 [IU] via SUBCUTANEOUS
  Filled 2021-03-23 (×12): qty 1

## 2021-03-23 MED ORDER — TAMSULOSIN HCL 0.4 MG PO CAPS
0.4000 mg | ORAL_CAPSULE | Freq: Every day | ORAL | Status: DC
  Administered 2021-03-23 – 2021-03-28 (×5): 0.4 mg via ORAL
  Filled 2021-03-23 (×5): qty 1

## 2021-03-23 MED ORDER — CHLORHEXIDINE GLUCONATE CLOTH 2 % EX PADS
6.0000 | MEDICATED_PAD | Freq: Every day | CUTANEOUS | Status: DC
  Administered 2021-03-24: 6 via TOPICAL

## 2021-03-23 NOTE — Progress Notes (Signed)
Vomiting stopped, patient reported relief around 0830, ate some breakfast.  Vomiting again at 1100.  MD notified. Order for NPO. PRN zofran per MAR.

## 2021-03-23 NOTE — Anesthesia Preprocedure Evaluation (Deleted)
Anesthesia Evaluation  Patient identified by MRN, date of birth, ID band Patient awake    Reviewed: Allergy & Precautions, NPO status , Patient's Chart, lab work & pertinent test results  Airway Mallampati: III       Dental   Pulmonary shortness of breath and with exertion,    Pulmonary exam normal        Cardiovascular hypertension, + CAD, + Past MI, + Cardiac Stents and + Peripheral Vascular Disease  Normal cardiovascular exam  Normal stress test 2018 TTE 2021: NORMAL LEFT VENTRICULAR SYSTOLIC FUNCTION WITH AN ESTIMATED EF = 55 %  NORMAL RIGHT VENTRICULAR SYSTOLIC FUNCTION  MODERATE TRICUSPID AND MITRAL VALVE INSUFFICIENCY  NO VALVULAR STENOSIS  MILD RV ENLARGEMENT  MILD BIATRIAL ENLARGEMENT  MILD LVH     Neuro/Psych negative neurological ROS  negative psych ROS   GI/Hepatic Neg liver ROS, GERD  Medicated and Controlled,  Endo/Other  diabetesHypothyroidism   Renal/GU Renal disease     Musculoskeletal  (+) Arthritis ,   Abdominal Normal abdominal exam  (+)   Peds negative pediatric ROS (+)  Hematology  (+) anemia ,   Anesthesia Other Findings Past Medical History: No date: Arthritis No date: CAD (coronary artery disease)     Comment:  3 stents No date: Colon polyp No date: Diabetes mellitus     Comment:  type II No date: Dyspnea     Comment:  with exertion No date: GERD (gastroesophageal reflux disease) No date: Hyperlipidemia No date: Hypertension No date: Hypothyroidism No date: Kidney stones     Comment:  stage III CKD No date: Neuropathy No date: Peripheral vascular disease ([facility])     Comment:  neuropathy in feet  Reproductive/Obstetrics                                                             Anesthesia Evaluation  Patient identified by MRN, date of birth, ID band Patient awake    Reviewed: Allergy & Precautions, NPO status , Patient's Chart, lab work & pertinent  test results  History of Anesthesia Complications Negative for: history of anesthetic complications  Airway Mallampati: III       Dental   Pulmonary neg sleep apnea, neg COPD,           Cardiovascular hypertension, Pt. on medications + CAD, + Past MI, + Cardiac Stents and + Peripheral Vascular Disease  (-) CHF (-) dysrhythmias (-) Valvular Problems/Murmurs     Neuro/Psych neg Seizures    GI/Hepatic Neg liver ROS, GERD  Medicated and Controlled,  Endo/Other  diabetes, Type 2, Oral Hypoglycemic Agents, Insulin DependentHypothyroidism   Renal/GU Renal InsufficiencyRenal disease     Musculoskeletal   Abdominal   Peds  Hematology   Anesthesia Other Findings   Reproductive/Obstetrics                            Anesthesia Physical Anesthesia Plan  ASA: III  Anesthesia Plan: General   Post-op Pain Management: GA combined w/ Regional for post-op pain   Induction: Intravenous  PONV Risk Score and Plan: 2 and Dexamethasone and Ondansetron  Airway Management Planned: Oral ETT  Additional Equipment:   Intra-op Plan:   Post-operative Plan:   Informed Consent: I have reviewed the patients  History and Physical, chart, labs and discussed the procedure including the risks, benefits and alternatives for the proposed anesthesia with the patient or authorized representative who has indicated his/her understanding and acceptance.     Plan Discussed with:   Anesthesia Plan Comments:         Anesthesia Quick Evaluation                                   Anesthesia Evaluation  Patient identified by MRN, date of birth, ID band Patient awake    Reviewed: Allergy & Precautions, NPO status , Patient's Chart, lab work & pertinent test results  History of Anesthesia Complications Negative for: history of anesthetic complications  Airway Mallampati: III       Dental   Pulmonary neg sleep apnea, neg COPD,            Cardiovascular hypertension, Pt. on medications + CAD, + Past MI, + Cardiac Stents and + Peripheral Vascular Disease  (-) CHF (-) dysrhythmias (-) Valvular Problems/Murmurs     Neuro/Psych neg Seizures    GI/Hepatic Neg liver ROS, GERD  Medicated and Controlled,  Endo/Other  diabetes, Type 2, Oral Hypoglycemic Agents, Insulin DependentHypothyroidism   Renal/GU Renal InsufficiencyRenal disease     Musculoskeletal   Abdominal   Peds  Hematology   Anesthesia Other Findings   Reproductive/Obstetrics                            Anesthesia Physical Anesthesia Plan  ASA: III  Anesthesia Plan: General   Post-op Pain Management: GA combined w/ Regional for post-op pain   Induction: Intravenous  PONV Risk Score and Plan: 2 and Dexamethasone and Ondansetron  Airway Management Planned: Oral ETT  Additional Equipment:   Intra-op Plan:   Post-operative Plan:   Informed Consent: I have reviewed the patients History and Physical, chart, labs and discussed the procedure including the risks, benefits and alternatives for the proposed anesthesia with the patient or authorized representative who has indicated his/her understanding and acceptance.     Plan Discussed with:   Anesthesia Plan Comments:         Anesthesia Quick Evaluation  Anesthesia Physical Anesthesia Plan  ASA: III  Anesthesia Plan: General   Post-op Pain Management:    Induction: Intravenous  PONV Risk Score and Plan:   Airway Management Planned: Oral ETT  Additional Equipment:   Intra-op Plan:   Post-operative Plan: Extubation in OR  Informed Consent: I have reviewed the patients History and Physical, chart, labs and discussed the procedure including the risks, benefits and alternatives for the proposed anesthesia with the patient or authorized representative who has indicated his/her understanding and acceptance.     Dental advisory  given  Plan Discussed with: CRNA and Surgeon  Anesthesia Plan Comments:         Anesthesia Quick Evaluation

## 2021-03-23 NOTE — Consult Note (Signed)
Urology Consult  Requesting physician: Cordelia Poche, MD  Reason for consultation: Ureteral calculus  Chief Complaint: Pain, nausea  History of Present Illness: David Henry is a 87 y.o. male with a history of coronary artery disease, diabetes, hypertension who presented to the [facility] ED 03/22/2021 for evaluation of left back pain   Pain onset the morning of a ED visit  Bilateral back pain but worse left flank region  No identifiable precipitating or aggravating factors  Severity rated moderate to severe  + Nausea/vomiting  No fever or chills  + Frequency, urgency with sensation of incomplete emptying  Stone protocol CT remarkable for a 3 mm left distal ureteral calculus near the UVJ with mild hydronephrosis  ED initially plan to discharge for outpatient management however patient had recurrent nausea/vomiting and admission requested by family  Still complains of left flank pain, nausea and vomiting  Prior history of a stone several years ago  Afebrile since admission  On tamsulosin by PCP for BPH  Past Medical History:  Diagnosis Date  . Arthritis   . CAD (coronary artery disease)    3 stents  . Colon polyp   . Diabetes mellitus    type II  . Dyspnea    with exertion  . GERD (gastroesophageal reflux disease)   . Hyperlipidemia   . Hypertension   . Hypothyroidism   . Kidney stones    stage III CKD  . Neuropathy   . Peripheral vascular disease (HCC)    neuropathy in feet    Past Surgical History:  Procedure Laterality Date  . APPENDECTOMY  1984  . CATARACT EXTRACTION Bilateral 04/2001  . CHOLECYSTECTOMY  1989  . COLONOSCOPY N/A 2/14   hemorrhoids and tics (after pos ifob card)  . CORONARY ANGIOPLASTY  2006   3 stents  . ESOPHAGOGASTRODUODENOSCOPY N/A 2/14  . RETINAL DETACHMENT SURGERY Right 2003  . SHOULDER ARTHROSCOPY WITH ROTATOR CUFF REPAIR AND SUBACROMIAL DECOMPRESSION Right 12/30/2017   Procedure: SHOULDER ARTHROSCOPY WITH ROTATOR CUFF  REPAIR AND SUBACROMIAL DECOMPRESSION;  Surgeon: Leanor Kail, MD;  Location: [facility] ORS;  Service: Orthopedics;  Laterality: Right;  . SHOULDER SURGERY  2012   Lt shoulder repair    Home Medications:  Current Meds  Medication Sig  . glimepiride (AMARYL) 4 MG tablet Take 1 tablet (4 mg total) by mouth 2 (two) times daily.  . insulin glargine (LANTUS SOLOSTAR) 100 UNIT/ML Solostar Pen INJECT 15 UNITS INTO THE SKIN EVERY MORNING  . levothyroxine (SYNTHROID) 50 MCG tablet Take 1 tablet (50 mcg total) by mouth daily before breakfast.  . lisinopril (ZESTRIL) 20 MG tablet Take 1 tablet (20 mg total) by mouth daily.  . ondansetron (ZOFRAN ODT) 4 MG disintegrating tablet Take 1 tablet (4 mg total) by mouth every 8 (eight) hours as needed for nausea or vomiting.  Marland Kitchen oxyCODONE (ROXICODONE) 5 MG immediate release tablet Take 1 tablet (5 mg total) by mouth every 8 (eight) hours as needed.  . pantoprazole (PROTONIX) 40 MG tablet Take 1 tablet (40 mg total) by mouth daily.  . rosuvastatin (CRESTOR) 5 MG tablet Take 1 tablet (5 mg total) by mouth every other day.  . sitaGLIPtin (JANUVIA) 50 MG tablet Take 1 tablet (50 mg total) by mouth daily.  . tamsulosin (FLOMAX) 0.4 MG CAPS capsule Take 1 capsule (0.4 mg total) by mouth daily for 14 days.    Allergies:  Allergies  Allergen Reactions  . Adhesive [Tape] Dermatitis    Skin is thin so  it is tough to take off.  Use paper tape  . Azithromycin Nausea Only    REACTION: nausea  . Celecoxib Other (See Comments)    Raises blood pressure  . Zocor [Simvastatin] Other (See Comments)    Muscle pain    Family History  Problem Relation Age of Onset  . Cancer Brother        prostate CA  . Heart disease Mother   . Stroke Father   . Kidney disease Sister   . Kidney disease Sister   . Clotting disorder Sister   . COPD Brother   . Heart disease Brother     Social History:  reports that he has never smoked. He has never used smokeless tobacco. He reports  that he does not drink alcohol and does not use drugs.  ROS: A complete review of systems was performed.  All systems are negative except for pertinent findings as noted.  Physical Exam:  Vital signs in last 24 hours: Temp:  [97.6 F (36.4 C)-98.9 F (37.2 C)] 98.9 F (37.2 C) (04/02 0847) Pulse Rate:  [52-97] 88 (04/02 0847) Resp:  [16-20] 20 (04/02 0847) BP: (132-189)/(49-84) 153/66 (04/02 0847) SpO2:  [95 %-100 %] 97 % (04/02 0847) Weight:  [70.3 kg-72 kg] 72 kg (04/02 0104) Constitutional:  Alert, mild distress secondary to pain/nausea HEENT: [place] AT, moist mucus membranes.  Trachea midline, no masses Cardiovascular: Regular rate and rhythm, no clubbing, cyanosis, or edema. Respiratory: Normal respiratory effort, lungs clear bilaterally GI: Abdomen is soft, nontender, nondistended, no abdominal masses GU: No CVA tenderness Skin: No rashes, bruises or suspicious lesions Lymph: No cervical or inguinal adenopathy Neurologic: Grossly intact, no focal deficits, moving all 4 extremities Psychiatric: Normal mood and affect   Laboratory Data:  Recent Labs    03/22/21 1414 03/23/21 0230  WBC 10.2 12.1*  HGB 12.7* 11.6*  HCT 37.9* 35.0*   Recent Labs    03/22/21 1414 03/23/21 0230  NA 137 137  K 4.7 4.6  CL 106 108  CO2 22 23  GLUCOSE 205* 148*  BUN 25* 29*  CREATININE 1.79* 2.06*  2.05*  CALCIUM 9.4 9.0   No results for input(s): LABPT, INR in the last 72 hours. No results for input(s): LABURIN in the last 72 hours. Results for orders placed or performed during the hospital encounter of 03/22/21  Resp Panel by RT-PCR (Flu A&B, Covid) Nasopharyngeal Swab     Status: None   Collection Time: 03/22/21 10:55 PM   Specimen: Nasopharyngeal Swab; Nasopharyngeal(NP) swabs in vial transport medium  Result Value Ref Range Status   SARS Coronavirus 2 by RT PCR NEGATIVE NEGATIVE Final    Comment: (NOTE) SARS-CoV-2 target nucleic acids are NOT DETECTED.  The SARS-CoV-2 RNA is  generally detectable in upper respiratory specimens during the acute phase of infection. The lowest concentration of SARS-CoV-2 viral copies this assay can detect is 138 copies/mL. A negative result does not preclude SARS-Cov-2 infection and should not be used as the sole basis for treatment or other patient management decisions. A negative result may occur with  improper specimen collection/handling, submission of specimen other than nasopharyngeal swab, presence of viral mutation(s) within the areas targeted by this assay, and inadequate number of viral copies(<138 copies/mL). A negative result must be combined with clinical observations, patient history, and epidemiological information. The expected result is Negative.  Fact Sheet for Patients:  EntrepreneurPulse.com.au  Fact Sheet for Healthcare Providers:  IncredibleEmployment.be  This test is no t yet approved  or cleared by the [place] and  has been authorized for detection and/or diagnosis of SARS-CoV-2 by FDA under an Emergency Use Authorization (EUA). This EUA will remain  in effect (meaning this test can be used) for the duration of the COVID-19 declaration under Section 564(b)(1) of the Act, 21 U.S.C.section 360bbb-3(b)(1), unless the authorization is terminated  or revoked sooner.       Influenza A by PCR NEGATIVE NEGATIVE Final   Influenza B by PCR NEGATIVE NEGATIVE Final    Comment: (NOTE) The Xpert Xpress SARS-CoV-2/FLU/RSV plus assay is intended as an aid in the diagnosis of influenza from Nasopharyngeal swab specimens and should not be used as a sole basis for treatment. Nasal washings and aspirates are unacceptable for Xpert Xpress SARS-CoV-2/FLU/RSV testing.  Fact Sheet for Patients: EntrepreneurPulse.com.au  Fact Sheet for Healthcare Providers: IncredibleEmployment.be  This test is not yet approved or cleared by the [place] FDA and has been authorized for detection and/or diagnosis of SARS-CoV-2 by FDA under an Emergency Use Authorization (EUA). This EUA will remain in effect (meaning this test can be used) for the duration of the COVID-19 declaration under Section 564(b)(1) of the Act, 21 U.S.C. section 360bbb-3(b)(1), unless the authorization is terminated or revoked.  Performed at [facility], [address], [place], [place] [address]      Radiologic Imaging: CT images were personally reviewed and interpreted  CT Renal Stone Study  Result Date: 03/22/2021 CLINICAL DATA:  Hematuria flank pain EXAM: CT ABDOMEN AND PELVIS WITHOUT CONTRAST TECHNIQUE: Multidetector CT imaging of the abdomen and pelvis was performed following the standard protocol without IV contrast. COMPARISON:  CT 06/10/2013 FINDINGS: Lower chest: Lung bases demonstrate no acute consolidation or effusion. Mild subpleural fibrosis on the right. Borderline cardiac size. Small pericardial effusion. Small hiatal hernia. Hepatobiliary: No focal liver abnormality is seen. Status post cholecystectomy. No biliary dilatation. Pancreas: Unremarkable. No pancreatic ductal dilatation or surrounding inflammatory changes. Fatty atrophy Spleen: Normal in size without focal abnormality. Adrenals/Urinary Tract: Adrenal glands are normal. 7.6 cm cyst exophytic to the upper pole left kidney. Subcentimeter cortical hyperdensity mid right kidney without change and likely representing hemorrhagic or proteinaceous cyst. Bilateral parapelvic cysts. Mild to moderate hydronephrosis on the left with hydroureter. This is secondary to a 3 mm stone in the distal left ureter about a cm proximal to the left UVJ. Bladder is nearly empty Stomach/Bowel: Stomach is within normal limits. Status post appendectomy. Extensive sigmoid colon diverticular disease without acute inflammatory change. No evidence of bowel wall thickening, distention, or inflammatory changes.  Vascular/Lymphatic: Nonaneurysmal aorta. Moderate aortic atherosclerosis. No suspicious nodes. Reproductive: Enlarged prostate gland Other: Negative for free air or free fluid. Small fat containing left inguinal hernia. Musculoskeletal: Degenerative changes of the spine. No acute osseous abnormality IMPRESSION: 1. Mild to moderate left hydronephrosis and hydroureter secondary to a 3 mm stone in the distal left ureter about a cm proximal to the left UVJ. 2. Extensive sigmoid colon diverticular disease without acute inflammatory change. 3. Multiple bilateral renal cysts including parapelvic cysts. Aortic Atherosclerosis (ICD10-I70.0). Electronically Signed   By: Donavan Foil M.D.   On: 03/22/2021 16:45    Impression/Assessment:   1.  Left distal ureteral calculus  3 mm distal ureteral calculus near the UVJ >90% chance this calculus will pass though still with pain/nausea that would preclude outpatient management  2.  Renal colic with nausea/vomiting  Secondary to above  Recommendation:   1.  Continue trial of passage today  2.  If persistent symptoms without stone passage and cleared for anesthesia recommend left ureteroscopy with stone removal and stent placement 03/24/2021.  The procedure was discussed in detail including potential risks of bleeding, infection, ureteral injury as well as anesthetic risks.  The need for postoperative ureteral stent was also discussed with the possibility of stent symptoms.  All questions were answered and he desires to proceed.  3.  Will tentatively add onto OR schedule for tomorrow.  4.  Contacted his daughter Hendricks Milo and she was agreeable to this plan.   03/23/2021, 11:29 AM  John Giovanni,  MD

## 2021-03-23 NOTE — Progress Notes (Signed)
Pt admitted to [facility] via Wheelchair, on RA with his belongings accompanied by the Transporter.

## 2021-03-23 NOTE — Plan of Care (Signed)
Pt vomited earlier this am dark brown mucus secretions and also c/o of L flank pain 5/10. Prn Morphine adm instead of Norco due to vomiting in progress. Zofran iv adm as well. Safety measures in place. Will continue to monitor.  Problem: Education: Goal: Knowledge of General Education information will improve Description: Including pain rating scale, medication(s)/side effects and non-pharmacologic comfort measures Outcome: Progressing   Problem: Health Behavior/Discharge Planning: Goal: Ability to manage health-related needs will improve Outcome: Progressing   Problem: Clinical Measurements: Goal: Ability to maintain clinical measurements within normal limits will improve Outcome: Progressing Goal: Will remain free from infection Outcome: Progressing Goal: Diagnostic test results will improve Outcome: Progressing Goal: Respiratory complications will improve Outcome: Progressing Goal: Cardiovascular complication will be avoided Outcome: Progressing   Problem: Activity: Goal: Risk for activity intolerance will decrease Outcome: Progressing   Problem: Nutrition: Goal: Adequate nutrition will be maintained Outcome: Progressing   Problem: Coping: Goal: Level of anxiety will decrease Outcome: Progressing   Problem: Elimination: Goal: Will not experience complications related to bowel motility Outcome: Progressing Goal: Will not experience complications related to urinary retention Outcome: Progressing   Problem: Pain Managment: Goal: General experience of comfort will improve Outcome: Progressing   Problem: Safety: Goal: Ability to remain free from injury will improve Outcome: Progressing   Problem: Skin Integrity: Goal: Risk for impaired skin integrity will decrease Outcome: Progressing

## 2021-03-23 NOTE — H&P (View-Only) (Signed)
Urology Consult  Requesting physician: Cordelia Poche, MD  Reason for consultation: Ureteral calculus  Chief Complaint: Pain, nausea  History of Present Illness: David Henry is a 87 y.o. male with a history of coronary artery disease, diabetes, hypertension who presented to the [facility] ED 03/22/2021 for evaluation of left back pain   Pain onset the morning of a ED visit  Bilateral back pain but worse left flank region  No identifiable precipitating or aggravating factors  Severity rated moderate to severe  + Nausea/vomiting  No fever or chills  + Frequency, urgency with sensation of incomplete emptying  Stone protocol CT remarkable for a 3 mm left distal ureteral calculus near the UVJ with mild hydronephrosis  ED initially plan to discharge for outpatient management however patient had recurrent nausea/vomiting and admission requested by family  Still complains of left flank pain, nausea and vomiting  Prior history of a stone several years ago  Afebrile since admission  On tamsulosin by PCP for BPH  Past Medical History:  Diagnosis Date  . Arthritis   . CAD (coronary artery disease)    3 stents  . Colon polyp   . Diabetes mellitus    type II  . Dyspnea    with exertion  . GERD (gastroesophageal reflux disease)   . Hyperlipidemia   . Hypertension   . Hypothyroidism   . Kidney stones    stage III CKD  . Neuropathy   . Peripheral vascular disease (HCC)    neuropathy in feet    Past Surgical History:  Procedure Laterality Date  . APPENDECTOMY  1984  . CATARACT EXTRACTION Bilateral 04/2001  . CHOLECYSTECTOMY  1989  . COLONOSCOPY N/A 2/14   hemorrhoids and tics (after pos ifob card)  . CORONARY ANGIOPLASTY  2006   3 stents  . ESOPHAGOGASTRODUODENOSCOPY N/A 2/14  . RETINAL DETACHMENT SURGERY Right 2003  . SHOULDER ARTHROSCOPY WITH ROTATOR CUFF REPAIR AND SUBACROMIAL DECOMPRESSION Right 12/30/2017   Procedure: SHOULDER ARTHROSCOPY WITH ROTATOR CUFF  REPAIR AND SUBACROMIAL DECOMPRESSION;  Surgeon: Leanor Kail, MD;  Location: [facility] ORS;  Service: Orthopedics;  Laterality: Right;  . SHOULDER SURGERY  2012   Lt shoulder repair    Home Medications:  Current Meds  Medication Sig  . glimepiride (AMARYL) 4 MG tablet Take 1 tablet (4 mg total) by mouth 2 (two) times daily.  . insulin glargine (LANTUS SOLOSTAR) 100 UNIT/ML Solostar Pen INJECT 15 UNITS INTO THE SKIN EVERY MORNING  . levothyroxine (SYNTHROID) 50 MCG tablet Take 1 tablet (50 mcg total) by mouth daily before breakfast.  . lisinopril (ZESTRIL) 20 MG tablet Take 1 tablet (20 mg total) by mouth daily.  . ondansetron (ZOFRAN ODT) 4 MG disintegrating tablet Take 1 tablet (4 mg total) by mouth every 8 (eight) hours as needed for nausea or vomiting.  Marland Kitchen oxyCODONE (ROXICODONE) 5 MG immediate release tablet Take 1 tablet (5 mg total) by mouth every 8 (eight) hours as needed.  . pantoprazole (PROTONIX) 40 MG tablet Take 1 tablet (40 mg total) by mouth daily.  . rosuvastatin (CRESTOR) 5 MG tablet Take 1 tablet (5 mg total) by mouth every other day.  . sitaGLIPtin (JANUVIA) 50 MG tablet Take 1 tablet (50 mg total) by mouth daily.  . tamsulosin (FLOMAX) 0.4 MG CAPS capsule Take 1 capsule (0.4 mg total) by mouth daily for 14 days.    Allergies:  Allergies  Allergen Reactions  . Adhesive [Tape] Dermatitis    Skin is thin so  it is tough to take off.  Use paper tape  . Azithromycin Nausea Only    REACTION: nausea  . Celecoxib Other (See Comments)    Raises blood pressure  . Zocor [Simvastatin] Other (See Comments)    Muscle pain    Family History  Problem Relation Age of Onset  . Cancer Brother        prostate CA  . Heart disease Mother   . Stroke Father   . Kidney disease Sister   . Kidney disease Sister   . Clotting disorder Sister   . COPD Brother   . Heart disease Brother     Social History:  reports that he has never smoked. He has never used smokeless tobacco. He reports  that he does not drink alcohol and does not use drugs.  ROS: A complete review of systems was performed.  All systems are negative except for pertinent findings as noted.  Physical Exam:  Vital signs in last 24 hours: Temp:  [97.6 F (36.4 C)-98.9 F (37.2 C)] 98.9 F (37.2 C) (04/02 0847) Pulse Rate:  [52-97] 88 (04/02 0847) Resp:  [16-20] 20 (04/02 0847) BP: (132-189)/(49-84) 153/66 (04/02 0847) SpO2:  [95 %-100 %] 97 % (04/02 0847) Weight:  [70.3 kg-72 kg] 72 kg (04/02 0104) Constitutional:  Alert, mild distress secondary to pain/nausea HEENT: [place] AT, moist mucus membranes.  Trachea midline, no masses Cardiovascular: Regular rate and rhythm, no clubbing, cyanosis, or edema. Respiratory: Normal respiratory effort, lungs clear bilaterally GI: Abdomen is soft, nontender, nondistended, no abdominal masses GU: No CVA tenderness Skin: No rashes, bruises or suspicious lesions Lymph: No cervical or inguinal adenopathy Neurologic: Grossly intact, no focal deficits, moving all 4 extremities Psychiatric: Normal mood and affect   Laboratory Data:  Recent Labs    03/22/21 1414 03/23/21 0230  WBC 10.2 12.1*  HGB 12.7* 11.6*  HCT 37.9* 35.0*   Recent Labs    03/22/21 1414 03/23/21 0230  NA 137 137  K 4.7 4.6  CL 106 108  CO2 22 23  GLUCOSE 205* 148*  BUN 25* 29*  CREATININE 1.79* 2.06*  2.05*  CALCIUM 9.4 9.0   No results for input(s): LABPT, INR in the last 72 hours. No results for input(s): LABURIN in the last 72 hours. Results for orders placed or performed during the hospital encounter of 03/22/21  Resp Panel by RT-PCR (Flu A&B, Covid) Nasopharyngeal Swab     Status: None   Collection Time: 03/22/21 10:55 PM   Specimen: Nasopharyngeal Swab; Nasopharyngeal(NP) swabs in vial transport medium  Result Value Ref Range Status   SARS Coronavirus 2 by RT PCR NEGATIVE NEGATIVE Final    Comment: (NOTE) SARS-CoV-2 target nucleic acids are NOT DETECTED.  The SARS-CoV-2 RNA is  generally detectable in upper respiratory specimens during the acute phase of infection. The lowest concentration of SARS-CoV-2 viral copies this assay can detect is 138 copies/mL. A negative result does not preclude SARS-Cov-2 infection and should not be used as the sole basis for treatment or other patient management decisions. A negative result may occur with  improper specimen collection/handling, submission of specimen other than nasopharyngeal swab, presence of viral mutation(s) within the areas targeted by this assay, and inadequate number of viral copies(<138 copies/mL). A negative result must be combined with clinical observations, patient history, and epidemiological information. The expected result is Negative.  Fact Sheet for Patients:  EntrepreneurPulse.com.au  Fact Sheet for Healthcare Providers:  IncredibleEmployment.be  This test is no t yet approved  or cleared by the [place] and  has been authorized for detection and/or diagnosis of SARS-CoV-2 by FDA under an Emergency Use Authorization (EUA). This EUA will remain  in effect (meaning this test can be used) for the duration of the COVID-19 declaration under Section 564(b)(1) of the Act, 21 U.S.C.section 360bbb-3(b)(1), unless the authorization is terminated  or revoked sooner.       Influenza A by PCR NEGATIVE NEGATIVE Final   Influenza B by PCR NEGATIVE NEGATIVE Final    Comment: (NOTE) The Xpert Xpress SARS-CoV-2/FLU/RSV plus assay is intended as an aid in the diagnosis of influenza from Nasopharyngeal swab specimens and should not be used as a sole basis for treatment. Nasal washings and aspirates are unacceptable for Xpert Xpress SARS-CoV-2/FLU/RSV testing.  Fact Sheet for Patients: EntrepreneurPulse.com.au  Fact Sheet for Healthcare Providers: IncredibleEmployment.be  This test is not yet approved or cleared by the [place] FDA and has been authorized for detection and/or diagnosis of SARS-CoV-2 by FDA under an Emergency Use Authorization (EUA). This EUA will remain in effect (meaning this test can be used) for the duration of the COVID-19 declaration under Section 564(b)(1) of the Act, 21 U.S.C. section 360bbb-3(b)(1), unless the authorization is terminated or revoked.  Performed at [facility], [address] [place], [place] [address]      Radiologic Imaging: CT images were personally reviewed and interpreted  CT Renal Stone Study  Result Date: 03/22/2021 CLINICAL DATA:  Hematuria flank pain EXAM: CT ABDOMEN AND PELVIS WITHOUT CONTRAST TECHNIQUE: Multidetector CT imaging of the abdomen and pelvis was performed following the standard protocol without IV contrast. COMPARISON:  CT 06/10/2013 FINDINGS: Lower chest: Lung bases demonstrate no acute consolidation or effusion. Mild subpleural fibrosis on the right. Borderline cardiac size. Small pericardial effusion. Small hiatal hernia. Hepatobiliary: No focal liver abnormality is seen. Status post cholecystectomy. No biliary dilatation. Pancreas: Unremarkable. No pancreatic ductal dilatation or surrounding inflammatory changes. Fatty atrophy Spleen: Normal in size without focal abnormality. Adrenals/Urinary Tract: Adrenal glands are normal. 7.6 cm cyst exophytic to the upper pole left kidney. Subcentimeter cortical hyperdensity mid right kidney without change and likely representing hemorrhagic or proteinaceous cyst. Bilateral parapelvic cysts. Mild to moderate hydronephrosis on the left with hydroureter. This is secondary to a 3 mm stone in the distal left ureter about a cm proximal to the left UVJ. Bladder is nearly empty Stomach/Bowel: Stomach is within normal limits. Status post appendectomy. Extensive sigmoid colon diverticular disease without acute inflammatory change. No evidence of bowel wall thickening, distention, or inflammatory changes.  Vascular/Lymphatic: Nonaneurysmal aorta. Moderate aortic atherosclerosis. No suspicious nodes. Reproductive: Enlarged prostate gland Other: Negative for free air or free fluid. Small fat containing left inguinal hernia. Musculoskeletal: Degenerative changes of the spine. No acute osseous abnormality IMPRESSION: 1. Mild to moderate left hydronephrosis and hydroureter secondary to a 3 mm stone in the distal left ureter about a cm proximal to the left UVJ. 2. Extensive sigmoid colon diverticular disease without acute inflammatory change. 3. Multiple bilateral renal cysts including parapelvic cysts. Aortic Atherosclerosis (ICD10-I70.0). Electronically Signed   By: Donavan Foil M.D.   On: 03/22/2021 16:45    Impression/Assessment:   1.  Left distal ureteral calculus  3 mm distal ureteral calculus near the UVJ >90% chance this calculus will pass though still with pain/nausea that would preclude outpatient management  2.  Renal colic with nausea/vomiting  Secondary to above  Recommendation:   1.  Continue trial of passage today  2.  If persistent symptoms without stone passage and cleared for anesthesia recommend left ureteroscopy with stone removal and stent placement 03/24/2021.  The procedure was discussed in detail including potential risks of bleeding, infection, ureteral injury as well as anesthetic risks.  The need for postoperative ureteral stent was also discussed with the possibility of stent symptoms.  All questions were answered and he desires to proceed.  3.  Will tentatively add onto OR schedule for tomorrow.  4.  Contacted his daughter Hendricks Milo and she was agreeable to this plan.   03/23/2021, 11:29 AM  John Giovanni,  MD

## 2021-03-23 NOTE — Progress Notes (Signed)
PROGRESS NOTE    David Henry  CZY:[id number] DOB: Feb 24, 1926 DOA: 03/22/2021 PCP: Abner Greenspan, MD   Brief Narrative: David Henry is a 87 y.o. male with a history of hypertension, CAD status post PCI, mitral sufficiency, hyperlipidemia, CKD stage IIIb, bilateral carotid artery stenosis, diabetes mellitus type 2, hypothyroidism, peripheral vascular disease, BPH.  Patient presented secondary to severe bilateral flank pain and found to have ureteral calculus with associated hydronephrosis and hydroureter.  Urology consulted.   Assessment & Plan:   Principal Problem:   Nephrolithiasis Active Problems:   Controlled type 2 diabetes mellitus with chronic kidney disease (HCC)   Hyperlipidemia associated with type 2 diabetes mellitus ([place])   Essential hypertension   Coronary atherosclerosis   Hypothyroid   Chronic kidney disease (CKD)   AKI (acute kidney injury) ([place])  Nephrolithiasis Ureteral calculus Recurrent issue. Associated hydronephrosis/hydroureter and mild AKI on CKD. Urology consulted. -Urology recommendations: trial of passage today, and if remains unchanged, plan for surgical management on 4/3. ~2-3% perioperative risk. Patient appears optimized. Most recent myocardial perfusion study with normal myocardial perfusion. Patient is on room air.  Dysuria Urinalysis with WBC and some bacteria. RBCs in setting of renal stone. -Start Ceftriaxone IV -Follow-up urine culture  AKI on CKD stage IIIb AKI is in setting of above issues and possibly secondary to above issues. Baseline creatinine of bout 1.7. Creatinine of 2.05 on admission and is stable. -Management of above problems -BMP in AM  Intractable nausea/vomiting Likely related to renal colic in setting of above. -Continue Zofran prn  Diabetes mellitus, type 2 Patient is on Lantus, Januvia, glimepiride as an outpatient. Last hemoglobin A1C of 6.8% from 01/10/21. -Continue Lantus -SSI q4 hours while not taking  adequate PO  Hypothyroidism -Continue Synthroid 50 mcg daily  Hyperlipidemia CAD s/p PCI -Continue Crestor  Primary hypertension Patient is on lisinopril as an outpatient. Lisinopril held secondary to mild AKI -hydralazine prn  Constipation -Miralax BID -Colace BID   DVT prophylaxis: Heparin Code Status:   Code Status: Full Code Family Communication: None at bedside. Patient declined for me to update family Disposition Plan: Discharge home likely in 2-4 days pending improvement of AKI, urology recommendations, ability to tolerate PO intake   Consultants:   Urology  Procedures:   None  Antimicrobials:  Ceftriaxone IV    Subjective: Nausea this morning which improved with Zofran. No concerns at this time. No bowel movement in almost one week  Objective: Vitals:   03/23/21 0000 03/23/21 0104 03/23/21 0414 03/23/21 0847  BP: (!) 145/80 (!) 173/71 132/64 (!) 153/66  Pulse: 84 83 90 88  Resp:  20 16 20   Temp:  98.3 F (36.8 C) 97.6 F (36.4 C) 98.9 F (37.2 C)  TempSrc:  Oral Oral Oral  SpO2: 96% 98% 97% 97%  Weight:  72 kg    Height:  5\' 8"  (1.727 m)      Intake/Output Summary (Last 24 hours) at 03/23/2021 1152 Last data filed at 03/23/2021 0525 Gross per 24 hour  Intake 80 ml  Output 650 ml  Net -570 ml   Filed Weights   03/22/21 1411 03/23/21 0104  Weight: 70.3 kg 72 kg    Examination:  General exam: Appears calm and comfortable Respiratory system: Clear to auscultation. Respiratory effort normal. Cardiovascular system: S1 & S2 heard, RRR. No murmurs, rubs, gallops or clicks. Gastrointestinal system: Abdomen is distended, soft and nontender. No organomegaly or masses felt. Normal bowel sounds heard. Central nervous system: Alert  and oriented. No focal neurological deficits. Musculoskeletal: No edema. No calf tenderness Skin: No cyanosis. No rashes Psychiatry: Judgement and insight appear normal. Mood & affect appropriate.     Data Reviewed: I  have personally reviewed following labs and imaging studies  CBC Lab Results  Component Value Date   WBC 12.1 (H) 03/23/2021   RBC 3.46 (L) 03/23/2021   HGB 11.6 (L) 03/23/2021   HCT 35.0 (L) 03/23/2021   MCV 101.2 (H) 03/23/2021   MCH 33.5 03/23/2021   PLT 176 03/23/2021   MCHC 33.1 03/23/2021   RDW 13.5 03/23/2021   LYMPHSABS 1.7 10/05/2020   MONOABS 0.8 10/05/2020   EOSABS 0.3 10/05/2020   BASOSABS 0.1 32/35/5732     Last metabolic panel Lab Results  Component Value Date   NA 137 03/23/2021   K 4.6 03/23/2021   CL 108 03/23/2021   CO2 23 03/23/2021   BUN 29 (H) 03/23/2021   CREATININE 2.05 (H) 03/23/2021   CREATININE 2.06 (H) 03/23/2021   GLUCOSE 148 (H) 03/23/2021   GFRNONAA 30 (L) 03/23/2021   GFRNONAA 30 (L) 03/23/2021   GFRAA 36 (L) 07/29/2018   CALCIUM 9.0 03/23/2021   PHOS 3.6 01/18/2018   PROT 7.1 03/22/2021   ALBUMIN 4.2 03/22/2021   BILITOT 1.3 (H) 03/22/2021   ALKPHOS 78 03/22/2021   AST 25 03/22/2021   ALT 26 03/22/2021   ANIONGAP 6 03/23/2021    CBG (last 3)  Recent Labs    03/22/21 2330 03/23/21 1017  GLUCAP 196* 242*     GFR: Estimated Creatinine Clearance: 22.2 mL/min (A) (by C-G formula based on SCr of 2.05 mg/dL (H)).  Coagulation Profile: No results for input(s): INR, PROTIME in the last 168 hours.  Recent Results (from the past 240 hour(s))  Resp Panel by RT-PCR (Flu A&B, Covid) Nasopharyngeal Swab     Status: None   Collection Time: 03/22/21 10:55 PM   Specimen: Nasopharyngeal Swab; Nasopharyngeal(NP) swabs in vial transport medium  Result Value Ref Range Status   SARS Coronavirus 2 by RT PCR NEGATIVE NEGATIVE Final    Comment: (NOTE) SARS-CoV-2 target nucleic acids are NOT DETECTED.  The SARS-CoV-2 RNA is generally detectable in upper respiratory specimens during the acute phase of infection. The lowest concentration of SARS-CoV-2 viral copies this assay can detect is 138 copies/mL. A negative result does not preclude  SARS-Cov-2 infection and should not be used as the sole basis for treatment or other patient management decisions. A negative result may occur with  improper specimen collection/handling, submission of specimen other than nasopharyngeal swab, presence of viral mutation(s) within the areas targeted by this assay, and inadequate number of viral copies(<138 copies/mL). A negative result must be combined with clinical observations, patient history, and epidemiological information. The expected result is Negative.  Fact Sheet for Patients:  EntrepreneurPulse.com.au  Fact Sheet for Healthcare Providers:  IncredibleEmployment.be  This test is no t yet approved or cleared by the [place] FDA and  has been authorized for detection and/or diagnosis of SARS-CoV-2 by FDA under an Emergency Use Authorization (EUA). This EUA will remain  in effect (meaning this test can be used) for the duration of the COVID-19 declaration under Section 564(b)(1) of the Act, 21 U.S.C.section 360bbb-3(b)(1), unless the authorization is terminated  or revoked sooner.       Influenza A by PCR NEGATIVE NEGATIVE Final   Influenza B by PCR NEGATIVE NEGATIVE Final    Comment: (NOTE) The Xpert Xpress SARS-CoV-2/FLU/RSV plus assay is intended  as an aid in the diagnosis of influenza from Nasopharyngeal swab specimens and should not be used as a sole basis for treatment. Nasal washings and aspirates are unacceptable for Xpert Xpress SARS-CoV-2/FLU/RSV testing.  Fact Sheet for Patients: EntrepreneurPulse.com.au  Fact Sheet for Healthcare Providers: IncredibleEmployment.be  This test is not yet approved or cleared by the [place] FDA and has been authorized for detection and/or diagnosis of SARS-CoV-2 by FDA under an Emergency Use Authorization (EUA). This EUA will remain in effect (meaning this test can be used) for the duration of  the COVID-19 declaration under Section 564(b)(1) of the Act, 21 U.S.C. section 360bbb-3(b)(1), unless the authorization is terminated or revoked.  Performed at [facility], [address], [place], [place] [address]         Radiology Studies: CT Renal Stone Study  Result Date: 03/22/2021 CLINICAL DATA:  Hematuria flank pain EXAM: CT ABDOMEN AND PELVIS WITHOUT CONTRAST TECHNIQUE: Multidetector CT imaging of the abdomen and pelvis was performed following the standard protocol without IV contrast. COMPARISON:  CT 06/10/2013 FINDINGS: Lower chest: Lung bases demonstrate no acute consolidation or effusion. Mild subpleural fibrosis on the right. Borderline cardiac size. Small pericardial effusion. Small hiatal hernia. Hepatobiliary: No focal liver abnormality is seen. Status post cholecystectomy. No biliary dilatation. Pancreas: Unremarkable. No pancreatic ductal dilatation or surrounding inflammatory changes. Fatty atrophy Spleen: Normal in size without focal abnormality. Adrenals/Urinary Tract: Adrenal glands are normal. 7.6 cm cyst exophytic to the upper pole left kidney. Subcentimeter cortical hyperdensity mid right kidney without change and likely representing hemorrhagic or proteinaceous cyst. Bilateral parapelvic cysts. Mild to moderate hydronephrosis on the left with hydroureter. This is secondary to a 3 mm stone in the distal left ureter about a cm proximal to the left UVJ. Bladder is nearly empty Stomach/Bowel: Stomach is within normal limits. Status post appendectomy. Extensive sigmoid colon diverticular disease without acute inflammatory change. No evidence of bowel wall thickening, distention, or inflammatory changes. Vascular/Lymphatic: Nonaneurysmal aorta. Moderate aortic atherosclerosis. No suspicious nodes. Reproductive: Enlarged prostate gland Other: Negative for free air or free fluid. Small fat containing left inguinal hernia. Musculoskeletal: Degenerative changes of the  spine. No acute osseous abnormality IMPRESSION: 1. Mild to moderate left hydronephrosis and hydroureter secondary to a 3 mm stone in the distal left ureter about a cm proximal to the left UVJ. 2. Extensive sigmoid colon diverticular disease without acute inflammatory change. 3. Multiple bilateral renal cysts including parapelvic cysts. Aortic Atherosclerosis (ICD10-I70.0). Electronically Signed   By: Donavan Foil M.D.   On: 03/22/2021 16:45        Scheduled Meds: . aspirin EC  81 mg Oral Daily  . heparin  5,000 Units Subcutaneous Q8H  . insulin aspart  0-9 Units Subcutaneous Q4H  . insulin glargine  15 Units Subcutaneous q morning  . levothyroxine  50 mcg Oral Q0600  . linagliptin  5 mg Oral Daily  . multivitamin with minerals  1 tablet Oral Daily  . NON FORMULARY   Oral Daily  . pantoprazole  40 mg Oral Daily  . rosuvastatin  5 mg Oral QODAY  . senna  1 tablet Oral BID  . sodium chloride flush  3 mL Intravenous Q12H   Continuous Infusions: . sodium chloride 75 mL/hr at 03/23/21 0130  . cefTRIAXone (ROCEPHIN)  IV 1 g (03/23/21 0957)     LOS: 0 days     Cordelia Poche, MD [facility] 03/23/2021, 11:52 AM  If 7PM-7AM, please contact night-coverage [email or web address]

## 2021-03-23 NOTE — Progress Notes (Signed)
MD notified patient has persistent vomiting despite Zofran.

## 2021-03-24 ENCOUNTER — Inpatient Hospital Stay: Payer: Medicare PPO | Admitting: Anesthesiology

## 2021-03-24 ENCOUNTER — Inpatient Hospital Stay: Payer: Medicare PPO

## 2021-03-24 ENCOUNTER — Encounter: Payer: Self-pay | Admitting: Internal Medicine

## 2021-03-24 ENCOUNTER — Encounter
Admission: EM | Disposition: A | Discharge status: Home / Self Care | Payer: Self-pay | Source: Home / Self Care | Attending: Family Medicine

## 2021-03-24 DIAGNOSIS — N201 Calculus of ureter: Secondary | ICD-10-CM

## 2021-03-24 HISTORY — PX: CYSTOSCOPY/URETEROSCOPY/HOLMIUM LASER/STENT PLACEMENT: SHX6546

## 2021-03-24 LAB — BASIC METABOLIC PANEL
Anion gap: 7 (ref 5–15)
BUN: 34 mg/dL — ABNORMAL HIGH (ref 8–23)
CO2: 23 mmol/L (ref 22–32)
Calcium: 9.1 mg/dL (ref 8.9–10.3)
Chloride: 109 mmol/L (ref 98–111)
Creatinine, Ser: 2.77 mg/dL — ABNORMAL HIGH (ref 0.61–1.24)
GFR, Estimated: 21 mL/min — ABNORMAL LOW (ref >60)
Glucose, Bld: 120 mg/dL — ABNORMAL HIGH (ref 70–99)
Potassium: 4.7 mmol/L (ref 3.5–5.1)
Sodium: 139 mmol/L (ref 135–145)

## 2021-03-24 LAB — CBC
HCT: 31.4 % — ABNORMAL LOW (ref 39.0–52.0)
Hemoglobin: 10.6 g/dL — ABNORMAL LOW (ref 13.0–17.0)
MCH: 34.5 pg — ABNORMAL HIGH (ref 26.0–34.0)
MCHC: 33.8 g/dL (ref 30.0–36.0)
MCV: 102.3 fL — ABNORMAL HIGH (ref 80.0–100.0)
Platelets: 161 10*3/uL (ref 150–400)
RBC: 3.07 MIL/uL — ABNORMAL LOW (ref 4.22–5.81)
RDW: 13.7 % (ref 11.5–15.5)
WBC: 16.4 10*3/uL — ABNORMAL HIGH (ref 4.0–10.5)
nRBC: 0 % (ref 0.0–0.2)

## 2021-03-24 LAB — MRSA PCR SCREENING: MRSA by PCR: NEGATIVE

## 2021-03-24 LAB — URINE CULTURE: Culture: NO GROWTH

## 2021-03-24 LAB — GLUCOSE, CAPILLARY
Glucose-Capillary: 108 mg/dL — ABNORMAL HIGH (ref 70–99)
Glucose-Capillary: 121 mg/dL — ABNORMAL HIGH (ref 70–99)
Glucose-Capillary: 121 mg/dL — ABNORMAL HIGH (ref 70–99)
Glucose-Capillary: 140 mg/dL — ABNORMAL HIGH (ref 70–99)
Glucose-Capillary: 239 mg/dL — ABNORMAL HIGH (ref 70–99)
Glucose-Capillary: 229 mg/dL — ABNORMAL HIGH (ref 70–99)

## 2021-03-24 SURGERY — CYSTOSCOPY/URETEROSCOPY/HOLMIUM LASER/STENT PLACEMENT
Anesthesia: General | Site: Ureter | Laterality: Left

## 2021-03-24 MED ORDER — SUCCINYLCHOLINE CHLORIDE 20 MG/ML IJ SOLN
INTRAMUSCULAR | Status: DC | PRN
  Administered 2021-03-24: 100 mg via INTRAVENOUS

## 2021-03-24 MED ORDER — SORBITOL 70 % SOLN
960.0000 mL | TOPICAL_OIL | Freq: Once | ORAL | Status: AC
  Administered 2021-03-24: 960 mL via RECTAL
  Filled 2021-03-24: qty 473

## 2021-03-24 MED ORDER — LACTATED RINGERS IV SOLN: INTRAVENOUS | Status: DC | PRN

## 2021-03-24 MED ORDER — ACETAMINOPHEN 10 MG/ML IV SOLN
INTRAVENOUS | Status: DC | PRN
  Administered 2021-03-24: 1000 mg via INTRAVENOUS

## 2021-03-24 MED ORDER — ACETAMINOPHEN 10 MG/ML IV SOLN
INTRAVENOUS | Status: AC
  Filled 2021-03-24: qty 100

## 2021-03-24 MED ORDER — DEXAMETHASONE SODIUM PHOSPHATE 10 MG/ML IJ SOLN
INTRAMUSCULAR | Status: AC
  Filled 2021-03-24: qty 1

## 2021-03-24 MED ORDER — DEXAMETHASONE SODIUM PHOSPHATE 10 MG/ML IJ SOLN
INTRAMUSCULAR | Status: DC | PRN
  Administered 2021-03-24: 5 mg via INTRAVENOUS

## 2021-03-24 MED ORDER — PROPOFOL 10 MG/ML IV BOLUS
INTRAVENOUS | Status: AC
  Filled 2021-03-24: qty 20

## 2021-03-24 MED ORDER — DEXMEDETOMIDINE (PRECEDEX) IN NS 20 MCG/5ML (4 MCG/ML) IV SYRINGE
PREFILLED_SYRINGE | INTRAVENOUS | Status: DC | PRN
  Administered 2021-03-24: 4 ug via INTRAVENOUS

## 2021-03-24 MED ORDER — ONDANSETRON HCL 4 MG/2ML IJ SOLN
INTRAMUSCULAR | Status: AC
  Filled 2021-03-24: qty 2

## 2021-03-24 MED ORDER — DEXMEDETOMIDINE (PRECEDEX) IN NS 20 MCG/5ML (4 MCG/ML) IV SYRINGE
PREFILLED_SYRINGE | INTRAVENOUS | Status: AC
  Filled 2021-03-24: qty 5

## 2021-03-24 MED ORDER — FENTANYL CITRATE (PF) 100 MCG/2ML IJ SOLN
INTRAMUSCULAR | Status: AC
  Filled 2021-03-24: qty 2

## 2021-03-24 MED ORDER — ONDANSETRON HCL 4 MG/2ML IJ SOLN
INTRAMUSCULAR | Status: DC | PRN
  Administered 2021-03-24: 4 mg via INTRAVENOUS

## 2021-03-24 MED ORDER — IOPAMIDOL (ISOVUE-200) INJECTION 41%
INTRAVENOUS | Status: DC | PRN
  Administered 2021-03-24: 12 mL

## 2021-03-24 MED ORDER — LIDOCAINE HCL (CARDIAC) PF 100 MG/5ML IV SOSY
PREFILLED_SYRINGE | INTRAVENOUS | Status: DC | PRN
  Administered 2021-03-24: 60 mg via INTRAVENOUS

## 2021-03-24 MED ORDER — EPHEDRINE SULFATE 50 MG/ML IJ SOLN
INTRAMUSCULAR | Status: DC | PRN
  Administered 2021-03-24: 5 mg via INTRAVENOUS

## 2021-03-24 MED ORDER — ONDANSETRON HCL 4 MG/2ML IJ SOLN: 4.0000 mg | Freq: Once | INTRAMUSCULAR | Status: DC | PRN

## 2021-03-24 MED ORDER — FENTANYL CITRATE (PF) 100 MCG/2ML IJ SOLN: 25.0000 ug | INTRAMUSCULAR | Status: DC | PRN

## 2021-03-24 MED ORDER — EPHEDRINE 5 MG/ML INJ
INTRAVENOUS | Status: AC
  Filled 2021-03-24: qty 10

## 2021-03-24 MED ORDER — PROPOFOL 10 MG/ML IV BOLUS
INTRAVENOUS | Status: DC | PRN
  Administered 2021-03-24: 30 mg via INTRAVENOUS
  Administered 2021-03-24: 70 mg via INTRAVENOUS

## 2021-03-24 MED ORDER — FENTANYL CITRATE (PF) 100 MCG/2ML IJ SOLN
INTRAMUSCULAR | Status: DC | PRN
  Administered 2021-03-24 (×3): 25 ug via INTRAVENOUS

## 2021-03-24 SURGICAL SUPPLY — 27 items
BAG DRAIN CYSTO-URO LG1000N (MISCELLANEOUS) ×2 IMPLANT
BASKET ZERO TIP 1.9FR (BASKET) ×2 IMPLANT
BRUSH SCRUB EZ 1% IODOPHOR (MISCELLANEOUS) ×2 IMPLANT
CATH URETL 5X70 OPEN END (CATHETERS) IMPLANT
CNTNR SPEC 2.5X3XGRAD LEK (MISCELLANEOUS)
CONT SPEC 4OZ STER OR WHT (MISCELLANEOUS)
CONT SPEC 4OZ STRL OR WHT (MISCELLANEOUS)
CONTAINER SPEC 2.5X3XGRAD LEK (MISCELLANEOUS) IMPLANT
DRAPE UTILITY 15X26 TOWEL STRL (DRAPES) ×2 IMPLANT
GLIDEWIRE STR 0.035 150CM 3CM (WIRE) ×2 IMPLANT
GLOVE SURG UNDER POLY LF SZ7.5 (GLOVE) ×2 IMPLANT
GOWN STRL REUS W/ TWL LRG LVL3 (GOWN DISPOSABLE) ×1 IMPLANT
GOWN STRL REUS W/ TWL XL LVL3 (GOWN DISPOSABLE) ×1 IMPLANT
GOWN STRL REUS W/TWL LRG LVL3 (GOWN DISPOSABLE) ×2
GOWN STRL REUS W/TWL XL LVL3 (GOWN DISPOSABLE) ×2
GUIDEWIRE STR DUAL SENSOR (WIRE) ×2 IMPLANT
INFUSOR MANOMETER BAG 3000ML (MISCELLANEOUS) ×2 IMPLANT
IV NS IRRIG 3000ML ARTHROMATIC (IV SOLUTION) ×2 IMPLANT
KIT TURNOVER CYSTO (KITS) ×2 IMPLANT
MANIFOLD NEPTUNE II (INSTRUMENTS) ×2 IMPLANT
PACK CYSTO AR (MISCELLANEOUS) ×2 IMPLANT
SET CYSTO W/LG BORE CLAMP LF (SET/KITS/TRAYS/PACK) ×2 IMPLANT
SHEATH URETERAL 12FRX35CM (MISCELLANEOUS) IMPLANT
STENT URET 6FRX24 CONTOUR (STENTS) ×2 IMPLANT
SURGILUBE 2OZ TUBE FLIPTOP (MISCELLANEOUS) ×2 IMPLANT
TRACTIP FLEXIVA PULSE ID 200 (Laser) ×2 IMPLANT
WATER STERILE IRR 1000ML POUR (IV SOLUTION) ×2 IMPLANT

## 2021-03-24 NOTE — Anesthesia Procedure Notes (Signed)
Procedure Name: Intubation Date/Time: 03/24/2021 9:29 AM Performed by: Jerrye Noble, CRNA Pre-anesthesia Checklist: Patient identified, Emergency Drugs available, Suction available and Patient being monitored Patient Re-evaluated:Patient Re-evaluated prior to induction Oxygen Delivery Method: Circle system utilized Preoxygenation: Pre-oxygenation with 100% oxygen Induction Type: IV induction and Rapid sequence Laryngoscope Size: McGraph and 3 Grade View: Grade I Tube type: Oral Tube size: 7.0 mm Number of attempts: 1 Airway Equipment and Method: Stylet,  Oral airway and Video-laryngoscopy Placement Confirmation: ETT inserted through vocal cords under direct vision,  positive ETCO2 and breath sounds checked- equal and bilateral Secured at: 22 cm Tube secured with: Tape Dental Injury: Teeth and Oropharynx as per pre-operative assessment

## 2021-03-24 NOTE — Anesthesia Preprocedure Evaluation (Addendum)
Anesthesia Evaluation  Patient identified by MRN, date of birth, ID band Patient awake    Reviewed: Allergy & Precautions, NPO status , Patient's Chart, lab work & pertinent test results  History of Anesthesia Complications Negative for: history of anesthetic complications  Airway Mallampati: III       Dental   Pulmonary shortness of breath and with exertion, neg sleep apnea, neg COPD, Not current smoker,    Pulmonary exam normal        Cardiovascular hypertension, Pt. on medications + CAD, + Past MI, + Cardiac Stents and + Peripheral Vascular Disease  (-) CHF Normal cardiovascular exam(-) dysrhythmias (-) Valvular Problems/Murmurs     Neuro/Psych neg Seizures negative neurological ROS  negative psych ROS   GI/Hepatic Neg liver ROS, GERD  Medicated and Controlled,  Endo/Other  diabetes, Type 2, Oral Hypoglycemic Agents, Insulin DependentHypothyroidism   Renal/GU Renal InsufficiencyRenal disease     Musculoskeletal  (+) Arthritis ,   Abdominal Normal abdominal exam  (+)   Peds negative pediatric ROS (+)  Hematology  (+) anemia ,   Anesthesia Other Findings Echo from 01/2020 :  EF 55% with moderate MR, and TI with no valvular stenosis.  Reproductive/Obstetrics                            Anesthesia Physical  Anesthesia Plan  ASA: III  Anesthesia Plan: General   Post-op Pain Management:    Induction: Intravenous  PONV Risk Score and Plan: 2 and Dexamethasone and Ondansetron  Airway Management Planned: Oral ETT  Additional Equipment:   Intra-op Plan:   Post-operative Plan: Extubation in OR  Informed Consent: I have reviewed the patients History and Physical, chart, labs and discussed the procedure including the risks, benefits and alternatives for the proposed anesthesia with the patient or authorized representative who has indicated his/her understanding and acceptance.      Dental advisory given  Plan Discussed with: CRNA and Surgeon  Anesthesia Plan Comments:         Anesthesia Quick Evaluation

## 2021-03-24 NOTE — Transfer of Care (Signed)
Immediate Anesthesia Transfer of Care Note  Patient: David Henry  Procedure(s) Performed: CYSTOSCOPY/URETEROSCOPY, RETROGRADE AND /STENT PLACEMENT (Left Ureter)  Patient Location: PACU  Anesthesia Type:General  Level of Consciousness: drowsy and patient cooperative  Airway & Oxygen Therapy: Patient Spontanous Breathing and Patient connected to face mask oxygen  Post-op Assessment: Report given to RN and Post -op Vital signs reviewed and stable  Post vital signs: Reviewed and stable  Last Vitals:  Vitals Value Taken Time  BP 115/48 03/24/21 1020  Temp    Pulse 74 03/24/21 1023  Resp 14 03/24/21 1023  SpO2 100 % 03/24/21 1023  Vitals shown include unvalidated device data.  Last Pain:  Vitals:   03/24/21 0735  TempSrc: Oral  PainSc: 5       Patients Stated Pain Goal: 0 (29/52/84 1324)  Complications: No complications documented.

## 2021-03-24 NOTE — Op Note (Signed)
Preoperative diagnosis: Left distal ureteral calculus  Postoperative diagnosis: Left distal ureteral calculus  Procedure:  1. Cystoscopy 2. Left ureteroscopy with stone removal 3. Left ureteral stent placement (6FR/24 cm) 4. Left retrograde pyelography with interpretation  Surgeon: Nicki Reaper C. Marilyn Wing, M.D.  Anesthesia: General  Complications: None  Intraoperative findings:  1.  Cystoscopy-bulbar urethral stricture which the 19 French scope was able to negotiate; mild lateral lobe enlargement with moderate bladder neck elevation; bladder mucosa without erythema, solid or papillary lesions; mild trabeculation 2.  Ureteroscopy-moderate inflammatory changes distal ureter with calculus just proximal to this area 3.  Retrograde pyelography post procedure showed no filling defects or contrast extravasation  EBL: Minimal  Specimens: 1. Calculus for analysis   Indication: David Henry is a 87 y.o. who presented to the ED 03/22/2021 with nausea/vomiting and left flank pain.  Stone protocol CT with a 3 mm left distal ureteral calculus with mild hydronephrosis.  He required admission for intractable nausea/vomiting.  Symptoms have persisted with inability to pass the stone and ureteroscopic removal was recommended.  After reviewing the management options for treatment, the patient elected to proceed with the above surgical procedure(s). We have discussed the potential benefits and risks of the procedure, side effects of the proposed treatment, the likelihood of the patient achieving the goals of the procedure, and any potential problems that might occur during the procedure or recuperation. Informed consent has been obtained.  Description of procedure:  The patient was taken to the operating room and general anesthesia was induced.  The patient was placed in the dorsal lithotomy position, prepped and draped in the usual sterile fashion, and preoperative antibiotics were administered. A  preoperative time-out was performed.   A 21 French cystoscope was lubricated, passed per urethra and advanced proximally under direct vision with findings as described above.  Attention was directed to the left ureteral orifice and a 0.038 Sensor wire met resistance within 1 cm of the distal ureter.  A 0.038 Glidewire was also unsuccessful.  A 4.5 Fr semirigid ureteroscope was then advanced into the ureter and the Glidewire was able to be advanced into the renal pelvis.  The ureteroscope was removed and repassed alongside the guidewire.  The stone was identified in the distal ureter with findings as described above.  The calculus was placed in a 1.9 French nitinol basket and removed without difficulty.  The ureteroscope was repassed and retrograde pyelogram was performed with findings as described above.  The ureteroscope was removed and a 6FR/24 cm Contour ureteral stent was advanced over the wire under fluoroscopic guidance.  The renal pelvis was elongated and narrow and only a partial curl was obtained.  A good curl was noted in the bladder.  After anesthetic reversal patient was transported to the PACU in stable condition.  Recommendation:  If pain and nausea/vomiting improved okay for discharge later today or in a.m.  Cystoscopy with stent removal in office ~ 1 week   John Giovanni, MD

## 2021-03-24 NOTE — Progress Notes (Signed)
PROGRESS NOTE    David Henry  JSE:[id number] DOB: 08/11/1926 DOA: 03/22/2021 PCP: Abner Greenspan, MD   Brief Narrative: David Henry is a 87 y.o. male with a history of hypertension, CAD status post PCI, mitral sufficiency, hyperlipidemia, CKD stage IIIb, bilateral carotid artery stenosis, diabetes mellitus type 2, hypothyroidism, peripheral vascular disease, BPH.  Patient presented secondary to severe bilateral flank pain and found to have ureteral calculus with associated hydronephrosis and hydroureter.  Urology consulted.   Assessment & Plan:  Principal Problem:   Nephrolithiasis Active Problems:   Controlled type 2 diabetes mellitus with chronic kidney disease (HCC)   Hyperlipidemia associated with type 2 diabetes mellitus ([place])   Essential hypertension   Coronary atherosclerosis   Hypothyroid   Chronic kidney disease (CKD)   AKI (acute kidney injury) ([place])  Nephrolithiasis Ureteral calculus Recurrent issue. Associated hydronephrosis/hydroureter and mild AKI on CKD. Urology consulted. -Urology recommendations: stone removal today  Dysuria Urinalysis with WBC and some bacteria. RBCs in setting of renal stone. Urine culture with no growth. -Continue Ceftriaxone IV  AKI on CKD stage IIIb AKI is in setting of above issues and possibly secondary to above issues. Baseline creatinine of bout 1.7. Creatinine of 2.05 on admission and acutely worsened to 2.77 this morning. -Management of above problems -BMP in AM  Intractable nausea/vomiting Likely related to renal colic in setting of above. -Continue Zofran prn  Diabetes mellitus, type 2 Patient is on Lantus, Januvia, glimepiride as an outpatient. Last hemoglobin A1C of 6.8% from 01/10/21. -Continue Lantus -SSI q4 hours while not taking adequate PO  Hypothyroidism -Continue Synthroid 50 mcg daily  Hyperlipidemia CAD s/p PCI -Continue Crestor  Primary hypertension Patient is on lisinopril as an outpatient.  Lisinopril held secondary to mild AKI -hydralazine prn  Constipation -Miralax BID -Colace BID   DVT prophylaxis: Heparin Code Status:   Code Status: Full Code Family Communication: None at bedside. Patient declined for me to update family Disposition Plan: Discharge home likely in 2-4 days pending improvement of AKI, urology recommendations, ability to tolerate PO intake   Consultants:   Urology  Procedures:   None  Antimicrobials:  Ceftriaxone IV    Subjective: No issues overnight.  Objective: Vitals:   03/24/21 0500 03/24/21 0735 03/24/21 1023 03/24/21 1038  BP:  (!) 140/58 (!) 115/48 (!) 124/42  Pulse:  72 74 (!) 58  Resp:  17 14 11   Temp:  98.5 F (36.9 C) 98.4 F (36.9 C)   TempSrc:  Oral Temporal   SpO2:  95% 100% 100%  Weight: 70.8 kg     Height:        Intake/Output Summary (Last 24 hours) at 03/24/2021 1050 Last data filed at 03/24/2021 1011 Gross per 24 hour  Intake 302.74 ml  Output 302 ml  Net 0.74 ml   Filed Weights   03/22/21 1411 03/23/21 0104 03/24/21 0500  Weight: 70.3 kg 72 kg 70.8 kg    Examination:  General exam: Appears calm and comfortable Respiratory system: Clear to auscultation. Respiratory effort normal. Cardiovascular system: S1 & S2 heard, RRR. No murmurs, rubs, gallops or clicks. Gastrointestinal system: Abdomen is distended, soft and nontender. No organomegaly or masses felt. Normal bowel sounds heard. Central nervous system: Alert and oriented. No focal neurological deficits. Musculoskeletal: No edema. No calf tenderness Skin: No cyanosis. No rashes Psychiatry: Judgement and insight appear normal. Mood & affect appropriate.     Data Reviewed: I have personally reviewed following labs and imaging studies  CBC Lab Results  Component Value Date   WBC 16.4 (H) 03/24/2021   RBC 3.07 (L) 03/24/2021   HGB 10.6 (L) 03/24/2021   HCT 31.4 (L) 03/24/2021   MCV 102.3 (H) 03/24/2021   MCH 34.5 (H) 03/24/2021   PLT 161  03/24/2021   MCHC 33.8 03/24/2021   RDW 13.7 03/24/2021   LYMPHSABS 1.7 10/05/2020   MONOABS 0.8 10/05/2020   EOSABS 0.3 10/05/2020   BASOSABS 0.1 69/62/9528     Last metabolic panel Lab Results  Component Value Date   NA 139 03/24/2021   K 4.7 03/24/2021   CL 109 03/24/2021   CO2 23 03/24/2021   BUN 34 (H) 03/24/2021   CREATININE 2.77 (H) 03/24/2021   GLUCOSE 120 (H) 03/24/2021   GFRNONAA 21 (L) 03/24/2021   GFRAA 36 (L) 07/29/2018   CALCIUM 9.1 03/24/2021   PHOS 3.6 01/18/2018   PROT 7.1 03/22/2021   ALBUMIN 4.2 03/22/2021   BILITOT 1.3 (H) 03/22/2021   ALKPHOS 78 03/22/2021   AST 25 03/22/2021   ALT 26 03/22/2021   ANIONGAP 7 03/24/2021    CBG (last 3)  Recent Labs    03/24/21 0345 03/24/21 0723 03/24/21 1034  GLUCAP 108* 121* 121*     GFR: Estimated Creatinine Clearance: 16.5 mL/min (A) (by C-G formula based on SCr of 2.77 mg/dL (H)).  Coagulation Profile: No results for input(s): INR, PROTIME in the last 168 hours.  Recent Results (from the past 240 hour(s))  Urine culture     Status: None   Collection Time: 03/22/21  3:23 PM   Specimen: Urine, Random  Result Value Ref Range Status   Specimen Description   Final    URINE, RANDOM Performed at [facility], [address], [place], [place]  [address]    Special Requests   Final    NONE Performed at [facility], [address], [place], [place] [address]    Culture   Final    NO GROWTH Performed at [facility], [place] [address], [place], [place] [address]    Report Status 03/24/2021 FINAL  Final  Resp Panel by RT-PCR (Flu A&B, Covid) Nasopharyngeal Swab     Status: None   Collection Time: 03/22/21 10:55 PM   Specimen: Nasopharyngeal Swab; Nasopharyngeal(NP) swabs in vial transport medium  Result Value Ref Range Status   SARS Coronavirus 2 by RT PCR NEGATIVE NEGATIVE Final    Comment: (NOTE) SARS-CoV-2 target nucleic acids are NOT DETECTED.  The SARS-CoV-2 RNA is  generally detectable in upper respiratory specimens during the acute phase of infection. The lowest concentration of SARS-CoV-2 viral copies this assay can detect is 138 copies/mL. A negative result does not preclude SARS-Cov-2 infection and should not be used as the sole basis for treatment or other patient management decisions. A negative result may occur with  improper specimen collection/handling, submission of specimen other than nasopharyngeal swab, presence of viral mutation(s) within the areas targeted by this assay, and inadequate number of viral copies(<138 copies/mL). A negative result must be combined with clinical observations, patient history, and epidemiological information. The expected result is Negative.  Fact Sheet for Patients:  EntrepreneurPulse.com.au  Fact Sheet for Healthcare Providers:  IncredibleEmployment.be  This test is no t yet approved or cleared by the [place] FDA and  has been authorized for detection and/or diagnosis of SARS-CoV-2 by FDA under an Emergency Use Authorization (EUA). This EUA will remain  in effect (meaning this test can be used) for  the duration of the COVID-19 declaration under Section 564(b)(1) of the Act, 21 U.S.C.section 360bbb-3(b)(1), unless the authorization is terminated  or revoked sooner.       Influenza A by PCR NEGATIVE NEGATIVE Final   Influenza B by PCR NEGATIVE NEGATIVE Final    Comment: (NOTE) The Xpert Xpress SARS-CoV-2/FLU/RSV plus assay is intended as an aid in the diagnosis of influenza from Nasopharyngeal swab specimens and should not be used as a sole basis for treatment. Nasal washings and aspirates are unacceptable for Xpert Xpress SARS-CoV-2/FLU/RSV testing.  Fact Sheet for Patients: EntrepreneurPulse.com.au  Fact Sheet for Healthcare Providers: IncredibleEmployment.be  This test is not yet approved or cleared by the [place] FDA and has been authorized for detection and/or diagnosis of SARS-CoV-2 by FDA under an Emergency Use Authorization (EUA). This EUA will remain in effect (meaning this test can be used) for the duration of the COVID-19 declaration under Section 564(b)(1) of the Act, 21 U.S.C. section 360bbb-3(b)(1), unless the authorization is terminated or revoked.  Performed at [facility], [address] [place], [place] [address]   MRSA PCR Screening     Status: None   Collection Time: 03/24/21  3:20 AM   Specimen: Nasopharyngeal  Result Value Ref Range Status   MRSA by PCR NEGATIVE NEGATIVE Final    Comment:        The GeneXpert MRSA Assay (FDA approved for NASAL specimens only), is one component of a comprehensive MRSA colonization surveillance program. It is not intended to diagnose MRSA infection nor to guide or monitor treatment for MRSA infections. Performed at [facility], [address] [place], [place] [address]         Radiology Studies: DG OR UROLOGY CYSTO IMAGE ([place])  Result Date: 03/24/2021 There is no interpretation for this exam.  This order is for images obtained during a surgical procedure.  Please See "Surgeries" Tab for more information regarding the procedure.   CT Renal Stone Study  Result Date: 03/22/2021 CLINICAL DATA:  Hematuria flank pain EXAM: CT ABDOMEN AND PELVIS WITHOUT CONTRAST TECHNIQUE: Multidetector CT imaging of the abdomen and pelvis was performed following the standard protocol without IV contrast. COMPARISON:  CT 06/10/2013 FINDINGS: Lower chest: Lung bases demonstrate no acute consolidation or effusion. Mild subpleural fibrosis on the right. Borderline cardiac size. Small pericardial effusion. Small hiatal hernia. Hepatobiliary: No focal liver abnormality is seen. Status post cholecystectomy. No biliary dilatation. Pancreas: Unremarkable. No pancreatic ductal dilatation or surrounding inflammatory changes. Fatty  atrophy Spleen: Normal in size without focal abnormality. Adrenals/Urinary Tract: Adrenal glands are normal. 7.6 cm cyst exophytic to the upper pole left kidney. Subcentimeter cortical hyperdensity mid right kidney without change and likely representing hemorrhagic or proteinaceous cyst. Bilateral parapelvic cysts. Mild to moderate hydronephrosis on the left with hydroureter. This is secondary to a 3 mm stone in the distal left ureter about a cm proximal to the left UVJ. Bladder is nearly empty Stomach/Bowel: Stomach is within normal limits. Status post appendectomy. Extensive sigmoid colon diverticular disease without acute inflammatory change. No evidence of bowel wall thickening, distention, or inflammatory changes. Vascular/Lymphatic: Nonaneurysmal aorta. Moderate aortic atherosclerosis. No suspicious nodes. Reproductive: Enlarged prostate gland Other: Negative for free air or free fluid. Small fat containing left inguinal hernia. Musculoskeletal: Degenerative changes of the spine. No acute osseous abnormality IMPRESSION: 1. Mild to moderate left hydronephrosis and hydroureter secondary to a 3 mm stone in the distal left ureter about a cm proximal to the left UVJ.  2. Extensive sigmoid colon diverticular disease without acute inflammatory change. 3. Multiple bilateral renal cysts including parapelvic cysts. Aortic Atherosclerosis (ICD10-I70.0). Electronically Signed   By: Donavan Foil M.D.   On: 03/22/2021 16:45        Scheduled Meds: . [MAR Hold] aspirin EC  81 mg Oral Daily  . [MAR Hold] Chlorhexidine Gluconate Cloth  6 each Topical Q0600  . [MAR Hold] docusate sodium  100 mg Oral BID  . [MAR Hold] heparin  5,000 Units Subcutaneous Q8H  . [MAR Hold] insulin aspart  0-9 Units Subcutaneous Q4H  . [MAR Hold] insulin glargine  15 Units Subcutaneous q morning  . [MAR Hold] levothyroxine  50 mcg Oral Q0600  . [MAR Hold] linagliptin  5 mg Oral Daily  . [MAR Hold] multivitamin with minerals  1 tablet  Oral Daily  . [MAR Hold] NON FORMULARY   Oral Daily  . [MAR Hold] pantoprazole  40 mg Oral Daily  . [MAR Hold] polyethylene glycol  17 g Oral BID  . [MAR Hold] rosuvastatin  5 mg Oral QODAY  . [MAR Hold] senna  1 tablet Oral BID  . [MAR Hold] sodium chloride flush  3 mL Intravenous Q12H  . [MAR Hold] tamsulosin  0.4 mg Oral Daily   Continuous Infusions: . [MAR Hold] cefTRIAXone (ROCEPHIN)  IV 0 mL/hr at 03/23/21 1137     LOS: 1 day     Cordelia Poche, MD [facility] 03/24/2021, 10:50 AM  If 7PM-7AM, please contact night-coverage [email or web address]

## 2021-03-24 NOTE — Interval H&P Note (Signed)
History and Physical Interval Note: Nausea persists, denies stone passage.  Will proceed with ureteroscopy.  All questions answered  03/24/2021 9:30 AM  [name]  has presented today for surgery, with the diagnosis of ureteral stone.  The various methods of treatment have been discussed with the patient and family. After consideration of risks, benefits and other options for treatment, the patient has consented to  Procedure(s): CYSTOSCOPY/URETEROSCOPY/HOLMIUM LASER/STENT PLACEMENT (Left) as a surgical intervention.  The patient's history has been reviewed, patient examined, no change in status, stable for surgery.  I have reviewed the patient's chart and labs.  Questions were answered to the patient's satisfaction.     [place]

## 2021-03-24 NOTE — Progress Notes (Signed)
1745 patient notified RN pain 8/10 RLQ, only voiding small amounts of blood, and urge to urinate. Pain relieved when he lifts up on abdomen, described as soreness.  Onset sudden x30 minutes prior, progressively getting worse.  MD notified, PRN morphine given. Order for abd Xray.  1920 patient reported relief that lasted until 1900 but "I feel like it wore off." Oncoming RN aware.

## 2021-03-24 NOTE — Anesthesia Postprocedure Evaluation (Signed)
Anesthesia Post Note  Patient: David Henry  Procedure(s) Performed: CYSTOSCOPY/URETEROSCOPY, RETROGRADE AND /STENT PLACEMENT (Left Ureter)  Patient location during evaluation: PACU Anesthesia Type: General Level of consciousness: awake and alert and oriented Pain management: pain level controlled Vital Signs Assessment: post-procedure vital signs reviewed and stable Respiratory status: spontaneous breathing Cardiovascular status: blood pressure returned to baseline Anesthetic complications: no   No complications documented.   Last Vitals:  Vitals:   03/24/21 1241 03/24/21 1502  BP: (!) 156/52 (!) 144/56  Pulse: (!) 56 (!) 57  Resp: (!) 22 (!) 24  Temp: 36.7 C   SpO2: 96% 94%    Last Pain:  Vitals:   03/24/21 1445  TempSrc:   PainSc: 5                  [name]

## 2021-03-25 ENCOUNTER — Encounter: Payer: Self-pay | Admitting: Urology

## 2021-03-25 ENCOUNTER — Inpatient Hospital Stay: Payer: Medicare PPO

## 2021-03-25 LAB — CBC
HCT: 32.9 % — ABNORMAL LOW (ref 39.0–52.0)
Hemoglobin: 11.1 g/dL — ABNORMAL LOW (ref 13.0–17.0)
MCH: 34.4 pg — ABNORMAL HIGH (ref 26.0–34.0)
MCHC: 33.7 g/dL (ref 30.0–36.0)
MCV: 101.9 fL — ABNORMAL HIGH (ref 80.0–100.0)
Platelets: 184 10*3/uL (ref 150–400)
RBC: 3.23 MIL/uL — ABNORMAL LOW (ref 4.22–5.81)
RDW: 13.7 % (ref 11.5–15.5)
WBC: 21 10*3/uL — ABNORMAL HIGH (ref 4.0–10.5)
nRBC: 0 % (ref 0.0–0.2)

## 2021-03-25 LAB — GLUCOSE, CAPILLARY
Glucose-Capillary: 127 mg/dL — ABNORMAL HIGH (ref 70–99)
Glucose-Capillary: 144 mg/dL — ABNORMAL HIGH (ref 70–99)
Glucose-Capillary: 147 mg/dL — ABNORMAL HIGH (ref 70–99)
Glucose-Capillary: 205 mg/dL — ABNORMAL HIGH (ref 70–99)
Glucose-Capillary: 210 mg/dL — ABNORMAL HIGH (ref 70–99)
Glucose-Capillary: 242 mg/dL — ABNORMAL HIGH (ref 70–99)

## 2021-03-25 LAB — BASIC METABOLIC PANEL
Anion gap: 8 (ref 5–15)
BUN: 37 mg/dL — ABNORMAL HIGH (ref 8–23)
CO2: 21 mmol/L — ABNORMAL LOW (ref 22–32)
Calcium: 9.1 mg/dL (ref 8.9–10.3)
Chloride: 110 mmol/L (ref 98–111)
Creatinine, Ser: 2.37 mg/dL — ABNORMAL HIGH (ref 0.61–1.24)
GFR, Estimated: 25 mL/min — ABNORMAL LOW (ref >60)
Glucose, Bld: 151 mg/dL — ABNORMAL HIGH (ref 70–99)
Potassium: 4.3 mmol/L (ref 3.5–5.1)
Sodium: 139 mmol/L (ref 135–145)

## 2021-03-25 MED ORDER — CALCIUM CARBONATE ANTACID 500 MG PO CHEW: 1.0000 | CHEWABLE_TABLET | Freq: Three times a day (TID) | ORAL | Status: DC | PRN

## 2021-03-25 MED ORDER — CHLORHEXIDINE GLUCONATE CLOTH 2 % EX PADS
6.0000 | MEDICATED_PAD | Freq: Every day | CUTANEOUS | Status: DC
  Administered 2021-03-25 – 2021-03-27 (×3): 6 via TOPICAL

## 2021-03-25 MED ORDER — IOHEXOL 9 MG/ML PO SOLN
500.0000 mL | ORAL | Status: AC
  Administered 2021-03-25 (×2): 500 mL via ORAL

## 2021-03-25 MED ORDER — SODIUM CHLORIDE 0.9 % IV SOLN: INTRAVENOUS | Status: AC

## 2021-03-25 NOTE — Progress Notes (Signed)
Urology Inpatient Progress Note  Subjective: No acute events overnight.  Patient is afebrile and stably hypertensive. WBC count up today, 21.0.  Creatinine down today, 2.37.  Urine culture finalized negative. Patient is voiding red urine today without clots.  He reports midline abdominal pain without flank pain.  He feels he is straining to urinate and not emptying his bladder well.  He feels his abdomen is distended today.  Anti-infectives: Anti-infectives (From admission, onward)   Start     Dose/Rate Route Frequency Ordered Stop   03/23/21 0900  cefTRIAXone (ROCEPHIN) 1 g in sodium chloride 0.9 % 100 mL IVPB        1 g 200 mL/hr over 30 Minutes Intravenous Every 24 hours 03/23/21 0734        Current Facility-Administered Medications  Medication Dose Route Frequency Provider Last Rate Last Admin  . acetaminophen (TYLENOL) tablet 650 mg  650 mg Oral Q6H PRN Stoioff, Scott C, MD   650 mg at 03/24/21 1536  . albuterol (PROVENTIL) (2.5 MG/3ML) 0.083% nebulizer solution 2.5 mg  2.5 mg Nebulization Q2H PRN Stoioff, Scott C, MD      . aspirin EC tablet 81 mg  81 mg Oral Daily Stoioff, Scott C, MD   81 mg at 03/23/21 0948  . cefTRIAXone (ROCEPHIN) 1 g in sodium chloride 0.9 % 100 mL IVPB  1 g Intravenous Q24H Stoioff, Scott C, MD 0 mL/hr at 03/23/21 1137 1 g at 03/24/21 0937  . docusate sodium (COLACE) capsule 100 mg  100 mg Oral BID Stoioff, Scott C, MD   100 mg at 03/25/21 0030  . heparin injection 5,000 Units  5,000 Units Subcutaneous Q8H Stoioff, Scott C, MD   5,000 Units at 03/25/21 0536  . HYDROcodone-acetaminophen (NORCO/VICODIN) 5-325 MG per tablet 1-2 tablet  1-2 tablet Oral Q4H PRN [name], MD   2 tablet at 03/25/21 0813  . insulin aspart (novoLOG) injection 0-9 Units  0-9 Units Subcutaneous Q4H Stoioff, Ronda Fairly, MD   1 Units at 03/25/21 0425  . insulin glargine (LANTUS) injection 15 Units  15 Units Subcutaneous q morning [name], MD   15 Units at 03/23/21 0950  .  levothyroxine (SYNTHROID) tablet 50 mcg  50 mcg Oral Q0600 [name], MD   50 mcg at 03/25/21 0537  . linagliptin (TRADJENTA) tablet 5 mg  5 mg Oral Daily Stoioff, Scott C, MD   5 mg at 03/23/21 0949  . morphine 2 MG/ML injection 2 mg  2 mg Intravenous Q4H PRN Stoioff, Scott C, MD   2 mg at 03/25/21 0537  . multivitamin with minerals tablet 1 tablet  1 tablet Oral Daily [name], MD   1 tablet at 03/23/21 0948  . NON FORMULARY   Oral Daily Stoioff, Scott C, MD      . ondansetron (ZOFRAN) tablet 4 mg  4 mg Oral Q6H PRN Stoioff, Scott C, MD   4 mg at 03/23/21 1145   Or  . ondansetron (ZOFRAN) injection 4 mg  4 mg Intravenous Q6H PRN Stoioff, Scott C, MD   4 mg at 03/23/21 1858  . pantoprazole (PROTONIX) EC tablet 40 mg  40 mg Oral Daily Stoioff, Scott C, MD   40 mg at 03/23/21 0949  . polyethylene glycol (MIRALAX / GLYCOLAX) packet 17 g  17 g Oral BID Stoioff, Scott C, MD   17 g at 03/25/21 0030  . rosuvastatin (CRESTOR) tablet 5 mg  5 mg Oral QODAY Stoioff, Scott  C, MD   5 mg at 03/23/21 0949  . senna (SENOKOT) tablet 8.6 mg  1 tablet Oral BID Stoioff, Scott C, MD   8.6 mg at 03/23/21 0948  . sodium chloride flush (NS) 0.9 % injection 3 mL  3 mL Intravenous Q12H Stoioff, Scott C, MD   3 mL at 03/24/21 2100  . tamsulosin (FLOMAX) capsule 0.4 mg  0.4 mg Oral Daily Stoioff, Scott C, MD   0.4 mg at 03/23/21 1223   Objective: Vital signs in last 24 hours: Temp:  [98 F (36.7 C)-99 F (37.2 C)] 98.7 F (37.1 C) (04/04 0801) Pulse Rate:  [55-83] 83 (04/04 0801) Resp:  [11-24] 20 (04/04 0801) BP: (115-181)/(42-68) 181/68 (04/04 0801) SpO2:  [94 %-100 %] 95 % (04/04 0801) Weight:  [67.7 kg] 67.7 kg (04/04 0500)  Intake/Output from previous day: 04/03 0701 - 04/04 0700 In: 1320 [P.O.:1120; IV Piggyback:200] Out: 285 [Urine:285] Intake/Output this shift: No intake/output data recorded.  Physical Exam Vitals and nursing note reviewed.  Constitutional:      General: He is not in  acute distress.    Appearance: He is not ill-appearing, toxic-appearing or diaphoretic.  HENT:     Head: Normocephalic and atraumatic.  Pulmonary:     Effort: Pulmonary effort is normal. No respiratory distress.  Abdominal:     General: There is distension.  Skin:    General: Skin is warm and dry.  Neurological:     Mental Status: He is alert and oriented to person, place, and time.  Psychiatric:        Mood and Affect: Mood normal.        Behavior: Behavior normal.    Lab Results:  Recent Labs    03/24/21 0625 03/25/21 0445  WBC 16.4* 21.0*  HGB 10.6* 11.1*  HCT 31.4* 32.9*  PLT 161 184   BMET Recent Labs    03/24/21 0625 03/25/21 0445  NA 139 139  K 4.7 4.3  CL 109 110  CO2 23 21*  GLUCOSE 120* 151*  BUN 34* 37*  CREATININE 2.77* 2.37*  CALCIUM 9.1 9.1   Assessment & Plan: 87 year old male s/p left ureteroscopy with stone removal with Dr. Bernardo Heater for management of a 3 mm distal left ureteral stone associated with intractable nausea and vomiting.  Nausea and vomiting have resolved this morning with improvement in creatinine.  With patient reports of incomplete emptying, straining to urinate, midline abdominal pain, and abdominal distention, recommend bladder scan ASAP and placement of an 22 French coud Foley catheter if this is found to be elevated.  Notably, patient is already on Flomax; I do not recommend increasing his dose due to age and concern for increased fall risk.  Gross hematuria today likely a normal finding in the setting of ureteral stent.  I explained to the patient that he will require cystoscopy stent removal in office in approximately 1 week.  He expressed understanding.  Okay for discharge from the urologic perspective with or without catheter as above.  If he requires Foley placement, can plan for voiding trial at the time of cystoscopy stent removal.  Debroah Loop, PA-C 03/25/2021

## 2021-03-25 NOTE — Progress Notes (Signed)
Order given from [facility] PA to place a coude catheter

## 2021-03-25 NOTE — Progress Notes (Signed)
PROGRESS NOTE    David Henry  QPR:[id number] DOB: 1926-07-14 DOA: 03/22/2021 PCP: Abner Greenspan, MD   Brief Narrative: David Henry is a 87 y.o. male with a history of hypertension, CAD status post PCI, mitral sufficiency, hyperlipidemia, CKD stage IIIb, bilateral carotid artery stenosis, diabetes mellitus type 2, hypothyroidism, peripheral vascular disease, BPH.  Patient presented secondary to severe bilateral flank pain and found to have ureteral calculus with associated hydronephrosis and hydroureter.  Urology consulted.   Assessment & Plan:  Principal Problem:   Nephrolithiasis Active Problems:   Controlled type 2 diabetes mellitus with chronic kidney disease (HCC)   Hyperlipidemia associated with type 2 diabetes mellitus ([place])   Essential hypertension   Coronary atherosclerosis   Hypothyroid   Chronic kidney disease (CKD)   AKI (acute kidney injury) ([place])  Nephrolithiasis Ureteral calculus Recurrent issue. Associated hydronephrosis/hydroureter and mild AKI on CKD. Urology consulted and performed uteroscopy with stone removal/uretheral stent placement. -Urology recommendations: outpatient follow-up  Dysuria Urinalysis with WBC and some bacteria. RBCs in setting of renal stone. Urine culture with no growth. -Continue Ceftriaxone IV  AKI on CKD stage IIIb AKI is in setting of above issues and possibly secondary to above issues. Baseline creatinine of bout 1.7. Creatinine of 2.05 on admission and acutely worsened to a peak of 2.77. Improving since stent placement and stone removal.  Intractable nausea/vomiting Likely related to renal colic in setting of above. -Continue Zofran prn  Abdominal pain Abdominal distension Possibly related to constipation, however this was acutely worsening. Patient with worst pain he has ever felt and now has associated leukocytosis. Unknown if related. -CT abdomen/pelvis -Urology recommending bladder scan for possible urinary  retention  Diabetes mellitus, type 2 Patient is on Lantus, Januvia, glimepiride as an outpatient. Last hemoglobin A1C of 6.8% from 01/10/21. -Continue Lantus -SSI q4 hours while not taking adequate PO  Hypothyroidism -Continue Synthroid 50 mcg daily  Hyperlipidemia CAD s/p PCI -Continue Crestor  Primary hypertension Patient is on lisinopril as an outpatient. Lisinopril held secondary to mild AKI -hydralazine prn  Constipation -Miralax BID -Colace BID   DVT prophylaxis: Heparin Code Status:   Code Status: Full Code Family Communication: Daughter at bedside Disposition Plan: Discharge home likely in 1-4 days pending improvement of AKI, urology recommendations, ability to tolerate PO intake   Consultants:   Urology  Procedures:   None  Antimicrobials:  Ceftriaxone IV    Subjective: No issues overnight.  Objective: Vitals:   03/25/21 0000 03/25/21 0406 03/25/21 0500 03/25/21 0801  BP: (!) 173/61 (!) 178/68  (!) 181/68  Pulse: 71 78  83  Resp:  20  20  Temp: 98.7 F (37.1 C) 98.9 F (37.2 C)  98.7 F (37.1 C)  TempSrc: Oral   Oral  SpO2:  94%  95%  Weight:   67.7 kg   Height:        Intake/Output Summary (Last 24 hours) at 03/25/2021 1309 Last data filed at 03/25/2021 0658 Gross per 24 hour  Intake 1120 ml  Output 285 ml  Net 835 ml   Filed Weights   03/23/21 0104 03/24/21 0500 03/25/21 0500  Weight: 72 kg 70.8 kg 67.7 kg    Examination:  General exam: Appears calm and comfortable and in no acute distress. Conversant Respiratory: Clear to auscultation. Respiratory effort normal with no intercostal retractions or use of accessory muscles Cardiovascular: S1 & S2 heard, RRR. No murmurs, rubs, gallops or clicks. No edema Gastrointestinal: Abdomen is Significantly distended, soft  but tight and nontender. No masses felt. Slightly hyperactive bowel sounds heard Neurologic: No focal neurological deficits Musculoskeletal: No calf tenderness Skin: No  cyanosis. No new rashes Psychiatry: Somnolent but otherwise alert and oriented. Memory intact. Mood & affect appropriate   Data Reviewed: I have personally reviewed following labs and imaging studies  CBC Lab Results  Component Value Date   WBC 21.0 (H) 03/25/2021   RBC 3.23 (L) 03/25/2021   HGB 11.1 (L) 03/25/2021   HCT 32.9 (L) 03/25/2021   MCV 101.9 (H) 03/25/2021   MCH 34.4 (H) 03/25/2021   PLT 184 03/25/2021   MCHC 33.7 03/25/2021   RDW 13.7 03/25/2021   LYMPHSABS 1.7 10/05/2020   MONOABS 0.8 10/05/2020   EOSABS 0.3 10/05/2020   BASOSABS 0.1 63/84/6659     Last metabolic panel Lab Results  Component Value Date   NA 139 03/25/2021   K 4.3 03/25/2021   CL 110 03/25/2021   CO2 21 (L) 03/25/2021   BUN 37 (H) 03/25/2021   CREATININE 2.37 (H) 03/25/2021   GLUCOSE 151 (H) 03/25/2021   GFRNONAA 25 (L) 03/25/2021   GFRAA 36 (L) 07/29/2018   CALCIUM 9.1 03/25/2021   PHOS 3.6 01/18/2018   PROT 7.1 03/22/2021   ALBUMIN 4.2 03/22/2021   BILITOT 1.3 (H) 03/22/2021   ALKPHOS 78 03/22/2021   AST 25 03/22/2021   ALT 26 03/22/2021   ANIONGAP 8 03/25/2021    CBG (last 3)  Recent Labs    03/25/21 0412 03/25/21 0753 03/25/21 1142  GLUCAP 147* 127* 242*     GFR: Estimated Creatinine Clearance: 19 mL/min (A) (by C-G formula based on SCr of 2.37 mg/dL (H)).  Coagulation Profile: No results for input(s): INR, PROTIME in the last 168 hours.  Recent Results (from the past 240 hour(s))  Urine culture     Status: None   Collection Time: 03/22/21  3:23 PM   Specimen: Urine, Random  Result Value Ref Range Status   Specimen Description   Final    URINE, RANDOM Performed at [facility], [address], [place], [place] [address]    Special Requests   Final    NONE Performed at  [facility], [address], [place], [place] [address]    Culture   Final    NO GROWTH Performed at [facility], [place] [address], [place], [place] [address]     Report Status 03/24/2021 FINAL  Final  Resp Panel by RT-PCR (Flu A&B, Covid) Nasopharyngeal Swab     Status: None   Collection Time: 03/22/21 10:55 PM   Specimen: Nasopharyngeal Swab; Nasopharyngeal(NP) swabs in vial transport medium  Result Value Ref Range Status   SARS Coronavirus 2 by RT PCR NEGATIVE NEGATIVE Final    Comment: (NOTE) SARS-CoV-2 target nucleic acids are NOT DETECTED.  The SARS-CoV-2 RNA is generally detectable in upper respiratory specimens during the acute phase of infection. The lowest concentration of SARS-CoV-2 viral copies this assay can detect is 138 copies/mL. A negative result does not preclude SARS-Cov-2 infection and should not be used as the sole basis for treatment or other patient management decisions. A negative result may occur with  improper specimen collection/handling, submission of specimen other than nasopharyngeal swab, presence of viral mutation(s) within the areas targeted by this assay, and inadequate number of viral copies(<138 copies/mL). A negative result must be combined with clinical observations, patient history, and epidemiological information. The expected result is Negative.  Fact Sheet for Patients:  EntrepreneurPulse.com.au  Fact Sheet for Healthcare Providers:  IncredibleEmployment.be  This test is no t yet approved or cleared by the [place] FDA and  has been authorized for detection and/or diagnosis of SARS-CoV-2 by FDA under an Emergency Use Authorization (EUA). This EUA will remain  in effect (meaning this test can be used) for the duration of the COVID-19 declaration under Section 564(b)(1) of the Act, 21 U.S.C.section 360bbb-3(b)(1), unless the authorization is terminated  or revoked sooner.       Influenza A by PCR NEGATIVE NEGATIVE Final   Influenza B by PCR NEGATIVE NEGATIVE Final    Comment: (NOTE) The Xpert Xpress SARS-CoV-2/FLU/RSV plus assay is intended as an aid in  the diagnosis of influenza from Nasopharyngeal swab specimens and should not be used as a sole basis for treatment. Nasal washings and aspirates are unacceptable for Xpert Xpress SARS-CoV-2/FLU/RSV testing.  Fact Sheet for Patients: EntrepreneurPulse.com.au  Fact Sheet for Healthcare Providers: IncredibleEmployment.be  This test is not yet approved or cleared by the [place] FDA and has been authorized for detection and/or diagnosis of SARS-CoV-2 by FDA under an Emergency Use Authorization (EUA). This EUA will remain in effect (meaning this test can be used) for the duration of the COVID-19 declaration under Section 564(b)(1) of the Act, 21 U.S.C. section 360bbb-3(b)(1), unless the authorization is terminated or revoked.  Performed at [facility], [address] [place], [place] [address]   MRSA PCR Screening     Status: None   Collection Time: 03/24/21  3:20 AM   Specimen: Nasopharyngeal  Result Value Ref Range Status   MRSA by PCR NEGATIVE NEGATIVE Final    Comment:        The GeneXpert MRSA Assay (FDA approved for NASAL specimens only), is one component of a comprehensive MRSA colonization surveillance program. It is not intended to diagnose MRSA infection nor to guide or monitor treatment for MRSA infections. Performed at [facility], [address] [place], [place] [address]         Radiology Studies: DG Abd 1 View  Result Date: 03/24/2021 CLINICAL DATA:  87 year old male status post cystoscopy and left ureteroscopy with stone removal. EXAM: ABDOMEN - 1 VIEW COMPARISON:  CT abdomen pelvis dated 03/22/2021. FINDINGS: Left ureteral stent with proximal tip in the region of the left renal silhouette and distal end within the urinary bladder. The previously seen stone in the distal left ureter is not identified. No radiopaque calculus noted along the course of the stent. There is moderate amount of stool  throughout the colon. There is air in the transverse colon. Right upper quadrant cholecystectomy clips. Degenerative changes of the spine. No acute osseous pathology. IMPRESSION: Left ureteral stent. No radiopaque calculus along the course of the stent. Electronically Signed   By: Anner Crete M.D.   On: 03/24/2021 19:00   DG OR UROLOGY CYSTO IMAGE ([facility] ONLY)  Result Date: 03/24/2021 There is no interpretation for this exam.  This order is for images obtained during a surgical procedure.  Please See "Surgeries" Tab for more information regarding the procedure.        Scheduled Meds: . aspirin EC  81 mg Oral Daily  . docusate sodium  100 mg Oral BID  . heparin  5,000 Units Subcutaneous Q8H  . insulin aspart  0-9 Units Subcutaneous Q4H  . insulin glargine  15 Units Subcutaneous q morning  . iohexol  500 mL Oral Q1 Hr x 2  . levothyroxine  50 mcg Oral Q0600  .  linagliptin  5 mg Oral Daily  . multivitamin with minerals  1 tablet Oral Daily  . NON FORMULARY   Oral Daily  . pantoprazole  40 mg Oral Daily  . polyethylene glycol  17 g Oral BID  . rosuvastatin  5 mg Oral QODAY  . senna  1 tablet Oral BID  . sodium chloride flush  3 mL Intravenous Q12H  . tamsulosin  0.4 mg Oral Daily   Continuous Infusions: . cefTRIAXone (ROCEPHIN)  IV 1 g (03/25/21 0947)     LOS: 2 days     Cordelia Poche, MD [facility] 03/25/2021, 1:09 PM  If 7PM-7AM, please contact night-coverage [email or web address]

## 2021-03-25 NOTE — Progress Notes (Signed)
Voided 225 ml during last hours of shift. Gave morphine for lower back pain. Resting comfortably now.

## 2021-03-25 NOTE — Progress Notes (Signed)
Patient has urge to void but only voids small amounts. Total has been about 60 mls this shift, 20 mls on 3 occurrences, states he feels "fullness" on lower abdominal area and has been uncomfortable most of night. SMOG enema, miralax, and colace given with no BM as of now. BP elevated during beginning of shift 175/68, Vicodin given for lower abdominal pain and BP was 165/57 1 hr later. Midnight BP: 173/61. Provider notified and started 0.9% NS @100 .   4am BP: 178/68.

## 2021-03-26 LAB — BASIC METABOLIC PANEL
Anion gap: 8 (ref 5–15)
BUN: 41 mg/dL — ABNORMAL HIGH (ref 8–23)
CO2: 23 mmol/L (ref 22–32)
Calcium: 9.2 mg/dL (ref 8.9–10.3)
Chloride: 106 mmol/L (ref 98–111)
Creatinine, Ser: 2.54 mg/dL — ABNORMAL HIGH (ref 0.61–1.24)
GFR, Estimated: 23 mL/min — ABNORMAL LOW (ref >60)
Glucose, Bld: 126 mg/dL — ABNORMAL HIGH (ref 70–99)
Potassium: 4.2 mmol/L (ref 3.5–5.1)
Sodium: 137 mmol/L (ref 135–145)

## 2021-03-26 LAB — CBC
HCT: 31.5 % — ABNORMAL LOW (ref 39.0–52.0)
Hemoglobin: 10.6 g/dL — ABNORMAL LOW (ref 13.0–17.0)
MCH: 34 pg (ref 26.0–34.0)
MCHC: 33.7 g/dL (ref 30.0–36.0)
MCV: 101 fL — ABNORMAL HIGH (ref 80.0–100.0)
Platelets: 175 10*3/uL (ref 150–400)
RBC: 3.12 MIL/uL — ABNORMAL LOW (ref 4.22–5.81)
RDW: 13.9 % (ref 11.5–15.5)
WBC: 17.7 10*3/uL — ABNORMAL HIGH (ref 4.0–10.5)
nRBC: 0 % (ref 0.0–0.2)

## 2021-03-26 LAB — GLUCOSE, CAPILLARY
Glucose-Capillary: 119 mg/dL — ABNORMAL HIGH (ref 70–99)
Glucose-Capillary: 121 mg/dL — ABNORMAL HIGH (ref 70–99)
Glucose-Capillary: 121 mg/dL — ABNORMAL HIGH (ref 70–99)
Glucose-Capillary: 127 mg/dL — ABNORMAL HIGH (ref 70–99)
Glucose-Capillary: 179 mg/dL — ABNORMAL HIGH (ref 70–99)
Glucose-Capillary: 180 mg/dL — ABNORMAL HIGH (ref 70–99)

## 2021-03-26 MED ORDER — INSULIN ASPART 100 UNIT/ML ~~LOC~~ SOLN
0.0000 [IU] | Freq: Three times a day (TID) | SUBCUTANEOUS | Status: DC
  Administered 2021-03-26 – 2021-03-27 (×3): 2 [IU] via SUBCUTANEOUS
  Filled 2021-03-26 (×3): qty 1

## 2021-03-26 NOTE — Progress Notes (Signed)
Urology Inpatient Progress Note  Subjective: Patient was found to be in acute urinary retention yesterday with bladder scan 557 mL per nursing.  He subsequently underwent CTAP with contrast, which revealed a distended urinary bladder, enlarged prostate, and gaseous distention of the colon consistent with ileus.  He finally underwent Foley catheter placement, 16 French Foley noted in place today draining red urine without clots. Creatinine up today, 2.54.  WBC count down today, 17.7.  Hemoglobin stable, 10.6. Patient reports resolution of abdominal pain following Foley catheter placement.  He reports he is not hungry.  Anti-infectives: Anti-infectives (From admission, onward)   Start     Dose/Rate Route Frequency Ordered Stop   03/23/21 0900  cefTRIAXone (ROCEPHIN) 1 g in sodium chloride 0.9 % 100 mL IVPB        1 g 200 mL/hr over 30 Minutes Intravenous Every 24 hours 03/23/21 0734        Current Facility-Administered Medications  Medication Dose Route Frequency Provider Last Rate Last Admin  . acetaminophen (TYLENOL) tablet 650 mg  650 mg Oral Q6H PRN [name], MD   650 mg at 03/25/21 2054  . albuterol (PROVENTIL) (2.5 MG/3ML) 0.083% nebulizer solution 2.5 mg  2.5 mg Nebulization Q2H PRN Stoioff, Scott C, MD      . aspirin EC tablet 81 mg  81 mg Oral Daily Stoioff, Scott C, MD   81 mg at 03/25/21 0924  . calcium carbonate (TUMS - dosed in mg elemental calcium) chewable tablet 200 mg of elemental calcium  1 tablet Oral TID PRN Mariel Aloe, MD      . cefTRIAXone (ROCEPHIN) 1 g in sodium chloride 0.9 % 100 mL IVPB  1 g Intravenous Q24H Stoioff, Scott C, MD 200 mL/hr at 03/25/21 0947 1 g at 03/25/21 0947  . Chlorhexidine Gluconate Cloth 2 % PADS 6 each  6 each Topical Q2200 Mariel Aloe, MD   6 each at 03/25/21 2203  . docusate sodium (COLACE) capsule 100 mg  100 mg Oral BID Stoioff, Scott C, MD   100 mg at 03/25/21 2054  . heparin injection 5,000 Units  5,000 Units Subcutaneous  Q8H Stoioff, Scott C, MD   5,000 Units at 03/26/21 0509  . HYDROcodone-acetaminophen (NORCO/VICODIN) 5-325 MG per tablet 1-2 tablet  1-2 tablet Oral Q4H PRN [name], MD   2 tablet at 03/26/21 0310  . insulin aspart (novoLOG) injection 0-9 Units  0-9 Units Subcutaneous Q4H [name], MD   3 Units at 03/25/21 2054  . insulin glargine (LANTUS) injection 15 Units  15 Units Subcutaneous q morning [name], MD   15 Units at 03/25/21 [phone]  . levothyroxine (SYNTHROID) tablet 50 mcg  50 mcg Oral Q0600 [name], MD   50 mcg at 03/26/21 0509  . linagliptin (TRADJENTA) tablet 5 mg  5 mg Oral Daily Stoioff, Scott C, MD   5 mg at 03/25/21 0928  . morphine 2 MG/ML injection 2 mg  2 mg Intravenous Q4H PRN Stoioff, Scott C, MD   2 mg at 03/25/21 0537  . multivitamin with minerals tablet 1 tablet  1 tablet Oral Daily [name], MD   1 tablet at 03/25/21 0923  . NON FORMULARY   Oral Daily Stoioff, Scott C, MD      . ondansetron (ZOFRAN) tablet 4 mg  4 mg Oral Q6H PRN Stoioff, Scott C, MD   4 mg at 03/23/21 1145   Or  . ondansetron (ZOFRAN)  injection 4 mg  4 mg Intravenous Q6H PRN Stoioff, Scott C, MD   4 mg at 03/25/21 1338  . pantoprazole (PROTONIX) EC tablet 40 mg  40 mg Oral Daily Stoioff, Scott C, MD   40 mg at 03/25/21 0927  . polyethylene glycol (MIRALAX / GLYCOLAX) packet 17 g  17 g Oral BID [name], MD   17 g at 03/25/21 2055  . rosuvastatin (CRESTOR) tablet 5 mg  5 mg Oral QODAY Stoioff, Scott C, MD   5 mg at 03/25/21 0924  . senna (SENOKOT) tablet 8.6 mg  1 tablet Oral BID Stoioff, Scott C, MD   8.6 mg at 03/25/21 2053  . sodium chloride flush (NS) 0.9 % injection 3 mL  3 mL Intravenous Q12H Stoioff, Scott C, MD   3 mL at 03/25/21 2054  . tamsulosin (FLOMAX) capsule 0.4 mg  0.4 mg Oral Daily Stoioff, Scott C, MD   0.4 mg at 03/25/21 3235     Objective: Vital signs in last 24 hours: Temp:  [97.7 F (36.5 C)-99 F (37.2 C)] 98.7 F (37.1 C) (04/05  0750) Pulse Rate:  [76-88] 76 (04/05 0750) Resp:  [14-21] 16 (04/05 0750) BP: (136-163)/(57-73) 145/57 (04/05 0750) SpO2:  [94 %-97 %] 94 % (04/05 0750)  Intake/Output from previous day: 04/04 0701 - 04/05 0700 In: 360 [P.O.:360] Out: 700 [Urine:700] Intake/Output this shift: No intake/output data recorded.  Physical Exam Vitals and nursing note reviewed.  Constitutional:      General: He is not in acute distress.    Appearance: He is not ill-appearing, toxic-appearing or diaphoretic.  HENT:     Head: Normocephalic and atraumatic.  Pulmonary:     Effort: Pulmonary effort is normal. No respiratory distress.  Skin:    General: Skin is warm and dry.  Neurological:     Mental Status: He is alert and oriented to person, place, and time.  Psychiatric:        Mood and Affect: Mood normal.        Behavior: Behavior normal.    Lab Results:  Recent Labs    03/25/21 0445 03/26/21 0550  WBC 21.0* 17.7*  HGB 11.1* 10.6*  HCT 32.9* 31.5*  PLT 184 175   BMET Recent Labs    03/25/21 0445 03/26/21 0551  NA 139 137  K 4.3 4.2  CL 110 106  CO2 21* 23  GLUCOSE 151* 126*  BUN 37* 41*  CREATININE 2.37* 2.54*  CALCIUM 9.1 9.2   Studies/Results: CT ABDOMEN PELVIS WO CONTRAST  Result Date: 03/25/2021 CLINICAL DATA:  Nausea and vomiting, abdominal distension, cystoscopy yesterday for stone, midline abdominal pain without flank pain. History type II diabetes mellitus, hypertension, kidney stones, coronary artery disease, peripheral vascular disease EXAM: CT ABDOMEN AND PELVIS WITHOUT CONTRAST TECHNIQUE: Multidetector CT imaging of the abdomen and pelvis was performed following the standard protocol without IV contrast. Sagittal and coronal MPR images reconstructed from axial data set. Dilute oral contrast was administered COMPARISON:  03/22/2021 FINDINGS: Lower chest: Small BILATERAL pleural effusions. Bibasilar atelectasis greater on LEFT. Hepatobiliary: Gallbladder surgically absent.   Liver unremarkable. Pancreas: Markedly atrophic pancreas without mass Spleen: Normal appearance Adrenals/Urinary Tract: Adrenal glands normal appearance. Atrophic RIGHT kidney with small extrarenal pelvis in peripelvic renal cyst. Tiny hyperdense lesion at posterior RIGHT kidney 9 mm diameter unchanged. Large cyst at upper pole LEFT kidney 8.3 x 8.0 cm image 24. LEFT hydronephrosis with ureteral stent extending from renal pelvis to urinary bladder. Bladder well distended,  otherwise unremarkable. Mild LEFT perinephric and Peri ureteral edema likely related to interval stent placement. Previously identified distal LEFT ureteral calculus no longer seen. Stomach/Bowel: Sigmoid diverticulosis without evidence of diverticulitis. Gaseous distension of the proximal half of the colon question ileus, RIGHT colon maximum 9.9 cm diameter. Stomach and remaining bowel loops unremarkable. Vascular/Lymphatic: Extensive atherosclerotic calcifications aorta, visceral arteries, iliac arteries, coronary arteries. Aorta normal caliber. No adenopathy. Reproductive: Prostatic enlargement with gland measuring 5.1 x 5.6 x 5.2 cm (volume = 78 cm^3) Other: Small supraumbilical ventral hernia containing fat RIGHT of midline. No free air or free fluid. LEFT inguinal hernia containing fat and small amount of edema. Musculoskeletal: Osseous demineralization. No acute osseous findings. IMPRESSION: LEFT hydronephrosis with ureteral stent extending from renal pelvis to urinary bladder. Previously identified distal LEFT ureteral calculus no longer seen. Atrophic RIGHT kidney with multiple renal cysts as well as a 9 mm hyperdense lesion. Sigmoid diverticulosis without evidence of diverticulitis. Prostatic enlargement. Small BILATERAL pleural effusions and bibasilar atelectasis greater on LEFT. Small supraumbilical and LEFT inguinal hernias containing fat and small amount of edema. Gaseous distension of the proximal half of the colon question ileus,  RIGHT colon up to 9.8 cm diameter. Aortic Atherosclerosis (ICD10-I70.0). Electronically Signed   By: Lavonia Dana M.D.   On: 03/25/2021 15:56   Assessment & Plan: 87 year old male s/p left ureteroscopy with stone removal and left ureteral stent placement with Dr. Bernardo Heater for management of a 3 mm distal left ureteral stone associated with intractable nausea and vomiting.  He developed postoperative urinary retention and ileus, now with Foley catheter in place for bladder decompression.  Creatinine slightly up today consistent with prolonged urinary retention before Foley placement.  I suspect this will downtrend tomorrow.  He continues to have gross hematuria today, likely associated with his ureteral stent.  Foley catheter was draining well upon rounding this morning, however I will plan for bladder irrigation at the bedside later today to ensure no residual clot in his bladder, as he was noted to pass clots with bladder drainage yesterday.  Ultimately, I may have to upsize his Foley catheter to do this successfully as a 40 [place] was placed despite my request for an 12 [place].  Recommendations: -Continue Foley catheter and hand irrigate as needed if it stops draining -Outpatient cystoscopy stent removal and voiding trial with Dr. Bernardo Heater in 1 week (already scheduled)  Debroah Loop, PA-C 03/26/2021

## 2021-03-26 NOTE — Progress Notes (Addendum)
PROGRESS NOTE    David Henry  PJA:[id number] DOB: 1926/02/29 DOA: 03/22/2021 PCP: Abner Greenspan, MD   Brief Narrative: David Henry is a 86 y.o. male with a history of hypertension, CAD status post PCI, mitral sufficiency, hyperlipidemia, CKD stage IIIb, bilateral carotid artery stenosis, diabetes mellitus type 2, hypothyroidism, peripheral vascular disease, BPH.  Patient presented secondary to severe bilateral flank pain and found to have ureteral calculus with associated hydronephrosis and hydroureter.  Urology consulted. Ureteral stent placed with development of hematuria. Also with acute urinary retention s/p foley.   Assessment & Plan:  Principal Problem:   Nephrolithiasis Active Problems:   Controlled type 2 diabetes mellitus with chronic kidney disease (HCC)   Hyperlipidemia associated with type 2 diabetes mellitus ([place])   Essential hypertension   Coronary atherosclerosis   Hypothyroid   Chronic kidney disease (CKD)   AKI (acute kidney injury) ([place])  Nephrolithiasis Ureteral calculus Recurrent issue. Associated hydronephrosis/hydroureter and mild AKI on CKD. Urology consulted and performed uteroscopy with stone removal/uretheral stent placement.  -Urology recommendations: outpatient follow-up  Dysuria Urinalysis with WBC and some bacteria. RBCs in setting of renal stone. Urine culture with no growth. -Continue Ceftriaxone IV  AKI on CKD stage IIIb AKI is in setting of above issues and possibly secondary to above issues. Baseline creatinine of bout 1.7. Creatinine of 2.05 on admission and acutely worsened to a peak of 2.77. initial improvement with slight bump probably related to retention from 4/4 prior to foley catheter decompression.  Intractable nausea/vomiting Likely related to renal colic in setting of above. -Continue Zofran prn  Abdominal pain Abdominal distension Possibly related to constipation, however this was acutely worsening. Patient with  worst pain he has ever felt and now has associated leukocytosis. Unknown if related. CT abdomen/pelvis with evidence of possible ileus. Patient with bowel function. Pain is improved since foley was placed.  Hematuria In setting of ureteral stents. Urology performed irrigation with recommendations for outpatient follow-up.  Diabetes mellitus, type 2 Patient is on Lantus, Januvia, glimepiride as an outpatient. Last hemoglobin A1C of 6.8% from 01/10/21. -Continue Lantus and SSI -Family reports hypoglycemia as an outpatient. With hemoglobin A1C, would recommend discontinuing glimepiride  Hypothyroidism -Continue Synthroid 50 mcg daily  Hyperlipidemia CAD s/p PCI -Continue Crestor  Primary hypertension Patient is on lisinopril as an outpatient. Lisinopril held secondary to mild AKI -hydralazine prn  Constipation -Miralax BID -Colace BID   DVT prophylaxis: Heparin Code Status:   Code Status: Full Code Family Communication: Daughter and wife at bedside Disposition Plan: Discharge home likely in 1-2 days pending downtrend of creatinine, stable hemoglobin, improvement of leukocytosis   Consultants:   Urology  Procedures:   None  Antimicrobials:  Ceftriaxone IV    Subjective: Some abdominal pain but improved. Eating a little better now.  Objective: Vitals:   03/26/21 0419 03/26/21 0419 03/26/21 0750 03/26/21 1115  BP: (!) 142/68 (!) 142/68 (!) 145/57 (!) 155/74  Pulse: 88 88 76 83  Resp: 18 18 16 18   Temp: 98.5 F (36.9 C) 98.5 F (36.9 C) 98.7 F (37.1 C) 98.4 F (36.9 C)  TempSrc: Oral Oral Oral Oral  SpO2: 95% 95% 94% 94%  Weight:      Height:        Intake/Output Summary (Last 24 hours) at 03/26/2021 1520 Last data filed at 03/26/2021 1300 Gross per 24 hour  Intake 600 ml  Output 700 ml  Net -100 ml   Filed Weights   03/23/21 0104 03/24/21  0500 03/25/21 0500  Weight: 72 kg 70.8 kg 67.7 kg    Examination:  General exam: Appears calm and comfortable  and in no acute distress. Conversant Respiratory: Clear to auscultation. Respiratory effort normal with no intercostal retractions or use of accessory muscles Cardiovascular: S1 & S2 heard, RRR. No murmurs, rubs, gallops or clicks.  Gastrointestinal: Abdomen is significantly distended, soft and nontender. No masses felt. Normal bowel sounds heard Neurologic: No focal neurological deficits Musculoskeletal: No calf tenderness Skin: No cyanosis. No new rashes Psychiatry: Alert and oriented. Memory intact. Mood & affect appropriate   Data Reviewed: I have personally reviewed following labs and imaging studies  CBC Lab Results  Component Value Date   WBC 17.7 (H) 03/26/2021   RBC 3.12 (L) 03/26/2021   HGB 10.6 (L) 03/26/2021   HCT 31.5 (L) 03/26/2021   MCV 101.0 (H) 03/26/2021   MCH 34.0 03/26/2021   PLT 175 03/26/2021   MCHC 33.7 03/26/2021   RDW 13.9 03/26/2021   LYMPHSABS 1.7 10/05/2020   MONOABS 0.8 10/05/2020   EOSABS 0.3 10/05/2020   BASOSABS 0.1 95/18/8416     Last metabolic panel Lab Results  Component Value Date   NA 137 03/26/2021   K 4.2 03/26/2021   CL 106 03/26/2021   CO2 23 03/26/2021   BUN 41 (H) 03/26/2021   CREATININE 2.54 (H) 03/26/2021   GLUCOSE 126 (H) 03/26/2021   GFRNONAA 23 (L) 03/26/2021   GFRAA 36 (L) 07/29/2018   CALCIUM 9.2 03/26/2021   PHOS 3.6 01/18/2018   PROT 7.1 03/22/2021   ALBUMIN 4.2 03/22/2021   BILITOT 1.3 (H) 03/22/2021   ALKPHOS 78 03/22/2021   AST 25 03/22/2021   ALT 26 03/22/2021   ANIONGAP 8 03/26/2021    CBG (last 3)  Recent Labs    03/26/21 0415 03/26/21 0803 03/26/21 1141  GLUCAP 121* 127* 119*     GFR: Estimated Creatinine Clearance: 17.8 mL/min (A) (by C-G formula based on SCr of 2.54 mg/dL (H)).  Coagulation Profile: No results for input(s): INR, PROTIME in the last 168 hours.  Recent Results (from the past 240 hour(s))  Urine culture     Status: None   Collection Time: 03/22/21  3:23 PM   Specimen:  Urine, Random  Result Value Ref Range Status   Specimen Description   Final    URINE, RANDOM Performed at [facility], [address] [place], [place] [address]    Special Requests   Final    NONE Performed at [facility] [address] [place], [place] [address]    Culture   Final    NO GROWTH Performed at [facility], [place] [address], [place], [place] [address]    Report Status 03/24/2021 FINAL  Final  Resp Panel by RT-PCR (Flu A&B, Covid) Nasopharyngeal Swab     Status: None   Collection Time: 03/22/21 10:55 PM   Specimen: Nasopharyngeal Swab; Nasopharyngeal(NP) swabs in vial transport medium  Result Value Ref Range Status   SARS Coronavirus 2 by RT PCR NEGATIVE NEGATIVE Final    Comment: (NOTE) SARS-CoV-2 target nucleic acids are NOT DETECTED.  The SARS-CoV-2 RNA is generally detectable in upper respiratory specimens during the acute phase of infection. The lowest concentration of SARS-CoV-2 viral copies this assay can detect is 138 copies/mL. A negative result does not preclude SARS-Cov-2 infection and should not be used as the sole basis for treatment or other patient management decisions. A negative result may occur with  improper specimen collection/handling, submission of specimen other than nasopharyngeal swab, presence of viral mutation(s) within the areas targeted by this assay, and inadequate number of viral copies(<138 copies/mL). A negative result must be combined with clinical observations, patient history, and epidemiological information. The expected result is Negative.  Fact Sheet for Patients:  EntrepreneurPulse.com.au  Fact Sheet for Healthcare Providers:  IncredibleEmployment.be  This test is no t yet approved or cleared by the [place] FDA and  has been authorized for detection and/or diagnosis of SARS-CoV-2 by FDA under an Emergency Use Authorization (EUA). This EUA will remain   in effect (meaning this test can be used) for the duration of the COVID-19 declaration under Section 564(b)(1) of the Act, 21 U.S.C.section 360bbb-3(b)(1), unless the authorization is terminated  or revoked sooner.       Influenza A by PCR NEGATIVE NEGATIVE Final   Influenza B by PCR NEGATIVE NEGATIVE Final    Comment: (NOTE) The Xpert Xpress SARS-CoV-2/FLU/RSV plus assay is intended as an aid in the diagnosis of influenza from Nasopharyngeal swab specimens and should not be used as a sole basis for treatment. Nasal washings and aspirates are unacceptable for Xpert Xpress SARS-CoV-2/FLU/RSV testing.  Fact Sheet for Patients: EntrepreneurPulse.com.au  Fact Sheet for Healthcare Providers: IncredibleEmployment.be  This test is not yet approved or cleared by the [place] FDA and has been authorized for detection and/or diagnosis of SARS-CoV-2 by FDA under an Emergency Use Authorization (EUA). This EUA will remain in effect (meaning this test can be used) for the duration of the COVID-19 declaration under Section 564(b)(1) of the Act, 21 U.S.C. section 360bbb-3(b)(1), unless the authorization is terminated or revoked.  Performed at [facility], [address] [place], [place] [address]   MRSA PCR Screening     Status: None   Collection Time: 03/24/21  3:20 AM   Specimen: Nasopharyngeal  Result Value Ref Range Status   MRSA by PCR NEGATIVE NEGATIVE Final    Comment:        The GeneXpert MRSA Assay (FDA approved for NASAL specimens only), is one component of a comprehensive MRSA colonization surveillance program. It is not intended to diagnose MRSA infection nor to guide or monitor treatment for MRSA infections. Performed at [facility], [address] [place], [place] [address]         Radiology Studies: CT ABDOMEN PELVIS WO CONTRAST  Result Date: 03/25/2021 CLINICAL DATA:  Nausea and vomiting,  abdominal distension, cystoscopy yesterday for stone, midline abdominal pain without flank pain. History type II diabetes mellitus, hypertension, kidney stones, coronary artery disease, peripheral vascular disease EXAM: CT ABDOMEN AND PELVIS WITHOUT CONTRAST TECHNIQUE: Multidetector CT imaging of the abdomen and pelvis was performed following the standard protocol without IV contrast. Sagittal and coronal MPR images reconstructed from axial data set. Dilute oral contrast was administered COMPARISON:  03/22/2021 FINDINGS: Lower chest: Small BILATERAL pleural effusions. Bibasilar atelectasis greater on LEFT. Hepatobiliary: Gallbladder surgically absent.  Liver unremarkable. Pancreas: Markedly atrophic pancreas without mass Spleen: Normal appearance Adrenals/Urinary Tract: Adrenal glands normal appearance. Atrophic RIGHT kidney with small extrarenal pelvis in peripelvic renal cyst. Tiny hyperdense lesion at posterior RIGHT kidney 9 mm diameter unchanged. Large cyst at upper pole LEFT kidney 8.3 x 8.0 cm image 24. LEFT hydronephrosis with ureteral stent extending from renal pelvis to urinary bladder. Bladder well distended, otherwise unremarkable. Mild LEFT perinephric and Peri ureteral edema likely related to interval stent placement. Previously identified distal LEFT ureteral calculus no longer seen. Stomach/Bowel: Sigmoid  diverticulosis without evidence of diverticulitis. Gaseous distension of the proximal half of the colon question ileus, RIGHT colon maximum 9.9 cm diameter. Stomach and remaining bowel loops unremarkable. Vascular/Lymphatic: Extensive atherosclerotic calcifications aorta, visceral arteries, iliac arteries, coronary arteries. Aorta normal caliber. No adenopathy. Reproductive: Prostatic enlargement with gland measuring 5.1 x 5.6 x 5.2 cm (volume = 78 cm^3) Other: Small supraumbilical ventral hernia containing fat RIGHT of midline. No free air or free fluid. LEFT inguinal hernia containing fat and  small amount of edema. Musculoskeletal: Osseous demineralization. No acute osseous findings. IMPRESSION: LEFT hydronephrosis with ureteral stent extending from renal pelvis to urinary bladder. Previously identified distal LEFT ureteral calculus no longer seen. Atrophic RIGHT kidney with multiple renal cysts as well as a 9 mm hyperdense lesion. Sigmoid diverticulosis without evidence of diverticulitis. Prostatic enlargement. Small BILATERAL pleural effusions and bibasilar atelectasis greater on LEFT. Small supraumbilical and LEFT inguinal hernias containing fat and small amount of edema. Gaseous distension of the proximal half of the colon question ileus, RIGHT colon up to 9.8 cm diameter. Aortic Atherosclerosis (ICD10-I70.0). Electronically Signed   By: Lavonia Dana M.D.   On: 03/25/2021 15:56   DG Abd 1 View  Result Date: 03/24/2021 CLINICAL DATA:  87 year old male status post cystoscopy and left ureteroscopy with stone removal. EXAM: ABDOMEN - 1 VIEW COMPARISON:  CT abdomen pelvis dated 03/22/2021. FINDINGS: Left ureteral stent with proximal tip in the region of the left renal silhouette and distal end within the urinary bladder. The previously seen stone in the distal left ureter is not identified. No radiopaque calculus noted along the course of the stent. There is moderate amount of stool throughout the colon. There is air in the transverse colon. Right upper quadrant cholecystectomy clips. Degenerative changes of the spine. No acute osseous pathology. IMPRESSION: Left ureteral stent. No radiopaque calculus along the course of the stent. Electronically Signed   By: Anner Crete M.D.   On: 03/24/2021 19:00        Scheduled Meds: . aspirin EC  81 mg Oral Daily  . Chlorhexidine Gluconate Cloth  6 each Topical Q2200  . docusate sodium  100 mg Oral BID  . heparin  5,000 Units Subcutaneous Q8H  . insulin aspart  0-9 Units Subcutaneous Q4H  . insulin glargine  15 Units Subcutaneous q morning  .  levothyroxine  50 mcg Oral Q0600  . linagliptin  5 mg Oral Daily  . multivitamin with minerals  1 tablet Oral Daily  . NON FORMULARY   Oral Daily  . pantoprazole  40 mg Oral Daily  . polyethylene glycol  17 g Oral BID  . rosuvastatin  5 mg Oral QODAY  . senna  1 tablet Oral BID  . sodium chloride flush  3 mL Intravenous Q12H  . tamsulosin  0.4 mg Oral Daily   Continuous Infusions: . cefTRIAXone (ROCEPHIN)  IV 1 g (03/26/21 0924)     LOS: 3 days     Cordelia Poche, MD [facility] 03/26/2021, 3:20 PM  If 7PM-7AM, please contact night-coverage [email or web address]

## 2021-03-26 NOTE — Evaluation (Signed)
Occupational Therapy Evaluation Patient Details Name: David Henry MRN: [id number] DOB: [birth year] Today's Date: 03/26/2021    History of Present Illness 87 y.o. male with a history of hypertension, CAD status post PCI, mitral sufficiency, hyperlipidemia, CKD stage IIIb, bilateral carotid artery stenosis, diabetes mellitus type 2, hypothyroidism, peripheral vascular disease, BPH.   Clinical Impression   Patient presenting with decreased I in self care, balance, functional mobility/transfers, endurance, and safety awareness.  Patient reports living at home with wife and independent without use of AD PTA. Pt does endorse owning equipment but does not use at baseline. Pt currently ambulates with supervision and use of RW into bathroom with supervision for standing ~ 5 minutes for grooming tasks without LOB. Min guard for simulated toilet transfer. Pt ambulates with min guard without use of AD this session. Pt does endorse feeling "much weaker" than normal. Pt encouraged to sit up in recliner chair at end of session and he was agreeable. All needs within reach.  Patient will benefit from acute OT to increase overall independence in the areas of ADLs, functional mobility, and safety awareness in order to safely discharge home with wife.    Follow Up Recommendations  No OT follow up;Supervision - Intermittent    Equipment Recommendations  Tub/shower seat       Precautions / Restrictions Precautions Precautions: Fall      Mobility Bed Mobility Overal bed mobility: Needs Assistance Bed Mobility: Supine to Sit;Sit to Supine     Supine to sit: Supervision;HOB elevated Sit to supine: Supervision;HOB elevated   General bed mobility comments: min cuing for technique and hand placement but no physical assistance    Transfers Overall transfer level: Needs assistance Equipment used: Rolling walker (2 wheeled) Transfers: Stand Pivot Transfers;Sit to/from Stand Sit to Stand:  Supervision Stand pivot transfers: Supervision            Balance Overall balance assessment: Needs assistance Sitting-balance support: Feet supported Sitting balance-Leahy Scale: Good Sitting balance - Comments: no LOB   Standing balance support: During functional activity Standing balance-Leahy Scale: Fair Standing balance comment: reliance on B UE support                           ADL either performed or assessed with clinical judgement   ADL Overall ADL's : Needs assistance/impaired     Grooming: Wash/dry hands;Wash/dry face;Standing;Supervision/safety                   Toilet Transfer: Passenger transport manager- Water quality scientist and Hygiene: Min guard;Sit to/from stand       Functional mobility during ADLs: Min guard;Supervision/safety General ADL Comments: pt progressing to supervision with use of RW and min guard without use of RW this session for functional mobility and transfers     Vision Baseline Vision/History: Wears glasses Wears Glasses: At all times Patient Visual Report: No change from baseline              Pertinent Vitals/Pain Pain Assessment: No/denies pain     Hand Dominance Right   Extremity/Trunk Assessment Upper Extremity Assessment Upper Extremity Assessment: Overall WFL for tasks assessed   Lower Extremity Assessment Lower Extremity Assessment: Defer to PT evaluation       Communication Communication Communication: No difficulties   Cognition Arousal/Alertness: Awake/alert Behavior During Therapy: WFL for tasks assessed/performed Overall Cognitive Status: Within Functional Limits for tasks assessed  Home Living Family/patient expects to be discharged to:: Private residence Living Arrangements: Spouse/significant other Available Help at Discharge: Family;Available 24 hours/day Type of Home: House Home Access: Stairs to enter [employer] of Steps: 5 Entrance Stairs-Rails: Right;Left Home Layout: One level     Bathroom Shower/Tub: Walk-in shower         Home Equipment: Environmental consultant - 2 wheels;Cane - quad          Prior Functioning/Environment Level of Independence: Independent        Comments: lives with wife and does not use AD for functional mobility at baseline.        OT Problem List: Decreased strength;Decreased activity tolerance;Impaired balance (sitting and/or standing);Decreased cognition;Decreased safety awareness;Decreased knowledge of use of DME or AE      OT Treatment/Interventions: Self-care/ADL training;Manual therapy;Therapeutic exercise;Patient/family education;Energy conservation;DME and/or AE instruction;Cognitive remediation/compensation;Therapeutic activities;Balance training    OT Goals(Current goals can be found in the care plan section) Acute Rehab OT Goals Patient Stated Goal: to get better and go home OT Goal Formulation: With patient Time For Goal Achievement: 04/09/21 Potential to Achieve Goals: Good ADL Goals Pt Will Perform Grooming: with modified independence;standing Pt Will Perform Lower Body Dressing: with modified independence;sit to/from stand Pt Will Transfer to Toilet: with modified independence;ambulating Pt Will Perform Toileting - Clothing Manipulation and hygiene: with modified independence;sit to/from stand Pt Will Perform Tub/Shower Transfer: Shower transfer;ambulating;shower seat  OT Frequency: Min 2X/week   Barriers to D/C:    none known at this time          AM-PAC OT "6 Clicks" Daily Activity     Outcome Measure Help from another person eating meals?: None Help from another person taking care of personal grooming?: A Little Help from another person toileting, which includes using toliet, bedpan, or urinal?: A Little Help from another person bathing (including washing, rinsing, drying)?: A Little Help from another person to put on and taking  off regular upper body clothing?: None Help from another person to put on and taking off regular lower body clothing?: A Little 6 Click Score: 20   End of Session Equipment Utilized During Treatment: Rolling walker Nurse Communication: Mobility status  Activity Tolerance: Patient tolerated treatment well Patient left: in chair;with call bell/phone within reach;with chair alarm set  OT Visit Diagnosis: Unsteadiness on feet (R26.81);Repeated falls (R29.6);Muscle weakness (generalized) (M62.81)                Time: 5726-2035 OT Time Calculation (min): 25 min Charges:  OT General Charges $OT Visit: 1 Visit OT Evaluation $OT Eval Moderate Complexity: 1 Mod OT Treatments $Self Care/Home Management : 8-22 mins  Darleen Crocker, MS, OTR/L , CBIS ascom [phone]  03/26/21, 10:57 AM

## 2021-03-26 NOTE — Evaluation (Signed)
Physical Therapy Evaluation Patient Details Name: David Henry MRN: [id number] DOB: Mar 05, 1926 Today's Date: 03/26/2021   History of Present Illness  87 y.o. male with a history of hypertension, CAD status post PCI, mitral sufficiency, hyperlipidemia, CKD stage IIIb, bilateral carotid artery stenosis, diabetes mellitus type 2, hypothyroidism, peripheral vascular disease, BPH.  Clinical Impression  Pt is a pleasant 87 year old male who was admitted for neptrolithiasis. Pt demonstrates all bed mobility/transfers/ambulation at baseline level. Educated on use of AD for endurance. Pt does not require any further PT needs at this time- pt in agreement. Pt will be dc in house and does not require follow up. RN aware. Will dc current orders.     Follow Up Recommendations No PT follow up    Equipment Recommendations  None recommended by PT    Recommendations for Other Services       Precautions / Restrictions Precautions Precautions: Fall Restrictions Weight Bearing Restrictions: No      Mobility  Bed Mobility Overal bed mobility: Needs Assistance Bed Mobility: Supine to Sit     Supine to sit: Min guard Sit to supine: Supervision;HOB elevated   General bed mobility comments: safe technique with upright posture noted    Transfers Overall transfer level: Needs assistance Equipment used: Rolling walker (2 wheeled) Transfers: Sit to/from Stand Sit to Stand: Supervision Stand pivot transfers: Supervision       General transfer comment: Upright posture using RW  Ambulation/Gait Ambulation/Gait assistance: Min guard Gait Distance (Feet): 200 Feet Assistive device: Rolling walker (2 wheeled) Gait Pattern/deviations: WFL(Within Functional Limits)     General Gait Details: safe technique with good cadence. RW used. Slight SOB symptoms noted  Stairs            Wheelchair Mobility    Modified Rankin (Stroke Patients Only)       Balance Overall balance  assessment: Needs assistance Sitting-balance support: Feet supported Sitting balance-Leahy Scale: Good Sitting balance - Comments: no LOB   Standing balance support: Bilateral upper extremity supported Standing balance-Leahy Scale: Good Standing balance comment: reliance on B UE support                             Pertinent Vitals/Pain Pain Assessment: No/denies pain    Home Living Family/patient expects to be discharged to:: Private residence Living Arrangements: Spouse/significant other Available Help at Discharge: Family;Available 24 hours/day Type of Home: House Home Access: Stairs to enter Entrance Stairs-Rails: Right;Left (too wide to reach at same time) Entrance Stairs-Number of Steps: 5 Home Layout: One level Home Equipment: Cane - quad (has 3 wheel RW)      Prior Function Level of Independence: Independent         Comments: lives with wife and does not use AD for functional mobility at baseline.     Hand Dominance   Dominant Hand: Right    Extremity/Trunk Assessment   Upper Extremity Assessment Upper Extremity Assessment: Overall WFL for tasks assessed    Lower Extremity Assessment Lower Extremity Assessment: Generalized weakness (B LE grossly 4+/5)       Communication   Communication: No difficulties  Cognition Arousal/Alertness: Awake/alert Behavior During Therapy: WFL for tasks assessed/performed Overall Cognitive Status: Within Functional Limits for tasks assessed  General Comments      Exercises Other Exercises Other Exercises: ambulated in hallway, adjusted RW and educated on safety and endurance.   Assessment/Plan    PT Assessment Patent does not need any further PT services  PT Problem List         PT Treatment Interventions      PT Goals (Current goals can be found in the Care Plan section)  Acute Rehab PT Goals Patient Stated Goal: to get better and go  home PT Goal Formulation: All assessment and education complete, DC therapy Time For Goal Achievement: 03/26/21 Potential to Achieve Goals: Good    Frequency     Barriers to discharge        Co-evaluation               AM-PAC PT "6 Clicks" Mobility  Outcome Measure Help needed turning from your back to your side while in a flat bed without using bedrails?: None Help needed moving from lying on your back to sitting on the side of a flat bed without using bedrails?: None Help needed moving to and from a bed to a chair (including a wheelchair)?: None Help needed standing up from a chair using your arms (e.g., wheelchair or bedside chair)?: None Help needed to walk in hospital room?: None Help needed climbing 3-5 steps with a railing? : None 6 Click Score: 24    End of Session Equipment Utilized During Treatment: Gait belt Activity Tolerance: Patient tolerated treatment well Patient left: in bed;with bed alarm set Nurse Communication: Mobility status PT Visit Diagnosis: Unsteadiness on feet (R26.81)    Time: 4562-5638 PT Time Calculation (min) (ACUTE ONLY): 17 min   Charges:   PT Evaluation $PT Eval Low Complexity: 1 Low PT Treatments $Gait Training: 8-22 mins        Greggory Stallion, PT, DPT [phone]   Michala Deblanc 03/26/2021, 2:43 PM

## 2021-03-26 NOTE — Progress Notes (Signed)
Brief Progress Note  I returned to the bedside with Zara Council today to irrigate the patient's Foley catheter given his ongoing gross hematuria.  Using clean technique, I instilled approximately 250 mL of sterile water into the bladder through the 16 French Foley catheter in place with return of pink to pink-tinged efflux.  I was able to evacuate approximately 5 cc of clot material from the bladder and the urine cleared quickly and easily.  Foley catheter irrigated easily and was draining well upon the conclusion of the procedure.  I did not need to upsize the catheter.  Additionally, patient's foreskin was retracted at the time of my arrival.  Patient reports he is not circumcised and had some penile pain.  Foreskin reduced without difficulty and patient reported improvement in penile pain.  Continue recommendations per my morning note.  Okay for discharge from the urologic perspective.

## 2021-03-26 NOTE — Care Management Important Message (Signed)
Important Message  Patient Details  Name: David Henry MRN: [id number] Date of Birth: [birth year]   Medicare Important Message Given:  Yes     Dannette Barbara 03/26/2021, 11:15 AM

## 2021-03-26 NOTE — Plan of Care (Signed)
Pt Axox4. Calm and cooperative and able to voice his needs. Prn pain med adm for back pain adm and effective. Urine remains dark red. This RN had to flush foley once due to decreased output earlier in the evening, but output has picked up since then. Vitals stable. Safety measures in place. Will continue to monitor.  Problem: Education: Goal: Knowledge of General Education information will improve Description: Including pain rating scale, medication(s)/side effects and non-pharmacologic comfort measures Outcome: Progressing   Problem: Health Behavior/Discharge Planning: Goal: Ability to manage health-related needs will improve Outcome: Progressing   Problem: Clinical Measurements: Goal: Ability to maintain clinical measurements within normal limits will improve Outcome: Progressing Goal: Will remain free from infection Outcome: Progressing Goal: Diagnostic test results will improve Outcome: Progressing Goal: Respiratory complications will improve Outcome: Progressing Goal: Cardiovascular complication will be avoided Outcome: Progressing   Problem: Activity: Goal: Risk for activity intolerance will decrease Outcome: Progressing   Problem: Nutrition: Goal: Adequate nutrition will be maintained Outcome: Progressing   Problem: Coping: Goal: Level of anxiety will decrease Outcome: Progressing   Problem: Elimination: Goal: Will not experience complications related to bowel motility Outcome: Progressing Goal: Will not experience complications related to urinary retention Outcome: Progressing   Problem: Pain Managment: Goal: General experience of comfort will improve Outcome: Progressing   Problem: Safety: Goal: Ability to remain free from injury will improve Outcome: Progressing   Problem: Skin Integrity: Goal: Risk for impaired skin integrity will decrease Outcome: Progressing

## 2021-03-27 ENCOUNTER — Telehealth: Payer: Self-pay | Admitting: Family Medicine

## 2021-03-27 DIAGNOSIS — N183 Chronic kidney disease, stage 3 unspecified: Secondary | ICD-10-CM

## 2021-03-27 LAB — GLUCOSE, CAPILLARY
Glucose-Capillary: 109 mg/dL — ABNORMAL HIGH (ref 70–99)
Glucose-Capillary: 163 mg/dL — ABNORMAL HIGH (ref 70–99)
Glucose-Capillary: 158 mg/dL — ABNORMAL HIGH (ref 70–99)
Glucose-Capillary: 101 mg/dL — ABNORMAL HIGH (ref 70–99)

## 2021-03-27 LAB — CBC
HCT: 29.7 % — ABNORMAL LOW (ref 39.0–52.0)
Hemoglobin: 10 g/dL — ABNORMAL LOW (ref 13.0–17.0)
MCH: 33.9 pg (ref 26.0–34.0)
MCHC: 33.7 g/dL (ref 30.0–36.0)
MCV: 100.7 fL — ABNORMAL HIGH (ref 80.0–100.0)
Platelets: 187 10*3/uL (ref 150–400)
RBC: 2.95 MIL/uL — ABNORMAL LOW (ref 4.22–5.81)
RDW: 14 % (ref 11.5–15.5)
WBC: 12.8 10*3/uL — ABNORMAL HIGH (ref 4.0–10.5)
nRBC: 0 % (ref 0.0–0.2)

## 2021-03-27 LAB — BASIC METABOLIC PANEL
Anion gap: 7 (ref 5–15)
BUN: 45 mg/dL — ABNORMAL HIGH (ref 8–23)
CO2: 23 mmol/L (ref 22–32)
Calcium: 9.1 mg/dL (ref 8.9–10.3)
Chloride: 108 mmol/L (ref 98–111)
Creatinine, Ser: 2.41 mg/dL — ABNORMAL HIGH (ref 0.61–1.24)
GFR, Estimated: 25 mL/min — ABNORMAL LOW (ref >60)
Glucose, Bld: 117 mg/dL — ABNORMAL HIGH (ref 70–99)
Potassium: 4.1 mmol/L (ref 3.5–5.1)
Sodium: 138 mmol/L (ref 135–145)

## 2021-03-27 LAB — FERRITIN: Ferritin: 201 ng/mL (ref 24–336)

## 2021-03-27 LAB — FOLATE: Folate: 20.2 ng/mL (ref >5.9)

## 2021-03-27 LAB — RETICULOCYTES
Immature Retic Fract: 25.2 % — ABNORMAL HIGH (ref 2.3–15.9)
RBC.: 2.93 MIL/uL — ABNORMAL LOW (ref 4.22–5.81)
Retic Count, Absolute: 57.7 10*3/uL (ref 19.0–186.0)
Retic Ct Pct: 2 % (ref 0.4–3.1)

## 2021-03-27 LAB — IRON AND TIBC
Iron: 33 ug/dL — ABNORMAL LOW (ref 45–182)
Saturation Ratios: 21 % (ref 17.9–39.5)
TIBC: 160 ug/dL — ABNORMAL LOW (ref 250–450)
UIBC: 127 ug/dL

## 2021-03-27 LAB — VITAMIN B12: Vitamin B-12: 1142 pg/mL — ABNORMAL HIGH (ref 180–914)

## 2021-03-27 MED ORDER — SODIUM CHLORIDE 0.9 % IV SOLN: Freq: Once | INTRAVENOUS | Status: AC

## 2021-03-27 MED ORDER — SODIUM CHLORIDE 0.9 % IV SOLN
510.0000 mg | Freq: Once | INTRAVENOUS | Status: AC
  Administered 2021-03-27: 510 mg via INTRAVENOUS
  Filled 2021-03-27: qty 17

## 2021-03-27 NOTE — Progress Notes (Signed)
PROGRESS NOTE    [facility]  GXQ:[id number] DOB: [birth year] DOA: 03/22/2021 PCP: Abner Greenspan, MD   Brief Narrative: Taken from prior notes. David HERNAN is a 87 y.o. male with a history of hypertension, CAD status post PCI, mitral sufficiency, hyperlipidemia, CKD stage IIIb, bilateral carotid artery stenosis, diabetes mellitus type 2, hypothyroidism, peripheral vascular disease, BPH.  Patient presented secondary to severe bilateral flank pain and found to have ureteral calculus with associated hydronephrosis and hydroureter.  Urology consulted. Ureteral stent placed with development of hematuria. Also with acute urinary retention s/p foley.  Subjective: Patient has seen and examined today.  No new complaint.  Continues to have some hematuria.  Denies any pain.  Daughter and wife at bedside.  They have some concern regarding his diabetes medication as he was experiencing hypoglycemia at home.  Assessment & Plan:   Principal Problem:   Nephrolithiasis Active Problems:   Controlled type 2 diabetes mellitus with chronic kidney disease (HCC)   Hyperlipidemia associated with type 2 diabetes mellitus ([place])   Essential hypertension   Coronary atherosclerosis   Hypothyroid   Chronic kidney disease (CKD)   AKI (acute kidney injury) ([place])  Nephrolithiasis /Ureteral calculus.  S/p ureteroscopy with stone removal and ureteral stent placement by urology.  Continue to have some hematuria.  Dysuria has been improved. Urine culture without any growth. -Continue ceftriaxone to complete a total of 5-day course.  Hematuria.  Most likely with stent irritation.  Hemoglobin at 10 today. -Continue bladder irrigation as needed. -Discontinue aspirin and heparin -Monitor hemoglobin  Anemia.  Hemoglobin dropped to 10 with baseline around 12-13.  Having significant hematuria.  Anemia panel consistent with anemia of chronic disease with some iron deficiency. -Give him 1 dose of Feraheme. -Will be  discharged home on iron supplement. -Monitor hemoglobin -Transfuse if below 7  AKI with CKD stage IIIb.  Baseline creatinine around 1.7.  Creatinine with some improvement at 2.41 today.  Concern of urinary retention. -Continue with Foley catheter-he will be discharged with Foley and follow-up with urology as an outpatient. -Monitor renal function -Avoid nephrotoxins -Gentle IV hydration -Encourage p.o. hydration  Type 2 diabetes mellitus. Patient is on Lantus, Januvia, glimepiride as an outpatient. Last hemoglobin A1C of 6.8% from 01/10/21.  CBG remained within goal. -Continue Lantus and SSI. -Continue Tradjenta -We will discontinue glimepiride on discharge as concern of hypoglycemia at home.  Hypothyroidism -Continue Synthroid 50 mcg daily  Hyperlipidemia CAD s/p PCI -Continue Crestor  Primary hypertension.  Blood pressure mildly elevated. Patient is on lisinopril as an outpatient. Lisinopril held secondary to mild AKI -hydralazine prn  Constipation -Miralax BID -Colace BID   Objective: Vitals:   03/27/21 0738 03/27/21 1156 03/27/21 1331 03/27/21 1513  BP: 132/60 132/62 (!) 156/65 (!) 155/65  Pulse: 81 79 85 85  Resp: 16 16 18    Temp: 99.2 F (37.3 C) 99 F (37.2 C) 98.8 F (37.1 C) 98 F (36.7 C)  TempSrc:  Oral    SpO2: 96% 96% 96% 97%  Weight:      Height:        Intake/Output Summary (Last 24 hours) at 03/27/2021 1747 Last data filed at 03/27/2021 1353 Gross per 24 hour  Intake 700 ml  Output 550 ml  Net 150 ml   Filed Weights   03/24/21 0500 03/25/21 0500 03/27/21 0500  Weight: 70.8 kg 67.7 kg 74.2 kg    Examination:  General exam: Appears calm and comfortable  Respiratory system: Clear to auscultation.  Respiratory effort normal. Cardiovascular system: S1 & S2 heard, RRR. No JVD, murmurs, rubs, gallops or clicks. Gastrointestinal system: Soft, nontender, nondistended, bowel sounds positive. Central nervous system: Alert and oriented. No focal  neurological deficits. Extremities: No edema, no cyanosis, pulses intact and symmetrical. Psychiatry: Judgement and insight appear normal. Mood & affect appropriate.    DVT prophylaxis: SCDs Code Status: Full Family Communication: Wife and daughter were updated at bedside Disposition Plan:  Status is: Inpatient  Remains inpatient appropriate because:Inpatient level of care appropriate due to severity of illness   Dispo: The patient is from: Home              Anticipated d/c is to: Home              Patient currently is not medically stable to d/c.   Difficult to place patient No             Level of care: Med-Surg  All the records are reviewed and case discussed with Care Management/Social Worker. Management plans discussed with the patient, nursing and they are in agreement.  Consultants:   Urology  Procedures:  Antimicrobials:   Data Reviewed: I have personally reviewed following labs and imaging studies  CBC: Recent Labs  Lab 03/23/21 0230 03/24/21 0625 03/25/21 0445 03/26/21 0550 03/27/21 0440  WBC 12.1* 16.4* 21.0* 17.7* 12.8*  HGB 11.6* 10.6* 11.1* 10.6* 10.0*  HCT 35.0* 31.4* 32.9* 31.5* 29.7*  MCV 101.2* 102.3* 101.9* 101.0* 100.7*  PLT 176 161 184 175 449   Basic Metabolic Panel: Recent Labs  Lab 03/23/21 0230 03/24/21 0625 03/25/21 0445 03/26/21 0551 03/27/21 0440  NA 137 139 139 137 138  K 4.6 4.7 4.3 4.2 4.1  CL 108 109 110 106 108  CO2 23 23 21* 23 23  GLUCOSE 148* 120* 151* 126* 117*  BUN 29* 34* 37* 41* 45*  CREATININE 2.06*  2.05* 2.77* 2.37* 2.54* 2.41*  CALCIUM 9.0 9.1 9.1 9.2 9.1   GFR: Estimated Creatinine Clearance: 18.9 mL/min (A) (by C-G formula based on SCr of 2.41 mg/dL (H)). Liver Function Tests: Recent Labs  Lab 03/22/21 1414  AST 25  ALT 26  ALKPHOS 78  BILITOT 1.3*  PROT 7.1  ALBUMIN 4.2   Recent Labs  Lab 03/22/21 1414  LIPASE 29   No results for input(s): AMMONIA in the last 168 hours. Coagulation  Profile: No results for input(s): INR, PROTIME in the last 168 hours. Cardiac Enzymes: No results for input(s): CKTOTAL, CKMB, CKMBINDEX, TROPONINI in the last 168 hours. BNP (last 3 results) No results for input(s): PROBNP in the last 8760 hours. HbA1C: No results for input(s): HGBA1C in the last 72 hours. CBG: Recent Labs  Lab 03/26/21 1557 03/26/21 2100 03/27/21 0755 03/27/21 1143 03/27/21 1614  GLUCAP 179* 180* 109* 163* 158*   Lipid Profile: No results for input(s): CHOL, HDL, LDLCALC, TRIG, CHOLHDL, LDLDIRECT in the last 72 hours. Thyroid Function Tests: No results for input(s): TSH, T4TOTAL, FREET4, T3FREE, THYROIDAB in the last 72 hours. Anemia Panel: Recent Labs    03/27/21 0440 03/27/21 0942  VITAMINB12  --  1,142*  FOLATE 20.2  --   FERRITIN 201  --   TIBC 160*  --   IRON 33*  --   RETICCTPCT 2.0  --    Sepsis Labs: No results for input(s): PROCALCITON, LATICACIDVEN in the last 168 hours.  Recent Results (from the past 240 hour(s))  Urine culture     Status: None  Collection Time: 03/22/21  3:23 PM   Specimen: Urine, Random  Result Value Ref Range Status   Specimen Description   Final    URINE, RANDOM Performed at [facility], [address] [place], [place] [address]    Special Requests   Final    NONE Performed at [facility], [address] [place], [place] [address]    Culture   Final    NO GROWTH Performed at [facility], [place] [address] [place], [place] [address]    Report Status 03/24/2021 FINAL  Final  Resp Panel by RT-PCR (Flu A&B, Covid) Nasopharyngeal Swab     Status: None   Collection Time: 03/22/21 10:55 PM   Specimen: Nasopharyngeal Swab; Nasopharyngeal(NP) swabs in vial transport medium  Result Value Ref Range Status   SARS Coronavirus 2 by RT PCR NEGATIVE NEGATIVE Final    Comment: (NOTE) SARS-CoV-2 target nucleic acids are NOT DETECTED.  The SARS-CoV-2 RNA is generally detectable in upper  respiratory specimens during the acute phase of infection. The lowest concentration of SARS-CoV-2 viral copies this assay can detect is 138 copies/mL. A negative result does not preclude SARS-Cov-2 infection and should not be used as the sole basis for treatment or other patient management decisions. A negative result may occur with  improper specimen collection/handling, submission of specimen other than nasopharyngeal swab, presence of viral mutation(s) within the areas targeted by this assay, and inadequate number of viral copies(<138 copies/mL). A negative result must be combined with clinical observations, patient history, and epidemiological information. The expected result is Negative.  Fact Sheet for Patients:  EntrepreneurPulse.com.au  Fact Sheet for Healthcare Providers:  IncredibleEmployment.be  This test is no t yet approved or cleared by the [place] FDA and  has been authorized for detection and/or diagnosis of SARS-CoV-2 by FDA under an Emergency Use Authorization (EUA). This EUA will remain  in effect (meaning this test can be used) for the duration of the COVID-19 declaration under Section 564(b)(1) of the Act, 21 U.S.C.section 360bbb-3(b)(1), unless the authorization is terminated  or revoked sooner.       Influenza A by PCR NEGATIVE NEGATIVE Final   Influenza B by PCR NEGATIVE NEGATIVE Final    Comment: (NOTE) The Xpert Xpress SARS-CoV-2/FLU/RSV plus assay is intended as an aid in the diagnosis of influenza from Nasopharyngeal swab specimens and should not be used as a sole basis for treatment. Nasal washings and aspirates are unacceptable for Xpert Xpress SARS-CoV-2/FLU/RSV testing.  Fact Sheet for Patients: EntrepreneurPulse.com.au  Fact Sheet for Healthcare Providers: IncredibleEmployment.be  This test is not yet approved or cleared by the [place] FDA and has been  authorized for detection and/or diagnosis of SARS-CoV-2 by FDA under an Emergency Use Authorization (EUA). This EUA will remain in effect (meaning this test can be used) for the duration of the COVID-19 declaration under Section 564(b)(1) of the Act, 21 U.S.C. section 360bbb-3(b)(1), unless the authorization is terminated or revoked.  Performed at [facility], [address] [place], [place] [address]   MRSA PCR Screening     Status: None   Collection Time: 03/24/21  3:20 AM   Specimen: Nasopharyngeal  Result Value Ref Range Status   MRSA by PCR NEGATIVE NEGATIVE Final    Comment:        The GeneXpert MRSA Assay (FDA approved for NASAL specimens only), is one component of a comprehensive MRSA colonization surveillance program. It is not intended to diagnose MRSA infection nor  to guide or monitor treatment for MRSA infections. Performed at [facility], [address], [place], [place] [address]      Radiology Studies: No results found.  Scheduled Meds: . aspirin EC  81 mg Oral Daily  . Chlorhexidine Gluconate Cloth  6 each Topical Q2200  . docusate sodium  100 mg Oral BID  . heparin  5,000 Units Subcutaneous Q8H  . insulin aspart  0-9 Units Subcutaneous TID WC  . insulin glargine  15 Units Subcutaneous q morning  . levothyroxine  50 mcg Oral Q0600  . linagliptin  5 mg Oral Daily  . multivitamin with minerals  1 tablet Oral Daily  . NON FORMULARY   Oral Daily  . pantoprazole  40 mg Oral Daily  . polyethylene glycol  17 g Oral BID  . rosuvastatin  5 mg Oral QODAY  . senna  1 tablet Oral BID  . sodium chloride flush  3 mL Intravenous Q12H  . tamsulosin  0.4 mg Oral Daily   Continuous Infusions: . cefTRIAXone (ROCEPHIN)  IV 1 g (03/27/21 0906)     LOS: 4 days   Time spent: 40 minutes. More than 50% of the time was spent in counseling/coordination of care  Lorella Nimrod, MD [facility]  If 7PM-7AM, please contact  night-coverage [email or web address]  03/27/2021, 5:47 PM   This record has been created using Systems analyst. Errors have been sought and corrected,but may not always be located. Such creation errors do not reflect on the standard of care.

## 2021-03-27 NOTE — Telephone Encounter (Signed)
I don't have a discharge summary yet (won't until after d/c)  What med changed?  What has changed/is he having trouble with )  Thanks

## 2021-03-27 NOTE — Plan of Care (Signed)
Pt David Henry. Calm and cooperative and able to voice his needs. Prn pain med adm for back pain and c/o of abdominal pain adm and effective. Foley with continued dark red urine without clots noted. Pt encouraged to turn frequently on his side to avoid pressure to sacra/back area. Foam dressing applied as well. No NV reported. Vitals stable. Safety measures in place. Will continue to monitor.   Problem: Education: Goal: Knowledge of General Education information will improve Description: Including pain rating scale, medication(s)/side effects and non-pharmacologic comfort measures Outcome: Progressing   Problem: Health Behavior/Discharge Planning: Goal: Ability to manage health-related needs will improve Outcome: Progressing   Problem: Clinical Measurements: Goal: Ability to maintain clinical measurements within normal limits will improve Outcome: Progressing Goal: Will remain free from infection Outcome: Progressing Goal: Diagnostic test results will improve Outcome: Progressing Goal: Respiratory complications will improve Outcome: Progressing Goal: Cardiovascular complication will be avoided Outcome: Progressing   Problem: Activity: Goal: Risk for activity intolerance will decrease Outcome: Progressing   Problem: Nutrition: Goal: Adequate nutrition will be maintained Outcome: Progressing   Problem: Coping: Goal: Level of anxiety will decrease Outcome: Progressing   Problem: Elimination: Goal: Will not experience complications related to bowel motility Outcome: Progressing Goal: Will not experience complications related to urinary retention Outcome: Progressing   Problem: Pain Managment: Goal: General experience of comfort will improve Outcome: Progressing   Problem: Safety: Goal: Ability to remain free from injury will improve Outcome: Progressing   Problem: Skin Integrity: Goal: Risk for impaired skin integrity will decrease Outcome: Progressing

## 2021-03-27 NOTE — Telephone Encounter (Signed)
[name] called in due to her husband David Henry is being discharged tomorrow and wanted to know if he can get a referral to see a endocrinologist. Due to they are changing his medication at the hospital and wanted to be seen right away. And the Dr. Lynnell Chad @ [facility] an d the fax number is  [phone]

## 2021-03-27 NOTE — Telephone Encounter (Signed)
Left VM requesting pt to call the office back 

## 2021-03-28 ENCOUNTER — Telehealth: Payer: Self-pay

## 2021-03-28 LAB — BASIC METABOLIC PANEL
Anion gap: 10 (ref 5–15)
BUN: 44 mg/dL — ABNORMAL HIGH (ref 8–23)
CO2: 23 mmol/L (ref 22–32)
Calcium: 8.7 mg/dL — ABNORMAL LOW (ref 8.9–10.3)
Chloride: 109 mmol/L (ref 98–111)
Creatinine, Ser: 2.02 mg/dL — ABNORMAL HIGH (ref 0.61–1.24)
GFR, Estimated: 30 mL/min — ABNORMAL LOW (ref >60)
Glucose, Bld: 81 mg/dL (ref 70–99)
Potassium: 3.9 mmol/L (ref 3.5–5.1)
Sodium: 142 mmol/L (ref 135–145)

## 2021-03-28 LAB — CBC
HCT: 29.9 % — ABNORMAL LOW (ref 39.0–52.0)
Hemoglobin: 10.3 g/dL — ABNORMAL LOW (ref 13.0–17.0)
MCH: 34.1 pg — ABNORMAL HIGH (ref 26.0–34.0)
MCHC: 34.4 g/dL (ref 30.0–36.0)
MCV: 99 fL (ref 80.0–100.0)
Platelets: 207 10*3/uL (ref 150–400)
RBC: 3.02 MIL/uL — ABNORMAL LOW (ref 4.22–5.81)
RDW: 13.9 % (ref 11.5–15.5)
WBC: 10 10*3/uL (ref 4.0–10.5)
nRBC: 0 % (ref 0.0–0.2)

## 2021-03-28 LAB — GLUCOSE, CAPILLARY: Glucose-Capillary: 80 mg/dL (ref 70–99)

## 2021-03-28 MED ORDER — AMLODIPINE BESYLATE 5 MG PO TABS
5.0000 mg | ORAL_TABLET | Freq: Every day | ORAL | Status: DC
  Administered 2021-03-28: 5 mg via ORAL
  Filled 2021-03-28: qty 1

## 2021-03-28 MED ORDER — AMLODIPINE BESYLATE 5 MG PO TABS: 5.0000 mg | ORAL_TABLET | Freq: Every day | ORAL | 1 refills | Status: DC

## 2021-03-28 MED ORDER — FERROUS SULFATE 325 (65 FE) MG PO TBEC: 325.0000 mg | DELAYED_RELEASE_TABLET | Freq: Two times a day (BID) | ORAL | 3 refills | Status: DC

## 2021-03-28 NOTE — Telephone Encounter (Signed)
Transition Care Management Follow-up Telephone Call  Date of discharge and from where: 03/28/2021, [facility]  How have you been since you were released from the hospital? Spoke with patient's wife (HIPAA verified) Patient is doing much better. No complaints at this time.   Any questions or concerns? No  Items Reviewed:  Did the pt receive and understand the discharge instructions provided? Yes   Medications obtained and verified? Yes   Other? No   Any new allergies since your discharge? No   Dietary orders reviewed? Yes  Do you have support at home? Yes   Home Care and Equipment/Supplies: Were home health services ordered? not applicable If so, what is the name of the agency? N/A  Has the agency set up a time to come to the patient's home? not applicable Were any new equipment or medical supplies ordered?  No What is the name of the medical supply agency? N/A Were you able to get the supplies/equipment? not applicable Do you have any questions related to the use of the equipment or supplies? No  Functional Questionnaire: (I = Independent and D = Dependent) ADLs: I  Bathing/Dressing- I  Meal Prep- I  Eating- I  Maintaining continence- I  Transferring/Ambulation- I  Managing Meds- I  Follow up appointments reviewed:   PCP Hospital f/u appt confirmed? Yes  Scheduled to see Dr. Glori Bickers on 04/04/2021 @ 9:45 am.  [facility] f/u appt confirmed? Yes  Scheduled to see urology on 04/03/2021 @ 9:15 am.  Are transportation arrangements needed? No   If their condition worsens, is the pt aware to call PCP or go to the Emergency Dept.? Yes  Was the patient provided with contact information for the PCP's office or ED? Yes  Was to pt encouraged to call back with questions or concerns? Yes

## 2021-03-28 NOTE — Addendum Note (Signed)
Addended by: Loura Pardon A on: 03/28/2021 12:39 PM   Modules accepted: Orders

## 2021-03-28 NOTE — Discharge Summary (Signed)
Physician Discharge Summary  David Henry MWN:[id number] DOB: March 28, 1926 DOA: 03/22/2021  PCP: Abner Greenspan, MD  Admit date: 03/22/2021 Discharge date: 03/28/2021  Admitted From: Home Disposition:  Home  Recommendations for Outpatient Follow-up:  1. Follow up with PCP in 1-2 weeks 2. Please obtain BMP/CBC in one week 3. Please follow up on the following pending results: None 4. Encourage patient for better p.o. hydration  Home Health: No Equipment/Devices: Foley catheter Discharge Condition: Stable CODE STATUS: Full Diet recommendation: Heart Healthy / Carb Modified   Brief/Interim Summary: David Henry a 87 y.o.male with a history of hypertension, CAD status post PCI, mitral sufficiency, hyperlipidemia, CKD stage IIIb, bilateral carotid artery stenosis, diabetes mellitus type 2, hypothyroidism, peripheral vascular disease, BPH. Patient presented secondary to severe bilateral flank pain and found to have ureteral calculus with associated hydronephrosis and hydroureter. Urology consulted.  S/p ureteroscopy with stone removal and ureteral stent placement by urology.  Continue to have some hematuria.  Dysuria has been improved. Urine culture without any growth.  Completed a 5-day course of ceftriaxone. He was discharged with Foley catheter and will follow up with urology for further recommendations.  Patient developed hematuria after the procedure.  Hemoglobin dropped up to 10 and then started improving and stable before discharge.  Most likely secondary to stent irritation.  Anemia panel consistent with anemia of chronic disease with some iron deficiency.  He received 1 dose of Feraheme and discharged on iron supplement.  Patient did required frequent bladder irrigation during hospitalization.  We discontinued his aspirin and it can be resumed later if needed by his primary care provider.  Patient also developed AKI with CKD stage IIIb.  Baseline creatinine around 1.7.  Hemoglobin  improved slowly with gentle IV fluid and it was 2.02 on discharge.  We held his home dose of lisinopril initially due to AKI and then started him on amlodipine which he will continue after discharge.  Patient had very well controlled diabetes with A1c of 6.8.  Having some intermittent hypoglycemia.  His home dose of Amaryl was discontinued and he will continue with Januvia and Lantus.  Instructed to frequently check his blood glucose level and back off on Lantus if continue to experience hypoglycemia.  He will continue with rest of his home medications and follow-up with his providers.  Discharge Diagnoses:  Principal Problem:   Nephrolithiasis Active Problems:   Controlled type 2 diabetes mellitus with chronic kidney disease ([place])   Hyperlipidemia associated with type 2 diabetes mellitus ([place])   Essential hypertension   Coronary atherosclerosis   Hypothyroid   Chronic kidney disease (CKD)   AKI (acute kidney injury) [facility])   Discharge Instructions  Discharge Instructions    Diet - low sodium heart healthy   Complete by: As directed    Discharge instructions   Complete by: As directed    It was pleasure taking care of you. I discontinued your Amaryl, you can continue Lantus and Januvia for your diabetes.  Please check your blood glucose level regularly and if you see any low levels then you can decrease the dose of Lantus. We also made some changes to your blood pressure medication, I discontinued lisinopril and started you on amlodipine as your kidney functions were little worse than your baseline, it is very important that you keep yourself well-hydrated to continue improvement in your kidney functions. Continue holding your aspirin for another week and then you can restart it if there is no more blood in your urine.  On your med rec it is being continued. Follow-up with urology and your primary care doctor.   Increase activity slowly   Complete by: As directed      Allergies as  of 03/28/2021      Reactions   Adhesive [tape] Dermatitis   Skin is thin so it is tough to take off.  Use paper tape   Azithromycin Nausea Only   REACTION: nausea   Celecoxib Other (See Comments)   Raises blood pressure   Zocor [simvastatin] Other (See Comments)   Muscle pain      Medication List    STOP taking these medications   glimepiride 4 MG tablet Commonly known as: AMARYL   lisinopril 20 MG tablet Commonly known as: ZESTRIL     TAKE these medications   acetaminophen 650 MG CR tablet Commonly known as: TYLENOL Take 650 mg by mouth every 8 (eight) hours as needed.   amLODipine 5 MG tablet Commonly known as: NORVASC Take 1 tablet (5 mg total) by mouth daily.   aspirin 81 MG tablet Take 81 mg by mouth daily.   ferrous sulfate 325 (65 FE) MG EC tablet Take 1 tablet (325 mg total) by mouth 2 (two) times daily.   Lantus SoloStar 100 UNIT/ML Solostar Pen Generic drug: insulin glargine INJECT 15 UNITS INTO THE SKIN EVERY MORNING   levothyroxine 50 MCG tablet Commonly known as: SYNTHROID Take 1 tablet (50 mcg total) by mouth daily before breakfast.   multivitamin capsule Take 1 capsule by mouth daily.   NON FORMULARY 100 BC ultra fine needles   ondansetron 4 MG disintegrating tablet Commonly known as: Zofran ODT Take 1 tablet (4 mg total) by mouth every 8 (eight) hours as needed for nausea or vomiting.   OneTouch Ultra test strip Generic drug: glucose blood CHECK BLOOD SUGAR TWICE DAILY AND AS DIRECTED FROM DM   oxyCODONE 5 MG immediate release tablet Commonly known as: Roxicodone Take 1 tablet (5 mg total) by mouth every 8 (eight) hours as needed.   pantoprazole 40 MG tablet Commonly known as: PROTONIX Take 1 tablet (40 mg total) by mouth daily.   rosuvastatin 5 MG tablet Commonly known as: CRESTOR Take 1 tablet (5 mg total) by mouth every other day.   senna 8.6 MG tablet Commonly known as: SENOKOT Take 1 tablet by mouth 2 (two) times daily.    sitaGLIPtin 50 MG tablet Commonly known as: Januvia Take 1 tablet (50 mg total) by mouth daily.   tamsulosin 0.4 MG Caps capsule Commonly known as: FLOMAX Take 1 capsule (0.4 mg total) by mouth daily for 14 days.   UltiCare Micro Pen Needles 32G X 4 MM Misc Generic drug: Insulin Pen Needle USE AS DIRECTED WITH LANTUS       Follow-up Information    Schedule an appointment as soon as possible for a visit  with [name], MD.   Specialty: Urology Contact information: [address] [place] [address] [phone]        Abner Greenspan, MD. Schedule an appointment as soon as possible for a visit in 1 week(s).   Specialties: Family Medicine, Radiology Contact information: [place] [address] [phone]              Allergies  Allergen Reactions  . Adhesive [Tape] Dermatitis    Skin is thin so it is tough to take off.  Use paper tape  . Azithromycin Nausea Only  REACTION: nausea  . Celecoxib Other (See Comments)    Raises blood pressure  . Zocor [Simvastatin] Other (See Comments)    Muscle pain    Consultations:  Urology  Procedures/Studies: CT ABDOMEN PELVIS WO CONTRAST  Result Date: 03/25/2021 CLINICAL DATA:  Nausea and vomiting, abdominal distension, cystoscopy yesterday for stone, midline abdominal pain without flank pain. History type II diabetes mellitus, hypertension, kidney stones, coronary artery disease, peripheral vascular disease EXAM: CT ABDOMEN AND PELVIS WITHOUT CONTRAST TECHNIQUE: Multidetector CT imaging of the abdomen and pelvis was performed following the standard protocol without IV contrast. Sagittal and coronal MPR images reconstructed from axial data set. Dilute oral contrast was administered COMPARISON:  03/22/2021 FINDINGS: Lower chest: Small BILATERAL pleural effusions. Bibasilar atelectasis greater on LEFT. Hepatobiliary: Gallbladder surgically absent.  Liver unremarkable. Pancreas:  Markedly atrophic pancreas without mass Spleen: Normal appearance Adrenals/Urinary Tract: Adrenal glands normal appearance. Atrophic RIGHT kidney with small extrarenal pelvis in peripelvic renal cyst. Tiny hyperdense lesion at posterior RIGHT kidney 9 mm diameter unchanged. Large cyst at upper pole LEFT kidney 8.3 x 8.0 cm image 24. LEFT hydronephrosis with ureteral stent extending from renal pelvis to urinary bladder. Bladder well distended, otherwise unremarkable. Mild LEFT perinephric and Peri ureteral edema likely related to interval stent placement. Previously identified distal LEFT ureteral calculus no longer seen. Stomach/Bowel: Sigmoid diverticulosis without evidence of diverticulitis. Gaseous distension of the proximal half of the colon question ileus, RIGHT colon maximum 9.9 cm diameter. Stomach and remaining bowel loops unremarkable. Vascular/Lymphatic: Extensive atherosclerotic calcifications aorta, visceral arteries, iliac arteries, coronary arteries. Aorta normal caliber. No adenopathy. Reproductive: Prostatic enlargement with gland measuring 5.1 x 5.6 x 5.2 cm (volume = 78 cm^3) Other: Small supraumbilical ventral hernia containing fat RIGHT of midline. No free air or free fluid. LEFT inguinal hernia containing fat and small amount of edema. Musculoskeletal: Osseous demineralization. No acute osseous findings. IMPRESSION: LEFT hydronephrosis with ureteral stent extending from renal pelvis to urinary bladder. Previously identified distal LEFT ureteral calculus no longer seen. Atrophic RIGHT kidney with multiple renal cysts as well as a 9 mm hyperdense lesion. Sigmoid diverticulosis without evidence of diverticulitis. Prostatic enlargement. Small BILATERAL pleural effusions and bibasilar atelectasis greater on LEFT. Small supraumbilical and LEFT inguinal hernias containing fat and small amount of edema. Gaseous distension of the proximal half of the colon question ileus, RIGHT colon up to 9.8 cm  diameter. Aortic Atherosclerosis (ICD10-I70.0). Electronically Signed   By: Lavonia Dana M.D.   On: 03/25/2021 15:56   DG Abd 1 View  Result Date: 03/24/2021 CLINICAL DATA:  87 year old male status post cystoscopy and left ureteroscopy with stone removal. EXAM: ABDOMEN - 1 VIEW COMPARISON:  CT abdomen pelvis dated 03/22/2021. FINDINGS: Left ureteral stent with proximal tip in the region of the left renal silhouette and distal end within the urinary bladder. The previously seen stone in the distal left ureter is not identified. No radiopaque calculus noted along the course of the stent. There is moderate amount of stool throughout the colon. There is air in the transverse colon. Right upper quadrant cholecystectomy clips. Degenerative changes of the spine. No acute osseous pathology. IMPRESSION: Left ureteral stent. No radiopaque calculus along the course of the stent. Electronically Signed   By: Anner Crete M.D.   On: 03/24/2021 19:00   DG OR UROLOGY CYSTO IMAGE ([facility] ONLY)  Result Date: 03/24/2021 There is no interpretation for this exam.  This order is for images obtained during a surgical procedure.  Please See "Surgeries" Tab for  more information regarding the procedure.   CT Renal Stone Study  Result Date: 03/22/2021 CLINICAL DATA:  Hematuria flank pain EXAM: CT ABDOMEN AND PELVIS WITHOUT CONTRAST TECHNIQUE: Multidetector CT imaging of the abdomen and pelvis was performed following the standard protocol without IV contrast. COMPARISON:  CT 06/10/2013 FINDINGS: Lower chest: Lung bases demonstrate no acute consolidation or effusion. Mild subpleural fibrosis on the right. Borderline cardiac size. Small pericardial effusion. Small hiatal hernia. Hepatobiliary: No focal liver abnormality is seen. Status post cholecystectomy. No biliary dilatation. Pancreas: Unremarkable. No pancreatic ductal dilatation or surrounding inflammatory changes. Fatty atrophy Spleen: Normal in size without focal abnormality.  Adrenals/Urinary Tract: Adrenal glands are normal. 7.6 cm cyst exophytic to the upper pole left kidney. Subcentimeter cortical hyperdensity mid right kidney without change and likely representing hemorrhagic or proteinaceous cyst. Bilateral parapelvic cysts. Mild to moderate hydronephrosis on the left with hydroureter. This is secondary to a 3 mm stone in the distal left ureter about a cm proximal to the left UVJ. Bladder is nearly empty Stomach/Bowel: Stomach is within normal limits. Status post appendectomy. Extensive sigmoid colon diverticular disease without acute inflammatory change. No evidence of bowel wall thickening, distention, or inflammatory changes. Vascular/Lymphatic: Nonaneurysmal aorta. Moderate aortic atherosclerosis. No suspicious nodes. Reproductive: Enlarged prostate gland Other: Negative for free air or free fluid. Small fat containing left inguinal hernia. Musculoskeletal: Degenerative changes of the spine. No acute osseous abnormality IMPRESSION: 1. Mild to moderate left hydronephrosis and hydroureter secondary to a 3 mm stone in the distal left ureter about a cm proximal to the left UVJ. 2. Extensive sigmoid colon diverticular disease without acute inflammatory change. 3. Multiple bilateral renal cysts including parapelvic cysts. Aortic Atherosclerosis (ICD10-I70.0). Electronically Signed   By: Donavan Foil M.D.   On: 03/22/2021 16:45     Subjective: Patient was seen and examined today.  No new complaint.  Continue to have mild hematuria, urine seems little clearer than yesterday.  Discussed about holding aspirin, changing the blood pressure medication and the importance of keeping himself well-hydrated.  Discharge Exam: Vitals:   03/27/21 1933 03/28/21 0752  BP: (!) 155/63 (!) 142/75  Pulse: 75 68  Resp: 16 18  Temp: 99.3 F (37.4 C) 99 F (37.2 C)  SpO2: 100% 93%   Vitals:   03/27/21 1933 03/28/21 0500 03/28/21 0601 03/28/21 0752  BP: (!) 155/63   (!) 142/75  Pulse: 75    68  Resp: 16   18  Temp: 99.3 F (37.4 C)   99 F (37.2 C)  TempSrc: Oral   Oral  SpO2: 100%   93%  Weight:  75.9 kg 74.9 kg   Height:        General: Pt is alert, awake, not in acute distress Cardiovascular: RRR, S1/S2 +, no rubs, no gallops Respiratory: CTA bilaterally, no wheezing, no rhonchi Abdominal: Soft, NT, ND, bowel sounds + Extremities: no edema, no cyanosis   The results of significant diagnostics from this hospitalization (including imaging, microbiology, ancillary and laboratory) are listed below for reference.    Microbiology: Recent Results (from the past 240 hour(s))  Urine culture     Status: None   Collection Time: 03/22/21  3:23 PM   Specimen: Urine, Random  Result Value Ref Range Status   Specimen Description   Final    URINE, RANDOM Performed at [facility], [address], [place], [place] [address]    Special Requests   Final    NONE Performed at [facility], [phone]  [address], [place], [place] [address]    Culture   Final    NO GROWTH Performed at [facility], [place] [address] [place], [place] [address]    Report Status 03/24/2021 FINAL  Final  Resp Panel by RT-PCR (Flu A&B, Covid) Nasopharyngeal Swab     Status: None   Collection Time: 03/22/21 10:55 PM   Specimen: Nasopharyngeal Swab; Nasopharyngeal(NP) swabs in vial transport medium  Result Value Ref Range Status   SARS Coronavirus 2 by RT PCR NEGATIVE NEGATIVE Final    Comment: (NOTE) SARS-CoV-2 target nucleic acids are NOT DETECTED.  The SARS-CoV-2 RNA is generally detectable in upper respiratory specimens during the acute phase of infection. The lowest concentration of SARS-CoV-2 viral copies this assay can detect is 138 copies/mL. A negative result does not preclude SARS-Cov-2 infection and should not be used as the sole basis for treatment or other patient management decisions. A negative result may occur with  improper specimen collection/handling,  submission of specimen other than nasopharyngeal swab, presence of viral mutation(s) within the areas targeted by this assay, and inadequate number of viral copies(<138 copies/mL). A negative result must be combined with clinical observations, patient history, and epidemiological information. The expected result is Negative.  Fact Sheet for Patients:  EntrepreneurPulse.com.au  Fact Sheet for Healthcare Providers:  IncredibleEmployment.be  This test is no t yet approved or cleared by the [place] FDA and  has been authorized for detection and/or diagnosis of SARS-CoV-2 by FDA under an Emergency Use Authorization (EUA). This EUA will remain  in effect (meaning this test can be used) for the duration of the COVID-19 declaration under Section 564(b)(1) of the Act, 21 U.S.C.section 360bbb-3(b)(1), unless the authorization is terminated  or revoked sooner.       Influenza A by PCR NEGATIVE NEGATIVE Final   Influenza B by PCR NEGATIVE NEGATIVE Final    Comment: (NOTE) The Xpert Xpress SARS-CoV-2/FLU/RSV plus assay is intended as an aid in the diagnosis of influenza from Nasopharyngeal swab specimens and should not be used as a sole basis for treatment. Nasal washings and aspirates are unacceptable for Xpert Xpress SARS-CoV-2/FLU/RSV testing.  Fact Sheet for Patients: EntrepreneurPulse.com.au  Fact Sheet for Healthcare Providers: IncredibleEmployment.be  This test is not yet approved or cleared by the [place] FDA and has been authorized for detection and/or diagnosis of SARS-CoV-2 by FDA under an Emergency Use Authorization (EUA). This EUA will remain in effect (meaning this test can be used) for the duration of the COVID-19 declaration under Section 564(b)(1) of the Act, 21 U.S.C. section 360bbb-3(b)(1), unless the authorization is terminated or revoked.  Performed at [facility], [address] [place], [place] [address]   MRSA PCR Screening     Status: None   Collection Time: 03/24/21  3:20 AM   Specimen: Nasopharyngeal  Result Value Ref Range Status   MRSA by PCR NEGATIVE NEGATIVE Final    Comment:        The GeneXpert MRSA Assay (FDA approved for NASAL specimens only), is one component of a comprehensive MRSA colonization surveillance program. It is not intended to diagnose MRSA infection nor to guide or monitor treatment for MRSA infections. Performed at [facility], [address] [place],  [address]      Labs: BNP (last 3 results) No results for input(s): BNP in the last 8760 hours. Basic Metabolic Panel: Recent Labs  Lab 03/24/21 0625 03/25/21 0445 03/26/21 0551 03/27/21 0440 03/28/21 0336  NA  139 139 137 138 142  K 4.7 4.3 4.2 4.1 3.9  CL 109 110 106 108 109  CO2 23 21* 23 23 23   GLUCOSE 120* 151* 126* 117* 81  BUN 34* 37* 41* 45* 44*  CREATININE 2.77* 2.37* 2.54* 2.41* 2.02*  CALCIUM 9.1 9.1 9.2 9.1 8.7*   Liver Function Tests: Recent Labs  Lab 03/22/21 1414  AST 25  ALT 26  ALKPHOS 78  BILITOT 1.3*  PROT 7.1  ALBUMIN 4.2   Recent Labs  Lab 03/22/21 1414  LIPASE 29   No results for input(s): AMMONIA in the last 168 hours. CBC: Recent Labs  Lab 03/24/21 0625 03/25/21 0445 03/26/21 0550 03/27/21 0440 03/28/21 0336  WBC 16.4* 21.0* 17.7* 12.8* 10.0  HGB 10.6* 11.1* 10.6* 10.0* 10.3*  HCT 31.4* 32.9* 31.5* 29.7* 29.9*  MCV 102.3* 101.9* 101.0* 100.7* 99.0  PLT 161 184 175 187 207   Cardiac Enzymes: No results for input(s): CKTOTAL, CKMB, CKMBINDEX, TROPONINI in the last 168 hours. BNP: Invalid input(s): POCBNP CBG: Recent Labs  Lab 03/27/21 0755 03/27/21 1143 03/27/21 1614 03/27/21 2116 03/28/21 0804  GLUCAP 109* 163* 158* 101* 80   D-Dimer No results for input(s): DDIMER in the last 72 hours. Hgb A1c No results for input(s): HGBA1C in the last 72 hours. Lipid Profile No results  for input(s): CHOL, HDL, LDLCALC, TRIG, CHOLHDL, LDLDIRECT in the last 72 hours. Thyroid function studies No results for input(s): TSH, T4TOTAL, T3FREE, THYROIDAB in the last 72 hours.  Invalid input(s): FREET3 Anemia work up Recent Labs    03/27/21 0440 03/27/21 0942  VITAMINB12  --  1,142*  FOLATE 20.2  --   FERRITIN 201  --   TIBC 160*  --   IRON 33*  --   RETICCTPCT 2.0  --    Urinalysis    Component Value Date/Time   COLORURINE YELLOW (A) 03/22/2021 1523   APPEARANCEUR HAZY (A) 03/22/2021 1523   LABSPEC 1.016 03/22/2021 1523   PHURINE 5.0 03/22/2021 1523   GLUCOSEU NEGATIVE 03/22/2021 1523   GLUCOSEU NEGATIVE 06/23/2013 0822   HGBUR LARGE (A) 03/22/2021 1523   BILIRUBINUR NEGATIVE 03/22/2021 1523   BILIRUBINUR [place] 05/24/2013 0952   KETONESUR NEGATIVE 03/22/2021 1523   PROTEINUR 100 (A) 03/22/2021 1523   UROBILINOGEN 0.2 06/23/2013 0822   NITRITE NEGATIVE 03/22/2021 1523   LEUKOCYTESUR NEGATIVE 03/22/2021 1523   Sepsis Labs Invalid input(s): PROCALCITONIN,  WBC,  LACTICIDVEN Microbiology Recent Results (from the past 240 hour(s))  Urine culture     Status: None   Collection Time: 03/22/21  3:23 PM   Specimen: Urine, Random  Result Value Ref Range Status   Specimen Description   Final    URINE, RANDOM Performed at [facility], [address], [place], [place] [address]    Special Requests   Final    NONE Performed at [facility], [address], [place], [place] [address]    Culture   Final    NO GROWTH Performed at [facility], [place] [address] [place], [place] [address]    Report Status 03/24/2021 FINAL  Final  Resp Panel by RT-PCR (Flu A&B, Covid) Nasopharyngeal Swab     Status: None   Collection Time: 03/22/21 10:55 PM   Specimen: Nasopharyngeal Swab; Nasopharyngeal(NP) swabs in vial transport medium  Result Value Ref Range Status   SARS Coronavirus 2 by RT PCR NEGATIVE NEGATIVE Final    Comment: (NOTE) SARS-CoV-2  target nucleic acids are NOT DETECTED.  The SARS-CoV-2 RNA is generally detectable in upper respiratory specimens during the acute phase of infection. The lowest concentration of SARS-CoV-2 viral copies this assay can detect is 138 copies/mL. A negative result does not preclude SARS-Cov-2 infection and should not be used as the sole basis for treatment or other patient management decisions. A negative result may occur with  improper specimen collection/handling, submission of specimen other than nasopharyngeal swab, presence of viral mutation(s) within the areas targeted by this assay, and inadequate number of viral copies(<138 copies/mL). A negative result must be combined with clinical observations, patient history, and epidemiological information. The expected result is Negative.  Fact Sheet for Patients:  EntrepreneurPulse.com.au  Fact Sheet for Healthcare Providers:  IncredibleEmployment.be  This test is no t yet approved or cleared by the [place] FDA and  has been authorized for detection and/or diagnosis of SARS-CoV-2 by FDA under an Emergency Use Authorization (EUA). This EUA will remain  in effect (meaning this test can be used) for the duration of the COVID-19 declaration under Section 564(b)(1) of the Act, 21 U.S.C.section 360bbb-3(b)(1), unless the authorization is terminated  or revoked sooner.       Influenza A by PCR NEGATIVE NEGATIVE Final   Influenza B by PCR NEGATIVE NEGATIVE Final    Comment: (NOTE) The Xpert Xpress SARS-CoV-2/FLU/RSV plus assay is intended as an aid in the diagnosis of influenza from Nasopharyngeal swab specimens and should not be used as a sole basis for treatment. Nasal washings and aspirates are unacceptable for Xpert Xpress SARS-CoV-2/FLU/RSV testing.  Fact Sheet for Patients: EntrepreneurPulse.com.au  Fact Sheet for Healthcare  Providers: IncredibleEmployment.be  This test is not yet approved or cleared by the [place] FDA and has been authorized for detection and/or diagnosis of SARS-CoV-2 by FDA under an Emergency Use Authorization (EUA). This EUA will remain in effect (meaning this test can be used) for the duration of the COVID-19 declaration under Section 564(b)(1) of the Act, 21 U.S.C. section 360bbb-3(b)(1), unless the authorization is terminated or revoked.  Performed at [facility], [address] [place], [place] [address]   MRSA PCR Screening     Status: None   Collection Time: 03/24/21  3:20 AM   Specimen: Nasopharyngeal  Result Value Ref Range Status   MRSA by PCR NEGATIVE NEGATIVE Final    Comment:        The GeneXpert MRSA Assay (FDA approved for NASAL specimens only), is one component of a comprehensive MRSA colonization surveillance program. It is not intended to diagnose MRSA infection nor to guide or monitor treatment for MRSA infections. Performed at [facility], [address] [place], [place] [address]     Time coordinating discharge: Over 30 minutes  SIGNED:  Lorella Nimrod, MD  [facility] 03/28/2021, 10:14 AM  If 7PM-7AM, please contact night-coverage [email or web address]  This record has been created using Systems analyst. Errors have been sought and corrected,but may not always be located. Such creation errors do not reflect on the standard of care.

## 2021-03-28 NOTE — Telephone Encounter (Signed)
Called pt's wife again but no answer but pt was d/c from hospital today and d/c summary is available, looks like they changed his DM meds and also scheduled him a hospital f/u with PCP on 04/04/21

## 2021-03-28 NOTE — Progress Notes (Signed)
Patient discharging home. Discharge instructions provided to patient and family members. Patient leaving with foley catheter in place. Family taught how to empty foley and verified understanding. Family and patient taught how to change between leg bag and standard drainage bag. Family and patient educated on infection control, provided with CHG wipes and alcohol pads. Patient and family verbalize understanding of all instructions.

## 2021-03-28 NOTE — Telephone Encounter (Signed)
I placed referral Please let her know

## 2021-03-29 LAB — CALCULI, WITH PHOTOGRAPH (CLINICAL LAB)
Calcium Oxalate Dihydrate: 20 %
Calcium Oxalate Monohydrate: 80 %
Weight Calculi: 25 mg

## 2021-04-03 ENCOUNTER — Ambulatory Visit (INDEPENDENT_AMBULATORY_CARE_PROVIDER_SITE_OTHER): Payer: Medicare PPO | Admitting: Urology

## 2021-04-03 ENCOUNTER — Encounter: Payer: Self-pay | Admitting: Urology

## 2021-04-03 ENCOUNTER — Encounter: Payer: Medicare PPO | Admitting: Urology

## 2021-04-03 ENCOUNTER — Other Ambulatory Visit: Payer: Self-pay

## 2021-04-03 VITALS — BP 152/67 | HR 71 | Ht 68.0 in | Wt 155.0 lb

## 2021-04-03 DIAGNOSIS — N200 Calculus of kidney: Secondary | ICD-10-CM

## 2021-04-03 DIAGNOSIS — R339 Retention of urine, unspecified: Secondary | ICD-10-CM

## 2021-04-03 LAB — BLADDER SCAN AMB NON-IMAGING: Scan Result: 42

## 2021-04-03 MED ORDER — LEVOFLOXACIN 500 MG PO TABS
500.0000 mg | ORAL_TABLET | Freq: Once | ORAL | Status: AC
  Administered 2021-04-03: 500 mg via ORAL

## 2021-04-03 NOTE — Progress Notes (Signed)
Indications: Patient is 87 y.o., who is s/p ureteroscopic removal of a 3 mm left distal ureteral calculus on 03/24/2021.  He was admitted 2 days prior for intractable nausea/vomiting to the hospitalist service.  Postoperatively he developed urinary retention with trying Foley catheter placement.  He is feeling much better.  The patient is presenting today for stent removal.  Procedure:  Flexible Cystoscopy with stent removal (17001)  Timeout was performed and the correct patient, procedure and participants were identified.    Description:  The patient was prepped and draped in the usual sterile fashion. Flexible cystosopy was performed.  The stent was visualized, grasped, and removed intact without difficulty. The patient tolerated the procedure well.  A single dose of oral antibiotics was given.  Complications:  None  Plan:  1.  Urinary retention  Voiding trial and he has a follow-up today with La[facility] for PVR  2.  Status post ureteroscopy  To call for flank pain/fever post stent removal   Stone analysis was CaOxMono/CaOxDi 80/20   John Giovanni, MD

## 2021-04-03 NOTE — Progress Notes (Signed)
Catheter Removal  Patient is present today for a catheter removal.  54ml of water was drained from the balloon. A 16 coude foley cath was removed from the bladder no complications were noted . Patient tolerated well.  Performed by: Elberta Leatherwood, CMA  Follow up/ Additional notes: PM PVR  Patient return this afternoon for a PVR. The residual is 51ml

## 2021-04-03 NOTE — Progress Notes (Signed)
He returns this afternoon for PVR.  He states he has been voiding well.  His PVR is 42 mL.  He is advised that he may occasionally see intermittent episodes of gross hematuria with clots, but to contact us if it becomes excessive or he cannot urinate or he starts to pass large clots.  He should also contact the office if he is unable to urinate, has dysuria, fever, chills, nausea or vomiting.  He will return in 1 month for PVR.

## 2021-04-04 ENCOUNTER — Ambulatory Visit: Payer: Medicare PPO | Admitting: Family Medicine

## 2021-04-09 ENCOUNTER — Telehealth: Payer: Self-pay

## 2021-04-09 NOTE — Telephone Encounter (Signed)
See phone note that Anisha RN just started.

## 2021-04-09 NOTE — Telephone Encounter (Signed)
David Henry - Client TELEPHONE ADVICE RECORD [facility] Patient Name: David Henry Gender: Male DOB: 06/20/1926 Age: 87 Y 54 M 4 D Return Phone Number: [phone] (Primary), [phone] (Secondary) [place] [address] Client David Henry - Client Client Site [place] MD Contact Type Call Who Is Calling Patient / Member / Family / Caregiver Call Type Triage / Clinical Caller Name David Henry Relationship To Patient Spouse Return Phone Number [phone] (Primary) Chief Complaint Dizziness Reason for Call Symptomatic / Request for Health Information Initial Comment Caller's husband has been falling frequently. He states he is dizzy. His diabetes medicine has been changed recently. His blood sugar was 263 this morning. The hospital told them they want it to run higher due to his age. Translation No Nurse Assessment Nurse: David Oiler, RN, David Henry Date/Time (Eastern Time): 04/09/2021 3:32:05 PM Confirm and document reason for call. If symptomatic, describe symptoms. ---caller states pt was recently hospitalized (4/6-4/14) d/t renal calculi. while he was in the hospital he had medications switched. since being home, BGL has been higher. pt is unsteady on his feet as well. were told that his A1C was too low, bgl was 236 at lunch. at this time is feeling well. when he stands up he "wobbles" Does the patient have any new or worsening symptoms? ---Yes Will a triage be completed? ---Yes Related visit to physician within the last 2 weeks? ---Yes Does the PT have any chronic conditions? (i.e. diabetes, asthma, this includes High risk factors for pregnancy, etc.) ---Yes List chronic conditions. ---htn diabetes iron deficiency anemia Is this a behavioral health or substance abuse call? ---No Guidelines Guideline Title Affirmed  Question Affirmed Notes Nurse Date/Time (Eastern Time) Weakness (Generalized) and Fatigue [1] MODERATE weakness (i.e., interferes with work, David Henry, Therapist, sports, David Henry 04/09/2021 3:40:02 PM PLEASE NOTE: All timestamps contained within this report are represented as [place] Standard Time. CONFIDENTIALTY NOTICE: This fax transmission is intended only for the addressee. It contains information that is legally privileged, confidential or otherwise protected from use or disclosure. If you are not the intended recipient, you are strictly prohibited from reviewing, disclosing, copying using or disseminating any of this information or taking any action in reliance on or regarding this information. If you have received this fax in error, please notify us immediately by telephone so that we can arrange for its return to [place]. Phone: [phone], Toll-Free: [phone], Fax: [phone] Page: 2 of 3 Call Id: [id number] Guidelines Guideline Title Affirmed Question Affirmed Notes Nurse Date/Time David Henry Time) school, normal activities) AND [2] cause unknown (Exceptions: weakness with acute minor illness, or weakness from poor fluid intake) Diabetes - High Blood Sugar [1] Blood glucose 240 - 300 mg/dL (13.3 - 16.7 mmol/ L) AND [2] uses insulin (e.g., insulindependent, all people with type 1 diabetes) David Oiler, RN, David Henry 04/09/2021 3:46:49 PM Disp. Time David Henry Time) Disposition Final User 04/09/2021 3:41:38 PM See HCP within 4 Hours (or PCP triage) David Oiler, RN, David Henry 04/09/2021 3:48:16 PM Home Care Yes David Oiler, RN, David Henry Caller Disagree/Comply Disagree Caller Understands Yes PreDisposition Call Doctor Care Advice Given Per Guideline SEE HCP (OR PCP TRIAGE) WITHIN 4 HOURS: * IF OFFICE WILL BE OPEN: You need to be seen within the next 3 or 4 hours. Call your doctor (or NP/PA) now or as soon as the office opens. CALL BACK IF: * You become worse CARE ADVICE given per Weakness and Fatigue  (Adult)  guideline. HOME CARE: * You should be able to treat this at home. CARE ADVICE given per Diabetes - High Blood Sugar (Adult) guideline. CALL BACK IF: * You become worse Comments User: David Fantasia, RN Date/Time David Henry Time): 04/09/2021 3:38:06 PM hypothyroid high cholesterol User: David Fantasia, RN Date/Time David Henry Time): 04/09/2021 3:52:54 PM PLEASE NOTE: All timestamps contained within this report are represented as [place] Standard Time. CONFIDENTIALTY NOTICE: This fax transmission is intended only for the addressee. It contains information that is legally privileged, confidential or otherwise protected from use or disclosure. If you are not the intended recipient, you are strictly prohibited from reviewing, disclosing, copying using or disseminating any of this information or taking any action in reliance on or regarding this information. If you have received this fax in error, please notify us immediately by telephone so that we can arrange for its return to [place]. Phone: [phone], Toll-Free: [phone], Fax: [phone] Page: 3 of 3 Call Id: [id number] Comments Per David Henry, no appts available for today. pt refused UC/ED. office staff is aware and a nurse will be calling him back Referrals [place] REFUSED

## 2021-04-09 NOTE — Telephone Encounter (Signed)
Patient called in and stated that he has been having some dizziness when standing up. Patient denies falling, SOB, chest pain, or constant dizziness. Patient stated that he just got out of the hospital at the beginning of this month and they changed some of his medications around and he is concerned. Patient stated that his blood sugars have been running high. His fasting blood sugar this morning was 176. Patient stated they they took him off of his glimepiride and lisinopril and started him on amlodipine. Patient is wanting to talk to Dr. Glori Bickers about these changes. Appointment scheduled for 4/28. Instructed patient that if his symptoms became worse to go to [facility] or ED. Also advised patient to stand up slowly to avoid dizziness. Patient verbalized understanding. Sending to Dr. Glori Bickers.

## 2021-04-09 NOTE — Telephone Encounter (Signed)
Aware, if they can check bp at home that would be helpful also and let us know if it is very low (100/60 or lower), thanks

## 2021-04-10 NOTE — Telephone Encounter (Signed)
Patient notified as instructed by telephone and verbalized understanding. Patient was advised to keep Dr. Glori Bickers updated if no improvement. Patient stated that his daughter checked his blood pressure today and it was okay but he didn't write it down. Patient was advised to continue to monitor his blood pressure and write the numbers down. Patient was advised if he has any problems with blood pressur to the call the office.

## 2021-04-10 NOTE — Telephone Encounter (Signed)
If he has the 4 mg tabs- please ask him to cut in 1/2 and take a half tab (2 mg) bid - then update if no improvement

## 2021-04-10 NOTE — Telephone Encounter (Signed)
Called and spoke to patient and was advised that he was more concern about his blood sugar yesterday because it was 370 after lunch. Patient stated that he ate tuna fish, crackers and fruit. Patient stated that his blood pressure machine is broken but he will get his daughter to go and buy him one and start monitoring it. Patient stated that [place] and is going there to get them to check it for him today. Patient was advised to call the office back if it is low. Patient stated that when he was in the hospital they had him stop his glimepiride 4 mg which he was taking it twice a day. Patient stated that he was told by the doctor at the hospital that his A1C was too low. Patient stated that his fasting blood sugars were getting down to 55. Patient stated that when his sugar was so high yesterday he went ahead last night and took one of the glimepiride and then took another one this morning. Patient stated that his FBS this morning was 76.  Patient stated that they also changed his blood pressure medication to Amlodipine. Patient stated that he is still using his lantus and Januvia.

## 2021-04-15 ENCOUNTER — Other Ambulatory Visit: Payer: Self-pay | Admitting: Family Medicine

## 2021-04-18 ENCOUNTER — Ambulatory Visit: Payer: Medicare PPO | Admitting: Family Medicine

## 2021-04-18 ENCOUNTER — Encounter: Payer: Self-pay | Admitting: Family Medicine

## 2021-04-18 ENCOUNTER — Other Ambulatory Visit: Payer: Self-pay

## 2021-04-18 VITALS — BP 140/62 | HR 70 | Temp 97.5°F | Ht 68.0 in | Wt 157.2 lb

## 2021-04-18 DIAGNOSIS — N183 Chronic kidney disease, stage 3 unspecified: Secondary | ICD-10-CM | POA: Diagnosis not present

## 2021-04-18 DIAGNOSIS — Z794 Long term (current) use of insulin: Secondary | ICD-10-CM | POA: Diagnosis not present

## 2021-04-18 DIAGNOSIS — N200 Calculus of kidney: Secondary | ICD-10-CM | POA: Diagnosis not present

## 2021-04-18 DIAGNOSIS — N179 Acute kidney failure, unspecified: Secondary | ICD-10-CM

## 2021-04-18 DIAGNOSIS — I10 Essential (primary) hypertension: Secondary | ICD-10-CM | POA: Diagnosis not present

## 2021-04-18 DIAGNOSIS — N1832 Chronic kidney disease, stage 3b: Secondary | ICD-10-CM | POA: Diagnosis not present

## 2021-04-18 DIAGNOSIS — D649 Anemia, unspecified: Secondary | ICD-10-CM | POA: Diagnosis not present

## 2021-04-18 DIAGNOSIS — E1122 Type 2 diabetes mellitus with diabetic chronic kidney disease: Secondary | ICD-10-CM

## 2021-04-18 LAB — CBC WITH DIFFERENTIAL/PLATELET
Basophils Absolute: 0.1 10*3/uL (ref 0.0–0.1)
Basophils Relative: 0.9 % (ref 0.0–3.0)
Eosinophils Absolute: 0.1 10*3/uL (ref 0.0–0.7)
Eosinophils Relative: 1.9 % (ref 0.0–5.0)
HCT: 36 % — ABNORMAL LOW (ref 39.0–52.0)
Hemoglobin: 12 g/dL — ABNORMAL LOW (ref 13.0–17.0)
Lymphocytes Relative: 17.5 % (ref 12.0–46.0)
Lymphs Abs: 1.2 10*3/uL (ref 0.7–4.0)
MCHC: 33.2 g/dL (ref 30.0–36.0)
MCV: 102.7 fl — ABNORMAL HIGH (ref 78.0–100.0)
Monocytes Absolute: 0.7 10*3/uL (ref 0.1–1.0)
Monocytes Relative: 9.5 % (ref 3.0–12.0)
Neutro Abs: 5 10*3/uL (ref 1.4–7.7)
Neutrophils Relative %: 70.2 % (ref 43.0–77.0)
Platelets: 257 10*3/uL (ref 150.0–400.0)
RBC: 3.51 Mil/uL — ABNORMAL LOW (ref 4.22–5.81)
RDW: 14.5 % (ref 11.5–15.5)
WBC: 7.1 10*3/uL (ref 4.0–10.5)

## 2021-04-18 LAB — BASIC METABOLIC PANEL
BUN: 26 mg/dL — ABNORMAL HIGH (ref 6–23)
CO2: 27 mEq/L (ref 19–32)
Calcium: 9.1 mg/dL (ref 8.4–10.5)
Chloride: 107 mEq/L (ref 96–112)
Creatinine, Ser: 1.54 mg/dL — ABNORMAL HIGH (ref 0.40–1.50)
GFR: 38.84 mL/min — ABNORMAL LOW (ref >60.00)
Glucose, Bld: 240 mg/dL — ABNORMAL HIGH (ref 70–99)
Potassium: 5 mEq/L (ref 3.5–5.1)
Sodium: 140 mEq/L (ref 135–145)

## 2021-04-18 MED ORDER — GLIMEPIRIDE 4 MG PO TABS: 2.0000 mg | ORAL_TABLET | Freq: Two times a day (BID) | ORAL | 0 refills | Status: DC

## 2021-04-18 NOTE — Assessment & Plan Note (Signed)
Anemia of chronic dz with CKD Cbc today  Had feraheme in the hospital

## 2021-04-18 NOTE — Assessment & Plan Note (Signed)
bp in fair control at this time  BP Readings from Last 1 Encounters:  04/18/21 140/62   No changes needed Most recent labs reviewed  Disc lifstyle change with low sodium diet and exercise  Currently taking amlodipine instead of lisinopril due to elevated cr above baseline with hospital visit Re check bmp today  He has some pedal edema from the amlodipine and would like to return to lisinopril if possible (if renal fxn returns to baseline) He has renal f/u soon also

## 2021-04-18 NOTE — Assessment & Plan Note (Signed)
Lab Results  Component Value Date   HGBA1C 6.7 (H) 03/23/2021   Very well controlled but with hypoglycemia  Explained this is more dangerous than high glucose  The hospital d/c his glimiperide and he became very anxious about high glucose  We agreed on 2 mg bid of this with close supervision and permissible glucose elevation  Diet is returning to normal  Has endocrinology consult planned in June to discuss further  He continues lantus 15 u in am as well  inst to alert [place] if hypoglycemic and what to watch for  Cannot take metformin due to renal insuff utd eye care and takes statin Off ace for now due to renal insuff

## 2021-04-18 NOTE — Progress Notes (Signed)
Subjective:    Patient ID: David Henry, male    DOB: [birth year], 87 y.o.   MRN: [id number]  This visit occurred during the SARS-CoV-2 public health emergency.  Safety protocols were in place, including screening questions prior to the visit, additional usage of staff PPE, and extensive cleaning of exam room while observing appropriate contact time as indicated for disinfecting solutions.    HPI Pt presents for f/u of hospitalization with kidney stone and chronic medical problems including diabetes   Wt Readings from Last 3 Encounters:  04/18/21 157 lb 3 oz (71.3 kg)  04/03/21 155 lb (70.3 kg)  03/28/21 165 lb 2 oz (74.9 kg)   23.90 kg/m   He was hospitalized from 4/1 to 4/7 He presented with flank pain and was found to have a ureteral calculus and hydronephrosis /hydroureter Urology did ureteroscopy/stone removal and stent replacement  He completed 5 d course of cefriiaxone with plan to d/c He then developed hematuria, Hb dropped to 10  Found to have some anemia of chronic dz and give 1 dose of Feraheme and d/c with iron  Noted AKI stage 3b - dr 2.02 on d/c  Held lisinopril Started amlodipine  Noted very well controlled DM2 with a1c of 6.8  Some hypoglycemia  His home dose of amary was d/c for d/c  inst to dec lantus if his glucose became low again   Still some pain in back  Is fairly weak     HTn bp is stable today  No cp or palpitations or headaches or edema  No side effects to medicines  BP Readings from Last 3 Encounters:  04/18/21 140/62  04/03/21 (!) 152/67  03/28/21 (!) 142/75     Pulse Readings from Last 3 Encounters:  04/18/21 70  04/03/21 71  03/28/21 68   Taking amlodipine 5 mg daily   Lab Results  Component Value Date   CREATININE 2.02 (H) 03/28/2021   BUN 44 (H) 03/28/2021   NA 142 03/28/2021   K 3.9 03/28/2021   CL 109 03/28/2021   CO2 23 03/28/2021    DM2 Lab Results  Component Value Date   HGBA1C 6.7 (H) 03/23/2021   januvia  50 mg daily   lantus is taking glimiperide  1/2 in am and 1/2 at night -he was uncomfortable with this (so worried about high blood sugar so he increased it to 1 in the pm)   W/o glimiperide it went into high 200s   Patient Active Problem List   Diagnosis Date Noted  . AKI (acute kidney injury) ([place]) 03/23/2021  . Nephrolithiasis 03/22/2021  . Macrocytosis 10/05/2019  . Routine general medical examination at a health care facility 03/09/2016  . Constipation 09/17/2015  . Encounter for Medicare annual wellness exam 12/08/2014  . Hematuria, microscopic 05/26/2013  . Chronic kidney disease (CKD) 05/24/2013  . Anemia 11/23/2012  . Hypothyroid 11/23/2012  . GERD 11/29/2010  . Coronary atherosclerosis 04/18/2007  . Controlled type 2 diabetes mellitus with chronic kidney disease ([place]) 04/15/2007  . Hyperlipidemia associated with type 2 diabetes mellitus ([place]) 04/15/2007  . Essential hypertension 04/15/2007  . MYOCARDIAL INFARCTION, HX OF 04/15/2007  . ACTINIC KERATOSIS 04/15/2007   Past Medical History:  Diagnosis Date  . Arthritis   . CAD (coronary artery disease)    3 stents  . Colon polyp   . Diabetes mellitus    type II  . Dyspnea    with exertion  . GERD (gastroesophageal reflux disease)   .  Hyperlipidemia   . Hypertension   . Hypothyroidism   . Kidney stones    stage III CKD  . Neuropathy   . Peripheral vascular disease (H[facility])    neuropathy in feet   Past Surgical History:  Procedure Laterality Date  . APPENDECTOMY  1984  . CATARACT EXTRACTION Bilateral 04/2001  . CHOLECYSTECTOMY  1989  . COLONOSCOPY N/A 2/14   hemorrhoids and tics (after pos ifob card)  . CORONARY ANGIOPLASTY  2006   3 stents  . CYSTOSCOPY/URETEROSCOPY/HOLMIUM LASER/STENT PLACEMENT Left 03/24/2021   Procedure: CYSTOSCOPY/URETEROSCOPY, RETROGRADE AND /STENT PLACEMENT;  Surgeon: [name], MD;  Location: [facility];  Service: Urology;  Laterality: Left;  . ESOPHAGOGASTRODUODENOSCOPY N/A 2/14   . RETINAL DETACHMENT SURGERY Right 2003  . SHOULDER ARTHROSCOPY WITH ROTATOR CUFF REPAIR AND SUBACROMIAL DECOMPRESSION Right 12/30/2017   Procedure: SHOULDER ARTHROSCOPY WITH ROTATOR CUFF REPAIR AND SUBACROMIAL DECOMPRESSION;  Surgeon: Leanor Kail, MD;  Location: [facility] ORS;  Service: Orthopedics;  Laterality: Right;  . SHOULDER SURGERY  2012   Lt shoulder repair   Social History   Tobacco Use  . Smoking status: Never Smoker  . Smokeless tobacco: Never Used  Vaping Use  . Vaping Use: Never used  Substance Use Topics  . Alcohol use: No    Alcohol/week: 0.0 standard drinks  . Drug use: No   Family History  Problem Relation Age of Onset  . Cancer Brother        prostate CA  . Heart disease Mother   . Stroke Father   . Kidney disease Sister   . Kidney disease Sister   . Clotting disorder Sister   . COPD Brother   . Heart disease Brother    Allergies  Allergen Reactions  . Adhesive [Tape] Dermatitis    Skin is thin so it is tough to take off.  Use paper tape  . Azithromycin Nausea Only    REACTION: nausea  . Celecoxib Other (See Comments)    Raises blood pressure  . Zocor [Simvastatin] Other (See Comments)    Muscle pain   Current Outpatient Medications on File Prior to Visit  Medication Sig Dispense Refill  . acetaminophen (TYLENOL) 650 MG CR tablet Take 650 mg by mouth every 8 (eight) hours as needed.    Marland Kitchen amLODipine (NORVASC) 5 MG tablet Take 1 tablet (5 mg total) by mouth daily. 30 tablet 1  . aspirin 81 MG tablet Take 81 mg by mouth daily.    . ferrous sulfate 325 (65 FE) MG EC tablet Take 1 tablet (325 mg total) by mouth 2 (two) times daily. 60 tablet 3  . insulin glargine (LANTUS SOLOSTAR) 100 UNIT/ML Solostar Pen INJECT 15 UNITS INTO THE SKIN EVERY MORNING 15 mL 1  . levothyroxine (SYNTHROID) 50 MCG tablet Take 1 tablet (50 mcg total) by mouth daily before breakfast. 90 tablet 1  . Multiple Vitamin (MULTIVITAMIN) capsule Take 1 capsule by mouth daily.    . NON  FORMULARY 100 BC ultra fine needles    . ondansetron (ZOFRAN ODT) 4 MG disintegrating tablet Take 1 tablet (4 mg total) by mouth every 8 (eight) hours as needed for nausea or vomiting. 20 tablet 0  . ONETOUCH ULTRA test strip CHECK BLOOD SUGAR TWICE DAILY AND AS DIRECTED FROM DM 200 each 1  . oxyCODONE (ROXICODONE) 5 MG immediate release tablet Take 1 tablet (5 mg total) by mouth every 8 (eight) hours as needed. 20 tablet 0  . pantoprazole (PROTONIX) 40 MG tablet Take  1 tablet (40 mg total) by mouth daily. 90 tablet 3  . rosuvastatin (CRESTOR) 5 MG tablet Take 1 tablet (5 mg total) by mouth every other day. 45 tablet 3  . senna (SENOKOT) 8.6 MG tablet Take 1 tablet by mouth 2 (two) times daily.    . sitaGLIPtin (JANUVIA) 50 MG tablet Take 1 tablet (50 mg total) by mouth daily. 90 tablet 3  . ULTICARE MICRO PEN NEEDLES 32G X 4 MM MISC USE AS DIRECTED WITH LANTUS 100 each 1  . [DISCONTINUED] omeprazole (PRILOSEC OTC) 20 MG tablet Take 1 tablet (20 mg total) by mouth daily. 90 tablet 1   No current facility-administered medications on file prior to visit.     Review of Systems  Constitutional: Negative for activity change, appetite change, fatigue, fever and unexpected weight change.  HENT: Negative for congestion, rhinorrhea, sore throat and trouble swallowing.   Eyes: Negative for pain, redness, itching and visual disturbance.  Respiratory: Negative for cough, chest tightness, shortness of breath and wheezing.   Cardiovascular: Negative for chest pain and palpitations.  Gastrointestinal: Negative for abdominal pain, blood in stool, constipation, diarrhea and nausea.  Endocrine: Negative for cold intolerance, heat intolerance, polydipsia and polyuria.  Genitourinary: Negative for difficulty urinating, dysuria, frequency and urgency.  Musculoskeletal: Positive for back pain. Negative for arthralgias, joint swelling and myalgias.  Skin: Negative for pallor and rash.  Neurological: Positive for  weakness. Negative for dizziness, tremors, numbness and headaches.  Hematological: Negative for adenopathy. Does not bruise/bleed easily.  Psychiatric/Behavioral: Negative for decreased concentration and dysphoric mood. The patient is not nervous/anxious.        Objective:   Physical Exam Constitutional:      General: He is not in acute distress.    Appearance: Normal appearance. He is well-developed and normal weight. He is not ill-appearing or diaphoretic.     Comments: Frail appearing elderly male  HENT:     Head: Normocephalic and atraumatic.  Eyes:     Conjunctiva/sclera: Conjunctivae normal.     Pupils: Pupils are equal, round, and reactive to light.  Neck:     Thyroid: No thyromegaly.     Vascular: No carotid bruit or JVD.  Cardiovascular:     Rate and Rhythm: Normal rate and regular rhythm.     Heart sounds: Normal heart sounds. No gallop.   Pulmonary:     Effort: Pulmonary effort is normal. No respiratory distress.     Breath sounds: Normal breath sounds. No wheezing or rales.  Abdominal:     General: Bowel sounds are normal. There is no distension or abdominal bruit.     Palpations: Abdomen is soft. There is no mass.     Tenderness: There is no abdominal tenderness. There is no right CVA tenderness or left CVA tenderness.  Musculoskeletal:     Cervical back: Normal range of motion and neck supple.  Lymphadenopathy:     Cervical: No cervical adenopathy.  Skin:    General: Skin is warm and dry.     Coloration: Skin is not pale.     Findings: No erythema or rash.  Neurological:     Mental Status: He is alert.     Cranial Nerves: No cranial nerve deficit.     Coordination: Coordination normal.     Deep Tendon Reflexes: Reflexes are normal and symmetric.     Comments: No focal weakness Gait is slow but steady  Psychiatric:        Mood and Affect: Mood  normal.     Comments: Voices some anxiety regarding elevated glucose levels at home           Assessment &  Plan:   Problem List Items Addressed This Visit      Cardiovascular and Mediastinum   Essential hypertension    bp in fair control at this time  BP Readings from Last 1 Encounters:  04/18/21 140/62   No changes needed Most recent labs reviewed  Disc lifstyle change with low sodium diet and exercise  Currently taking amlodipine instead of lisinopril due to elevated cr above baseline with hospital visit Re check bmp today  He has some pedal edema from the amlodipine and would like to return to lisinopril if possible (if renal fxn returns to baseline) He has renal f/u soon also      Relevant Orders   Basic metabolic panel   CBC with Differential/Platelet     Endocrine   Controlled type 2 diabetes mellitus with chronic kidney disease ([place])    Lab Results  Component Value Date   HGBA1C 6.7 (H) 03/23/2021   Very well controlled but with hypoglycemia  Explained this is more dangerous than high glucose  The hospital d/c his glimiperide and he became very anxious about high glucose  We agreed on 2 mg bid of this with close supervision and permissible glucose elevation  Diet is returning to normal  Has endocrinology consult planned in June to discuss further  He continues lantus 15 u in am as well  inst to alert [place] if hypoglycemic and what to watch for  Cannot take metformin due to renal insuff utd eye care and takes statin Off ace for now due to renal insuff       Relevant Medications   glimepiride (AMARYL) 4 MG tablet     Genitourinary   Chronic kidney disease (CKD)    Recent AKI with renal stone Re checking today  Off ace for now  Has f/u with nephrology soon      Relevant Orders   Basic metabolic panel   CBC with Differential/Platelet   Renal stone - Primary    Recent hospitalization for stone removal and stent followed by hematuria Reviewed hospital records, lab results and studies in detail  Followed by urology and doing much better  Offered PT ref for weakness  s/p hosp and he declined  Continues close urology f/u      AKI (acute kidney injury) ([place])    Cr up to 2.02 after hydronephrosis I hospital with renal stone Expect improvement now  Labs ordered for this and cbc (also anemia of chronic dz) Has renal f/u soon  Off ace temporarily as well        Other   Anemia    Anemia of chronic dz with CKD Cbc today  Had feraheme in the hospital

## 2021-04-18 NOTE — Patient Instructions (Addendum)
Please allow blood sugar to stay above 120  If you continue having lows please let us know  I feel better with glimiperide 1/2 pill twice daily  It is much more dangerous to have low glucose than high glucose Follow up with the endocrinologist as planned  Get more protein if you can - meat, peanut butter, dairy products are good   Labs today for cbc and kidney function   Continue current medicines  I'm not sure if you will return to your prior bp medicine (lisinopril) until I get labs back

## 2021-04-18 NOTE — Assessment & Plan Note (Signed)
Cr up to 2.02 after hydronephrosis I hospital with renal stone Expect improvement now  Labs ordered for this and cbc (also anemia of chronic dz) Has renal f/u soon  Off ace temporarily as well

## 2021-04-18 NOTE — Assessment & Plan Note (Signed)
Recent AKI with renal stone Re checking today  Off ace for now  Has f/u with nephrology soon

## 2021-04-18 NOTE — Assessment & Plan Note (Addendum)
Recent hospitalization for stone removal and stent followed by hematuria Reviewed hospital records, lab results and studies in detail  Followed by urology and doing much better  Offered PT ref for weakness s/p hosp and he declined  Continues close urology f/u

## 2021-04-24 DIAGNOSIS — N1832 Chronic kidney disease, stage 3b: Secondary | ICD-10-CM | POA: Diagnosis not present

## 2021-04-30 DIAGNOSIS — I10 Essential (primary) hypertension: Secondary | ICD-10-CM | POA: Diagnosis not present

## 2021-04-30 DIAGNOSIS — E1129 Type 2 diabetes mellitus with other diabetic kidney complication: Secondary | ICD-10-CM | POA: Diagnosis not present

## 2021-04-30 DIAGNOSIS — N1832 Chronic kidney disease, stage 3b: Secondary | ICD-10-CM | POA: Diagnosis not present

## 2021-04-30 NOTE — Progress Notes (Signed)
05/01/2021 3:49 PM   [name] 1926-11-01 [id number]  Referring provider: Abner Greenspan, MD [address] [place],  [place] [address]  Chief Complaint  Patient presents with  . Urinary Retention   Urological history: 1. Nephrolithiasis -left URS for 3 mm UVJ stone 03/2021  2. Urinary retention -post-op retention after URS 03/2021 -PVR 65 mL    HPI: David Henry is a 87 y.o. male who presents today for follow up.  He feels he does not empty his bladder well and has a weak stream.  Patient denies any modifying or aggravating factors.  Patient denies any gross hematuria, dysuria or suprapubic/flank pain.  Patient denies any fevers, chills, nausea or vomiting.   PVR 65 mL.   He is taking tamsulosin 0.4 mg daily.     PMH: Past Medical History:  Diagnosis Date  . Arthritis   . CAD (coronary artery disease)    3 stents  . Colon polyp   . Diabetes mellitus    type II  . Dyspnea    with exertion  . GERD (gastroesophageal reflux disease)   . Hyperlipidemia   . Hypertension   . Hypothyroidism   . Kidney stones    stage III CKD  . Neuropathy   . Peripheral vascular disease ([place])    neuropathy in feet    Surgical History: Past Surgical History:  Procedure Laterality Date  . APPENDECTOMY  1984  . CATARACT EXTRACTION Bilateral 04/2001  . CHOLECYSTECTOMY  1989  . COLONOSCOPY N/A 2/14   hemorrhoids and tics (after pos ifob card)  . CORONARY ANGIOPLASTY  2006   3 stents  . CYSTOSCOPY/URETEROSCOPY/HOLMIUM LASER/STENT PLACEMENT Left 03/24/2021   Procedure: CYSTOSCOPY/URETEROSCOPY, RETROGRADE AND /STENT PLACEMENT;  Surgeon: [name], MD;  Location: [facility] OR[facility];  Service: Urology;  Laterality: Left;  . ESOPHAGOGASTRODUODENOSCOPY N/A 2/14  . RETINAL DETACHMENT SURGERY Right 2003  . SHOULDER ARTHROSCOPY WITH ROTATOR CUFF REPAIR AND SUBACROMIAL DECOMPRESSION Right 12/30/2017   Procedure: SHOULDER ARTHROSCOPY WITH ROTATOR CUFF REPAIR AND SUBACROMIAL  DECOMPRESSION;  Surgeon: Leanor Kail, MD;  Location: [facility] ORS;  Service: Orthopedics;  Laterality: Right;  . SHOULDER SURGERY  2012   Lt shoulder repair    Home Medications:  Allergies as of 05/01/2021      Reactions   Adhesive [tape] Dermatitis   Skin is thin so it is tough to take off.  Use paper tape   Azithromycin Nausea Only   REACTION: nausea   Celecoxib Other (See Comments)   Raises blood pressure   Zocor [simvastatin] Other (See Comments)   Muscle pain      Medication List       Accurate as of May 01, 2021 11:59 PM. If you have any questions, ask your nurse or doctor.        acetaminophen 650 MG CR tablet Commonly known as: TYLENOL Take 650 mg by mouth every 8 (eight) hours as needed.   amLODipine 5 MG tablet Commonly known as: NORVASC Take 1 tablet (5 mg total) by mouth daily.   aspirin 81 MG tablet Take 81 mg by mouth daily.   ferrous sulfate 325 (65 FE) MG EC tablet Take 1 tablet (325 mg total) by mouth 2 (two) times daily. What changed: when to take this   glimepiride 4 MG tablet Commonly known as: AMARYL Take 0.5 tablets (2 mg total) by mouth 2 (two) times daily.   Lantus SoloStar 100 UNIT/ML Solostar Pen Generic drug: insulin glargine INJECT 15  UNITS INTO THE SKIN EVERY MORNING   levothyroxine 50 MCG tablet Commonly known as: SYNTHROID Take 1 tablet (50 mcg total) by mouth daily before breakfast.   multivitamin capsule Take 1 capsule by mouth daily.   NON FORMULARY 100 BC ultra fine needles   ondansetron 4 MG disintegrating tablet Commonly known as: Zofran ODT Take 1 tablet (4 mg total) by mouth every 8 (eight) hours as needed for nausea or vomiting.   OneTouch Ultra test strip Generic drug: glucose blood CHECK BLOOD SUGAR TWICE DAILY AND AS DIRECTED FROM DM   oxyCODONE 5 MG immediate release tablet Commonly known as: Roxicodone Take 1 tablet (5 mg total) by mouth every 8 (eight) hours as needed.   pantoprazole 40 MG  tablet Commonly known as: PROTONIX Take 1 tablet (40 mg total) by mouth daily.   rosuvastatin 5 MG tablet Commonly known as: CRESTOR Take 1 tablet (5 mg total) by mouth every other day.   senna 8.6 MG tablet Commonly known as: SENOKOT Take 1 tablet by mouth 2 (two) times daily.   sitaGLIPtin 50 MG tablet Commonly known as: Januvia Take 1 tablet (50 mg total) by mouth daily.   tamsulosin 0.4 MG Caps capsule Commonly known as: FLOMAX Take 2 capsules (0.8 mg total) by mouth daily. Started by: Zara Council, PA-C   UltiCare Micro Pen Needles 32G X 4 MM Misc Generic drug: Insulin Pen Needle USE AS DIRECTED WITH LANTUS       Allergies:  Allergies  Allergen Reactions  . Adhesive [Tape] Dermatitis    Skin is thin so it is tough to take off.  Use paper tape  . Azithromycin Nausea Only    REACTION: nausea  . Celecoxib Other (See Comments)    Raises blood pressure  . Zocor [Simvastatin] Other (See Comments)    Muscle pain    Family History: Family History  Problem Relation Age of Onset  . Cancer Brother        prostate CA  . Heart disease Mother   . Stroke Father   . Kidney disease Sister   . Kidney disease Sister   . Clotting disorder Sister   . COPD Brother   . Heart disease Brother     Social History:  reports that he has never smoked. He has never used smokeless tobacco. He reports that he does not drink alcohol and does not use drugs.  ROS: Pertinent ROS in HPI  Physical Exam: BP (!) 151/65   Pulse 76   Ht 5' 8"  (1.727 m)   Wt 154 lb (69.9 kg)   BMI 23.42 kg/m   Constitutional:  Well nourished. Alert and oriented, No acute distress. HEENT: [place] AT, mask in place.  Trachea midline Cardiovascular: No clubbing, cyanosis, or edema. Respiratory: Normal respiratory effort, no increased work of breathing. Neurologic: Grossly intact, no focal deficits, moving all 4 extremities. Psychiatric: Normal mood and affect.  Laboratory Data: Glucose 65 - 99 mg/dL  237High                                                Fasting reference interval                         For someone without known diabetes, a glucose    value >125 mg/dL indicates that they may have  diabetes and this should be confirmed with a    follow-up test.      BUN 7 - 25 mg/dL 26High    Creatinine 0.70 - 1.11 mg/dL 1.62High    For patients >72 years of age, the reference limit    for Creatinine is approximately 13% higher for people    identified as African-American.      eGFR Non-African > OR = 60 mL/min/1.56m 36Low    eGFR African > OR = 60 mL/min/1.752m42Low    BUN/Creatinine Ratio 6 - 22 (calc) 16    Sodium 135 - 146 mmol/L 143    Potassium 3.5 - 5.3 mmol/L 4.9    Chloride 98 - 110 mmol/L 107    Bicarbonate (CO2) 20 - 32 mmol/L 24    Calcium 8.6 - 10.3 mg/dL 9.5    Phosphorus 2.1 - 4.3 mg/dL 3.1    Albumin 3.6 - 5.1 g/dL 4.0    Resulting Agency  [facility]    Resulting Agency Comment  Performing Organization Information:   Site ID: [id number]   Name: [facility]   Address: [address] [address] Director: JaCathleen Fearsessling Specimen Collected: 04/24/21 9:20 AM Last Resulted: 04/25/21 12:07 PM  Received From: [facility]  Result Received: 04/30/21 2:31 PM    Lab Results  Component Value Date   WBC 7.1 04/18/2021   HGB 12.0 (L) 04/18/2021   HCT 36.0 (L) 04/18/2021   MCV 102.7 (H) 04/18/2021   PLT 257.0 04/18/2021    Lab Results  Component Value Date   CREATININE 1.54 (H) 04/18/2021    Lab Results  Component Value Date   PSA 2.09 05/13/2011   PSA 1.83 08/18/2005      Lab Results  Component Value Date   HGBA1C 6.7 (H) 03/23/2021    Lab Results  Component Value Date   TSH 2.44 02/11/2021       Component Value Date/Time   CHOL 104 10/05/2020 0814   HDL 43.90 10/05/2020  0814   CHOLHDL 2 10/05/2020 0814   VLDL 12.8 10/05/2020 0814   LDLCALC 47 10/05/2020 0814    Lab Results  Component Value Date   AST 25 03/22/2021   Lab Results  Component Value Date   ALT 26 03/22/2021    Urinalysis    Component Value Date/Time   COLORURINE YELLOW (A) 03/22/2021 1523   APPEARANCEUR HAZY (A) 03/22/2021 1523   LABSPEC 1.016 03/22/2021 1523   PHURINE 5.0 03/22/2021 1523   GLUCOSEU NEGATIVE 03/22/2021 1523   GLUCOSEU NEGATIVE 06/23/2013 0822   HGBUR LARGE (A) 03/22/2021 1523   BILIRUBINUR NEGATIVE 03/22/2021 1523   BILIRUBINUR [place] 05/24/2013 0952   KETONESUR NEGATIVE 03/22/2021 1523   PROTEINUR 100 (A) 03/22/2021 1523   UROBILINOGEN 0.2 06/23/2013 0822   NITRITE NEGATIVE 03/22/2021 1523   LEUKOCYTESUR NEGATIVE 03/22/2021 1523  I have reviewed the labs.   Pertinent Imaging: Results for MAPISTOL, KESSENICHMRN [id number] of 05/01/2021 11:38  Ref. Range 05/01/2021 10:43  Scan Result Unknown 6541m  Assessment & Plan:    1. BPH with LU TS -Patient feels he does not empty his bladder completely and has weak urinary stream, we will go ahead and have him take 2 tamsulosin and advised him of the possible side effect of orthostatic hypotension and to discontinue the twice daily he should experience this  Return in about 1 month (around 06/01/2021) for IPSS and PVR.  These notes generated with  voice recognition software. I apologize for typographical errors.  Zara Council, PA-C  [facility] [address]  [place], [place] [address] [phone]

## 2021-05-01 ENCOUNTER — Other Ambulatory Visit: Payer: Self-pay

## 2021-05-01 ENCOUNTER — Ambulatory Visit: Payer: Medicare PPO | Admitting: Urology

## 2021-05-01 ENCOUNTER — Encounter: Payer: Self-pay | Admitting: Urology

## 2021-05-01 VITALS — BP 151/65 | HR 76 | Ht 68.0 in | Wt 154.0 lb

## 2021-05-01 DIAGNOSIS — R339 Retention of urine, unspecified: Secondary | ICD-10-CM

## 2021-05-01 DIAGNOSIS — N138 Other obstructive and reflux uropathy: Secondary | ICD-10-CM

## 2021-05-01 DIAGNOSIS — N401 Enlarged prostate with lower urinary tract symptoms: Secondary | ICD-10-CM

## 2021-05-01 LAB — BLADDER SCAN AMB NON-IMAGING

## 2021-05-01 MED ORDER — TAMSULOSIN HCL 0.4 MG PO CAPS: 0.8000 mg | ORAL_CAPSULE | Freq: Every day | ORAL | 0 refills | Status: DC

## 2021-05-21 ENCOUNTER — Other Ambulatory Visit: Payer: Self-pay | Admitting: Family Medicine

## 2021-05-23 NOTE — Telephone Encounter (Signed)
Looks like amlodipine was d/c by PCP in 2017, but recently added back by hospital doc on 03/28/21, will route to PCP for review   CPE scheduled on 10/14/21

## 2021-05-30 ENCOUNTER — Ambulatory Visit: Payer: Medicare PPO | Admitting: Urology

## 2021-07-22 DIAGNOSIS — I10 Essential (primary) hypertension: Secondary | ICD-10-CM | POA: Diagnosis not present

## 2021-07-22 DIAGNOSIS — E1129 Type 2 diabetes mellitus with other diabetic kidney complication: Secondary | ICD-10-CM | POA: Diagnosis not present

## 2021-07-22 DIAGNOSIS — N1832 Chronic kidney disease, stage 3b: Secondary | ICD-10-CM | POA: Diagnosis not present

## 2021-07-25 DIAGNOSIS — I10 Essential (primary) hypertension: Secondary | ICD-10-CM | POA: Diagnosis not present

## 2021-07-25 DIAGNOSIS — I77810 Thoracic aortic ectasia: Secondary | ICD-10-CM | POA: Diagnosis not present

## 2021-07-25 DIAGNOSIS — E782 Mixed hyperlipidemia: Secondary | ICD-10-CM | POA: Diagnosis not present

## 2021-07-25 DIAGNOSIS — I2510 Atherosclerotic heart disease of native coronary artery without angina pectoris: Secondary | ICD-10-CM | POA: Diagnosis not present

## 2021-07-25 DIAGNOSIS — I6523 Occlusion and stenosis of bilateral carotid arteries: Secondary | ICD-10-CM | POA: Diagnosis not present

## 2021-08-01 ENCOUNTER — Ambulatory Visit: Payer: Medicare PPO | Admitting: Dermatology

## 2021-08-15 ENCOUNTER — Ambulatory Visit: Payer: Medicare PPO | Admitting: Dermatology

## 2021-08-15 ENCOUNTER — Other Ambulatory Visit: Payer: Self-pay | Admitting: Family Medicine

## 2021-08-15 ENCOUNTER — Encounter: Payer: Self-pay | Admitting: Dermatology

## 2021-08-15 ENCOUNTER — Other Ambulatory Visit: Payer: Self-pay

## 2021-08-15 DIAGNOSIS — C4491 Basal cell carcinoma of skin, unspecified: Secondary | ICD-10-CM

## 2021-08-15 DIAGNOSIS — L570 Actinic keratosis: Secondary | ICD-10-CM | POA: Diagnosis not present

## 2021-08-15 DIAGNOSIS — C44319 Basal cell carcinoma of skin of other parts of face: Secondary | ICD-10-CM

## 2021-08-15 DIAGNOSIS — C44310 Basal cell carcinoma of skin of unspecified parts of face: Secondary | ICD-10-CM

## 2021-08-15 DIAGNOSIS — L820 Inflamed seborrheic keratosis: Secondary | ICD-10-CM

## 2021-08-15 DIAGNOSIS — D492 Neoplasm of unspecified behavior of bone, soft tissue, and skin: Secondary | ICD-10-CM

## 2021-08-15 DIAGNOSIS — L578 Other skin changes due to chronic exposure to nonionizing radiation: Secondary | ICD-10-CM

## 2021-08-15 HISTORY — DX: Basal cell carcinoma of skin, unspecified: C44.91

## 2021-08-15 NOTE — Progress Notes (Signed)
New Patient Visit  Subjective  David Henry is a 87 y.o. male who presents for the following: Skin Problem (Check growth on his face and chest  x 1 year, growing and irritating, not going away ).  The following portions of the chart were reviewed this encounter and updated as appropriate:   Tobacco  Allergies  Meds  Problems  Med Hx  Surg Hx  Fam Hx     Review of Systems:  No other skin or systemic complaints except as noted in HPI or Assessment and Plan.  Objective  Well appearing patient in no apparent distress; mood and affect are within normal limits.  A focused examination was performed including face. Relevant physical exam findings are noted in the Assessment and Plan.  right cheek 0.8 cm crusty papule   face (4) Erythematous thin papules/macules with gritty scale.   right clavicle, left temple x 2 (2) Erythematous keratotic or waxy stuck-on papule or plaque.    Assessment & Plan  Neoplasm of skin right cheek  Epidermal / dermal shaving  Lesion diameter (cm):  0.8 Informed consent: discussed and consent obtained   Timeout: patient name, date of birth, surgical site, and procedure verified   Procedure prep:  Patient was prepped and draped in usual sterile fashion Prep type:  Isopropyl alcohol Anesthesia: the lesion was anesthetized in a standard fashion   Anesthetic:  1% lidocaine w/ epinephrine 1-100,000 buffered w/ 8.4% NaHCO3 Hemostasis achieved with: pressure, aluminum chloride and electrodesiccation   Outcome: patient tolerated procedure well   Post-procedure details: sterile dressing applied and wound care instructions given   Dressing type: bandage and petrolatum    Destruction of lesion  Destruction method: electrodesiccation and curettage   Informed consent: discussed and consent obtained   Timeout:  patient name, date of birth, surgical site, and procedure verified Anesthesia: the lesion was anesthetized in a standard fashion    Anesthetic:  1% lidocaine w/ epinephrine 1-100,000 buffered w/ 8.4% NaHCO3 Curettage performed in three different directions: Yes   Electrodesiccation performed over the curetted area: Yes   Curettage cycles:  3 Lesion length (cm):  0.8 Lesion width (cm):  0.8 Margin per side (cm):  0.2 Final wound size (cm):  1.2 Hemostasis achieved with:  electrodesiccation Outcome: patient tolerated procedure well with no complications   Post-procedure details: sterile dressing applied and wound care instructions given   Dressing type: petrolatum    Specimen 1 - Surgical pathology Differential Diagnosis: R/O BCC  Check Margins: No  AK (actinic keratosis) (4) face  Destruction of lesion - face Complexity: simple   Destruction method: cryotherapy   Informed consent: discussed and consent obtained   Timeout:  patient name, date of birth, surgical site, and procedure verified Lesion destroyed using liquid nitrogen: Yes   Region frozen until ice ball extended beyond lesion: Yes   Outcome: patient tolerated procedure well with no complications   Post-procedure details: wound care instructions given    Inflamed seborrheic keratosis right clavicle, left temple x 2  Destruction of lesion - right clavicle, left temple x 2 Complexity: simple   Destruction method: cryotherapy   Informed consent: discussed and consent obtained   Timeout:  patient name, date of birth, surgical site, and procedure verified Lesion destroyed using liquid nitrogen: Yes   Region frozen until ice ball extended beyond lesion: Yes   Outcome: patient tolerated procedure well with no complications   Post-procedure details: wound care instructions given    Actinic Damage - chronic,  secondary to cumulative UV radiation exposure/sun exposure over time - diffuse scaly erythematous macules with underlying dyspigmentation - Recommend daily broad spectrum sunscreen SPF 30+ to sun-exposed areas, reapply every 2 hours as needed.   - Recommend staying in the shade or wearing long sleeves, sun glasses (UVA+UVB protection) and wide brim hats (4-inch brim around the entire circumference of the hat). - Call for new or changing lesions.   Follow up 3-6 mos  I, Marye Round, CMA, am acting as scribe for Sarina Ser, MD .  Documentation: I have reviewed the above documentation for accuracy and completeness, and I agree with the above.  Sarina Ser, MD

## 2021-08-15 NOTE — Patient Instructions (Addendum)
Cryotherapy Aftercare  Wash gently with soap and water everyday.   Apply Vaseline and Band-Aid daily until healed.    Wound Care Instructions  Cleanse wound gently with soap and water once a day then pat dry with clean gauze. Apply a thing coat of Petrolatum (petroleum jelly, "Vaseline") over the wound (unless you have an allergy to this). We recommend that you use a new, sterile tube of Vaseline. Do not pick or remove scabs. Do not remove the yellow or white "healing tissue" from the base of the wound.  Cover the wound with fresh, clean, nonstick gauze and secure with paper tape. You may use Band-Aids in place of gauze and tape if the would is small enough, but would recommend trimming much of the tape off as there is often too much. Sometimes Band-Aids can irritate the skin.  You should call the office for your biopsy report after 1 week if you have not already been contacted.  If you experience any problems, such as abnormal amounts of bleeding, swelling, significant bruising, significant pain, or evidence of infection, please call the office immediately.  FOR ADULT SURGERY PATIENTS: If you need something for pain relief you may take 1 extra strength Tylenol (acetaminophen) AND 2 Ibuprofen (200mg each) together every 4 hours as needed for pain. (do not take these if you are allergic to them or if you have a reason you should not take them.) Typically, you may only need pain medication for 1 to 3 days.     If you have any questions or concerns for your doctor, please call our main line at [phone] and press option 4 to reach your doctor's medical assistant. If no one answers, please leave a voicemail as directed and we will return your call as soon as possible. Messages left after 4 pm will be answered the following business day.   You may also send us a message via MyChart. We typically respond to MyChart messages within 1-2 business days.  For prescription refills, please ask your  pharmacy to contact our office. Our fax number is [phone].  If you have an urgent issue when the clinic is closed that cannot wait until the next business day, you can page your doctor at the number below.    Please note that while we do our best to be available for urgent issues outside of office hours, we are not available 24/7.   If you have an urgent issue and are unable to reach us, you may choose to seek medical care at your doctor's office, retail clinic, urgent care center, or emergency room.  If you have a medical emergency, please immediately call 911 or go to the emergency department.  Pager Numbers  - Dr. Kowalski: [phone]  - Dr. Moye: [phone]  - Dr. Stewart: [phone]  In the event of inclement weather, please call our main line at [phone] for an update on the status of any delays or closures.  Dermatology Medication Tips: Please keep the boxes that topical medications come in in order to help keep track of the instructions about where and how to use these. Pharmacies typically print the medication instructions only on the boxes and not directly on the medication tubes.   If your medication is too expensive, please contact our office at [phone] option 4 or send us a message through MyChart.   We are unable to tell what your co-pay for medications will be in advance as this is different depending on your insurance coverage.   However, we may be able to find a substitute medication at lower cost or fill out paperwork to get insurance to cover a needed medication.   If a prior authorization is required to get your medication covered by your insurance company, please allow us 1-2 business days to complete this process.  Drug prices often vary depending on where the prescription is filled and some pharmacies may offer cheaper prices.  The website www.goodrx.com contains coupons for medications through different pharmacies. The prices here do not  account for what the cost may be with help from insurance (it may be cheaper with your insurance), but the website can give you the price if you did not use any insurance.  - You can print the associated coupon and take it with your prescription to the pharmacy.  - You may also stop by our office during regular business hours and pick up a GoodRx coupon card.  - If you need your prescription sent electronically to a different pharmacy, notify our office through [facility] MyChart or by phone at [phone] option 4.  

## 2021-08-19 ENCOUNTER — Telehealth: Payer: Self-pay

## 2021-08-19 NOTE — Telephone Encounter (Signed)
-----   Message from Ralene Bathe, MD sent at 08/19/2021  4:58 PM EDT ----- Diagnosis Skin , right cheek BASAL CELL CARCINOMA, NODULAR PATTERN, ULCERATED  Cancer - BCC Already treated Recheck next visit

## 2021-08-19 NOTE — Telephone Encounter (Signed)
Discussed biopsy results with pt  °

## 2021-08-23 DIAGNOSIS — H0100A Unspecified blepharitis right eye, upper and lower eyelids: Secondary | ICD-10-CM | POA: Diagnosis not present

## 2021-09-02 ENCOUNTER — Other Ambulatory Visit: Payer: Self-pay | Admitting: Family Medicine

## 2021-09-03 NOTE — Telephone Encounter (Signed)
Please ask if he ended up seeing endocrinologist and if so is he on the same medicines ?

## 2021-09-03 NOTE — Telephone Encounter (Signed)
04/18/21 office note notes that patient was to see Endocrinology in June. Do not see notes.  Was not sure how to proceed with this request. Thank you

## 2021-09-05 NOTE — Telephone Encounter (Signed)
Pt notified Rx sent I advised him of Dr. Marliss Coots comments he said he is watching his blood sugar daily and will keep [place] posted if it drops to low

## 2021-09-05 NOTE — Telephone Encounter (Signed)
Pt said he never received a call about a endocrinologist but he said he wants to stay with PCP to managing his DM it's easier for him. Pt said he is still taking both meds and never stopped taking them but he is asking if PCP would increase the amaryl the last Rx was to take 1/2 tab BID but his blood sugars have been running very high, yesterday mid day it was 180. Pt said he wants to take a whole pill of amaryl BID. If okay please send both Rxs to [facility]

## 2021-09-05 NOTE — Telephone Encounter (Signed)
I did send it in so he would not have to go without but I am concerned about low blood sugar with this so let me know what he says  Not sure what happened with the endocrinology referral

## 2021-09-05 NOTE — Telephone Encounter (Signed)
As I told him before, low glucose is more dangerous than high glucose so I am very apprehensive about raising that dose. He has run low so many times in the past. I referred to endocrinology because of this delicate situation and understand that he is very worried about high glucose.  I can route this to [place] to check on status of referral in the chart (though she is not working today) and wait for a response   Please ask if he has had any low glucose levels at all in the last month.  What is his avg glucose am and pm ?   Thanks

## 2021-09-19 NOTE — Telephone Encounter (Addendum)
Patient was scheduled with Endo back in June. Not sure if he cancelled the appt??  I do not see notes scanned or in [place].   06/19/2021    09:30 AM  -- Mee Hives L

## 2021-10-14 ENCOUNTER — Encounter: Payer: Self-pay | Admitting: Family Medicine

## 2021-10-14 ENCOUNTER — Other Ambulatory Visit: Payer: Self-pay | Admitting: Family Medicine

## 2021-10-14 ENCOUNTER — Telehealth (INDEPENDENT_AMBULATORY_CARE_PROVIDER_SITE_OTHER): Payer: Medicare PPO | Admitting: Family Medicine

## 2021-10-14 ENCOUNTER — Other Ambulatory Visit: Payer: Self-pay

## 2021-10-14 ENCOUNTER — Encounter: Payer: Medicare PPO | Admitting: Family Medicine

## 2021-10-14 DIAGNOSIS — J069 Acute upper respiratory infection, unspecified: Secondary | ICD-10-CM

## 2021-10-14 MED ORDER — DOXYCYCLINE HYCLATE 100 MG PO TABS: 100.0000 mg | ORAL_TABLET | Freq: Two times a day (BID) | ORAL | 0 refills | Status: DC

## 2021-10-14 NOTE — Assessment & Plan Note (Signed)
2 weeks s/p viral uri (may have been covid)  Enc fluids and rest  Expectorant prn  Doxycycline px - in light of length of illness /age/diabetes cxr ordered at the [facility] office (he cannot come here)  Disc symptom control and ER precautions  Update if not starting to improve in a week or if worsening  (esp if any wheeze or sob)

## 2021-10-14 NOTE — Progress Notes (Signed)
Virtual Visit via Video Note  I connected with David Henry on 10/14/21 at  4:00 PM EDT by a video enabled telemedicine application and verified that I am speaking with the correct person using two identifiers.  Location: Patient: home Provider: office    I discussed the limitations of evaluation and management by telemedicine and the availability of in person appointments. The patient expressed understanding and agreed to proceed.  Video failed, visit was done by phone  Parties involved in encounter  Patient: David Henry   Provider:  Loura Pardon MD   History of Present Illness: Pt presents for a deep cough  Cannot get the phlegm out / feels it rattling around  Wet sounding cough  No wheeze and no sob at all   Phlegm is clear to white in color  Chest is not sore    No fever  Had headache in the beginning and also ST-that is better now  Some nasal congestion and blood from nose also-better now  Still getting energy back/balance is off    Had covid earlier this on the 8th  That is when this all started   Is covid immunized   Otc: equate mucous relief (like mucinex)  Nothing else over the counter   Patient Active Problem List   Diagnosis Date Noted   Viral URI with cough 10/14/2021   AKI (acute kidney injury) ([place]) 03/23/2021   Renal stone 03/22/2021   Macrocytosis 10/05/2019   Routine general medical examination at a health care facility 03/09/2016   Constipation 09/17/2015   Encounter for Medicare annual wellness exam 12/08/2014   Hematuria, microscopic 05/26/2013   Chronic kidney disease (CKD) 05/24/2013   Anemia 11/23/2012   Hypothyroid 11/23/2012   GERD 11/29/2010   Coronary atherosclerosis 04/18/2007   Controlled type 2 diabetes mellitus with chronic kidney disease ([place]) 04/15/2007   Hyperlipidemia associated with type 2 diabetes mellitus ([place]) 04/15/2007   Essential hypertension 04/15/2007   MYOCARDIAL INFARCTION, HX OF 04/15/2007   ACTINIC  KERATOSIS 04/15/2007   Past Medical History:  Diagnosis Date   Arthritis    Basal cell carcinoma 08/15/2021   right cheek, EDC   CAD (coronary artery disease)    3 stents   Colon polyp    Diabetes mellitus    type II   Dyspnea    with exertion   GERD (gastroesophageal reflux disease)    Hyperlipidemia    Hypertension    Hypothyroidism    Kidney stones    stage III CKD   Neuropathy    Peripheral vascular disease ([place])    neuropathy in feet   Past Surgical History:  Procedure Laterality Date   APPENDECTOMY  1984   CATARACT EXTRACTION Bilateral 04/2001   CHOLECYSTECTOMY  1989   COLONOSCOPY N/A 2/14   hemorrhoids and tics (after pos ifob card)   CORONARY ANGIOPLASTY  2006   3 stents   CYSTOSCOPY/URETEROSCOPY/HOLMIUM LASER/STENT PLACEMENT Left 03/24/2021   Procedure: CYSTOSCOPY/URETEROSCOPY, RETROGRADE AND /STENT PLACEMENT;  Surgeon: [name], MD;  Location: [facility] ORS;  Service: Urology;  Laterality: Left;   ESOPHAGOGASTRODUODENOSCOPY N/A 2/14   RETINAL DETACHMENT SURGERY Right 2003   SHOULDER ARTHROSCOPY WITH ROTATOR CUFF REPAIR AND SUBACROMIAL DECOMPRESSION Right 12/30/2017   Procedure: SHOULDER ARTHROSCOPY WITH ROTATOR CUFF REPAIR AND SUBACROMIAL DECOMPRESSION;  Surgeon: Leanor Kail, MD;  Location: [facility] ORS;  Service: Orthopedics;  Laterality: Right;   SHOULDER SURGERY  2012   Lt shoulder repair   Social History   Tobacco Use  Smoking status: Never   Smokeless tobacco: Never  Vaping Use   Vaping Use: Never used  Substance Use Topics   Alcohol use: No    Alcohol/week: 0.0 standard drinks   Drug use: No   Family History  Problem Relation Age of Onset   Cancer Brother        prostate CA   Heart disease Mother    Stroke Father    Kidney disease Sister    Kidney disease Sister    Clotting disorder Sister    COPD Brother    Heart disease Brother    Allergies  Allergen Reactions   Adhesive [Tape] Dermatitis    Skin is thin so it is tough to take  off.  Use paper tape   Azithromycin Nausea Only    REACTION: nausea   Celecoxib Other (See Comments)    Raises blood pressure   Zocor [Simvastatin] Other (See Comments)    Muscle pain   Current Outpatient Medications on File Prior to Visit  Medication Sig Dispense Refill   acetaminophen (TYLENOL) 650 MG CR tablet Take 650 mg by mouth every 8 (eight) hours as needed.     amLODipine (NORVASC) 5 MG tablet TAKE 1 TABLET BY MOUTH ONCE A DAY 90 tablet 3   aspirin 81 MG tablet Take 81 mg by mouth daily.     ferrous sulfate 325 (65 FE) MG EC tablet Take 1 tablet (325 mg total) by mouth 2 (two) times daily. (Patient taking differently: Take 325 mg by mouth every other day.) 60 tablet 3   glimepiride (AMARYL) 4 MG tablet Take 1 tablet (4 mg total) by mouth 2 (two) times daily. 180 tablet 0   LANTUS SOLOSTAR 100 UNIT/ML Solostar Pen INJECT 15 UNITS INTO THE SKIN EVERY MORNING 15 mL 1   levothyroxine (SYNTHROID) 50 MCG tablet TAKE 1 TABLET BY MOUTH ONCE A DAY BEFOREBREAKFAST. 90 tablet 0   Multiple Vitamin (MULTIVITAMIN) capsule Take 1 capsule by mouth daily.     NON FORMULARY 100 BC ultra fine needles     ondansetron (ZOFRAN ODT) 4 MG disintegrating tablet Take 1 tablet (4 mg total) by mouth every 8 (eight) hours as needed for nausea or vomiting. 20 tablet 0   ONETOUCH ULTRA test strip CHECK BLOOD SUGAR TWICE DAILY AND AS DIRECTED FROM DM 200 each 1   oxyCODONE (ROXICODONE) 5 MG immediate release tablet Take 1 tablet (5 mg total) by mouth every 8 (eight) hours as needed. 20 tablet 0   pantoprazole (PROTONIX) 40 MG tablet Take 1 tablet (40 mg total) by mouth daily. 90 tablet 3   rosuvastatin (CRESTOR) 5 MG tablet Take 1 tablet (5 mg total) by mouth every other day. 45 tablet 3   senna (SENOKOT) 8.6 MG tablet Take 1 tablet by mouth 2 (two) times daily.     sitaGLIPtin (JANUVIA) 50 MG tablet Take 1 tablet (50 mg total) by mouth daily. 90 tablet 3   tamsulosin (FLOMAX) 0.4 MG CAPS capsule Take 2 capsules  (0.8 mg total) by mouth daily. 180 capsule 0   ULTICARE MICRO PEN NEEDLES 32G X 4 MM MISC USE AS DIRECTED WITH LANTUS 100 each 1   [DISCONTINUED] omeprazole (PRILOSEC OTC) 20 MG tablet Take 1 tablet (20 mg total) by mouth daily. 90 tablet 1   No current facility-administered medications on file prior to visit.     Review of Systems  Constitutional:  Negative for chills, fever and malaise/fatigue.  HENT:  Positive for congestion. Negative  for ear pain, sinus pain and sore throat.   Eyes:  Negative for blurred vision, discharge and redness.  Respiratory:  Positive for cough and sputum production. Negative for shortness of breath, wheezing and stridor.   Cardiovascular:  Negative for chest pain, palpitations and leg swelling.  Gastrointestinal:  Negative for abdominal pain, diarrhea, nausea and vomiting.  Musculoskeletal:  Negative for myalgias.  Skin:  Negative for rash.  Neurological:  Positive for headaches. Negative for dizziness.   Observations/Objective:   Assessment and Plan: Problem List Items Addressed This Visit       Respiratory   Viral URI with cough    2 weeks s/p viral uri (may have been covid)  Enc fluids and rest  Expectorant prn  Doxycycline px - in light of length of illness /age/diabetes cxr ordered at the [facility] office (he cannot come here)  Disc symptom control and ER precautions  Update if not starting to improve in a week or if worsening  (esp if any wheeze or sob)      Relevant Orders   DG Chest 2 View     Follow Up Instructions: Drink fluids and rest  mucinex DM is good for cough and congestion  Nasal saline for congestion as needed  Tylenol for fever or pain or headache  Please alert [place] if symptoms worsen (if severe or short of breath please go to the ER)   The office will call you to set up a chest xray for the [facility] office   Take the doxycycline as directed    I discussed the assessment and treatment plan with the patient.  The patient was provided an opportunity to ask questions and all were answered. The patient agreed with the plan and demonstrated an understanding of the instructions.   The patient was advised to call back or seek an in-person evaluation if the symptoms worsen or if the condition fails to improve as anticipated.  I provided 17 minutes of non-face-to-face time during this encounter.   Loura Pardon, MD

## 2021-10-14 NOTE — Patient Instructions (Signed)
Drink fluids and rest  mucinex DM is good for cough and congestion  Nasal saline for congestion as needed  Tylenol for fever or pain or headache  Please alert [place] if symptoms worsen (if severe or short of breath please go to the ER)   The office will call you to set up a chest xray for the [facility] office   Take the doxycycline as directed

## 2021-10-15 ENCOUNTER — Other Ambulatory Visit: Payer: Medicare PPO

## 2021-10-15 ENCOUNTER — Ambulatory Visit (INDEPENDENT_AMBULATORY_CARE_PROVIDER_SITE_OTHER): Payer: Medicare PPO

## 2021-10-15 ENCOUNTER — Other Ambulatory Visit: Payer: Self-pay

## 2021-10-15 DIAGNOSIS — J069 Acute upper respiratory infection, unspecified: Secondary | ICD-10-CM

## 2021-10-15 DIAGNOSIS — R059 Cough, unspecified: Secondary | ICD-10-CM | POA: Diagnosis not present

## 2021-10-17 DIAGNOSIS — H35372 Puckering of macula, left eye: Secondary | ICD-10-CM | POA: Diagnosis not present

## 2021-10-17 DIAGNOSIS — E119 Type 2 diabetes mellitus without complications: Secondary | ICD-10-CM | POA: Diagnosis not present

## 2021-10-17 DIAGNOSIS — Z0100 Encounter for examination of eyes and vision without abnormal findings: Secondary | ICD-10-CM | POA: Diagnosis not present

## 2021-10-17 LAB — HM DIABETES EYE EXAM

## 2021-10-24 ENCOUNTER — Encounter: Payer: Self-pay | Admitting: Family Medicine

## 2021-11-04 ENCOUNTER — Other Ambulatory Visit: Payer: Self-pay | Admitting: Family Medicine

## 2021-11-07 ENCOUNTER — Other Ambulatory Visit: Payer: Self-pay | Admitting: Family Medicine

## 2021-11-11 DIAGNOSIS — E1129 Type 2 diabetes mellitus with other diabetic kidney complication: Secondary | ICD-10-CM | POA: Diagnosis not present

## 2021-11-11 DIAGNOSIS — I10 Essential (primary) hypertension: Secondary | ICD-10-CM | POA: Diagnosis not present

## 2021-11-11 DIAGNOSIS — N1832 Chronic kidney disease, stage 3b: Secondary | ICD-10-CM | POA: Diagnosis not present

## 2021-11-11 DIAGNOSIS — E875 Hyperkalemia: Secondary | ICD-10-CM | POA: Diagnosis not present

## 2021-11-18 ENCOUNTER — Other Ambulatory Visit: Payer: Self-pay | Admitting: Family Medicine

## 2021-11-20 ENCOUNTER — Ambulatory Visit: Payer: Medicare PPO | Admitting: Dermatology

## 2021-11-21 DIAGNOSIS — N1832 Chronic kidney disease, stage 3b: Secondary | ICD-10-CM | POA: Diagnosis not present

## 2021-11-21 DIAGNOSIS — I10 Essential (primary) hypertension: Secondary | ICD-10-CM | POA: Diagnosis not present

## 2021-11-21 DIAGNOSIS — E1129 Type 2 diabetes mellitus with other diabetic kidney complication: Secondary | ICD-10-CM | POA: Diagnosis not present

## 2021-12-05 ENCOUNTER — Other Ambulatory Visit: Payer: Self-pay | Admitting: Family Medicine

## 2021-12-05 NOTE — Telephone Encounter (Signed)
Name of Medication: glimepiride 4 mg taking one tab bid Name of Pharmacy: [place] [facility] or Written Date and Quantity: # 180 on 09/05/21 Last Office Visit and Type:10/14/21 acute VV and 04/16/21 HFU  Next Office Visit and Type: 01/07/22 for annual exam  I spoke with [name] (DPR signed) and she said pt is taking the glimepiride 4mg  bid,see 09/02/21 refill note where glimepiride was increased from 2 mg  bid to 4 mg bid). Januvia 50 mg one daily and Lantus 15 U every morning. [name] said that BS has been doing well with no low BS. Refill for glimepiride 4mg  taking bid # 180 to [facility]. [name] voiced understanding and appreciative of call. [name] is aware pt has appt for annual on 01/07/2022.Marland Kitchen

## 2021-12-06 ENCOUNTER — Other Ambulatory Visit: Payer: Self-pay | Admitting: Family Medicine

## 2021-12-19 DIAGNOSIS — H02132 Senile ectropion of right lower eyelid: Secondary | ICD-10-CM | POA: Diagnosis not present

## 2022-01-07 ENCOUNTER — Encounter: Payer: Medicare PPO | Admitting: Family Medicine

## 2022-01-13 ENCOUNTER — Other Ambulatory Visit: Payer: Self-pay | Admitting: Family Medicine

## 2022-01-18 ENCOUNTER — Other Ambulatory Visit: Payer: Self-pay | Admitting: Family Medicine

## 2022-02-12 ENCOUNTER — Other Ambulatory Visit: Payer: Self-pay | Admitting: Family Medicine

## 2022-02-18 ENCOUNTER — Ambulatory Visit: Payer: Medicare PPO

## 2022-03-04 ENCOUNTER — Telehealth: Payer: Self-pay | Admitting: Family Medicine

## 2022-03-04 DIAGNOSIS — E1169 Type 2 diabetes mellitus with other specified complication: Secondary | ICD-10-CM

## 2022-03-04 DIAGNOSIS — I10 Essential (primary) hypertension: Secondary | ICD-10-CM

## 2022-03-04 DIAGNOSIS — E039 Hypothyroidism, unspecified: Secondary | ICD-10-CM

## 2022-03-04 DIAGNOSIS — E1122 Type 2 diabetes mellitus with diabetic chronic kidney disease: Secondary | ICD-10-CM

## 2022-03-04 NOTE — Telephone Encounter (Signed)
-----   Message from Ellamae Sia sent at 02/24/2022 12:38 PM EST ----- ?Regarding: Lab orders for Wednesday, 3.15.23 ?Patient is scheduled for CPX labs, please order future labs, Thanks , Terri ? ? ?

## 2022-03-05 ENCOUNTER — Other Ambulatory Visit: Payer: Self-pay

## 2022-03-05 ENCOUNTER — Other Ambulatory Visit (INDEPENDENT_AMBULATORY_CARE_PROVIDER_SITE_OTHER): Payer: Medicare PPO

## 2022-03-05 DIAGNOSIS — E1169 Type 2 diabetes mellitus with other specified complication: Secondary | ICD-10-CM | POA: Diagnosis not present

## 2022-03-05 DIAGNOSIS — Z794 Long term (current) use of insulin: Secondary | ICD-10-CM

## 2022-03-05 DIAGNOSIS — E785 Hyperlipidemia, unspecified: Secondary | ICD-10-CM | POA: Diagnosis not present

## 2022-03-05 DIAGNOSIS — E039 Hypothyroidism, unspecified: Secondary | ICD-10-CM | POA: Diagnosis not present

## 2022-03-05 DIAGNOSIS — E1122 Type 2 diabetes mellitus with diabetic chronic kidney disease: Secondary | ICD-10-CM

## 2022-03-05 DIAGNOSIS — I10 Essential (primary) hypertension: Secondary | ICD-10-CM | POA: Diagnosis not present

## 2022-03-05 DIAGNOSIS — N183 Chronic kidney disease, stage 3 unspecified: Secondary | ICD-10-CM | POA: Diagnosis not present

## 2022-03-05 LAB — CBC WITH DIFFERENTIAL/PLATELET
Basophils Absolute: 0 10*3/uL (ref 0.0–0.1)
Basophils Relative: 0.7 % (ref 0.0–3.0)
Eosinophils Absolute: 0.2 10*3/uL (ref 0.0–0.7)
Eosinophils Relative: 3.2 % (ref 0.0–5.0)
HCT: 36.8 % — ABNORMAL LOW (ref 39.0–52.0)
Hemoglobin: 12.3 g/dL — ABNORMAL LOW (ref 13.0–17.0)
Lymphocytes Relative: 24.7 % (ref 12.0–46.0)
Lymphs Abs: 1.7 10*3/uL (ref 0.7–4.0)
MCHC: 33.5 g/dL (ref 30.0–36.0)
MCV: 101.8 fl — ABNORMAL HIGH (ref 78.0–100.0)
Monocytes Absolute: 0.6 10*3/uL (ref 0.1–1.0)
Monocytes Relative: 9 % (ref 3.0–12.0)
Neutro Abs: 4.4 10*3/uL (ref 1.4–7.7)
Neutrophils Relative %: 62.4 % (ref 43.0–77.0)
Platelets: 201 10*3/uL (ref 150.0–400.0)
RBC: 3.61 Mil/uL — ABNORMAL LOW (ref 4.22–5.81)
RDW: 14.5 % (ref 11.5–15.5)
WBC: 7.1 10*3/uL (ref 4.0–10.5)

## 2022-03-05 LAB — LIPID PANEL
Cholesterol: 115 mg/dL (ref 0–200)
HDL: 40.4 mg/dL (ref >39.00)
LDL Cholesterol: 55 mg/dL (ref 0–99)
NonHDL: 74.58
Total CHOL/HDL Ratio: 3
Triglycerides: 97 mg/dL (ref 0.0–149.0)
VLDL: 19.4 mg/dL (ref 0.0–40.0)

## 2022-03-05 LAB — COMPREHENSIVE METABOLIC PANEL
ALT: 28 U/L (ref 0–53)
AST: 20 U/L (ref 0–37)
Albumin: 4 g/dL (ref 3.5–5.2)
Alkaline Phosphatase: 92 U/L (ref 39–117)
BUN: 39 mg/dL — ABNORMAL HIGH (ref 6–23)
CO2: 29 mEq/L (ref 19–32)
Calcium: 9.3 mg/dL (ref 8.4–10.5)
Chloride: 105 mEq/L (ref 96–112)
Creatinine, Ser: 1.93 mg/dL — ABNORMAL HIGH (ref 0.40–1.50)
GFR: 29.44 mL/min — ABNORMAL LOW (ref >60.00)
Glucose, Bld: 183 mg/dL — ABNORMAL HIGH (ref 70–99)
Potassium: 4.2 mEq/L (ref 3.5–5.1)
Sodium: 142 mEq/L (ref 135–145)
Total Bilirubin: 1 mg/dL (ref 0.2–1.2)
Total Protein: 6 g/dL (ref 6.0–8.3)

## 2022-03-05 LAB — TSH: TSH: 7.31 u[IU]/mL — ABNORMAL HIGH (ref 0.35–5.50)

## 2022-03-05 LAB — HEMOGLOBIN A1C: Hgb A1c MFr Bld: 8.9 % — ABNORMAL HIGH (ref 4.6–6.5)

## 2022-03-07 ENCOUNTER — Other Ambulatory Visit: Payer: Self-pay | Admitting: Family Medicine

## 2022-03-12 ENCOUNTER — Ambulatory Visit (INDEPENDENT_AMBULATORY_CARE_PROVIDER_SITE_OTHER): Payer: Medicare PPO | Admitting: Family Medicine

## 2022-03-12 ENCOUNTER — Encounter: Payer: Self-pay | Admitting: Family Medicine

## 2022-03-12 ENCOUNTER — Other Ambulatory Visit: Payer: Self-pay

## 2022-03-12 VITALS — BP 124/62 | HR 71 | Temp 97.9°F | Ht 66.5 in | Wt 154.0 lb

## 2022-03-12 DIAGNOSIS — K219 Gastro-esophageal reflux disease without esophagitis: Secondary | ICD-10-CM

## 2022-03-12 DIAGNOSIS — I10 Essential (primary) hypertension: Secondary | ICD-10-CM | POA: Diagnosis not present

## 2022-03-12 DIAGNOSIS — E039 Hypothyroidism, unspecified: Secondary | ICD-10-CM

## 2022-03-12 DIAGNOSIS — E1169 Type 2 diabetes mellitus with other specified complication: Secondary | ICD-10-CM

## 2022-03-12 DIAGNOSIS — Z0000 Encounter for general adult medical examination without abnormal findings: Secondary | ICD-10-CM | POA: Diagnosis not present

## 2022-03-12 DIAGNOSIS — D649 Anemia, unspecified: Secondary | ICD-10-CM

## 2022-03-12 DIAGNOSIS — N1832 Chronic kidney disease, stage 3b: Secondary | ICD-10-CM | POA: Diagnosis not present

## 2022-03-12 DIAGNOSIS — E1122 Type 2 diabetes mellitus with diabetic chronic kidney disease: Secondary | ICD-10-CM

## 2022-03-12 DIAGNOSIS — N183 Chronic kidney disease, stage 3 unspecified: Secondary | ICD-10-CM

## 2022-03-12 DIAGNOSIS — E785 Hyperlipidemia, unspecified: Secondary | ICD-10-CM

## 2022-03-12 DIAGNOSIS — Z794 Long term (current) use of insulin: Secondary | ICD-10-CM

## 2022-03-12 MED ORDER — AMLODIPINE BESYLATE 5 MG PO TABS: 5.0000 mg | ORAL_TABLET | Freq: Every day | ORAL | 3 refills | Status: DC

## 2022-03-12 MED ORDER — TAMSULOSIN HCL 0.4 MG PO CAPS: 0.4000 mg | ORAL_CAPSULE | Freq: Every day | ORAL | 3 refills | Status: DC

## 2022-03-12 MED ORDER — PANTOPRAZOLE SODIUM 40 MG PO TBEC: 40.0000 mg | DELAYED_RELEASE_TABLET | Freq: Every day | ORAL | 3 refills | Status: DC

## 2022-03-12 MED ORDER — SITAGLIPTIN PHOSPHATE 50 MG PO TABS: 50.0000 mg | ORAL_TABLET | Freq: Every day | ORAL | 3 refills | Status: DC

## 2022-03-12 MED ORDER — LEVOTHYROXINE SODIUM 75 MCG PO TABS: 75.0000 ug | ORAL_TABLET | Freq: Every day | ORAL | 3 refills | Status: DC

## 2022-03-12 MED ORDER — LANTUS SOLOSTAR 100 UNIT/ML ~~LOC~~ SOPN: PEN_INJECTOR | SUBCUTANEOUS | 3 refills | Status: DC

## 2022-03-12 MED ORDER — ROSUVASTATIN CALCIUM 5 MG PO TABS: 5.0000 mg | ORAL_TABLET | ORAL | 3 refills | Status: DC

## 2022-03-12 MED ORDER — GLIMEPIRIDE 4 MG PO TABS: 4.0000 mg | ORAL_TABLET | Freq: Two times a day (BID) | ORAL | 3 refills | Status: DC

## 2022-03-12 NOTE — Assessment & Plan Note (Signed)
Protonix works better than omeprazole ?Continues 40 mg daily ?Urged to watch his diet ?B12 has not been low ?

## 2022-03-12 NOTE — Assessment & Plan Note (Signed)
Up-to-date with care from nephrology ?Encouraged good fluid intake ?Now off ACE inhibitor ?

## 2022-03-12 NOTE — Progress Notes (Signed)
? ?Subjective:  ? ? Patient ID: David Henry, male    DOB: 1926-01-21, 88 y.o.   MRN: [id number] ? ?This visit occurred during the SARS-CoV-2 public health emergency.  Safety protocols were in place, including screening questions prior to the visit, additional usage of staff PPE, and extensive cleaning of exam room while observing appropriate contact time as indicated for disinfecting solutions.  ? ?HPI ?Pt presents for amw and health mt visit  ? ?I have personally reviewed the Medicare Annual Wellness questionnaire and have noted ?1. The patient's medical and social history ?2. Their use of alcohol, tobacco or illicit drugs ?3. Their current medications and supplements ?4. The patient's functional ability including ADL's, fall risks, home safety risks and hearing or visual ?            impairment. ?5. Diet and physical activities ?6. Evidence for depression or mood disorders ? ?The patients weight, height, BMI have been recorded in the chart and visual acuity is per eye clinic.  ?I have made referrals, counseling and provided education to the patient based review of the above and I have provided the pt with a written personalized care plan for preventive services. ?Reviewed and updated provider list, see scanned forms. ? ?See scanned forms.  Routine anticipatory guidance given to patient.  See health maintenance. ?Colon cancer screening out aged  ? ? ?Flu vaccine: in the fall  ?Tetanus vaccine  Tdap 2019  ?Pneumovax up to date  ?Zoster vaccine declines  ? ?Falls-none ?Fractures-none  ?Is very careful  ?Supplements ?Exercise : quite active/ walks in the house  ?Works on [employer] when weather is good  ? ? ?Prostate cancer screening out aged ?Brother had prostate cancer  ?Advance directive: up to date  ?Cognitive function addressed- see scanned forms- and if abnormal then additional documentation follows.  ? ?Memory is pretty good ?Wife agrees  ?Occ misplaces things  ?Some socialization  ?Reads some but eyes  are bad  ? ? ?PMH and SH reviewed ? ?Meds, vitals, and allergies reviewed.  ? ?ROS: See HPI.  Otherwise negative.   ? ?Weight : ?Wt Readings from Last 3 Encounters:  ?03/12/22 154 lb (69.9 kg)  ?10/14/21 155 lb (70.3 kg)  ?05/01/21 154 lb (69.9 kg)  ? ?24.48 kg/m? ? ? ?Hearing/vision: ?Hearing Screening  ? '500Hz'$  '1000Hz'$  '2000Hz'$  '4000Hz'$   ?Right ear 0 0 0 0  ?Left ear 40 40 0 0  ?Vision Screening - Comments:: Eye exam in Oct 2022 at [facility]. Next appt with them is tomorrow  ?Vision is 20/50 ?R lower lib is drooping- seeing a specialist soon (dry but not too bothersome)  ?Has had cataract and retinal surgery  ?Dm eye exam 10/22  ? ?PHQ: ? ?  03/12/2022  ?  8:58 AM 10/14/2021  ?  3:59 PM 10/08/2020  ?  8:51 AM 10/03/2019  ?  8:36 AM 09/24/2018  ? 12:51 PM  ?Depression screen PHQ 2/9  ?Decreased Interest 0 0 0 0 0  ?Down, Depressed, Hopeless 0 0 0 0 0  ?PHQ - 2 Score 0 0 0 0 0  ?Altered sleeping     0  ?Tired, decreased energy     0  ?Change in appetite     0  ?Feeling bad or failure about yourself      0  ?Trouble concentrating     0  ?Moving slowly or fidgety/restless     0  ?Suicidal thoughts     0  ?  PHQ-9 Score     0  ?Difficult doing work/chores     Not difficult at all  ? ? ?ADLs: no help needed  ? ?Functionality: very good  ? ?Care team  ? ? ? ?HTN  (in setting of CAD) ?bp is stable today  ?No cp or palpitations or headaches or edema  ?No side effects to medicines  ?BP Readings from Last 3 Encounters:  ?03/12/22 124/62  ?05/01/21 (!) 151/65  ?04/18/21 140/62  ?  ? ?Amlodipine 5 mg daily ? ?Was on ace in the past and d/c due to renal issues  ? ? ?GERD-takes protonix (omeprazole stopped working ?B12 high a year ago  ? ?CKD sees nephrology ?Lab Results  ?Component Value Date  ? CREATININE 1.93 (H) 03/05/2022  ? BUN 39 (H) 03/05/2022  ? NA 142 03/05/2022  ? K 4.2 03/05/2022  ? CL 105 03/05/2022  ? CO2 29 03/05/2022  ?GFR is 29.4 ?Saw the kidney doctor recently  ? ?Anemia of chronic dz ?Lab Results  ?Component  Value Date  ? WBC 7.1 03/05/2022  ? HGB 12.3 (L) 03/05/2022  ? HCT 36.8 (L) 03/05/2022  ? MCV 101.8 (H) 03/05/2022  ? PLT 201.0 03/05/2022  ? ? ? ? ?DM2 ?Lab Results  ?Component Value Date  ? HGBA1C 8.9 (H) 03/05/2022  ? ?This is up from 6.7  ?Endocrinology in past   ?Amaryl ?Lantus ?Januvia ?He thinks he can get this down lower  ?Eating a lot of sweets and cornbread  ?Is eating protein  ? ? ?No lows recently  ?Gets up to 150 to 170  ? ? ?Eye exam was 09/2021  ? ? ?Hypothyroidism  ?Pt has no clinical changes ?No change in energy level/ hair or skin/ edema and no tremor ?Lab Results  ?Component Value Date  ? TSH 7.31 (H) 03/05/2022  ?  Levothyroxine 50 mcg daily  ?No missed doses  ?Takes it correctly  ? ?Hyperlipidemia ? ?Lab Results  ?Component Value Date  ? CHOL 115 03/05/2022  ? CHOL 104 10/05/2020  ? CHOL 110 04/02/2020  ? ?Lab Results  ?Component Value Date  ? HDL 40.40 03/05/2022  ? HDL 43.90 10/05/2020  ? HDL 43.60 04/02/2020  ? ?Lab Results  ?Component Value Date  ? [place] 55 03/05/2022  ? [place] 47 10/05/2020  ? [place] 53 04/02/2020  ? ?Lab Results  ?Component Value Date  ? TRIG 97.0 03/05/2022  ? TRIG 64.0 10/05/2020  ? TRIG 64.0 04/02/2020  ? ?Lab Results  ?Component Value Date  ? CHOLHDL 3 03/05/2022  ? CHOLHDL 2 10/05/2020  ? CHOLHDL 3 04/02/2020  ? ?No results found for: LDLDIRECT ?Taking rosuvastatin 5 mg every other day ? ? ?Patient Active Problem List  ? Diagnosis Date Noted  ? AKI (acute kidney injury) ([place]) 03/23/2021  ? Renal stone 03/22/2021  ? Macrocytosis 10/05/2019  ? Routine general medical examination at a health care facility 03/09/2016  ? Constipation 09/17/2015  ? Encounter for Medicare annual wellness exam 12/08/2014  ? Hematuria, microscopic 05/26/2013  ? Chronic kidney disease (CKD) 05/24/2013  ? Anemia 11/23/2012  ? Hypothyroid 11/23/2012  ? GERD 11/29/2010  ? Coronary atherosclerosis 04/18/2007  ? Controlled type 2 diabetes mellitus with chronic kidney disease ([place]) 04/15/2007  ?  Hyperlipidemia associated with type 2 diabetes mellitus ([place]) 04/15/2007  ? Essential hypertension 04/15/2007  ? MYOCARDIAL INFARCTION, HX OF 04/15/2007  ? ACTINIC KERATOSIS 04/15/2007  ? ?Past Medical History:  ?Diagnosis Date  ? Arthritis   ?  Basal cell carcinoma 08/15/2021  ? right cheek, EDC  ? CAD (coronary artery disease)   ? 3 stents  ? Colon polyp   ? Diabetes mellitus   ? type II  ? Dyspnea   ? with exertion  ? GERD (gastroesophageal reflux disease)   ? Hyperlipidemia   ? Hypertension   ? Hypothyroidism   ? Kidney stones   ? stage III CKD  ? Neuropathy   ? Peripheral vascular disease ([place])   ? neuropathy in feet  ? ?Past Surgical History:  ?Procedure Laterality Date  ? APPENDECTOMY  1984  ? CATARACT EXTRACTION Bilateral 04/2001  ? CHOLECYSTECTOMY  1989  ? COLONOSCOPY N/A 2/14  ? hemorrhoids and tics (after pos ifob card)  ? CORONARY ANGIOPLASTY  2006  ? 3 stents  ? CYSTOSCOPY/URETEROSCOPY/HOLMIUM LASER/STENT PLACEMENT Left 03/24/2021  ? Procedure: CYSTOSCOPY/URETEROSCOPY, RETROGRADE AND /STENT PLACEMENT;  Surgeon: [name], MD;  Location: [facility];  Service: Urology;  Laterality: Left;  ? ESOPHAGOGASTRODUODENOSCOPY N/A 2/14  ? RETINAL DETACHMENT SURGERY Right 2003  ? SHOULDER ARTHROSCOPY WITH ROTATOR CUFF REPAIR AND SUBACROMIAL DECOMPRESSION Right 12/30/2017  ? Procedure: SHOULDER ARTHROSCOPY WITH ROTATOR CUFF REPAIR AND SUBACROMIAL DECOMPRESSION;  Surgeon: Leanor Kail, MD;  Location: [facility] OR[facility];  Service: Orthopedics;  Laterality: Right;  ? SHOULDER SURGERY  2012  ? Lt shoulder repair  ? ?Social History  ? ?Tobacco Use  ? Smoking status: Never  ? Smokeless tobacco: Never  ?Vaping Use  ? Vaping Use: Never used  ?Substance Use Topics  ? Alcohol use: No  ?  Alcohol/week: 0.0 standard drinks  ? Drug use: No  ? ?Family History  ?Problem Relation Age of Onset  ? Cancer Brother   ?     prostate CA  ? Heart disease Mother   ? Stroke Father   ? Kidney disease Sister   ? Kidney disease Sister   ? Clotting  disorder Sister   ? COPD Brother   ? Heart disease Brother   ? ?Allergies  ?Allergen Reactions  ? Adhesive [Tape] Dermatitis  ?  Skin is thin so it is tough to take off.  Use paper tape  ? Azithromycin Nause

## 2022-03-12 NOTE — Assessment & Plan Note (Signed)
Disc goals for lipids and reasons to control them ?Rev last labs with pt ?Rev low sat fat diet in detail ?Plan to continue rosuvastatin 5 mg every other day   ?LDL is controlled at 55 ?

## 2022-03-12 NOTE — Assessment & Plan Note (Signed)
Lab Results  ?Component Value Date  ? HGBA1C 8.9 (H) 03/05/2022  ? ?Current medications include Lantus 15 units daily in the morning ?Amaryl 4 mg twice a day ?Januvia 50 mg daily ?Not well controlled, per patient sugar intake is drastically increased and he plans to watch this ?Reviewed low glycemic diet and precautions of low blood sugar as well ?Patient declines endocrinology referral at this time and would rather watch his diet, I did explain that I feel more comfortable with an endocrinologist since risk of low blood sugar is high with him but he still declines ?He will let us know if he changes his mind in the meantime ?I instructed him to keep very close tabs of his blood sugar eating sugar diet lower in sugar and plan to follow-up in 3 months ?Eye exam is up-to-date ?Foot care is good ?No longer on ACE due to renal function ?Taking statin ?

## 2022-03-12 NOTE — Patient Instructions (Addendum)
Replace most of your carbohydrates with fruit and veggies  ? ?If you eat a snack at night it has to be protein  ?A piece of fruit or raw vegetable with peanut butter or cheese is good  ? ? ?If you want to see a diabetes doctor let me know  ?Watch your blood sugar  ?Otherwise follow up in 3 months  ? ? ?Go up on the thyroid dose to 75 mcg (levothyroxine)  ? ?Follow up in 3 months  ? ? ? ?

## 2022-03-12 NOTE — Assessment & Plan Note (Signed)
bp in fair control at this time  ?BP Readings from Last 1 Encounters:  ?03/12/22 124/62  ? ?No changes needed ?Most recent labs reviewed  ?Disc lifstyle change with low sodium diet and exercise  ?Plan to continue amlodipine 5 mg daily ?Continues nephrology care ?

## 2022-03-12 NOTE — Assessment & Plan Note (Signed)
Reviewed health habits including diet and exercise and skin cancer prevention ?Reviewed appropriate screening tests for age  ?Also reviewed health mt list, fam hx and immunization status , as well as social and family history   ?See HPI ?Labs reviewed ?Out aged colon cancer screening and prostate cancer screening ?Declines Shingrix vaccine ?No falls or fractures, exercise is fair ?Advanced directive is up-to-date ?Cognition is stable, no concerns, discussed importance of socialization for brain health ?Hearing is noted to be decreased in the right ear, offer was made to refer to audiology ?Patient has up-to-date vision and eye care including diabetic eye exam, currently low working with a subspecialist for lid droop in the right eye, he does not drive at night ?PHQ score 0 ?No help needed with ADLs at home, functionality is very good ?

## 2022-03-12 NOTE — Assessment & Plan Note (Signed)
anemia of chronic disease ?In the setting of CKD ?Hemoglobin is stable at 12.3 ?Patient takes ferrous sulfate every other day ?

## 2022-03-12 NOTE — Assessment & Plan Note (Signed)
Lab Results  ?Component Value Date  ? TSH 7.31 (H) 03/05/2022  ? ?No missed doses ?No clinical changes ?Plan to increase levothyroxine from 50 to 75 mcg daily  ?We will recheck this at follow-up in 3 months ?

## 2022-03-13 DIAGNOSIS — H02132 Senile ectropion of right lower eyelid: Secondary | ICD-10-CM | POA: Diagnosis not present

## 2022-03-17 ENCOUNTER — Telehealth: Payer: Self-pay | Admitting: Family Medicine

## 2022-03-17 NOTE — Telephone Encounter (Signed)
insulin glargine (LANTUS SOLOSTAR) 100 UNIT/ML Solostar Pen ? ?[facility] no longer carries this medication  ? ?Patient currently has a 30 day supply from the pharmacy, the last they will fill (temporary refill) ? ?Please call [phone] with further instructions, or if you need to review her letter from [facility] ?

## 2022-03-17 NOTE — Telephone Encounter (Signed)
Thanks, what do they cover as an alternative?  ?

## 2022-03-19 MED ORDER — INSULIN DETEMIR 100 UNIT/ML ~~LOC~~ SOLN: 15.0000 [IU] | Freq: Every morning | SUBCUTANEOUS | 3 refills | Status: DC

## 2022-03-19 NOTE — Telephone Encounter (Signed)
Pt notified Rx sent and advised of Dr. Marliss Coots comments and verbalized understanding  ?

## 2022-03-19 NOTE — Telephone Encounter (Signed)
The subs do not entirely make sense to me and I even reviewed the paper with out pharmacist ? ?I sent levemir  ?Let's see if that works  ? ?Is the same/same thing and admin ?Let me know if any problems  ?If this does not work perhaps the pharmacist there can guide me ?

## 2022-03-19 NOTE — Telephone Encounter (Signed)
Pt came in office to drop off paperwork regarding medication . It was place in mail box ?

## 2022-03-19 NOTE — Telephone Encounter (Signed)
Letter received and placed in PCP's inbox, they have highlighted the covered alt list of meds  ?

## 2022-03-19 NOTE — Telephone Encounter (Signed)
Wife will drop off letter they received so we can review it because it has a list of alt meds on the letter  ?

## 2022-04-23 ENCOUNTER — Other Ambulatory Visit: Payer: Self-pay | Admitting: Family Medicine

## 2022-04-23 NOTE — Telephone Encounter (Signed)
Looks like [place] gave pt med on 03/28/21 #60 tabs with 3 refills, please advise  ?

## 2022-04-24 DIAGNOSIS — E782 Mixed hyperlipidemia: Secondary | ICD-10-CM | POA: Diagnosis not present

## 2022-04-24 DIAGNOSIS — I2510 Atherosclerotic heart disease of native coronary artery without angina pectoris: Secondary | ICD-10-CM | POA: Diagnosis not present

## 2022-04-24 DIAGNOSIS — I10 Essential (primary) hypertension: Secondary | ICD-10-CM | POA: Diagnosis not present

## 2022-04-24 DIAGNOSIS — R001 Bradycardia, unspecified: Secondary | ICD-10-CM | POA: Diagnosis not present

## 2022-04-24 DIAGNOSIS — I77810 Thoracic aortic ectasia: Secondary | ICD-10-CM | POA: Diagnosis not present

## 2022-04-24 DIAGNOSIS — I340 Nonrheumatic mitral (valve) insufficiency: Secondary | ICD-10-CM | POA: Diagnosis not present

## 2022-04-24 DIAGNOSIS — I6523 Occlusion and stenosis of bilateral carotid arteries: Secondary | ICD-10-CM | POA: Diagnosis not present

## 2022-04-24 DIAGNOSIS — N1831 Chronic kidney disease, stage 3a: Secondary | ICD-10-CM | POA: Diagnosis not present

## 2022-04-24 DIAGNOSIS — E119 Type 2 diabetes mellitus without complications: Secondary | ICD-10-CM | POA: Diagnosis not present

## 2022-04-24 NOTE — Telephone Encounter (Signed)
Called pt and he said he is taking one tablet every day, not every other day and request Rx be sent in for once daily dosing  ?

## 2022-05-01 ENCOUNTER — Other Ambulatory Visit: Payer: Self-pay | Admitting: Family Medicine

## 2022-05-06 DIAGNOSIS — I10 Essential (primary) hypertension: Secondary | ICD-10-CM | POA: Diagnosis not present

## 2022-05-06 DIAGNOSIS — N1832 Chronic kidney disease, stage 3b: Secondary | ICD-10-CM | POA: Diagnosis not present

## 2022-05-06 DIAGNOSIS — E1129 Type 2 diabetes mellitus with other diabetic kidney complication: Secondary | ICD-10-CM | POA: Diagnosis not present

## 2022-05-13 DIAGNOSIS — N1832 Chronic kidney disease, stage 3b: Secondary | ICD-10-CM | POA: Diagnosis not present

## 2022-05-13 DIAGNOSIS — I10 Essential (primary) hypertension: Secondary | ICD-10-CM | POA: Diagnosis not present

## 2022-05-13 DIAGNOSIS — E1129 Type 2 diabetes mellitus with other diabetic kidney complication: Secondary | ICD-10-CM | POA: Diagnosis not present

## 2022-05-13 DIAGNOSIS — N2581 Secondary hyperparathyroidism of renal origin: Secondary | ICD-10-CM | POA: Diagnosis not present

## 2022-05-15 DIAGNOSIS — H02132 Senile ectropion of right lower eyelid: Secondary | ICD-10-CM | POA: Diagnosis not present

## 2022-05-30 ENCOUNTER — Ambulatory Visit
Admission: EM | Admit: 2022-05-30 | Discharge: 2022-05-30 | Disposition: A | Discharge status: Home / Self Care | Payer: Medicare PPO | Attending: Family Medicine | Admitting: Family Medicine

## 2022-05-30 ENCOUNTER — Telehealth: Payer: Self-pay | Admitting: Family Medicine

## 2022-05-30 ENCOUNTER — Ambulatory Visit (INDEPENDENT_AMBULATORY_CARE_PROVIDER_SITE_OTHER): Payer: Medicare PPO

## 2022-05-30 ENCOUNTER — Ambulatory Visit (HOSPITAL_COMMUNITY): Payer: Medicare PPO

## 2022-05-30 DIAGNOSIS — M25562 Pain in left knee: Secondary | ICD-10-CM | POA: Diagnosis not present

## 2022-05-30 DIAGNOSIS — S8992XA Unspecified injury of left lower leg, initial encounter: Secondary | ICD-10-CM

## 2022-05-30 NOTE — ED Provider Notes (Signed)
Roderic Palau    CSN: [id number] Arrival date & time: 05/30/22  1311      History   Chief Complaint No chief complaint on file.   HPI MILBERT BIXLER is a 88 y.o. male.   HPI Patient was mowing the grass and fell from the lawn mover today.  During the fall he landed on his knee and reports feeling the sensation of something popping and pain in the back of knee.   Past Medical History:  Diagnosis Date  . Arthritis   . Basal cell carcinoma 08/15/2021   right cheek, EDC  . CAD (coronary artery disease)    3 stents  . Colon polyp   . Diabetes mellitus    type II  . Dyspnea    with exertion  . GERD (gastroesophageal reflux disease)   . Hyperlipidemia   . Hypertension   . Hypothyroidism   . Kidney stones    stage III CKD  . Neuropathy   . Peripheral vascular disease ([place])    neuropathy in feet    Patient Active Problem List   Diagnosis Date Noted  . AKI (acute kidney injury) ([place]) 03/23/2021  . Renal stone 03/22/2021  . Macrocytosis 10/05/2019  . Routine general medical examination at a health care facility 03/09/2016  . Constipation 09/17/2015  . Encounter for Medicare annual wellness exam 12/08/2014  . Hematuria, microscopic 05/26/2013  . Chronic kidney disease (CKD) 05/24/2013  . Anemia 11/23/2012  . Hypothyroid 11/23/2012  . GERD 11/29/2010  . Coronary atherosclerosis 04/18/2007  . Controlled type 2 diabetes mellitus with chronic kidney disease ([place]) 04/15/2007  . Hyperlipidemia associated with type 2 diabetes mellitus ([place]) 04/15/2007  . Essential hypertension 04/15/2007  . MYOCARDIAL INFARCTION, HX OF 04/15/2007  . ACTINIC KERATOSIS 04/15/2007    Past Surgical History:  Procedure Laterality Date  . APPENDECTOMY  1984  . CATARACT EXTRACTION Bilateral 04/2001  . CHOLECYSTECTOMY  1989  . COLONOSCOPY N/A 2/14   hemorrhoids and tics (after pos ifob card)  . CORONARY ANGIOPLASTY  2006   3 stents  . CYSTOSCOPY/URETEROSCOPY/HOLMIUM LASER/STENT  PLACEMENT Left 03/24/2021   Procedure: CYSTOSCOPY/URETEROSCOPY, RETROGRADE AND /STENT PLACEMENT;  Surgeon: [name], MD;  Location: [facility] OR[facility];  Service: Urology;  Laterality: Left;  . ESOPHAGOGASTRODUODENOSCOPY N/A 2/14  . RETINAL DETACHMENT SURGERY Right 2003  . SHOULDER ARTHROSCOPY WITH ROTATOR CUFF REPAIR AND SUBACROMIAL DECOMPRESSION Right 12/30/2017   Procedure: SHOULDER ARTHROSCOPY WITH ROTATOR CUFF REPAIR AND SUBACROMIAL DECOMPRESSION;  Surgeon: Leanor Kail, MD;  Location: [facility];  Service: Orthopedics;  Laterality: Right;  . SHOULDER SURGERY  2012   Lt shoulder repair       Home Medications    Prior to Admission medications   Medication Sig Start Date End Date Taking? Authorizing Provider  ACCU-CHEK AVIVA PLUS test strip CHECK BLOOD SUGAR TWICE DAILY AND AS DIRECTED FROM DM 01/20/22   Tower, Wynelle Fanny, MD  acetaminophen (TYLENOL) 650 MG CR tablet Take 650 mg by mouth every 8 (eight) hours as needed.    [provider]  amLODipine (NORVASC) 5 MG tablet Take 1 tablet (5 mg total) by mouth daily. 03/12/22   Tower, Wynelle Fanny, MD  aspirin 81 MG tablet Take 81 mg by mouth daily.    [provider]  ferrous sulfate (FEROSUL) 325 (65 FE) MG tablet Take 1 tablet (325 mg total) by mouth daily. With food 04/24/22   Tower, Roque Lias A, MD  glimepiride (AMARYL) 4 MG tablet Take 1 tablet (4 mg  total) by mouth 2 (two) times daily. 03/12/22   Tower, Wynelle Fanny, MD  insulin detemir (LEVEMIR) 100 UNIT/ML injection Inject 0.15 mLs (15 Units total) into the skin in the morning. 03/19/22   Tower, Wynelle Fanny, MD  levothyroxine (SYNTHROID) 75 MCG tablet Take 1 tablet (75 mcg total) by mouth daily. 03/12/22   Tower, Wynelle Fanny, MD  Multiple Vitamin (MULTIVITAMIN) capsule Take 1 capsule by mouth daily.    [provider]  NON FORMULARY 100 BC ultra fine needles    [provider]  ondansetron (ZOFRAN ODT) 4 MG disintegrating tablet Take 1 tablet (4 mg total) by mouth every 8 (eight)  hours as needed for nausea or vomiting. 03/22/21   Vanessa Rockwood, MD  pantoprazole (PROTONIX) 40 MG tablet Take 1 tablet (40 mg total) by mouth daily. 03/12/22   Tower, Wynelle Fanny, MD  rosuvastatin (CRESTOR) 5 MG tablet Take 1 tablet (5 mg total) by mouth every other day. 03/12/22   Tower, Wynelle Fanny, MD  senna (SENOKOT) 8.6 MG tablet Take 1 tablet by mouth 2 (two) times daily.    [provider]  sitaGLIPtin (JANUVIA) 50 MG tablet Take 1 tablet (50 mg total) by mouth daily. 03/12/22   Tower, Wynelle Fanny, MD  tamsulosin (FLOMAX) 0.4 MG CAPS capsule Take 1 capsule (0.4 mg total) by mouth daily. 03/12/22   Tower, Wynelle Fanny, MD  ULTICARE MICRO PEN NEEDLES 32G X 4 MM MISC USE AS DIRECTED WITH LANTUS 05/01/22   Tower, Wynelle Fanny, MD  omeprazole (PRILOSEC OTC) 20 MG tablet Take 1 tablet (20 mg total) by mouth daily. 12/06/13 06/14/14  TowerWynelle Fanny, MD    Family History Family History  Problem Relation Age of Onset  . Cancer Brother        prostate CA  . Heart disease Mother   . Stroke Father   . Kidney disease Sister   . Kidney disease Sister   . Clotting disorder Sister   . COPD Brother   . Heart disease Brother     Social History Social History   Tobacco Use  . Smoking status: Never  . Smokeless tobacco: Never  Vaping Use  . Vaping Use: Never used  Substance Use Topics  . Alcohol use: No    Alcohol/week: 0.0 standard drinks of alcohol  . Drug use: No     Allergies   Adhesive [tape], Azithromycin, Celecoxib, and Zocor [simvastatin]   Review of Systems Review of Systems   Physical Exam Triage Vital Signs ED Triage Vitals  Enc Vitals Group     BP      Pulse      Resp      Temp      Temp src      SpO2      Weight      Height      Head Circumference      Peak Flow      Pain Score      Pain Loc      Pain Edu?      Excl. in [place]?    No data found.  Updated Vital Signs There were no vitals taken for this visit.  Visual Acuity Right Eye Distance:   Left Eye Distance:    Bilateral Distance:    Right Eye Near:   Left Eye Near:    Bilateral Near:     Physical Exam   UC Treatments / Results  Labs (all labs ordered are listed, but  only abnormal results are displayed) Labs Reviewed - No data to display  EKG   Radiology No results found.  Procedures Procedures (including critical care time)  Medications Ordered in UC Medications - No data to display  Initial Impression / Assessment and Plan / UC Course  I have reviewed the triage vital signs and the nursing notes.  Pertinent labs & imaging results that were available during my care of the patient were reviewed by me and considered in my medical decision making (see chart for details).     *** Final Clinical Impressions(s) / UC Diagnoses   Final diagnoses:  None   Discharge Instructions   None    ED Prescriptions   None    PDMP not reviewed this encounter.

## 2022-05-30 NOTE — ED Triage Notes (Signed)
Patient presents to Urgent Care with complaints of left knee injury today. He states he was hopping off his mower and thinks he pulled something. Pt states his left knee gave out. Not taking anything for pain. Pain only when walking or moving knee.

## 2022-05-30 NOTE — Discharge Instructions (Addendum)
Wear knee brace for comfort. Take tylenol 500 mg for pain as needed. I will notify you once I have received radiologist reports from the x-ray.  If your knee pain worsens or does not improve within the next 3 days follow-up with [facility] walk-in urgent care.

## 2022-06-06 ENCOUNTER — Other Ambulatory Visit: Payer: Self-pay | Admitting: Family Medicine

## 2022-06-12 ENCOUNTER — Ambulatory Visit: Payer: Medicare PPO | Admitting: Family Medicine

## 2022-06-13 ENCOUNTER — Ambulatory Visit: Payer: Medicare PPO | Admitting: Family Medicine

## 2022-06-13 ENCOUNTER — Encounter: Payer: Self-pay | Admitting: Family Medicine

## 2022-06-13 VITALS — BP 134/70 | HR 70 | Ht 66.5 in | Wt 155.8 lb

## 2022-06-13 DIAGNOSIS — N183 Chronic kidney disease, stage 3 unspecified: Secondary | ICD-10-CM | POA: Diagnosis not present

## 2022-06-13 DIAGNOSIS — I10 Essential (primary) hypertension: Secondary | ICD-10-CM

## 2022-06-13 DIAGNOSIS — Z794 Long term (current) use of insulin: Secondary | ICD-10-CM

## 2022-06-13 DIAGNOSIS — E1122 Type 2 diabetes mellitus with diabetic chronic kidney disease: Secondary | ICD-10-CM | POA: Diagnosis not present

## 2022-06-13 DIAGNOSIS — N1832 Chronic kidney disease, stage 3b: Secondary | ICD-10-CM

## 2022-06-13 DIAGNOSIS — E039 Hypothyroidism, unspecified: Secondary | ICD-10-CM | POA: Diagnosis not present

## 2022-06-13 DIAGNOSIS — S40021A Contusion of right upper arm, initial encounter: Secondary | ICD-10-CM | POA: Insufficient documentation

## 2022-06-13 LAB — BASIC METABOLIC PANEL
BUN: 41 mg/dL — ABNORMAL HIGH (ref 6–23)
CO2: 29 mEq/L (ref 19–32)
Calcium: 9.6 mg/dL (ref 8.4–10.5)
Chloride: 104 mEq/L (ref 96–112)
Creatinine, Ser: 1.91 mg/dL — ABNORMAL HIGH (ref 0.40–1.50)
GFR: 29.75 mL/min — ABNORMAL LOW (ref >60.00)
Glucose, Bld: 248 mg/dL — ABNORMAL HIGH (ref 70–99)
Potassium: 4.7 mEq/L (ref 3.5–5.1)
Sodium: 140 mEq/L (ref 135–145)

## 2022-06-13 LAB — TSH: TSH: 2.61 u[IU]/mL (ref 0.35–5.50)

## 2022-06-13 LAB — HEMOGLOBIN A1C: Hgb A1c MFr Bld: 7.6 % — ABNORMAL HIGH (ref 4.6–6.5)

## 2022-06-13 NOTE — Assessment & Plan Note (Signed)
Reviewed last f/u with nephrology   bmet today  Drinking fluids

## 2022-10-20 ENCOUNTER — Other Ambulatory Visit: Payer: Self-pay | Admitting: Family Medicine

## 2022-10-20 DIAGNOSIS — Z0100 Encounter for examination of eyes and vision without abnormal findings: Secondary | ICD-10-CM | POA: Diagnosis not present

## 2022-10-20 DIAGNOSIS — H35373 Puckering of macula, bilateral: Secondary | ICD-10-CM | POA: Diagnosis not present

## 2022-10-20 DIAGNOSIS — Z961 Presence of intraocular lens: Secondary | ICD-10-CM | POA: Diagnosis not present

## 2022-10-20 DIAGNOSIS — E113293 Type 2 diabetes mellitus with mild nonproliferative diabetic retinopathy without macular edema, bilateral: Secondary | ICD-10-CM | POA: Diagnosis not present

## 2022-10-20 LAB — HM DIABETES EYE EXAM

## 2022-10-20 LAB — HM DIABETES FOOT EXAM

## 2022-10-20 NOTE — Telephone Encounter (Signed)
Daughter came by and dropped off update insurance information ,to let Dr Glori Bickers know which medication would be covered under his insurance. Letter left in her folder up front.

## 2022-10-20 NOTE — Telephone Encounter (Signed)
Form in your inbox 

## 2022-10-23 MED ORDER — LANTUS SOLOSTAR 100 UNIT/ML ~~LOC~~ SOPN
15.0000 [IU] | PEN_INJECTOR | Freq: Every day | SUBCUTANEOUS | 1 refills | Status: DC
Start: 2022-10-23 — End: 2023-01-22

## 2022-10-23 NOTE — Addendum Note (Signed)
Addended by: Loura Pardon A on: 10/23/2022 12:30 PM   Modules accepted: Orders

## 2022-10-23 NOTE — Telephone Encounter (Signed)
**  DID A NO-PRINT RX** NOT SENT TO PHARMACY**  Spoke to pt and letter said that the change will take effect 12/22/22 and pt doesn't want anything changed until then. I advised them to call us back once he is ready for [place] to change Rx and we will send it in then. Med list updated but no new Rx was sent in

## 2022-10-23 NOTE — Telephone Encounter (Signed)
We will change from levemir to lantus - staying at the 15 u daily  Please send to pharm of choice  If any problems let us know   I was unable to specify BD needles so that may need to be called in  Paper is in IN box

## 2022-10-23 NOTE — Addendum Note (Signed)
Addended by: Tammi Sou on: 10/23/2022 03:33 PM   Modules accepted: Orders

## 2022-10-25 NOTE — Telephone Encounter (Signed)
Erroneous

## 2022-11-05 ENCOUNTER — Encounter: Payer: Self-pay | Admitting: Family Medicine

## 2022-11-10 DIAGNOSIS — N1832 Chronic kidney disease, stage 3b: Secondary | ICD-10-CM | POA: Diagnosis not present

## 2022-11-10 DIAGNOSIS — I10 Essential (primary) hypertension: Secondary | ICD-10-CM | POA: Diagnosis not present

## 2022-11-10 DIAGNOSIS — E1129 Type 2 diabetes mellitus with other diabetic kidney complication: Secondary | ICD-10-CM | POA: Diagnosis not present

## 2022-11-10 DIAGNOSIS — N2581 Secondary hyperparathyroidism of renal origin: Secondary | ICD-10-CM | POA: Diagnosis not present

## 2022-11-17 DIAGNOSIS — N2581 Secondary hyperparathyroidism of renal origin: Secondary | ICD-10-CM | POA: Diagnosis not present

## 2022-11-17 DIAGNOSIS — E1129 Type 2 diabetes mellitus with other diabetic kidney complication: Secondary | ICD-10-CM | POA: Diagnosis not present

## 2022-11-17 DIAGNOSIS — N1832 Chronic kidney disease, stage 3b: Secondary | ICD-10-CM | POA: Diagnosis not present

## 2022-11-17 DIAGNOSIS — I10 Essential (primary) hypertension: Secondary | ICD-10-CM | POA: Diagnosis not present

## 2022-11-25 ENCOUNTER — Encounter: Payer: Self-pay | Admitting: Family Medicine

## 2022-11-27 IMAGING — CT CT RENAL STONE PROTOCOL
2 of 4 series · 16 of 46 positions shown, 18 images · non-contrast
Comparison: CT 06/10/2013

CLINICAL DATA: Hematuria flank pain

EXAM:
CT ABDOMEN AND PELVIS WITHOUT CONTRAST
TECHNIQUE: Multidetector CT imaging of the abdomen and pelvis was performed
following the standard protocol without IV contrast.

[Series 2: stone full standard · axial · 0.75mm/px · z∈[-966,-570]mm · 13 of 87 slices shown, 15 images]
[im 4/87  soft-tissue]
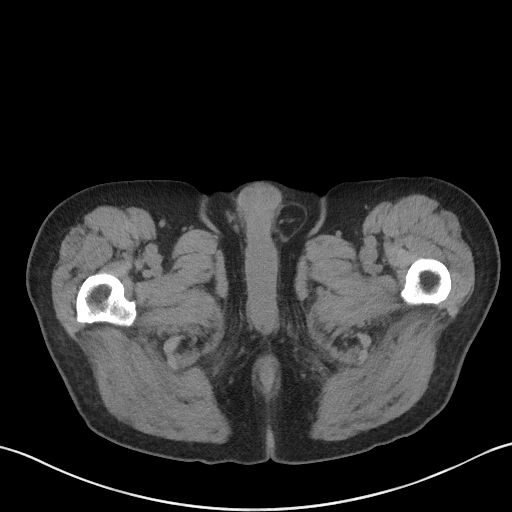
[im 4/87  bone]
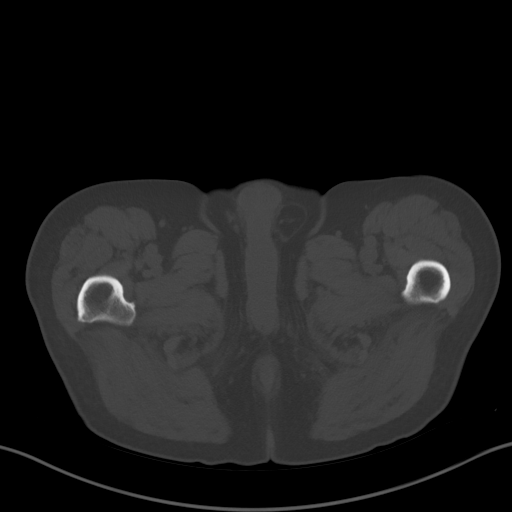
[im 11/87  soft-tissue]
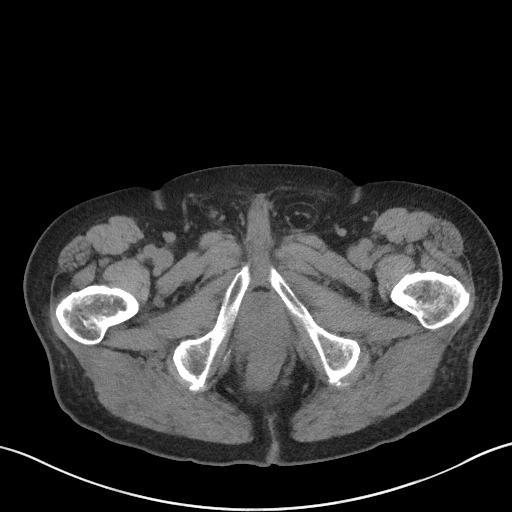
[im 18/87  soft-tissue]
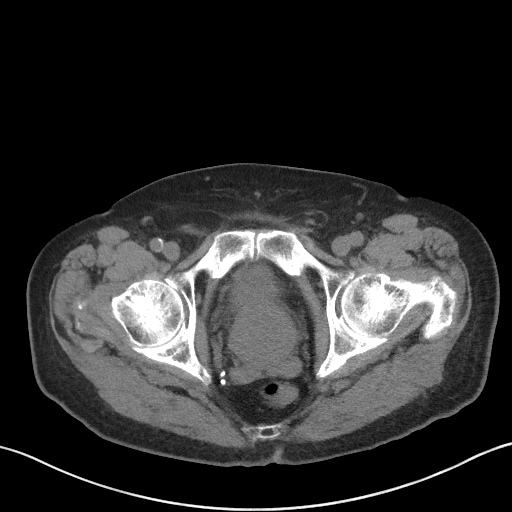
[im 25/87  soft-tissue]
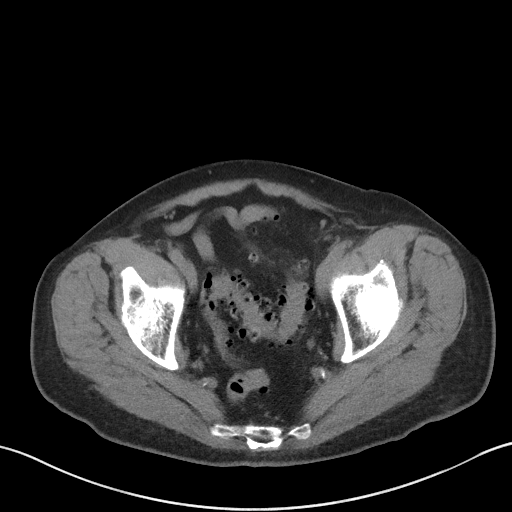
[im 31/87  soft-tissue]
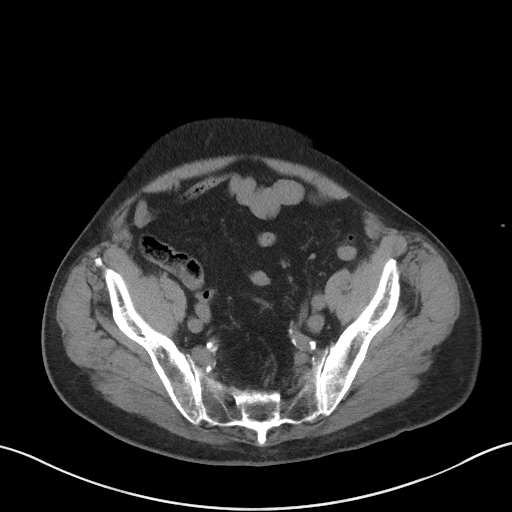
[im 38/87  soft-tissue]
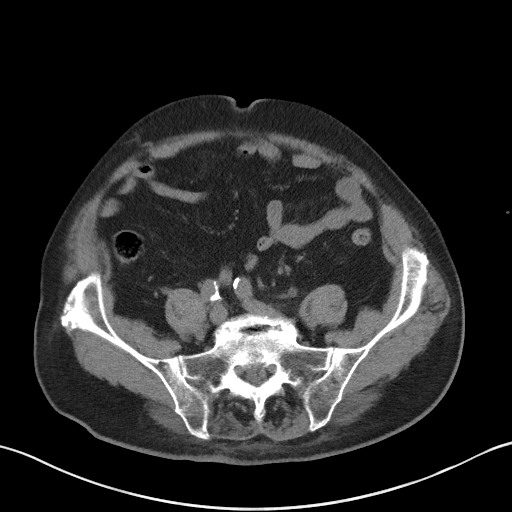
[im 45/87  soft-tissue]
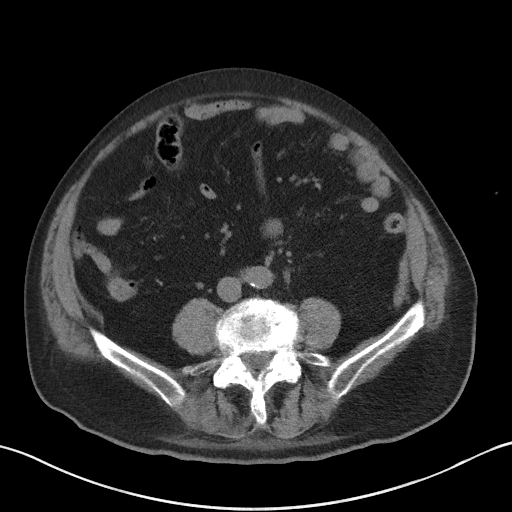
[im 49/87  soft-tissue]
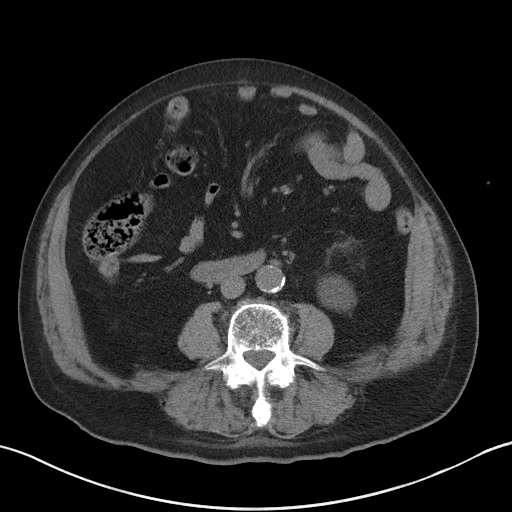
[im 56/87  soft-tissue]
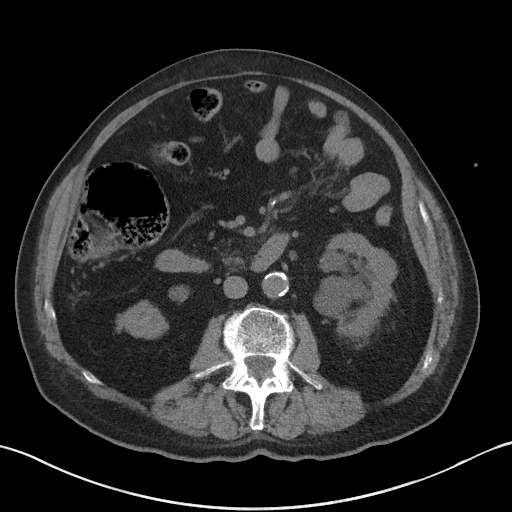
[im 56/87  bone]
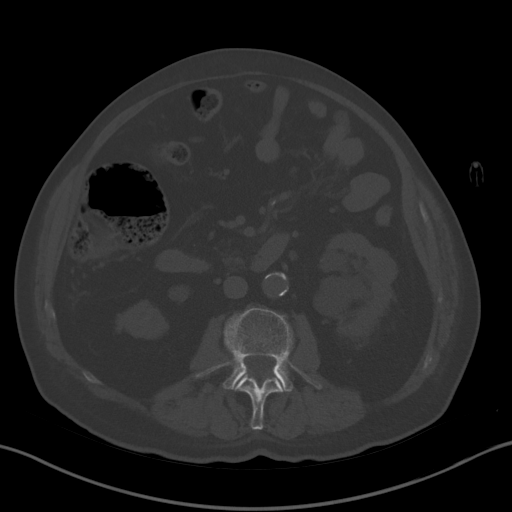
[im 62/87  soft-tissue]
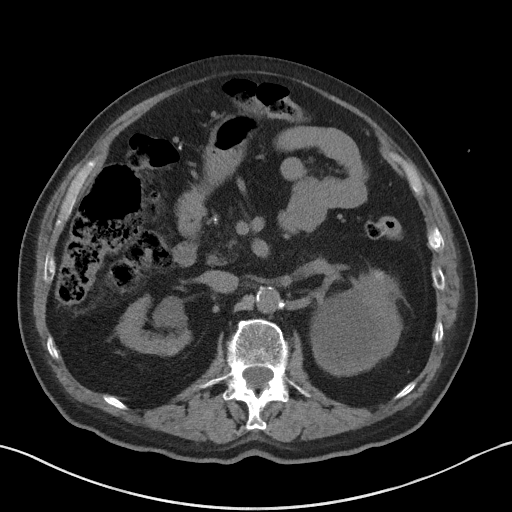
[im 69/87  soft-tissue]
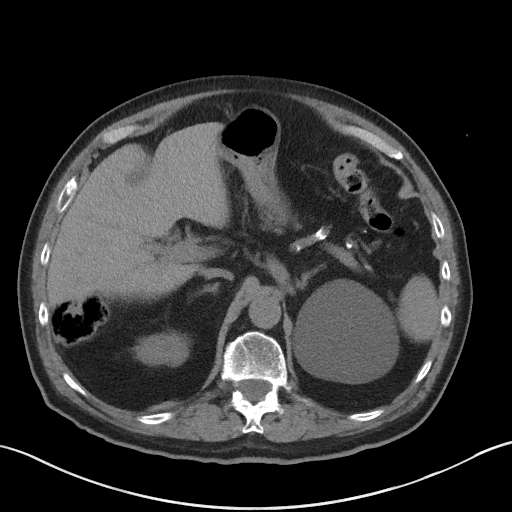
[im 76/87  soft-tissue]
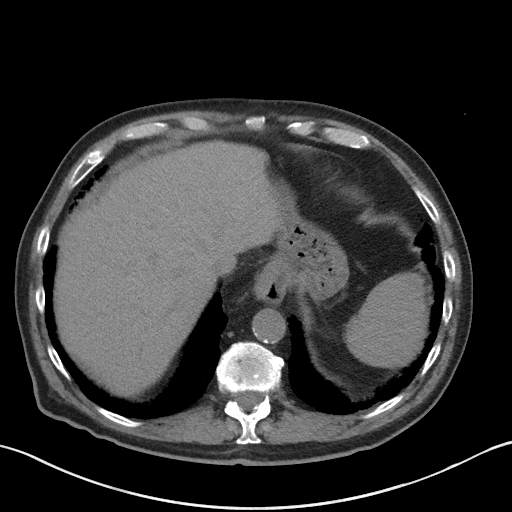
[im 83/87  soft-tissue]
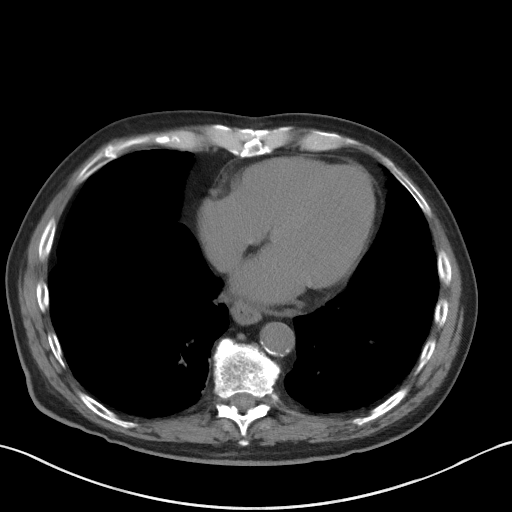

[Series 5: coronal · coronal · 0.72mm/px · 3 of 158 slices shown]
[im 53/158  soft-tissue]
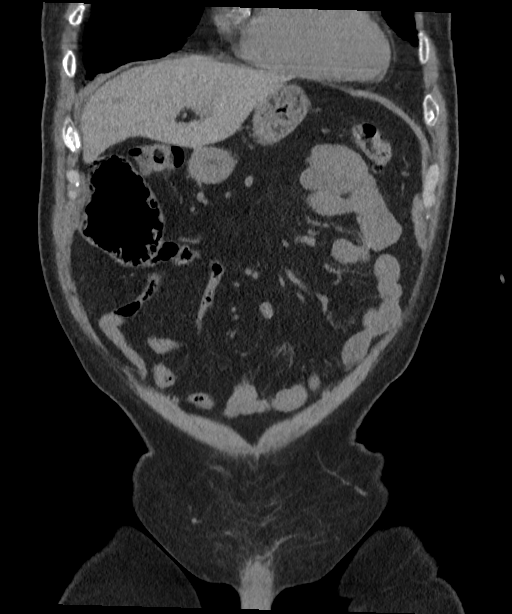
[im 70/158  soft-tissue]
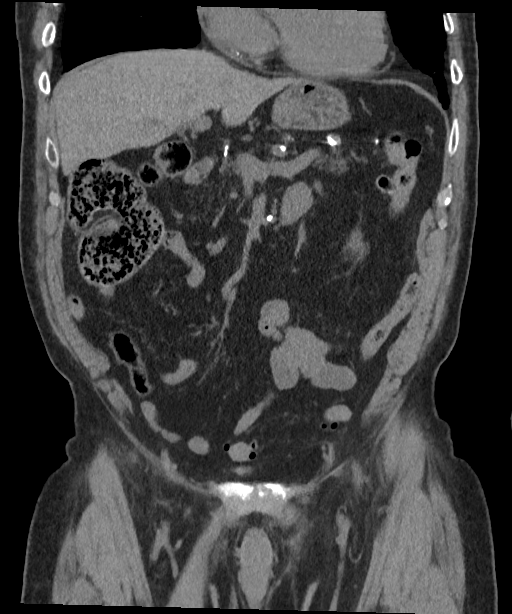
[im 88/158  soft-tissue]
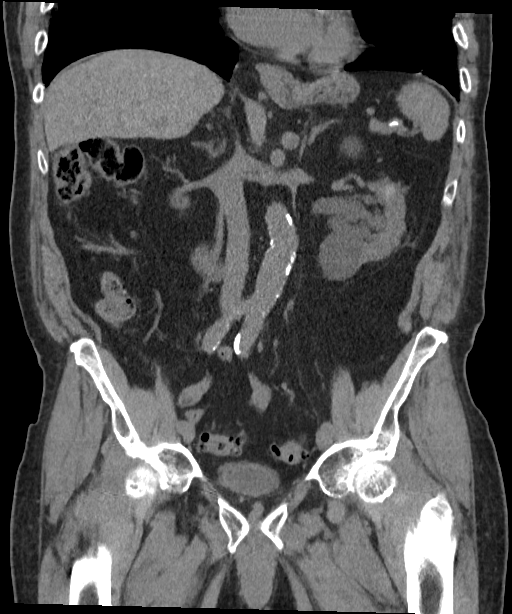

[16 of 46 positions shown; findings below may reference images not displayed]

FINDINGS: Lower chest: Lung bases demonstrate no acute consolidation or
effusion. Mild subpleural fibrosis on the right. Borderline cardiac
size. Small pericardial effusion. Small hiatal hernia.

Hepatobiliary: No focal liver abnormality is seen. Status post
cholecystectomy. No biliary dilatation.

Pancreas: Unremarkable. No pancreatic ductal dilatation or
surrounding inflammatory changes. Fatty atrophy

Spleen: Normal in size without focal abnormality.

Adrenals/Urinary Tract: Adrenal glands are normal. 7.6 cm cyst
exophytic to the upper pole left kidney. Subcentimeter cortical
hyperdensity mid right kidney without change and likely representing
hemorrhagic or proteinaceous cyst. Bilateral parapelvic cysts. Mild
to moderate hydronephrosis on the left with hydroureter. This is
secondary to a 3 mm stone in the distal left ureter about a cm
proximal to the left UVJ. Bladder is nearly empty

Stomach/Bowel: Stomach is within normal limits. Status post
appendectomy. Extensive sigmoid colon diverticular disease without
acute inflammatory change. No evidence of bowel wall thickening,
distention, or inflammatory changes.

Vascular/Lymphatic: Nonaneurysmal aorta. Moderate aortic
atherosclerosis. No suspicious nodes.

Reproductive: Enlarged prostate gland

Other: Negative for free air or free fluid. Small fat containing
left inguinal hernia.

Musculoskeletal: Degenerative changes of the spine. No acute osseous
abnormality
IMPRESSION: 1. Mild to moderate left hydronephrosis and hydroureter secondary to
a 3 mm stone in the distal left ureter about a cm proximal to the
left UVJ.
2. Extensive sigmoid colon diverticular disease without acute
inflammatory change.
3. Multiple bilateral renal cysts including parapelvic cysts.

Aortic Atherosclerosis ([id number].N).

## 2022-12-02 ENCOUNTER — Other Ambulatory Visit: Payer: Self-pay | Admitting: Family Medicine

## 2022-12-08 ENCOUNTER — Other Ambulatory Visit: Payer: Self-pay | Admitting: Family Medicine

## 2023-01-22 ENCOUNTER — Telehealth: Payer: Self-pay | Admitting: Family Medicine

## 2023-01-22 MED ORDER — LANTUS SOLOSTAR 100 UNIT/ML ~~LOC~~ SOPN: 15.0000 [IU] | PEN_INJECTOR | Freq: Every day | SUBCUTANEOUS | 3 refills | Status: DC

## 2023-01-22 NOTE — Addendum Note (Signed)
Addended by: Loura Pardon A on: 01/22/2023 04:01 PM   Modules accepted: Orders

## 2023-01-22 NOTE — Telephone Encounter (Signed)
Lantus sent

## 2023-01-22 NOTE — Telephone Encounter (Signed)
Called in on behalf of patient ,patient would like to know could he be switched from levemire insulin to lantus,because of price through insurance?

## 2023-01-22 NOTE — Telephone Encounter (Signed)
Spoke with pt and spouse and she told me that levemire isn't covered at all this year (they received a letter) and they are okay switching to lantus if that's what the pharmacy said is covered. Pt said he hasn't had any problems with hypoglycemia his numbers have been a little high because he has been in the house mostly during the winter. Pt has one more vile of levemire and will pick up lantus once that runs out. Please send Rx to [facility]

## 2023-01-22 NOTE — Telephone Encounter (Signed)
Yes, to [facility]?  When does he run out of what he has?  Any hypoglycemia or blood sugar changes recently?  Thanks

## 2023-01-29 DIAGNOSIS — I2510 Atherosclerotic heart disease of native coronary artery without angina pectoris: Secondary | ICD-10-CM | POA: Diagnosis not present

## 2023-01-29 DIAGNOSIS — I340 Nonrheumatic mitral (valve) insufficiency: Secondary | ICD-10-CM | POA: Diagnosis not present

## 2023-01-29 DIAGNOSIS — I10 Essential (primary) hypertension: Secondary | ICD-10-CM | POA: Diagnosis not present

## 2023-01-29 DIAGNOSIS — I4510 Unspecified right bundle-branch block: Secondary | ICD-10-CM | POA: Diagnosis not present

## 2023-01-29 DIAGNOSIS — R001 Bradycardia, unspecified: Secondary | ICD-10-CM | POA: Diagnosis not present

## 2023-01-29 DIAGNOSIS — N1831 Chronic kidney disease, stage 3a: Secondary | ICD-10-CM | POA: Diagnosis not present

## 2023-01-29 DIAGNOSIS — I77810 Thoracic aortic ectasia: Secondary | ICD-10-CM | POA: Diagnosis not present

## 2023-03-03 ENCOUNTER — Other Ambulatory Visit: Payer: Self-pay | Admitting: Family Medicine

## 2023-03-08 ENCOUNTER — Telehealth: Payer: Self-pay | Admitting: Family Medicine

## 2023-03-08 DIAGNOSIS — E039 Hypothyroidism, unspecified: Secondary | ICD-10-CM

## 2023-03-08 DIAGNOSIS — E1122 Type 2 diabetes mellitus with diabetic chronic kidney disease: Secondary | ICD-10-CM

## 2023-03-08 DIAGNOSIS — N1832 Chronic kidney disease, stage 3b: Secondary | ICD-10-CM

## 2023-03-08 DIAGNOSIS — D649 Anemia, unspecified: Secondary | ICD-10-CM

## 2023-03-08 DIAGNOSIS — E1169 Type 2 diabetes mellitus with other specified complication: Secondary | ICD-10-CM

## 2023-03-08 DIAGNOSIS — I10 Essential (primary) hypertension: Secondary | ICD-10-CM

## 2023-03-08 NOTE — Telephone Encounter (Signed)
-----   Message from Velna Hatchet, RT sent at 02/23/2023  1:42 PM EST ----- Regarding: Mon 3/18 lab Patient is scheduled for cpx, please order future labs.  Thanks, Anda Kraft

## 2023-03-09 ENCOUNTER — Other Ambulatory Visit (INDEPENDENT_AMBULATORY_CARE_PROVIDER_SITE_OTHER): Payer: Medicare PPO

## 2023-03-09 DIAGNOSIS — E1122 Type 2 diabetes mellitus with diabetic chronic kidney disease: Secondary | ICD-10-CM

## 2023-03-09 DIAGNOSIS — I10 Essential (primary) hypertension: Secondary | ICD-10-CM | POA: Diagnosis not present

## 2023-03-09 DIAGNOSIS — E785 Hyperlipidemia, unspecified: Secondary | ICD-10-CM | POA: Diagnosis not present

## 2023-03-09 DIAGNOSIS — N1832 Chronic kidney disease, stage 3b: Secondary | ICD-10-CM | POA: Diagnosis not present

## 2023-03-09 DIAGNOSIS — N183 Chronic kidney disease, stage 3 unspecified: Secondary | ICD-10-CM

## 2023-03-09 DIAGNOSIS — D649 Anemia, unspecified: Secondary | ICD-10-CM

## 2023-03-09 DIAGNOSIS — E1169 Type 2 diabetes mellitus with other specified complication: Secondary | ICD-10-CM | POA: Diagnosis not present

## 2023-03-09 DIAGNOSIS — Z794 Long term (current) use of insulin: Secondary | ICD-10-CM | POA: Diagnosis not present

## 2023-03-09 DIAGNOSIS — E039 Hypothyroidism, unspecified: Secondary | ICD-10-CM | POA: Diagnosis not present

## 2023-03-09 LAB — VITAMIN D 25 HYDROXY (VIT D DEFICIENCY, FRACTURES): VITD: 51.1 ng/mL (ref 30.00–100.00)

## 2023-03-09 LAB — LIPID PANEL
Cholesterol: 122 mg/dL (ref 0–200)
HDL: 45.8 mg/dL (ref >39.00)
LDL Cholesterol: 59 mg/dL (ref 0–99)
NonHDL: 76.38
Total CHOL/HDL Ratio: 3
Triglycerides: 88 mg/dL (ref 0.0–149.0)
VLDL: 17.6 mg/dL (ref 0.0–40.0)

## 2023-03-09 LAB — CBC WITH DIFFERENTIAL/PLATELET
Basophils Absolute: 0.1 10*3/uL (ref 0.0–0.1)
Basophils Relative: 0.8 % (ref 0.0–3.0)
Eosinophils Absolute: 0.2 10*3/uL (ref 0.0–0.7)
Eosinophils Relative: 2.1 % (ref 0.0–5.0)
HCT: 36.8 % — ABNORMAL LOW (ref 39.0–52.0)
Hemoglobin: 12.3 g/dL — ABNORMAL LOW (ref 13.0–17.0)
Lymphocytes Relative: 21.7 % (ref 12.0–46.0)
Lymphs Abs: 1.9 10*3/uL (ref 0.7–4.0)
MCHC: 33.3 g/dL (ref 30.0–36.0)
MCV: 103.2 fl — ABNORMAL HIGH (ref 78.0–100.0)
Monocytes Absolute: 0.8 10*3/uL (ref 0.1–1.0)
Monocytes Relative: 9.2 % (ref 3.0–12.0)
Neutro Abs: 5.7 10*3/uL (ref 1.4–7.7)
Neutrophils Relative %: 66.2 % (ref 43.0–77.0)
Platelets: 224 10*3/uL (ref 150.0–400.0)
RBC: 3.56 Mil/uL — ABNORMAL LOW (ref 4.22–5.81)
RDW: 14.2 % (ref 11.5–15.5)
WBC: 8.7 10*3/uL (ref 4.0–10.5)

## 2023-03-09 LAB — COMPREHENSIVE METABOLIC PANEL
ALT: 23 U/L (ref 0–53)
AST: 21 U/L (ref 0–37)
Albumin: 4 g/dL (ref 3.5–5.2)
Alkaline Phosphatase: 76 U/L (ref 39–117)
BUN: 33 mg/dL — ABNORMAL HIGH (ref 6–23)
CO2: 27 mEq/L (ref 19–32)
Calcium: 9.6 mg/dL (ref 8.4–10.5)
Chloride: 105 mEq/L (ref 96–112)
Creatinine, Ser: 1.88 mg/dL — ABNORMAL HIGH (ref 0.40–1.50)
GFR: 30.17 mL/min — ABNORMAL LOW (ref >60.00)
Glucose, Bld: 98 mg/dL (ref 70–99)
Potassium: 4.2 mEq/L (ref 3.5–5.1)
Sodium: 142 mEq/L (ref 135–145)
Total Bilirubin: 1 mg/dL (ref 0.2–1.2)
Total Protein: 6.7 g/dL (ref 6.0–8.3)

## 2023-03-09 LAB — HEMOGLOBIN A1C: Hgb A1c MFr Bld: 7.8 % — ABNORMAL HIGH (ref 4.6–6.5)

## 2023-03-09 LAB — IRON: Iron: 103 ug/dL (ref 42–165)

## 2023-03-09 LAB — MICROALBUMIN / CREATININE URINE RATIO
Creatinine,U: 32.4 mg/dL
Microalb Creat Ratio: 2.2 mg/g (ref 0.0–30.0)
Microalb, Ur: <0.7 mg/dL (ref 0.0–1.9)

## 2023-03-09 LAB — TSH: TSH: 2.09 u[IU]/mL (ref 0.35–5.50)

## 2023-03-16 ENCOUNTER — Ambulatory Visit (INDEPENDENT_AMBULATORY_CARE_PROVIDER_SITE_OTHER): Payer: Medicare PPO | Admitting: Family Medicine

## 2023-03-16 ENCOUNTER — Encounter: Payer: Medicare PPO | Admitting: Family Medicine

## 2023-03-16 ENCOUNTER — Encounter: Payer: Self-pay | Admitting: Family Medicine

## 2023-03-16 VITALS — BP 128/64 | HR 68 | Temp 97.3°F | Ht 66.5 in | Wt 155.0 lb

## 2023-03-16 DIAGNOSIS — K219 Gastro-esophageal reflux disease without esophagitis: Secondary | ICD-10-CM

## 2023-03-16 DIAGNOSIS — Z0000 Encounter for general adult medical examination without abnormal findings: Secondary | ICD-10-CM | POA: Diagnosis not present

## 2023-03-16 DIAGNOSIS — Z794 Long term (current) use of insulin: Secondary | ICD-10-CM | POA: Diagnosis not present

## 2023-03-16 DIAGNOSIS — I10 Essential (primary) hypertension: Secondary | ICD-10-CM

## 2023-03-16 DIAGNOSIS — D649 Anemia, unspecified: Secondary | ICD-10-CM

## 2023-03-16 DIAGNOSIS — B070 Plantar wart: Secondary | ICD-10-CM

## 2023-03-16 DIAGNOSIS — N183 Chronic kidney disease, stage 3 unspecified: Secondary | ICD-10-CM

## 2023-03-16 DIAGNOSIS — N1832 Chronic kidney disease, stage 3b: Secondary | ICD-10-CM

## 2023-03-16 DIAGNOSIS — E1122 Type 2 diabetes mellitus with diabetic chronic kidney disease: Secondary | ICD-10-CM | POA: Diagnosis not present

## 2023-03-16 MED ORDER — AMLODIPINE BESYLATE 5 MG PO TABS: 2.5000 mg | ORAL_TABLET | Freq: Every day | ORAL | 0 refills | Status: DC

## 2023-03-16 NOTE — Assessment & Plan Note (Signed)
anemia of chronic disease ?In the setting of CKD ?Hemoglobin is stable at 12.3 ?Patient takes ferrous sulfate every other day ?

## 2023-03-16 NOTE — Assessment & Plan Note (Signed)
Lab Results  Component Value Date   HGBA1C 7.8 (H) 03/09/2023   Struggling to keep am glucose levels from being low Enc bedtime snack with protein  Watch sugar intake  Some issues with needles for his basal insulin/ins cov  Takes lantus 15 u daily  Amaryl 4 mg bid  Januvia 50 mg daily  Gets microalb from nephrology  Good foot care  On a statin   Plan to ref to pharmacy for help with blood sugars  F/u 3 mo

## 2023-03-16 NOTE — Progress Notes (Signed)
Subjective:    Patient ID: David Henry, male    DOB: 1926-06-05, 89 y.o.   MRN: [id number]  HPI Here for health maintenance exam and to review chronic medical problems    Wt Readings from Last 3 Encounters:  03/16/23 155 lb (70.3 kg)  06/13/22 155 lb 12.8 oz (70.7 kg)  03/12/22 154 lb (69.9 kg)   24.64 kg/m  Vitals:   03/16/23 0858  BP: 128/64  Pulse: 68  Temp: (!) 97.3 F (36.3 C)  SpO2: 100%   Slowing down a lot with age  Is hard  Balance is not great   Still rides to cut grass  Gets some help with family    Immunization History  Administered Date(s) Administered   Fluad Quad(high Dose 65+) 09/02/2019, 10/11/2019   H1N1 12/28/2008   Influenza Split 10/23/2011, 09/15/2012   Influenza Whole 09/22/1999, 10/01/2007, 10/02/2008, 09/27/2009, 10/09/2010   Influenza, Quadrivalent, Recombinant, Inj, Pf 09/22/2017   Influenza, Seasonal, Injecte, Preservative Fre 09/16/2018   Influenza,inj,Quad PF,6+ Mos 09/13/2014, 09/17/2015, 09/12/2016   Influenza-Unspecified 09/20/2013, 10/01/2020, 09/21/2022   PFIZER(Purple Top)SARS-COV-2 Vaccination 01/17/2020, 01/28/2020, 10/15/2020   Pneumococcal Conjugate-13 12/08/2014   Pneumococcal Polysaccharide-23 07/23/2003   Td 12/04/1999, 03/01/2008   Tdap 05/20/2011, 07/29/2018   Zoster, Live 03/31/2015   Health Maintenance Due  Topic Date Due   Medicare Annual Wellness (AWV)  03/13/2023   Eye exam 09/2022   Prostate health  Lab Results  Component Value Date   PSA 2.09 05/13/2011   PSA 1.83 08/18/2005   Takes flomax for BPH  Baseline frequency of urination/ sometimes urgency    Had one fall last summer in grass Was fine=no injuries       03/16/2023    8:57 AM 06/13/2022    9:10 AM 03/12/2022    8:58 AM 10/14/2021    3:59 PM 10/08/2020    8:51 AM  Depression screen PHQ 2/9  Decreased Interest 0 0 0 0 0  Down, Depressed, Hopeless 0 0 0 0 0  PHQ - 2 Score 0 0 0 0 0      HTN bp is stable today  No cp or  palpitations or headaches or edema  No side effects to medicines  BP Readings from Last 3 Encounters:  03/16/23 128/64  06/13/22 134/70  05/30/22 (!) 151/102    Cutting amlodipine 5 mg in 1/2   Nephrology care for CKD GFR 30.1  Last vitamin D Lab Results  Component Value Date   VD25OH 51.10 [id number]     Last metabolic panel Lab Results  Component Value Date   GLUCOSE 98 03/09/2023   NA 142 03/09/2023   K 4.2 03/09/2023   CL 105 03/09/2023   CO2 27 03/09/2023   BUN 33 (H) 03/09/2023   CREATININE 1.88 (H) 03/09/2023   GFRNONAA 30 (L) 03/28/2021   CALCIUM 9.6 03/09/2023   PHOS 3.6 01/18/2018   PROT 6.7 03/09/2023   ALBUMIN 4.0 03/09/2023   BILITOT 1.0 03/09/2023   ALKPHOS 76 03/09/2023   AST 21 03/09/2023   ALT 23 03/09/2023   ANIONGAP 10 03/28/2021   GERD Protonix = working very well   DM2 Lab Results  Component Value Date   HGBA1C 7.8 (H) 03/09/2023   Glucose was 65 this am Stays 70s usually   Eating all he can  Eats occ cookie / some sweets Odd nutra grain bars    Lantus 15 u  Amaryl 4 mg bid  Januvia 50 mg daily   Gets  microalb from nephrology  Having trouble with lantus and needles (pen is ok)  Gave [place]    Hyperlipidemia Lab Results  Component Value Date   CHOL 122 03/09/2023   CHOL 115 03/05/2022   CHOL 104 10/05/2020   Lab Results  Component Value Date   HDL 45.80 03/09/2023   HDL 40.40 03/05/2022   HDL 43.90 10/05/2020   Lab Results  Component Value Date   LDLCALC 59 03/09/2023   LDLCALC 55 03/05/2022   LDLCALC 47 10/05/2020   Lab Results  Component Value Date   TRIG 88.0 03/09/2023   TRIG 97.0 03/05/2022   TRIG 64.0 10/05/2020   Lab Results  Component Value Date   CHOLHDL 3 03/09/2023   CHOLHDL 3 03/05/2022   CHOLHDL 2 10/05/2020   No results found for: "LDLDIRECT"  Crestor 5 mg every other day  Hypothyroidism  Pt has no clinical changes No change in energy level/ hair or skin/ edema and no tremor Lab  Results  Component Value Date   TSH 2.09 03/09/2023      Anemia of chronic dz Lab Results  Component Value Date   WBC 8.7 03/09/2023   HGB 12.3 (L) 03/09/2023   HCT 36.8 (L) 03/09/2023   MCV 103.2 (H) 03/09/2023   PLT 224.0 03/09/2023   Lab Results  Component Value Date   IRON 103 03/09/2023   TIBC 160 (L) 03/27/2021   FERRITIN 201 03/27/2021    Patient Active Problem List   Diagnosis Date Noted   Plantar wart, left foot 03/16/2023   Renal stone 03/22/2021   Macrocytosis 10/05/2019   Routine general medical examination at a health care facility 03/09/2016   Constipation 09/17/2015   Encounter for Medicare annual wellness exam 12/08/2014   Chronic kidney disease (CKD) 05/24/2013   Anemia 11/23/2012   Hypothyroid 11/23/2012   GERD 11/29/2010   Coronary atherosclerosis 04/18/2007   Controlled type 2 diabetes mellitus with chronic kidney disease ([place]) 04/15/2007   Hyperlipidemia associated with type 2 diabetes mellitus ([place]) 04/15/2007   Essential hypertension 04/15/2007   MYOCARDIAL INFARCTION, HX OF 04/15/2007   ACTINIC KERATOSIS 04/15/2007   Past Medical History:  Diagnosis Date   Arthritis    Basal cell carcinoma 08/15/2021   right cheek, EDC   CAD (coronary artery disease)    3 stents   Colon polyp    Diabetes mellitus    type II   Dyspnea    with exertion   GERD (gastroesophageal reflux disease)    Hyperlipidemia    Hypertension    Hypothyroidism    Kidney stones    stage III CKD   Neuropathy    Peripheral vascular disease ([place])    neuropathy in feet   Past Surgical History:  Procedure Laterality Date   APPENDECTOMY  1984   CATARACT EXTRACTION Bilateral 04/2001   CHOLECYSTECTOMY  1989   COLONOSCOPY N/A 2/14   hemorrhoids and tics (after pos ifob card)   CORONARY ANGIOPLASTY  2006   3 stents   CYSTOSCOPY/URETEROSCOPY/HOLMIUM LASER/STENT PLACEMENT Left 03/24/2021   Procedure: CYSTOSCOPY/URETEROSCOPY, RETROGRADE AND /STENT PLACEMENT;  Surgeon:  [name], MD;  Location: [facility] ORS;  Service: Urology;  Laterality: Left;   ESOPHAGOGASTRODUODENOSCOPY N/A 2/14   RETINAL DETACHMENT SURGERY Right 2003   SHOULDER ARTHROSCOPY WITH ROTATOR CUFF REPAIR AND SUBACROMIAL DECOMPRESSION Right 12/30/2017   Procedure: SHOULDER ARTHROSCOPY WITH ROTATOR CUFF REPAIR AND SUBACROMIAL DECOMPRESSION;  Surgeon: Leanor Kail, MD;  Location: [facility] ORS;  Service: Orthopedics;  Laterality: Right;  SHOULDER SURGERY  2012   Lt shoulder repair   Social History   Tobacco Use   Smoking status: Never   Smokeless tobacco: Never  Vaping Use   Vaping Use: Never used  Substance Use Topics   Alcohol use: No    Alcohol/week: 0.0 standard drinks of alcohol   Drug use: No   Family History  Problem Relation Age of Onset   Cancer Brother        prostate CA   Heart disease Mother    Stroke Father    Kidney disease Sister    Kidney disease Sister    Clotting disorder Sister    COPD Brother    Heart disease Brother    Allergies  Allergen Reactions   Adhesive [Tape] Dermatitis    Skin is thin so it is tough to take off.  Use paper tape   Azithromycin Nausea Only    REACTION: nausea   Celecoxib Other (See Comments)    Raises blood pressure   Zocor [Simvastatin] Other (See Comments)    Muscle pain   Current Outpatient Medications on File Prior to Visit  Medication Sig Dispense Refill   ACCU-CHEK AVIVA PLUS test strip CHECK BLOOD SUGAR TWICE DAILY AND AS DIRECTED FROM DM 200 strip 1   acetaminophen (TYLENOL) 650 MG CR tablet Take 650 mg by mouth every 8 (eight) hours as needed.     aspirin 81 MG tablet Take 81 mg by mouth daily.     ferrous sulfate (FEROSUL) 325 (65 FE) MG tablet Take 1 tablet (325 mg total) by mouth daily. With food 90 tablet 3   glimepiride (AMARYL) 4 MG tablet TAKE ONE TABLET BY MOUTH TWICE A DAY 180 tablet 1   insulin glargine (LANTUS SOLOSTAR) 100 UNIT/ML Solostar Pen Inject 15 Units into the skin daily. 15 mL 3   levothyroxine  (SYNTHROID) 75 MCG tablet Take 1 tablet (75 mcg total) by mouth daily. 90 tablet 3   Multiple Vitamin (MULTIVITAMIN) capsule Take 1 capsule by mouth daily.     NON FORMULARY 100 BC ultra fine needles     ondansetron (ZOFRAN ODT) 4 MG disintegrating tablet Take 1 tablet (4 mg total) by mouth every 8 (eight) hours as needed for nausea or vomiting. 20 tablet 0   pantoprazole (PROTONIX) 40 MG tablet TAKE ONE TABLET BY MOUTH ONCE A DAY 90 tablet 1   rosuvastatin (CRESTOR) 5 MG tablet Take 1 tablet (5 mg total) by mouth every other day. 45 tablet 3   senna (SENOKOT) 8.6 MG tablet Take 1 tablet by mouth 2 (two) times daily.     sitaGLIPtin (JANUVIA) 50 MG tablet Take 1 tablet (50 mg total) by mouth daily. 90 tablet 3   tamsulosin (FLOMAX) 0.4 MG CAPS capsule Take 1 capsule (0.4 mg total) by mouth daily. 90 capsule 3   ULTICARE MICRO PEN NEEDLES 32G X 4 MM MISC USE AS DIRECTED WITH LANTUS 100 each 1   [DISCONTINUED] omeprazole (PRILOSEC OTC) 20 MG tablet Take 1 tablet (20 mg total) by mouth daily. 90 tablet 1   No current facility-administered medications on file prior to visit.     Review of Systems  Constitutional:  Positive for fatigue. Negative for activity change, appetite change, fever and unexpected weight change.  HENT:  Negative for congestion, rhinorrhea, sore throat and trouble swallowing.   Eyes:  Negative for pain, redness, itching and visual disturbance.  Respiratory:  Negative for cough, chest tightness, shortness of breath and wheezing.  Cardiovascular:  Negative for chest pain and palpitations.  Gastrointestinal:  Negative for abdominal pain, blood in stool, constipation, diarrhea and nausea.  Endocrine: Negative for cold intolerance, heat intolerance, polydipsia and polyuria.  Genitourinary:  Negative for difficulty urinating, dysuria, frequency and urgency.  Musculoskeletal:  Negative for arthralgias, joint swelling and myalgias.  Skin:  Negative for pallor and rash.       Sore  spot on arch of foot - ? wart  Neurological:  Negative for dizziness, tremors, weakness, numbness and headaches.  Hematological:  Negative for adenopathy. Does not bruise/bleed easily.  Psychiatric/Behavioral:  Negative for decreased concentration and dysphoric mood. The patient is not nervous/anxious.        Objective:   Physical Exam Constitutional:      General: He is not in acute distress.    Appearance: Normal appearance. He is well-developed and normal weight. He is not ill-appearing or diaphoretic.  HENT:     Head: Normocephalic and atraumatic.     Right Ear: Tympanic membrane, ear canal and external ear normal.     Left Ear: Tympanic membrane, ear canal and external ear normal.     Nose: Nose normal. No congestion.     Mouth/Throat:     Mouth: Mucous membranes are moist.     Pharynx: Oropharynx is clear. No posterior oropharyngeal erythema.  Eyes:     General: No scleral icterus.       Right eye: No discharge.        Left eye: No discharge.     Conjunctiva/sclera: Conjunctivae normal.     Pupils: Pupils are equal, round, and reactive to light.  Neck:     Thyroid: No thyromegaly.     Vascular: No carotid bruit or JVD.  Cardiovascular:     Rate and Rhythm: Normal rate and regular rhythm.     Pulses: Normal pulses.     Heart sounds: Normal heart sounds.     No gallop.  Pulmonary:     Effort: Pulmonary effort is normal. No respiratory distress.     Breath sounds: Normal breath sounds. No wheezing or rales.     Comments: Good air exch Chest:     Chest wall: No tenderness.  Abdominal:     General: Bowel sounds are normal. There is no distension or abdominal bruit.     Palpations: Abdomen is soft. There is no mass.     Tenderness: There is no abdominal tenderness.     Hernia: No hernia is present.  Musculoskeletal:        General: No tenderness.     Cervical back: Normal range of motion and neck supple. No rigidity. No muscular tenderness.     Right lower leg: No  edema.     Left lower leg: No edema.  Lymphadenopathy:     Cervical: No cervical adenopathy.  Skin:    General: Skin is warm and dry.     Coloration: Skin is not pale.     Findings: No erythema or rash.     Comments: Thick nails  Tiny wart on sole of L foot in arch  Solar lentigines diffusely Some scattered SKs  AK on R dorsal hand  Neurological:     Mental Status: He is alert.     Cranial Nerves: No cranial nerve deficit.     Motor: No abnormal muscle tone.     Coordination: Coordination normal.     Gait: Gait normal.     Deep Tendon Reflexes: Reflexes are  normal and symmetric. Reflexes normal.  Psychiatric:        Mood and Affect: Mood normal.        Cognition and Memory: Cognition normal.           Assessment & Plan:   Problem List Items Addressed This Visit       Cardiovascular and Mediastinum   Essential hypertension    bp in fair control at this time  BP Readings from Last 1 Encounters:  03/16/23 128/64  No changes needed Most recent labs reviewed  Disc lifstyle change with low sodium diet and exercise  Under care of nephrology for CKD Amlodipine 5 mg daily was too much- he cut to 2.5 mg daily now  No hypotension       Relevant Medications   amLODipine (NORVASC) 5 MG tablet     Digestive   GERD    Protonix is working well  Will monitor B12 in future         Endocrine   Controlled type 2 diabetes mellitus with chronic kidney disease ([place])    Lab Results  Component Value Date   HGBA1C 7.8 (H) 03/09/2023  Struggling to keep am glucose levels from being low Enc bedtime snack with protein  Watch sugar intake  Some issues with needles for his basal insulin/ins cov  Takes lantus 15 u daily  Amaryl 4 mg bid  Januvia 50 mg daily  Gets microalb from nephrology  Good foot care  On a statin   Plan to ref to pharmacy for help with blood sugars  F/u 3 mo       Relevant Orders   AMB Referral to [place] ([facility] Patients)      Musculoskeletal and Integument   Plantar wart, left foot    Small one in arch of L foot Adv cautious use of compound W otc and report back         Genitourinary   Chronic kidney disease (CKD)    Continues nephrology care         Other   Anemia    anemia of chronic disease In the setting of CKD Hemoglobin is stable at 12.3 Patient takes ferrous sulfate every other day      Routine general medical examination at a health care facility - Primary    Reviewed health habits including diet and exercise and skin cancer prevention Reviewed appropriate screening tests for age  Also reviewed health mt list, fam hx and immunization status , as well as social and family history   See HPI Labs reviewed  Eye exam utd  PHQ score of 0 No clinical prostate changes Enc exercise as tolerated Disc fall prevention and bone health

## 2023-03-16 NOTE — Assessment & Plan Note (Signed)
Small one in arch of L foot Adv cautious use of compound W otc and report back

## 2023-03-16 NOTE — Assessment & Plan Note (Signed)
bp in fair control at this time  BP Readings from Last 1 Encounters:  03/16/23 128/64   No changes needed Most recent labs reviewed  Disc lifstyle change with low sodium diet and exercise  Under care of nephrology for CKD Amlodipine 5 mg daily was too much- he cut to 2.5 mg daily now  No hypotension

## 2023-03-16 NOTE — Assessment & Plan Note (Signed)
Continues nephrology care  

## 2023-03-16 NOTE — Patient Instructions (Addendum)
I think a referral to our pharmacist is a good idea   I put the referral in  Please let us know if you don't hear in 1-2 weeks   I will read the forms from [place]   For night time snack - eat peanut butter or cheese  You need protein to help keep the am blood sugar from going too low   Take care of yourself  Work on the wart on your left foot with compound W or other over the counter product   Try to eat a healthy diet   Follow up here in 3 months for diabetes

## 2023-03-16 NOTE — Assessment & Plan Note (Signed)
Protonix is working well  Will monitor B12 in future

## 2023-03-16 NOTE — Assessment & Plan Note (Signed)
Reviewed health habits including diet and exercise and skin cancer prevention Reviewed appropriate screening tests for age  Also reviewed health mt list, fam hx and immunization status , as well as social and family history   See HPI Labs reviewed  Eye exam utd  PHQ score of 0 No clinical prostate changes Enc exercise as tolerated Disc fall prevention and bone health

## 2023-03-18 ENCOUNTER — Telehealth: Payer: Self-pay

## 2023-03-18 NOTE — Progress Notes (Signed)
Care Management & Coordination Services Pharmacy Team  Reason for Encounter: Initial Questions   Contacted patient to confirm in office appointment with David Henry  PharmD on 03/23/23 at 10:00. Spoke with family on 03/18/2023   Talked with wife David Henry- reminded the patient that appointment was at office at [facility]  Do you have any problems getting your medications? No  What is your top health concern you would like to discuss at your upcoming visit?  Patient having difficulty with am BG's  Have you seen any other providers since your last visit with PCP? No   Chart review:  Recent office visits:  03/16/23-David Tower,MD(PCP)-AWV, discussed screenings,vaccines,reviewed labs, referral to pharmacy for help with BG's f/u 3 months.  Recent consult visits:  01/29/23-David Drane,PA(cardio)- f/u CAD,Recommend Mediterranean Diet,f/u 6 months  11/17/22-Singh,Harmeet,MD(neph)-reduce amlodipine to 2.5mg  daily,f/u 6 months  Hospital visits:  None in previous 6 months   Star Rating Drugs:  Medication:  Last Fill: Day Supply Glimepiride 4mg  03/03/23 90 Lantus   01/22/23  100 Rosuvastatin 5mg  01/31/23 90 Januvia 50mg   01/13/23 90   Care Gaps: Annual wellness visit in last year? Yes  If Diabetic: Last eye exam / retinopathy screening:UTD Last diabetic foot exam: UTD   David Henry, PharmD notified  Avel Sensor, [place] Assistant [phone]

## 2023-03-23 ENCOUNTER — Ambulatory Visit: Payer: Medicare PPO | Admitting: Pharmacist

## 2023-03-23 DIAGNOSIS — E119 Type 2 diabetes mellitus without complications: Secondary | ICD-10-CM

## 2023-03-23 MED ORDER — BD PEN NEEDLE NANO U/F 32G X 4 MM MISC: 3 refills | Status: DC

## 2023-03-23 MED ORDER — FREESTYLE LIBRE 3 SENSOR MISC: 3 refills | Status: AC

## 2023-03-23 MED ORDER — FREESTYLE LIBRE 3 READER DEVI: 1.0000 | 0 refills | Status: AC

## 2023-03-23 NOTE — Patient Instructions (Signed)
Visit Information  Phone number for Pharmacist: [phone]  Thank you for meeting with me to discuss your medications! I look forward to working with you to achieve your health care goals. Below is a summary of what we talked about during the visit:  Stop taking glimepiride for now.  I've sent [employer] Reader (1 device) and sensors (2) to your pharmacy. Please pick them up and bring everything back to your appt on Wednesday.   Charlene Brooke, PharmD, BCACP Clinical Pharmacist [facility] [phone]
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
## 2023-03-24 ENCOUNTER — Telehealth: Payer: Self-pay | Admitting: Family Medicine

## 2023-03-24 NOTE — Telephone Encounter (Signed)
Called patient to schedule Medicare Annual Wellness Visit (AWV). Left message for patient to call back and schedule Medicare Annual Wellness Visit (AWV).  Last date of AWV: 03/13/2023  Please schedule an appointment at any time with [facility] .  If any questions, please contact me at [phone].  Thank you ,  [place] Direct Dial: [phone]

## 2023-03-25 ENCOUNTER — Ambulatory Visit: Payer: Medicare PPO | Admitting: Pharmacist

## 2023-03-25 NOTE — Progress Notes (Signed)
Patient presented for [facility] 3 training.  Demonstrated with teach-back: -components of sensor and how to prepare sensor for application -how to apply sensor to arm. Patient successfully applied sensor to L arm today -how to navigate Reader device in order to pair sensor; sensor was successfully paired today -how to adjust high/low glucose alarms. Low alarm set to 70 and high alarm set to 300 today.  Educated patient on: -viewing reader at any time to view real-time glucose readings (new reading every 1 minute) -changing sensor every 14 days or as initiated by app/reader. Each new sensor has 60-minute warmup period. -avoiding high doses of Vitamin C > 500 mg/day -removing sensor prior to MRI, CT scans -difference between blood glucose and "sensor" glucose -if glucose readings ever seem wrong/questionable, check glucose with a finger stick -Libreview cloud monitoring: Patient did not enroll in [place]  Provided customer service line for [employer] in case of sensor breakdowns: [phone]   Pt voiced understanding of above and denies further questions.  Plan: Return to office in 2 weeks for [employer] and discussion of report.   Charlene Brooke, PharmD, Para [place], CPP Clinical Pharmacist Practitioner [place] at [facility] [phone]

## 2023-03-25 NOTE — Patient Instructions (Signed)
Visit Information  Phone number for Pharmacist: [phone]  Thank you for meeting with me to discuss your medications! Below is a summary of what we talked about during the visit:   Today we placed the Freestyle Libre 3 continuous glucose monitor (CGM) on your arm. It will track your sugar for you with new readings every 1-5 minutes.  Important points to remember about your CGM: -Any time the sugar reading does not reflect how you feel (particularly if it is telling you a very low reading), double check with a finger stick -The sensor is measuring the sugar in your skin, and a finger stick measures the sugar in your blood, and these numbers will probably never be exactly the same. This is normal. -The system will prompt you to change the sensor after approximately 14 days. Just peel of the current sensor, place the new sensor in a different spot, and pair it with your meter by clicking "start new sensor" and scanning the sensor. -The sensor has to be removed prior to CT scans or MRIs. Try not to apply a new sensor if you know you have a scan coming up. -If the sensor falls off early, it cannot be re-applied. A new sensor should be applied.   With any sensor malfunctions, call Freestyle: [phone] For sensor refills, contact your pharmacy.   Charlene Brooke, PharmD, BCACP Clinical Pharmacist [facility] at [facility] [phone]

## 2023-04-03 ENCOUNTER — Telehealth: Payer: Self-pay

## 2023-04-03 NOTE — Progress Notes (Signed)
Care Management & Coordination Services Pharmacy Team  Reason for Encounter: Appointment Reminder-CGM  Contacted patient to confirm in office appointment with Al Corpus , PharmD on 04/08/23 at 10:00. Spoke with patient on 04/03/2023   Do you have any problems getting your medications? No   What is your top health concern you would like to discuss at your upcoming visit? BG's trending high concerns the patient.Patient wants medications addressed for the DM  Have you seen any other providers since your last visit with PCP? No   Hospital visits:  None in previous 6 months   Star Rating Drugs:  Medication:  Last Fill: Day Supply Glimepiride 4mg  03/03/23 90 Lantus   01/22/23  100 Rosuvastatin 5mg  01/31/23 90 Januvia 50mg   01/13/23 90    Care Gaps: Annual wellness visit in last year? Yes  If Diabetic: Last David exam / retinopathy screening:UTD Last diabetic foot exam:UTD   Al Corpus, PharmD notified  David Henry, David Henry Clinical Pharmacy Assistant [phone]

## 2023-04-08 ENCOUNTER — Ambulatory Visit: Payer: Medicare PPO | Admitting: Pharmacist
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
## 2023-04-09 NOTE — Patient Instructions (Signed)
Visit Information  Phone number for Pharmacist: [phone]  Thank you for meeting with me to discuss your medications! Below is a summary of what we talked about during the visit:   Recommendations/Changes made from today's visit: -Reasonable to try glimepiride 4 mg once daily in AM; contact provider with glucose < 70  Follow up plan: -Pharmacist follow up televisit scheduled for 2 weeks -PCP appt 06/16/23   Al Corpus, PharmD, BCACP Clinical Pharmacist [facility] at [facility] [phone]

## 2023-04-10 ENCOUNTER — Other Ambulatory Visit: Payer: Self-pay | Admitting: Family Medicine

## 2023-04-11 ENCOUNTER — Other Ambulatory Visit: Payer: Self-pay | Admitting: Family Medicine

## 2023-04-21 ENCOUNTER — Telehealth: Payer: Self-pay

## 2023-04-21 NOTE — Progress Notes (Signed)
Care Management & Coordination Services Pharmacy Team  Reason for Encounter: Appointment Reminder  Contacted patient to confirm telephone appointment with Al Corpus , PharmD on 04/24/23 at 1:30. Spoke with patient on 04/21/2023   Do you have any problems getting your medications? No  What is your top health concern you would like to discuss at your upcoming visit? Patient had to change Freestyle Libre this week and wants to make sure he did it correctly  Have you seen any other providers since your last visit with PCP? No   Hospital visits:  None in previous 6 months   Star Rating Drugs:  Medication:  Last Fill: Day Supply Glimepiride 4mg  03/03/23 90  Lantus   01/22/23  100    Januvia 50  04/13/23 90 Rosuvastatin 5mg  01/31/23 90   Care Gaps: Annual wellness visit in last year? Yes   If Diabetic: Last eye exam / retinopathy screening:UTD Last diabetic foot exam:UTD   Al Corpus, PharmD notified  Burt Knack, [facility] Clinical Pharmacy Assistant [phone]

## 2023-04-24 ENCOUNTER — Ambulatory Visit: Payer: Medicare PPO | Admitting: Pharmacist

## 2023-04-24 MED ORDER — GLIMEPIRIDE 4 MG PO TABS: ORAL_TABLET | ORAL | 1 refills | Status: DC
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
## 2023-05-06 ENCOUNTER — Other Ambulatory Visit: Payer: Self-pay | Admitting: Family Medicine

## 2023-05-11 DIAGNOSIS — I10 Essential (primary) hypertension: Secondary | ICD-10-CM | POA: Diagnosis not present

## 2023-05-11 DIAGNOSIS — E1129 Type 2 diabetes mellitus with other diabetic kidney complication: Secondary | ICD-10-CM | POA: Diagnosis not present

## 2023-05-11 DIAGNOSIS — N2581 Secondary hyperparathyroidism of renal origin: Secondary | ICD-10-CM | POA: Diagnosis not present

## 2023-05-11 DIAGNOSIS — N1832 Chronic kidney disease, stage 3b: Secondary | ICD-10-CM | POA: Diagnosis not present

## 2023-05-15 ENCOUNTER — Telehealth: Payer: Self-pay

## 2023-05-15 NOTE — Progress Notes (Signed)
Care Management & Coordination Services Pharmacy Team  Reason for Encounter: Appointment Reminder  Contacted patient to confirm telephone appointment with Al Corpus , PharmD on 05/20/23 at 10:15. Unsuccessful outreach. Left voicemail for patient to return call.  Have you seen any other providers since your last visit with PCP? No   Hospital visits:  None in previous 6 months   Star Rating Drugs:  Medication:  Last Fill: Day Supply Glimepiride 4mg  03/03/23 90 Lantus   05/01/23 100 Januvia 50mg   04/13/23 90 Rosuvastatin 5mg  05/06/23 90   Care Gaps: Annual wellness visit in last year? Yes  If Diabetic: Last eye exam / retinopathy screening:UTD Last diabetic foot exam:UTD   Al Corpus, PharmD notified  Burt Knack, [facility] Clinical Pharmacy Assistant [phone]

## 2023-05-19 DIAGNOSIS — I10 Essential (primary) hypertension: Secondary | ICD-10-CM | POA: Diagnosis not present

## 2023-05-19 DIAGNOSIS — E1129 Type 2 diabetes mellitus with other diabetic kidney complication: Secondary | ICD-10-CM | POA: Diagnosis not present

## 2023-05-19 DIAGNOSIS — N2581 Secondary hyperparathyroidism of renal origin: Secondary | ICD-10-CM | POA: Diagnosis not present

## 2023-05-19 DIAGNOSIS — N1832 Chronic kidney disease, stage 3b: Secondary | ICD-10-CM | POA: Diagnosis not present

## 2023-05-20 ENCOUNTER — Ambulatory Visit: Payer: Medicare PPO | Admitting: Pharmacist

## 2023-05-20 MED ORDER — GLIMEPIRIDE 4 MG PO TABS: 2.0000 mg | ORAL_TABLET | Freq: Every day | ORAL | 1 refills | Status: DC
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
## 2023-06-16 ENCOUNTER — Ambulatory Visit: Payer: Medicare PPO | Admitting: Family Medicine

## 2023-06-16 ENCOUNTER — Encounter: Payer: Self-pay | Admitting: Family Medicine

## 2023-06-16 VITALS — BP 122/60 | HR 57 | Temp 97.7°F | Ht 66.5 in | Wt 147.0 lb

## 2023-06-16 DIAGNOSIS — E785 Hyperlipidemia, unspecified: Secondary | ICD-10-CM

## 2023-06-16 DIAGNOSIS — N183 Chronic kidney disease, stage 3 unspecified: Secondary | ICD-10-CM

## 2023-06-16 DIAGNOSIS — E1122 Type 2 diabetes mellitus with diabetic chronic kidney disease: Secondary | ICD-10-CM

## 2023-06-16 DIAGNOSIS — I10 Essential (primary) hypertension: Secondary | ICD-10-CM

## 2023-06-16 DIAGNOSIS — Z794 Long term (current) use of insulin: Secondary | ICD-10-CM

## 2023-06-16 DIAGNOSIS — E1169 Type 2 diabetes mellitus with other specified complication: Secondary | ICD-10-CM

## 2023-06-16 DIAGNOSIS — N1832 Chronic kidney disease, stage 3b: Secondary | ICD-10-CM

## 2023-06-16 LAB — POCT GLYCOSYLATED HEMOGLOBIN (HGB A1C): Hemoglobin A1C: 7.5 % — AB (ref 4.0–5.6)

## 2023-06-16 NOTE — Assessment & Plan Note (Signed)
Despite having CGM and seeing pharmacist pt is still really struggling with am hypoglycemia and high mid day readings (the high readings really distress him) Lab Results  Component Value Date   HGBA1C 7.5 (A) 06/16/2023   This is down from 7.8 Continues  Lantus 15 u in am Amaryl 2 mg in am (he did not tolerate holding this) Januvia 50 mg in am  Stressed improvement of Protien for bedtime snack instead of sugar  Small meals with protein through the day   Will ref to endocrinology for help  Also CKD/ is up to date with his nephrology care  Good foot care Taking a statin  Utd dm eye exam  Follow up 6 mo or earlier if needed

## 2023-06-16 NOTE — Patient Instructions (Addendum)
I want you to get a good portion of protein at bedtime to help decrease the low sugar in the night/am  Peanut butter with fruit is great option  Other dairy products besides ice cream  Eggs , meat, nuts, soy products and dried beans are good also   Save outdoor work for early am or evening  Do not get out in the heat   I put the referral in  Please let us know if you don't hear in 1-2 weeks    Please let us know if you continue to have low glucose in the am

## 2023-06-16 NOTE — Assessment & Plan Note (Signed)
Stable Reviewed recent nephrology notes Encouraged to keep up good hydratoin

## 2023-06-16 NOTE — Assessment & Plan Note (Signed)
bp in fair control at this time  BP Readings from Last 1 Encounters:  06/16/23 122/60   No changes needed Most recent labs reviewed  Disc lifstyle change with low sodium diet and exercise  Under care of nephrology for CKD Amlodipine 5 mg daily was too much- he cut to 2.5 mg daily now  No hypotension  Torsemide was reduced to 10 mg three days weekly  Utd nephrology care

## 2023-06-16 NOTE — Progress Notes (Signed)
Subjective:    Patient ID: David Henry, male    DOB: 20-Nov-1926, 89 y.o.   MRN: [id number]  HPI  Wt Readings from Last 3 Encounters:  06/16/23 147 lb (66.7 kg)  03/16/23 155 lb (70.3 kg)  06/13/22 155 lb 12.8 oz (70.7 kg)   23.37 kg/m  Vitals:   06/16/23 1031  BP: 122/60  Pulse: (!) 57  Temp: 97.7 F (36.5 C)  SpO2: 98%    Pt presents for follow up of DM2 and chronic medical problems   HTN bp is stable today  No cp or palpitations or headaches or edema  No side effects to medicines  BP Readings from Last 3 Encounters:  06/16/23 122/60  03/16/23 128/64  06/13/22 134/70    Sees nephrology for CKD Amloodipine 2.5 mg daily  Torsemide reduced to 10 mg three days weekly   Stays tired Back and knee problems  More motility issues   CKD Last nephrology note Diabetic CKD 3b -chronic kidney disease risk factors include atrophic right kidney, history of hydronephrosis and left ureteral stent, atherosclerosis, diabetes and hypertension Most recent labs-05/11/2023-creatinine 1.87, GFR 33. Baseline. Continued on aspirin, rosuvastatin for cardiovascular risk reduction. Avoid nonsteroidals, IV contrast  Not on ACE inhibitor or ARB. Blood previously was low normal.    Lab Results  Component Value Date   NA 142 03/09/2023   K 4.2 03/09/2023   CO2 27 03/09/2023   GLUCOSE 98 03/09/2023   BUN 33 (H) 03/09/2023   CREATININE 1.88 (H) 03/09/2023   CALCIUM 9.6 03/09/2023   GFR 30.17 (L) 03/09/2023   GFRNONAA 30 (L) 03/28/2021     DM2 Lab Results  Component Value Date   HGBA1C 7.5 (A) 06/16/2023   Has CKD- sees nephrology   A1c is 7.5 today   Has been eating fair  Glucose is labile   Bedtime snack - sometimes ice cream or fig newtons or fruit bar   Fairly active Works outside   Had episode of near syncope and vomiting after working outside  Glucose was 45  Wife gave him some jelly and ice cream  In retrospect - was heat exhaustion   Tries to drink a  lot of fluids    Lantus 15 u daily (in the am)  Amaryl 2 mg daily in am  Januvia 50 mg daily   He is still getting low blood sugars in the am   Is up to 200 to 300 in the afternoon    Gets microalb from nephrology   Good foot care Takes a statin - crestor 5 mg every other day   Lab Results  Component Value Date   CHOL 122 03/09/2023   HDL 45.80 03/09/2023   LDLCALC 59 03/09/2023   TRIG 88.0 03/09/2023   CHOLHDL 3 03/09/2023       Patient Active Problem List   Diagnosis Date Noted   Plantar wart, left foot 03/16/2023   Renal stone 03/22/2021   Macrocytosis 10/05/2019   Routine general medical examination at a health care facility 03/09/2016   Constipation 09/17/2015   Encounter for Medicare annual wellness exam 12/08/2014   Chronic kidney disease (CKD) 05/24/2013   Anemia 11/23/2012   Hypothyroid 11/23/2012   GERD 11/29/2010   Coronary atherosclerosis 04/18/2007   Controlled type 2 diabetes mellitus with chronic kidney disease (HCC) 04/15/2007   Hyperlipidemia associated with type 2 diabetes mellitus (HCC) 04/15/2007   Essential hypertension 04/15/2007   MYOCARDIAL INFARCTION, HX OF 04/15/2007   ACTINIC KERATOSIS  04/15/2007   Past Medical History:  Diagnosis Date   Arthritis    Basal cell carcinoma 08/15/2021   right cheek, EDC   CAD (coronary artery disease)    3 stents   Colon polyp    Diabetes mellitus    type II   Dyspnea    with exertion   GERD (gastroesophageal reflux disease)    Hyperlipidemia    Hypertension    Hypothyroidism    Kidney stones    stage III CKD   Neuropathy    Peripheral vascular disease (HCC)    neuropathy in feet   Past Surgical History:  Procedure Laterality Date   APPENDECTOMY  1984   CATARACT EXTRACTION Bilateral 04/2001   CHOLECYSTECTOMY  1989   COLONOSCOPY N/A 2/14   hemorrhoids and tics (after pos ifob card)   CORONARY ANGIOPLASTY  2006   3 stents   CYSTOSCOPY/URETEROSCOPY/HOLMIUM LASER/STENT PLACEMENT Left  03/24/2021   Procedure: CYSTOSCOPY/URETEROSCOPY, RETROGRADE AND /STENT PLACEMENT;  Surgeon: Riki Altes, MD;  Location: [facility];  Service: Urology;  Laterality: Left;   ESOPHAGOGASTRODUODENOSCOPY N/A 2/14   RETINAL DETACHMENT SURGERY Right 2003   SHOULDER ARTHROSCOPY WITH ROTATOR CUFF REPAIR AND SUBACROMIAL DECOMPRESSION Right 12/30/2017   Procedure: SHOULDER ARTHROSCOPY WITH ROTATOR CUFF REPAIR AND SUBACROMIAL DECOMPRESSION;  Surgeon: Erin Sons, MD;  Location: [facility];  Service: Orthopedics;  Laterality: Right;   SHOULDER SURGERY  2012   Lt shoulder repair   Social History   Tobacco Use   Smoking status: Never   Smokeless tobacco: Never  Vaping Use   Vaping Use: Never used  Substance Use Topics   Alcohol use: No    Alcohol/week: 0.0 standard drinks of alcohol   Drug use: No   Family History  Problem Relation Age of Onset   Cancer Brother        prostate CA   Heart disease Mother    Stroke Father    Kidney disease Sister    Kidney disease Sister    Clotting disorder Sister    COPD Brother    Heart disease Brother    Allergies  Allergen Reactions   Adhesive [Tape] Dermatitis    Skin is thin so it is tough to take off.  Use paper tape   Azithromycin Nausea Only    REACTION: nausea   Celecoxib Other (See Comments)    Raises blood pressure   Zocor [Simvastatin] Other (See Comments)    Muscle pain   Current Outpatient Medications on File Prior to Visit  Medication Sig Dispense Refill   ACCU-CHEK AVIVA PLUS test strip CHECK BLOOD SUGAR TWICE DAILY AND AS DIRECTED FROM DM 200 strip 1   acetaminophen (TYLENOL) 650 MG CR tablet Take 650 mg by mouth every 8 (eight) hours as needed.     amLODipine (NORVASC) 5 MG tablet Take 0.5 tablets (2.5 mg total) by mouth daily. 1 tablet 0   aspirin 81 MG tablet Take 81 mg by mouth daily.     Continuous Blood Gluc Receiver (FREESTYLE LIBRE 3 READER) DEVI 1 each by Does not apply route as directed. Use with sensors to monitor  glucose continuously 1 each 0   Continuous Blood Gluc Sensor (FREESTYLE LIBRE 3 SENSOR) MISC Place 1 sensor on the skin every 14 days. Use to check glucose continuously 6 each 3   ferrous sulfate (FEROSUL) 325 (65 FE) MG tablet TAKE ONE TABLET BY MOUTH ONCE A DAY WITH FOOD. 90 tablet 2   glimepiride (AMARYL) 4 MG tablet Take  0.5 tablets (2 mg total) by mouth daily with breakfast. 180 tablet 1   insulin glargine (LANTUS SOLOSTAR) 100 UNIT/ML Solostar Pen Inject 15 Units into the skin daily. 15 mL 3   Insulin Pen Needle (BD PEN NEEDLE NANO U/F) 32G X 4 MM MISC Use with insulin pen to inject insulin daily 100 each 3   JANUVIA 50 MG tablet TAKE ONE TABLET BY MOUTH ONCE A DAY 90 tablet 3   levothyroxine (SYNTHROID) 75 MCG tablet Take 1 tablet (75 mcg total) by mouth daily before breakfast. 90 tablet 2   NON FORMULARY 100 BC ultra fine needles     pantoprazole (PROTONIX) 40 MG tablet TAKE ONE TABLET BY MOUTH ONCE A DAY 90 tablet 1   rosuvastatin (CRESTOR) 5 MG tablet TAKE ONE TABLET BY MOUTH EVERY OTHER DAY 45 tablet 3   senna (SENOKOT) 8.6 MG tablet Take 1 tablet by mouth 2 (two) times daily.     tamsulosin (FLOMAX) 0.4 MG CAPS capsule TAKE ONE CAPSULE BY MOUTH ONCE DAILY 90 capsule 3   torsemide (DEMADEX) 10 MG tablet Take 10 mg by mouth 3 (three) times a week.     VITAMIN D PO Take 2,000 Units by mouth daily.     ondansetron (ZOFRAN ODT) 4 MG disintegrating tablet Take 1 tablet (4 mg total) by mouth every 8 (eight) hours as needed for nausea or vomiting. (Patient not taking: Reported on 06/16/2023) 20 tablet 0   [DISCONTINUED] omeprazole (PRILOSEC OTC) 20 MG tablet Take 1 tablet (20 mg total) by mouth daily. 90 tablet 1   No current facility-administered medications on file prior to visit.    Review of Systems  Constitutional:  Positive for fatigue. Negative for activity change, appetite change, fever and unexpected weight change.  HENT:  Negative for congestion, rhinorrhea, sore throat and  trouble swallowing.   Eyes:  Negative for pain, redness, itching and visual disturbance.  Respiratory:  Negative for cough, chest tightness, shortness of breath and wheezing.   Cardiovascular:  Negative for chest pain and palpitations.  Gastrointestinal:  Negative for abdominal pain, blood in stool, constipation, diarrhea and nausea.  Endocrine: Negative for cold intolerance, heat intolerance, polydipsia and polyuria.  Genitourinary:  Negative for difficulty urinating, dysuria, frequency and urgency.  Musculoskeletal:  Positive for arthralgias. Negative for joint swelling and myalgias.  Skin:  Negative for pallor and rash.  Neurological:  Negative for dizziness, tremors, weakness, numbness and headaches.  Hematological:  Negative for adenopathy. Does not bruise/bleed easily.  Psychiatric/Behavioral:  Negative for decreased concentration and dysphoric mood. The patient is not nervous/anxious.        Objective:   Physical Exam Constitutional:      General: He is not in acute distress.    Appearance: Normal appearance. He is well-developed and normal weight. He is not ill-appearing or diaphoretic.  HENT:     Head: Normocephalic and atraumatic.  Eyes:     Conjunctiva/sclera: Conjunctivae normal.     Pupils: Pupils are equal, round, and reactive to light.  Neck:     Thyroid: No thyromegaly.     Vascular: No carotid bruit or JVD.  Cardiovascular:     Rate and Rhythm: Normal rate and regular rhythm.     Heart sounds: Normal heart sounds.     No gallop.  Pulmonary:     Effort: Pulmonary effort is normal. No respiratory distress.     Breath sounds: Normal breath sounds. No wheezing or rales.  Abdominal:     General:  There is no distension or abdominal bruit.     Palpations: Abdomen is soft.  Musculoskeletal:     Cervical back: Normal range of motion and neck supple.     Right lower leg: No edema.     Left lower leg: No edema.  Lymphadenopathy:     Cervical: No cervical adenopathy.   Skin:    General: Skin is warm and dry.     Coloration: Skin is not pale.     Findings: No rash.  Neurological:     Mental Status: He is alert.     Coordination: Coordination normal.     Deep Tendon Reflexes: Reflexes are normal and symmetric. Reflexes normal.  Psychiatric:        Mood and Affect: Mood normal.           Assessment & Plan:   Problem List Items Addressed This Visit       Cardiovascular and Mediastinum   Essential hypertension    bp in fair control at this time  BP Readings from Last 1 Encounters:  06/16/23 122/60  No changes needed Most recent labs reviewed  Disc lifstyle change with low sodium diet and exercise  Under care of nephrology for CKD Amlodipine 5 mg daily was too much- he cut to 2.5 mg daily now  No hypotension  Torsemide was reduced to 10 mg three days weekly  Utd nephrology care           Endocrine   Hyperlipidemia associated with type 2 diabetes mellitus (HCC)    Disc goals for lipids and reasons to control them Rev last labs with pt Rev low sat fat diet in detail Plan to continue rosuvastatin 5 mg every other day   LDL is controlled at 55      Controlled type 2 diabetes mellitus with chronic kidney disease (HCC) - Primary    Despite having CGM and seeing pharmacist pt is still really struggling with am hypoglycemia and high mid day readings (the high readings really distress him) Lab Results  Component Value Date   HGBA1C 7.5 (A) 06/16/2023   This is down from 7.8 Continues  Lantus 15 u in am Amaryl 2 mg in am (he did not tolerate holding this) Januvia 50 mg in am  Stressed improvement of Protien for bedtime snack instead of sugar  Small meals with protein through the day   Will ref to endocrinology for help  Also CKD/ is up to date with his nephrology care  Good foot care Taking a statin  Utd dm eye exam  Follow up 6 mo or earlier if needed       Relevant Orders   POCT glycosylated hemoglobin (Hb A1C)  (Completed)   Ambulatory referral to Endocrinology     Genitourinary   Chronic kidney disease (CKD)    Stable Reviewed recent nephrology notes Encouraged to keep up good hydratoin

## 2023-06-16 NOTE — Assessment & Plan Note (Signed)
Disc goals for lipids and reasons to control them ?Rev last labs with pt ?Rev low sat fat diet in detail ?Plan to continue rosuvastatin 5 mg every other day   ?LDL is controlled at 55 ?

## 2023-07-07 DIAGNOSIS — H93293 Other abnormal auditory perceptions, bilateral: Secondary | ICD-10-CM | POA: Diagnosis not present

## 2023-07-09 ENCOUNTER — Encounter: Payer: Self-pay | Admitting: Pharmacist

## 2023-07-09 NOTE — Progress Notes (Signed)
Patient previously followed by [facility] pharmacist. Per clinical review, no pharmacist appointment needed at this time as patient was referred to endocrinology for DM management. Please refer to pharmacy for any future needs.

## 2023-07-13 DIAGNOSIS — E119 Type 2 diabetes mellitus without complications: Secondary | ICD-10-CM | POA: Diagnosis not present

## 2023-07-13 DIAGNOSIS — N1832 Chronic kidney disease, stage 3b: Secondary | ICD-10-CM | POA: Diagnosis not present

## 2023-07-13 DIAGNOSIS — E11649 Type 2 diabetes mellitus with hypoglycemia without coma: Secondary | ICD-10-CM | POA: Diagnosis not present

## 2023-07-13 DIAGNOSIS — E1159 Type 2 diabetes mellitus with other circulatory complications: Secondary | ICD-10-CM | POA: Diagnosis not present

## 2023-07-13 DIAGNOSIS — E039 Hypothyroidism, unspecified: Secondary | ICD-10-CM | POA: Diagnosis not present

## 2023-07-13 DIAGNOSIS — Z794 Long term (current) use of insulin: Secondary | ICD-10-CM | POA: Diagnosis not present

## 2023-07-13 DIAGNOSIS — E1122 Type 2 diabetes mellitus with diabetic chronic kidney disease: Secondary | ICD-10-CM | POA: Diagnosis not present

## 2023-07-15 ENCOUNTER — Encounter: Payer: Medicare PPO | Admitting: Pharmacist

## 2023-08-05 DIAGNOSIS — N1831 Chronic kidney disease, stage 3a: Secondary | ICD-10-CM | POA: Diagnosis not present

## 2023-08-05 DIAGNOSIS — I10 Essential (primary) hypertension: Secondary | ICD-10-CM | POA: Diagnosis not present

## 2023-08-05 DIAGNOSIS — E782 Mixed hyperlipidemia: Secondary | ICD-10-CM | POA: Diagnosis not present

## 2023-08-05 DIAGNOSIS — R001 Bradycardia, unspecified: Secondary | ICD-10-CM | POA: Diagnosis not present

## 2023-08-05 DIAGNOSIS — Z794 Long term (current) use of insulin: Secondary | ICD-10-CM | POA: Diagnosis not present

## 2023-08-05 DIAGNOSIS — I340 Nonrheumatic mitral (valve) insufficiency: Secondary | ICD-10-CM | POA: Diagnosis not present

## 2023-08-05 DIAGNOSIS — I2510 Atherosclerotic heart disease of native coronary artery without angina pectoris: Secondary | ICD-10-CM | POA: Diagnosis not present

## 2023-08-05 DIAGNOSIS — I77810 Thoracic aortic ectasia: Secondary | ICD-10-CM | POA: Diagnosis not present

## 2023-08-05 DIAGNOSIS — E119 Type 2 diabetes mellitus without complications: Secondary | ICD-10-CM | POA: Diagnosis not present

## 2023-08-06 DIAGNOSIS — E1122 Type 2 diabetes mellitus with diabetic chronic kidney disease: Secondary | ICD-10-CM | POA: Diagnosis not present

## 2023-08-06 DIAGNOSIS — N1832 Chronic kidney disease, stage 3b: Secondary | ICD-10-CM | POA: Diagnosis not present

## 2023-08-06 DIAGNOSIS — E11649 Type 2 diabetes mellitus with hypoglycemia without coma: Secondary | ICD-10-CM | POA: Diagnosis not present

## 2023-08-06 DIAGNOSIS — E119 Type 2 diabetes mellitus without complications: Secondary | ICD-10-CM | POA: Diagnosis not present

## 2023-08-06 DIAGNOSIS — E1159 Type 2 diabetes mellitus with other circulatory complications: Secondary | ICD-10-CM | POA: Diagnosis not present

## 2023-08-06 DIAGNOSIS — Z794 Long term (current) use of insulin: Secondary | ICD-10-CM | POA: Diagnosis not present

## 2023-08-06 DIAGNOSIS — E039 Hypothyroidism, unspecified: Secondary | ICD-10-CM | POA: Diagnosis not present

## 2023-08-21 ENCOUNTER — Ambulatory Visit: Payer: Medicare PPO

## 2023-08-21 DIAGNOSIS — Z0000 Encounter for general adult medical examination without abnormal findings: Secondary | ICD-10-CM | POA: Diagnosis not present

## 2023-08-21 NOTE — Progress Notes (Signed)
Subjective:   David Henry is a 89 y.o. male who presents for Medicare Annual/Subsequent preventive examination.  Visit Complete: Virtual  I connected with  David Henry on 08/21/23 by a audio enabled telemedicine application and verified that I am speaking with the correct person using two identifiers.  Patient Location: Home  Provider Location: Home Office  I discussed the limitations of evaluation and management by telemedicine. The patient expressed understanding and agreed to proceed.  Vital Signs: Unable to obtain new vitals due to this being a telehealth visit.  Review of Systems     Cardiac Risk Factors include: advanced age (>59men, >65 women);diabetes mellitus;dyslipidemia;hypertension;male gender     Objective:    Today's Vitals   There is no height or weight on file to calculate BMI.     08/21/2023    3:14 PM 03/22/2021    2:14 PM 06/03/2019    8:32 PM 09/24/2018   12:53 PM 07/29/2018   10:12 AM 12/29/2017   12:15 PM 03/19/2017    1:19 PM  Advanced Directives  Does Patient Have a Medical Advance Directive? Yes No;Yes Yes Yes Yes Yes Yes  Type of Estate agent of [place];Living will Healthcare Power of [employer] of [place];Living will Healthcare Power of [place];Living will Healthcare Power of [place];Living will Healthcare Power of [place];Living will Healthcare Power of [place];Living will  Does patient want to make changes to medical advance directive?   No - Patient declined  No - Patient declined    Copy of Healthcare Power of Attorney in Chart? No - copy requested  No - copy requested No - copy requested   No - copy requested  Would patient like information on creating a medical advance directive?      No - Patient declined     Current Medications (verified) Outpatient Encounter Medications as of 08/21/2023  Medication Sig   ACCU-CHEK AVIVA PLUS test strip CHECK BLOOD SUGAR TWICE DAILY AND AS DIRECTED FROM DM    acetaminophen (TYLENOL) 650 MG CR tablet Take 650 mg by mouth every 8 (eight) hours as needed.   amLODipine (NORVASC) 5 MG tablet Take 0.5 tablets (2.5 mg total) by mouth daily.   aspirin 81 MG tablet Take 81 mg by mouth daily.   Continuous Blood Gluc Receiver (FREESTYLE LIBRE 3 READER) DEVI 1 each by Does not apply route as directed. Use with sensors to monitor glucose continuously   Continuous Blood Gluc Sensor (FREESTYLE LIBRE 3 SENSOR) MISC Place 1 sensor on the skin every 14 days. Use to check glucose continuously   ferrous sulfate (FEROSUL) 325 (65 FE) MG tablet TAKE ONE TABLET BY MOUTH ONCE A DAY WITH FOOD.   glimepiride (AMARYL) 4 MG tablet Take 0.5 tablets (2 mg total) by mouth daily with breakfast.   levothyroxine (SYNTHROID) 75 MCG tablet Take 1 tablet (75 mcg total) by mouth daily before breakfast.   pantoprazole (PROTONIX) 40 MG tablet TAKE ONE TABLET BY MOUTH ONCE A DAY   rosuvastatin (CRESTOR) 5 MG tablet TAKE ONE TABLET BY MOUTH EVERY OTHER DAY   Semaglutide,0.25 or 0.5MG /DOS, 2 MG/3ML SOPN Inject into the skin.   tamsulosin (FLOMAX) 0.4 MG CAPS capsule TAKE ONE CAPSULE BY MOUTH ONCE DAILY   torsemide (DEMADEX) 10 MG tablet Take 10 mg by mouth daily.   VITAMIN D PO Take 2,000 Units by mouth daily.   insulin glargine (LANTUS SOLOSTAR) 100 UNIT/ML Solostar Pen Inject 15 Units into the skin daily. (Patient not taking: Reported  on 08/21/2023)   Insulin Pen Needle (BD PEN NEEDLE NANO U/F) 32G X 4 MM MISC Use with insulin pen to inject insulin daily (Patient not taking: Reported on 08/21/2023)   JANUVIA 50 MG tablet TAKE ONE TABLET BY MOUTH ONCE A DAY (Patient not taking: Reported on 08/21/2023)   NON FORMULARY 100 BC ultra fine needles (Patient not taking: Reported on 08/21/2023)   ondansetron (ZOFRAN ODT) 4 MG disintegrating tablet Take 1 tablet (4 mg total) by mouth every 8 (eight) hours as needed for nausea or vomiting. (Patient not taking: Reported on 06/16/2023)   senna (SENOKOT) 8.6 MG  tablet Take 1 tablet by mouth 2 (two) times daily. (Patient not taking: Reported on 08/21/2023)   [DISCONTINUED] omeprazole (PRILOSEC OTC) 20 MG tablet Take 1 tablet (20 mg total) by mouth daily.   No facility-administered encounter medications on file as of 08/21/2023.    Allergies (verified) Adhesive [tape], Azithromycin, Celecoxib, and Zocor [simvastatin]   History: Past Medical History:  Diagnosis Date   Arthritis    Basal cell carcinoma 08/15/2021   right cheek, EDC   CAD (coronary artery disease)    3 stents   Colon polyp    Diabetes mellitus    type II   Dyspnea    with exertion   GERD (gastroesophageal reflux disease)    Hyperlipidemia    Hypertension    Hypothyroidism    Kidney stones    stage III CKD   Neuropathy    Peripheral vascular disease (HCC)    neuropathy in feet   Past Surgical History:  Procedure Laterality Date   APPENDECTOMY  1984   CATARACT EXTRACTION Bilateral 04/2001   CHOLECYSTECTOMY  1989   COLONOSCOPY N/A 2/14   hemorrhoids and tics (after pos ifob card)   CORONARY ANGIOPLASTY  2006   3 stents   CYSTOSCOPY/URETEROSCOPY/HOLMIUM LASER/STENT PLACEMENT Left 03/24/2021   Procedure: CYSTOSCOPY/URETEROSCOPY, RETROGRADE AND /STENT PLACEMENT;  Surgeon: Riki Altes, MD;  Location: [facility] OR[facility];  Service: Urology;  Laterality: Left;   ESOPHAGOGASTRODUODENOSCOPY N/A 2/14   RETINAL DETACHMENT SURGERY Right 2003   SHOULDER ARTHROSCOPY WITH ROTATOR CUFF REPAIR AND SUBACROMIAL DECOMPRESSION Right 12/30/2017   Procedure: SHOULDER ARTHROSCOPY WITH ROTATOR CUFF REPAIR AND SUBACROMIAL DECOMPRESSION;  Surgeon: Erin Sons, MD;  Location: [facility];  Service: Orthopedics;  Laterality: Right;   SHOULDER SURGERY  2012   Lt shoulder repair   Family History  Problem Relation Age of Onset   Cancer Brother        prostate CA   Heart disease Mother    Stroke Father    Kidney disease Sister    Kidney disease Sister    Clotting disorder Sister    COPD Brother     Heart disease Brother    Social History   Socioeconomic History   Marital status: Married    Spouse name: Not on file   Number of children: Not on file   Years of education: Not on file   Highest education level: Not on file  Occupational History   Not on file  Tobacco Use   Smoking status: Never   Smokeless tobacco: Never  Vaping Use   Vaping status: Never Used  Substance and Sexual Activity   Alcohol use: No    Alcohol/week: 0.0 standard drinks of alcohol   Drug use: No   Sexual activity: Never  Other Topics Concern   Not on file  Social History Narrative   Not on file   Social Determinants of Health  Financial Resource Strain: Low Risk  (08/21/2023)   Overall Financial Resource Strain (CARDIA)    Difficulty of Paying Living Expenses: Not hard at all  Food Insecurity: No Food Insecurity (08/21/2023)   Hunger Vital Sign    Worried About Running Out of Food in the Last Year: Never true    Ran Out of Food in the Last Year: Never true  Transportation Needs: No Transportation Needs (08/21/2023)   PRAPARE - Administrator, Civil Service (Medical): No    Lack of Transportation (Non-Medical): No  Physical Activity: Inactive (08/21/2023)   Exercise Vital Sign    Days of Exercise per Week: 0 days    Minutes of Exercise per Session: 0 min  Stress: No Stress Concern Present (08/21/2023)   [employer] of Occupational Health - Occupational Stress Questionnaire    Feeling of Stress : Only a little  Social Connections: Moderately Isolated (08/21/2023)   Social Connection and Isolation Panel [NHANES]    Frequency of Communication with Friends and Family: Three times a week    Frequency of Social Gatherings with Friends and Family: Three times a week    Attends Religious Services: Never    Active Member of Clubs or Organizations: No    Attends Banker Meetings: Never    Marital Status: Married    Tobacco Counseling Counseling given: Not  Answered   Clinical Intake:  Pre-visit preparation completed: Yes  Pain : No/denies pain     Nutritional Risks: Nausea/ vomitting/ diarrhea (nauseous since starting new medicine) Diabetes: Yes CBG done?: No Did pt. bring in CBG monitor from home?: No  How often do you need to have someone help you when you read instructions, pamphlets, or other written materials from your doctor or pharmacy?: 1 - Never  Interpreter Needed?: No  Information entered by :: NAllen LPN   Activities of Daily Living    08/21/2023    3:03 PM  In your present state of health, do you have any difficulty performing the following activities:  Hearing? 0  Vision? 0  Difficulty concentrating or making decisions? 0  Walking or climbing stairs? 0  Dressing or bathing? 0  Doing errands, shopping? 0  Preparing Food and eating ? N  Using the Toilet? N  In the past six months, have you accidently leaked urine? Y  Comment chronic  Do you have problems with loss of bowel control? N  Managing your Medications? N  Managing your Finances? N  Housekeeping or managing your Housekeeping? N    Patient Care Team: Tower, Audrie Gallus, MD as PCP - General (Family Medicine) Lockie Mola, MD as Consulting Physician (Ophthalmology) Lamar Blinks, MD as Consulting Physician (Cardiology) Mosetta Pigeon, MD as Consulting Physician (Internal Medicine) Kathyrn Sheriff, [facility] (Inactive) as Pharmacist (Pharmacist)  Indicate any recent Medical Services you may have received from other than [facility] providers in the past year (date may be approximate).     Assessment:   This is a routine wellness examination for [facility].  Hearing/Vision screen Hearing Screening - Comments:: Denies hearing issues Vision Screening - Comments:: Regular eye exams, [facility]  Dietary issues and exercise activities discussed:     Goals Addressed             This Visit's Progress    Patient Stated       08/21/2023,  maintain current health       Depression Screen    08/21/2023    3:16 PM 03/16/2023  8:57 AM 06/13/2022    9:10 AM 03/12/2022    8:58 AM 10/14/2021    3:59 PM 10/08/2020    8:51 AM 10/03/2019    8:36 AM  PHQ 2/9 Scores  PHQ - 2 Score 0 0 0 0 0 0 0    Fall Risk    08/21/2023    3:14 PM 03/16/2023    8:57 AM 06/13/2022    9:10 AM 03/12/2022    8:58 AM 10/08/2020    8:51 AM  Fall Risk   Falls in the past year? 0 0 1 0   Number falls in past yr: 0 0 0  0  Injury with Fall? 0 0 1  0  Risk for fall due to : Medication side effect;Impaired balance/gait No Fall Risks     Follow up Falls prevention discussed;Falls evaluation completed Falls evaluation completed Falls evaluation completed Falls evaluation completed Falls evaluation completed    MEDICARE RISK AT HOME: Medicare Risk at Home Any stairs in or around the home?: Yes If so, are there any without handrails?: No Home free of loose throw rugs in walkways, pet beds, electrical cords, etc?: Yes Adequate lighting in your home to reduce risk of falls?: Yes Life alert?: No Use of a cane, walker or w/c?: No Grab bars in the bathroom?: Yes Shower chair or bench in shower?: No Elevated toilet seat or a handicapped toilet?: Yes  TIMED UP AND GO:  Was the test performed?  No    Cognitive Function:    09/24/2018   12:53 PM 03/19/2017    1:19 PM 03/18/2016   10:12 AM  MMSE - Mini Mental State Exam  Orientation to time 5 5 5   Orientation to Place 5 5 5   Registration 3 3 3   Attention/ Calculation 0 0 0  Recall 3 2 3   Recall-comments  pt was unable to recall 1 of 3 words   Language- name 2 objects 0 0 0  Language- repeat 1 1 1   Language- follow 3 step command 3 3 3   Language- read & follow direction 0 0 0  Write a sentence 0 0 0  Copy design 0 0 0  Total score 20 19 20         08/21/2023    3:17 PM  6CIT Screen  What Year? 0 points  What month? 0 points  What time? 0 points  Count back from 20 0 points  Months in  reverse 4 points  Repeat phrase 2 points  Total Score 6 points    Immunizations Immunization History  Administered Date(s) Administered   Fluad Quad(high Dose 65+) 09/02/2019, 10/11/2019   H1N1 12/28/2008   Influenza Split 10/23/2011, 09/15/2012   Influenza Whole 09/22/1999, 10/01/2007, 10/02/2008, 09/27/2009, 10/09/2010   Influenza, Quadrivalent, Recombinant, Inj, Pf 09/22/2017   Influenza, Seasonal, Injecte, Preservative Fre 09/16/2018   Influenza,inj,Quad PF,6+ Mos 09/13/2014, 09/17/2015, 09/12/2016   Influenza-Unspecified 09/20/2013, 10/01/2020, 09/21/2022   PFIZER(Purple Top)SARS-COV-2 Vaccination 01/17/2020, 01/28/2020, 10/15/2020   Pneumococcal Conjugate-13 12/08/2014   Pneumococcal Polysaccharide-23 07/23/2003   Td 12/04/1999, 03/01/2008   Tdap 05/20/2011, 07/29/2018   Zoster, Live 03/31/2015    TDAP status: Up to date  Flu Vaccine status: Due, Education has been provided regarding the importance of this vaccine. Advised may receive this vaccine at local pharmacy or Health Dept. Aware to provide a copy of the vaccination record if obtained from local pharmacy or Health Dept. Verbalized acceptance and understanding.  Pneumococcal vaccine status: Up to date  Covid-19 vaccine status:  Information provided on how to obtain vaccines.   Qualifies for Shingles Vaccine? Yes   Zostavax completed Yes   Shingrix Completed?: No.    Education has been provided regarding the importance of this vaccine. Patient has been advised to call insurance company to determine out of pocket expense if they have not yet received this vaccine. Advised may also receive vaccine at local pharmacy or Health Dept. Verbalized acceptance and understanding.  Screening Tests Health Maintenance  Topic Date Due   Zoster Vaccines- Shingrix (1 of 2) 07/06/1945   COVID-19 Vaccine (4 - 2023-24 season) 08/22/2022   INFLUENZA VACCINE  07/23/2023   FOOT EXAM  10/21/2023   OPHTHALMOLOGY EXAM  10/21/2023    HEMOGLOBIN A1C  12/16/2023   Medicare Annual Wellness (AWV)  08/20/2024   DTaP/Tdap/Td (5 - Td or Tdap) 07/29/2028   Pneumonia Vaccine 77+ Years old  Completed   HPV VACCINES  Aged Out    Health Maintenance  Health Maintenance Due  Topic Date Due   Zoster Vaccines- Shingrix (1 of 2) 07/06/1945   COVID-19 Vaccine (4 - 2023-24 season) 08/22/2022   INFLUENZA VACCINE  07/23/2023    Colorectal cancer screening: No longer required.   Lung Cancer Screening: (Low Dose CT Chest recommended if Age 44-80 years, 20 pack-year currently smoking OR have quit w/in 15years.) does not qualify.   Lung Cancer Screening Referral: no  Additional Screening:  Hepatitis C Screening: does not qualify;   Vision Screening: Recommended annual ophthalmology exams for early detection of glaucoma and other disorders of the eye. Is the patient up to date with their annual eye exam?  Yes  Who is the provider or what is the name of the office in which the patient attends annual eye exams? [facility] If pt is not established with a provider, would they like to be referred to a provider to establish care? No .   Dental Screening: Recommended annual dental exams for proper oral hygiene  Diabetic Foot Exam: Diabetic Foot Exam: Completed 10/20/2022  Community Resource Referral / Chronic Care Management: CRR required this visit?  No   CCM required this visit?  No     Plan:     I have personally reviewed and noted the following in the patient's chart:   Medical and social history Use of alcohol, tobacco or illicit drugs  Current medications and supplements including opioid prescriptions. Patient is not currently taking opioid prescriptions. Functional ability and status Nutritional status Physical activity Advanced directives List of other physicians Hospitalizations, surgeries, and ER visits in previous 12 months Vitals Screenings to include cognitive, depression, and falls Referrals and  appointments  In addition, I have reviewed and discussed with patient certain preventive protocols, quality metrics, and best practice recommendations. A written personalized care plan for preventive services as well as general preventive health recommendations were provided to patient.     Barb Merino, LPN   1/61/0960   After Visit Summary: (Pick Up) Due to this being a telephonic visit, with patients personalized plan was offered to patient and patient has requested to Pick up at office.  Nurse Notes: none

## 2023-08-21 NOTE — Patient Instructions (Signed)
David Henry , Thank you for taking time to come for your Medicare Wellness Visit. I appreciate your ongoing commitment to your health goals. Please review the following plan we discussed and let me know if I can assist you in the future.   Referrals/Orders/Follow-Ups/Clinician Recommendations: none  This is a list of the screening recommended for you and due dates:  Health Maintenance  Topic Date Due   Zoster (Shingles) Vaccine (1 of 2) 07/06/1945   COVID-19 Vaccine (4 - 2023-24 season) 08/22/2022   Flu Shot  07/23/2023   Complete foot exam   10/21/2023   Eye exam for diabetics  10/21/2023   Hemoglobin A1C  12/16/2023   Medicare Annual Wellness Visit  08/20/2024   DTaP/Tdap/Td vaccine (5 - Td or Tdap) 07/29/2028   Pneumonia Vaccine  Completed   HPV Vaccine  Aged Out    Advanced directives: (Copy Requested) Please bring a copy of your health care power of attorney and living will to the office to be added to your chart at your convenience.  Next Medicare Annual Wellness Visit scheduled for next year: Yes  insert Preventive Care attachment Insert FALL PREVENTION attachment if needed

## 2023-09-15 ENCOUNTER — Ambulatory Visit: Payer: Medicare PPO | Admitting: Family Medicine

## 2023-09-16 ENCOUNTER — Ambulatory Visit: Payer: Medicare PPO | Admitting: Family Medicine

## 2023-09-23 ENCOUNTER — Encounter: Payer: Self-pay | Admitting: Family Medicine

## 2023-09-23 ENCOUNTER — Ambulatory Visit: Payer: Medicare PPO | Admitting: Family Medicine

## 2023-09-23 VITALS — BP 128/62 | HR 74 | Temp 97.5°F | Ht 66.5 in | Wt 138.5 lb

## 2023-09-23 DIAGNOSIS — Z794 Long term (current) use of insulin: Secondary | ICD-10-CM | POA: Diagnosis not present

## 2023-09-23 DIAGNOSIS — I10 Essential (primary) hypertension: Secondary | ICD-10-CM | POA: Diagnosis not present

## 2023-09-23 DIAGNOSIS — E039 Hypothyroidism, unspecified: Secondary | ICD-10-CM

## 2023-09-23 DIAGNOSIS — E1122 Type 2 diabetes mellitus with diabetic chronic kidney disease: Secondary | ICD-10-CM

## 2023-09-23 DIAGNOSIS — N183 Chronic kidney disease, stage 3 unspecified: Secondary | ICD-10-CM

## 2023-09-23 DIAGNOSIS — F430 Acute stress reaction: Secondary | ICD-10-CM | POA: Insufficient documentation

## 2023-09-23 DIAGNOSIS — Z23 Encounter for immunization: Secondary | ICD-10-CM | POA: Diagnosis not present

## 2023-09-23 DIAGNOSIS — R634 Abnormal weight loss: Secondary | ICD-10-CM | POA: Diagnosis not present

## 2023-09-23 DIAGNOSIS — R2689 Other abnormalities of gait and mobility: Secondary | ICD-10-CM | POA: Insufficient documentation

## 2023-09-23 DIAGNOSIS — R350 Frequency of micturition: Secondary | ICD-10-CM | POA: Diagnosis not present

## 2023-09-23 DIAGNOSIS — Z636 Dependent relative needing care at home: Secondary | ICD-10-CM | POA: Insufficient documentation

## 2023-09-23 LAB — CBC WITH DIFFERENTIAL/PLATELET
Basophils Absolute: 0 10*3/uL (ref 0.0–0.1)
Basophils Relative: 0.5 % (ref 0.0–3.0)
Eosinophils Absolute: 0.1 10*3/uL (ref 0.0–0.7)
Eosinophils Relative: 0.8 % (ref 0.0–5.0)
HCT: 38.7 % — ABNORMAL LOW (ref 39.0–52.0)
Hemoglobin: 12.7 g/dL — ABNORMAL LOW (ref 13.0–17.0)
Lymphocytes Relative: 13 % (ref 12.0–46.0)
Lymphs Abs: 1.1 10*3/uL (ref 0.7–4.0)
MCHC: 32.8 g/dL (ref 30.0–36.0)
MCV: 104 fL — ABNORMAL HIGH (ref 78.0–100.0)
Monocytes Absolute: 0.6 10*3/uL (ref 0.1–1.0)
Monocytes Relative: 7.4 % (ref 3.0–12.0)
Neutro Abs: 6.5 10*3/uL (ref 1.4–7.7)
Neutrophils Relative %: 78.3 % — ABNORMAL HIGH (ref 43.0–77.0)
Platelets: 245 10*3/uL (ref 150.0–400.0)
RBC: 3.72 Mil/uL — ABNORMAL LOW (ref 4.22–5.81)
RDW: 14.2 % (ref 11.5–15.5)
WBC: 8.3 10*3/uL (ref 4.0–10.5)

## 2023-09-23 LAB — COMPREHENSIVE METABOLIC PANEL
ALT: 32 U/L (ref 0–53)
AST: 21 U/L (ref 0–37)
Albumin: 4.1 g/dL (ref 3.5–5.2)
Alkaline Phosphatase: 87 U/L (ref 39–117)
BUN: 36 mg/dL — ABNORMAL HIGH (ref 6–23)
CO2: 27 meq/L (ref 19–32)
Calcium: 9.2 mg/dL (ref 8.4–10.5)
Chloride: 103 meq/L (ref 96–112)
Creatinine, Ser: 1.82 mg/dL — ABNORMAL HIGH (ref 0.40–1.50)
GFR: 31.25 mL/min — ABNORMAL LOW (ref >60.00)
Glucose, Bld: 368 mg/dL — ABNORMAL HIGH (ref 70–99)
Potassium: 4.7 meq/L (ref 3.5–5.1)
Sodium: 138 meq/L (ref 135–145)
Total Bilirubin: 0.8 mg/dL (ref 0.2–1.2)
Total Protein: 6.3 g/dL (ref 6.0–8.3)

## 2023-09-23 LAB — POC URINALSYSI DIPSTICK (AUTOMATED)
Bilirubin, UA: NEGATIVE
Blood, UA: NEGATIVE
Glucose, UA: POSITIVE — AB
Ketones, UA: NEGATIVE
Leukocytes, UA: NEGATIVE
Nitrite, UA: NEGATIVE
Protein, UA: NEGATIVE
Spec Grav, UA: 1.02 (ref 1.010–1.025)
Urobilinogen, UA: 0.2 U/dL
pH, UA: 6 (ref 5.0–8.0)

## 2023-09-23 LAB — TSH: TSH: 1.69 u[IU]/mL (ref 0.35–5.50)

## 2023-09-23 MED ORDER — PAROXETINE HCL 10 MG PO TABS: 10.0000 mg | ORAL_TABLET | Freq: Every day | ORAL | 1 refills | Status: DC

## 2023-09-23 NOTE — Assessment & Plan Note (Signed)
Suspect muscle loss due to advanced age, caregiver stress and diabetes   Discussed plan for increase protein in diet and home PT

## 2023-09-23 NOTE — Progress Notes (Signed)
Subjective:    Patient ID: David Henry, male    DOB: 1926/03/14, 89 y.o.   MRN: [id number]  HPI  Wt Readings from Last 3 Encounters:  09/23/23 138 lb 8 oz (62.8 kg)  06/16/23 147 lb (66.7 kg)  03/16/23 155 lb (70.3 kg)   22.02 kg/m  Vitals:   09/23/23 1144  BP: 128/62  Pulse: 74  Temp: (!) 97.5 F (36.4 C)  SpO2: 98%    Pt presents with c/o fatigue and weight loss and poor balance/ wobbling in setting of advanced age of 3  Wife was dx with stage 3 lymphoma (terminal) in treatment  He takes care of her am and pm -is hard  Wobbles when he walks  Weight is down significantly  Does not feel well much of the time   Anxiety is bad  Had considered medication for this   Daughter has reached out for help  Home care  He resists this   She is stable     HTN bp is stable today  No cp or palpitations or headaches or edema  No side effects to medicines  BP Readings from Last 3 Encounters:  09/23/23 128/62  06/16/23 122/60  03/16/23 128/64      DM2 Seeing endocrinology Dr Huntley Dec  Last visit in mid August   A1c 7.5 in June  Microalb ratio 2.2 on 03/09/23  At last visit some changes were made in medicine to help control overall glucose but avoid hypoglycemic Was prescription ozempic 0.25 mg weekly- to go up at 4 weeks if tolerated  Was instructed upon starting this to stop [place] and lanus nightly but to continue glimiperide 2 mg daily  It was discussed that glucose will take some time to level off and may be high to start   2 mo follow up was planned -this is coming up on 10/17   Results for orders placed or performed in visit on 09/23/23  POCT Urinalysis Dipstick (Automated)  Result Value Ref Range   Color, UA Light Yellow    Clarity, UA Clear    Glucose, UA Positive (A) Negative   Bilirubin, UA Negative    Ketones, UA Negative    Spec Grav, UA 1.020 1.010 - 1.025   Blood, UA Negative    pH, UA 6.0 5.0 - 8.0   Protein, UA Negative  Negative   Urobilinogen, UA 0.2 0.2 or 1.0 E.U./dL   Nitrite, UA Negative    Leukocytes, UA Negative Negative     Hypothyroid Lab Results  Component Value Date   TSH 2.09 03/09/2023   Levothyroxine 75 mcg daily  Ckd Lab Results  Component Value Date   NA 142 03/09/2023   K 4.2 03/09/2023   CO2 27 03/09/2023   GLUCOSE 98 03/09/2023   BUN 33 (H) 03/09/2023   CREATININE 1.88 (H) 03/09/2023   CALCIUM 9.6 03/09/2023   GFR 30.17 (L) 03/09/2023   GFRNONAA 30 (L) 03/28/2021   Lab Results  Component Value Date   WBC 8.7 03/09/2023   HGB 12.3 (L) 03/09/2023   HCT 36.8 (L) 03/09/2023   MCV 103.2 (H) 03/09/2023   PLT 224.0 03/09/2023   Lab Results  Component Value Date   VITAMINB12 1,142 (H) 03/27/2021       Patient Active Problem List   Diagnosis Date Noted   Weight loss 09/23/2023   Poor balance 09/23/2023   Stress reaction 09/23/2023   Frequent urination 09/23/2023   Caregiver stress 09/23/2023   Plantar  wart, left foot 03/16/2023   Renal stone 03/22/2021   Macrocytosis 10/05/2019   Routine general medical examination at a health care facility 03/09/2016   Constipation 09/17/2015   Encounter for Medicare annual wellness exam 12/08/2014   Chronic kidney disease (CKD) 05/24/2013   Anemia 11/23/2012   Hypothyroid 11/23/2012   GERD 11/29/2010   Coronary atherosclerosis 04/18/2007   Controlled type 2 diabetes mellitus with chronic kidney disease (HCC) 04/15/2007   Hyperlipidemia associated with type 2 diabetes mellitus (HCC) 04/15/2007   Essential hypertension 04/15/2007   MYOCARDIAL INFARCTION, HX OF 04/15/2007   ACTINIC KERATOSIS 04/15/2007   Past Medical History:  Diagnosis Date   Arthritis    Basal cell carcinoma 08/15/2021   right cheek, EDC   CAD (coronary artery disease)    3 stents   Colon polyp    Diabetes mellitus    type II   Dyspnea    with exertion   GERD (gastroesophageal reflux disease)    Hyperlipidemia    Hypertension     Hypothyroidism    Kidney stones    stage III CKD   Neuropathy    Peripheral vascular disease (HCC)    neuropathy in feet   Past Surgical History:  Procedure Laterality Date   APPENDECTOMY  1984   CATARACT EXTRACTION Bilateral 04/2001   CHOLECYSTECTOMY  1989   COLONOSCOPY N/A 2/14   hemorrhoids and tics (after pos ifob card)   CORONARY ANGIOPLASTY  2006   3 stents   CYSTOSCOPY/URETEROSCOPY/HOLMIUM LASER/STENT PLACEMENT Left 03/24/2021   Procedure: CYSTOSCOPY/URETEROSCOPY, RETROGRADE AND /STENT PLACEMENT;  Surgeon: Riki Altes, MD;  Location: [facility];  Service: Urology;  Laterality: Left;   ESOPHAGOGASTRODUODENOSCOPY N/A 2/14   RETINAL DETACHMENT SURGERY Right 2003   SHOULDER ARTHROSCOPY WITH ROTATOR CUFF REPAIR AND SUBACROMIAL DECOMPRESSION Right 12/30/2017   Procedure: SHOULDER ARTHROSCOPY WITH ROTATOR CUFF REPAIR AND SUBACROMIAL DECOMPRESSION;  Surgeon: Erin Sons, MD;  Location: [facility];  Service: Orthopedics;  Laterality: Right;   SHOULDER SURGERY  2012   Lt shoulder repair   Social History   Tobacco Use   Smoking status: Never   Smokeless tobacco: Never  Vaping Use   Vaping status: Never Used  Substance Use Topics   Alcohol use: No    Alcohol/week: 0.0 standard drinks of alcohol   Drug use: No   Family History  Problem Relation Age of Onset   Cancer Brother        prostate CA   Heart disease Mother    Stroke Father    Kidney disease Sister    Kidney disease Sister    Clotting disorder Sister    COPD Brother    Heart disease Brother    Allergies  Allergen Reactions   Adhesive [Tape] Dermatitis    Skin is thin so it is tough to take off.  Use paper tape   Azithromycin Nausea Only    REACTION: nausea   Celecoxib Other (See Comments)    Raises blood pressure   Zocor [Simvastatin] Other (See Comments)    Muscle pain   Current Outpatient Medications on File Prior to Visit  Medication Sig Dispense Refill   acetaminophen (TYLENOL) 650 MG CR tablet  Take 650 mg by mouth every 8 (eight) hours as needed.     amLODipine (NORVASC) 5 MG tablet Take 0.5 tablets (2.5 mg total) by mouth daily. 1 tablet 0   aspirin 81 MG tablet Take 81 mg by mouth daily.     Continuous Blood Gluc Receiver (FREESTYLE  LIBRE 3 READER) DEVI 1 each by Does not apply route as directed. Use with sensors to monitor glucose continuously 1 each 0   Continuous Blood Gluc Sensor (FREESTYLE LIBRE 3 SENSOR) MISC Place 1 sensor on the skin every 14 days. Use to check glucose continuously 6 each 3   ferrous sulfate (FEROSUL) 325 (65 FE) MG tablet TAKE ONE TABLET BY MOUTH ONCE A DAY WITH FOOD. 90 tablet 2   glimepiride (AMARYL) 4 MG tablet Take 0.5 tablets (2 mg total) by mouth daily with breakfast. 180 tablet 1   levothyroxine (SYNTHROID) 75 MCG tablet Take 1 tablet (75 mcg total) by mouth daily before breakfast. 90 tablet 2   pantoprazole (PROTONIX) 40 MG tablet TAKE ONE TABLET BY MOUTH ONCE A DAY 90 tablet 1   rosuvastatin (CRESTOR) 5 MG tablet TAKE ONE TABLET BY MOUTH EVERY OTHER DAY 45 tablet 3   Semaglutide,0.25 or 0.5MG /DOS, 2 MG/3ML SOPN Inject into the skin.     senna (SENOKOT) 8.6 MG tablet Take 1 tablet by mouth 2 (two) times daily.     tamsulosin (FLOMAX) 0.4 MG CAPS capsule TAKE ONE CAPSULE BY MOUTH ONCE DAILY 90 capsule 3   torsemide (DEMADEX) 10 MG tablet Take 10 mg by mouth daily.     VITAMIN D PO Take 2,000 Units by mouth daily.     [DISCONTINUED] omeprazole (PRILOSEC OTC) 20 MG tablet Take 1 tablet (20 mg total) by mouth daily. 90 tablet 1   No current facility-administered medications on file prior to visit.    Review of Systems  Constitutional:  Positive for fatigue. Negative for activity change, appetite change, fever and unexpected weight change.  HENT:  Negative for congestion, rhinorrhea, sore throat and trouble swallowing.   Eyes:  Negative for pain, redness, itching and visual disturbance.  Respiratory:  Negative for cough, chest tightness, shortness of  breath and wheezing.   Cardiovascular:  Negative for chest pain and palpitations.  Gastrointestinal:  Negative for abdominal pain, blood in stool, constipation, diarrhea and nausea.  Endocrine: Negative for cold intolerance, heat intolerance, polydipsia and polyuria.  Genitourinary:  Positive for frequency. Negative for difficulty urinating, dysuria, hematuria and urgency.  Musculoskeletal:  Positive for arthralgias and back pain. Negative for joint swelling and myalgias.  Skin:  Negative for pallor and rash.  Neurological:  Negative for dizziness, tremors, weakness, numbness and headaches.  Hematological:  Negative for adenopathy. Does not bruise/bleed easily.  Psychiatric/Behavioral:  Positive for decreased concentration, dysphoric mood and sleep disturbance. The patient is nervous/anxious.        Objective:   Physical Exam Constitutional:      General: He is not in acute distress.    Appearance: Normal appearance. He is well-developed and normal weight. He is not ill-appearing or diaphoretic.  HENT:     Head: Normocephalic and atraumatic.     Right Ear: Tympanic membrane and ear canal normal.     Left Ear: Tympanic membrane and ear canal normal.     Mouth/Throat:     Mouth: Mucous membranes are moist.  Eyes:     General: No scleral icterus.    Conjunctiva/sclera: Conjunctivae normal.     Pupils: Pupils are equal, round, and reactive to light.  Neck:     Thyroid: No thyromegaly.     Vascular: No carotid bruit or JVD.  Cardiovascular:     Rate and Rhythm: Normal rate and regular rhythm.     Heart sounds: Normal heart sounds.     No gallop.  Pulmonary:     Effort: Pulmonary effort is normal. No respiratory distress.     Breath sounds: Normal breath sounds. No wheezing or rales.  Abdominal:     General: There is no distension or abdominal bruit.     Palpations: Abdomen is soft. There is no mass.     Tenderness: There is no abdominal tenderness. There is no right CVA  tenderness, left CVA tenderness, guarding or rebound.  Musculoskeletal:     Cervical back: Normal range of motion and neck supple. No tenderness.     Right lower leg: No edema.     Left lower leg: No edema.     Comments: Kyphosis   Lymphadenopathy:     Cervical: No cervical adenopathy.  Skin:    General: Skin is warm and dry.     Coloration: Skin is not pale.     Findings: No rash.  Neurological:     Mental Status: He is alert.     Cranial Nerves: No cranial nerve deficit.     Coordination: Coordination normal.     Deep Tendon Reflexes: Reflexes are normal and symmetric. Reflexes normal.     Comments: Generally weak (not focal)   Gait is slow /shuffling and wide based   Psychiatric:        Attention and Perception: Attention normal.        Mood and Affect: Mood is anxious.        Cognition and Memory: Cognition normal.     Comments: Repeats self occational   Candidly discusses symptoms and stressors             Assessment & Plan:   Problem List Items Addressed This Visit       Cardiovascular and Mediastinum   Essential hypertension    bp in fair control at this time  BP Readings from Last 1 Encounters:  09/23/23 128/62   No changes needed Most recent labs reviewed  Disc lifstyle change with low sodium diet and exercise  Under care of nephrology for CKD Amlodipine 5 mg daily was too much- he cut to 2.5 mg daily now  No hypotension  Torsemide was reduced to 10 mg three days weekly  Utd nephrology care         Relevant Orders   CBC with Differential/Platelet   Comprehensive metabolic panel   TSH     Endocrine   Controlled type 2 diabetes mellitus with chronic kidney disease (HCC)    Now on ozempic 0.5 and glimiperide in effort to prevent low glucose levels Still having highs in 300s and lows in 60s  Will follow up with endocrinology  Encouraged regular meals with protein and bedtime protein snack to prevent early am lows  Ozempic is also causing  reduced appetite and weight loss (afraid he is losing muscle ) Follow up with endo is mid month      Hypothyroid    Tsh today  Levothyroxine 75 mcg  Weight loss noted -may be multifactorial      Relevant Orders   TSH     Other   Caregiver stress    Caring for wife with cancer Neglecting himself  Long conversation with pt and daughter re: need to accept help from the outside  Family will reach out to wife's oncologist Ultimately needs help in night time hours  Social work consult/comm care done today      Frequent urination    Suspect due to BPH  Urinalysis ordered as well  Urinalysis positive for glucose and otherwise clear       Relevant Orders   POCT Urinalysis Dipstick (Automated) (Completed)   Poor balance    Suspect muscle loss due to advanced age, caregiver stress and diabetes   Discussed plan for increase protein in diet and home PT       Stress reaction    Caregiver stress Wife with cancer Anxious Occational tearful Reviewed stressors/ coping techniques/symptoms/ support sources/ tx options and side effects in detail today Declines counseling Did reach out to social work  Will try paxil 10 mg daily and follow up 3-4 wk Discussed expectations of SSRI medication including time to effectiveness and mechanism of action, also poss of side effects (early and late)- including mental fuzziness, weight or appetite change, nausea and poss of worse dep or anxiety (even suicidal thoughts)  Pt voiced understanding and will stop med and update if this occurs        Relevant Medications   PARoxetine (PAXIL) 10 MG tablet   Weight loss - Primary    Multifactorial  On ozempic 0.5 mg weekly  Glucose is sometimes high (and low)  Caregiving for wife with cancer   Lab today  Discussed need for protein and also muscle gain        Other Visit Diagnoses     Need for influenza vaccination       Relevant Orders   Flu Vaccine Trivalent High Dose (Fluad) (Completed)

## 2023-09-23 NOTE — Assessment & Plan Note (Signed)
Now on ozempic 0.5 and glimiperide in effort to prevent low glucose levels Still having highs in 300s and lows in 60s  Will follow up with endocrinology  Encouraged regular meals with protein and bedtime protein snack to prevent early am lows  Ozempic is also causing reduced appetite and weight loss (afraid he is losing muscle ) Follow up with endo is mid month

## 2023-09-23 NOTE — Assessment & Plan Note (Signed)
Caregiver stress Wife with cancer Anxious Occational tearful Reviewed stressors/ coping techniques/symptoms/ support sources/ tx options and side effects in detail today Declines counseling Did reach out to social work  Will try paxil 10 mg daily and follow up 3-4 wk Discussed expectations of SSRI medication including time to effectiveness and mechanism of action, also poss of side effects (early and late)- including mental fuzziness, weight or appetite change, nausea and poss of worse dep or anxiety (even suicidal thoughts)  Pt voiced understanding and will stop med and update if this occurs

## 2023-09-23 NOTE — Assessment & Plan Note (Signed)
Caring for wife with cancer Neglecting himself  Long conversation with pt and daughter re: need to accept help from the outside  Family will reach out to wife's oncologist Ultimately needs help in night time hours  Social work consult/comm care done today

## 2023-09-23 NOTE — Assessment & Plan Note (Addendum)
Suspect due to BPH  Urinalysis ordered as well   Urinalysis positive for glucose and otherwise clear

## 2023-09-23 NOTE — Patient Instructions (Addendum)
I need you to eat protein more often to fight the weight loss and give you more energy   To help prevent low sugar in night/early am- you need to eat a bedtime snack with protein   The following are examples of protein in diet  Meat  Fish  Eggs  Dairy products  Soy products  Oat milk  Almond milk Nuts and nut butters  Dried beans (eat ore of these please)   For irritated area on penis - keep it clean with soap and water, then dry well and use a barrier cream like A and D or desitin over the counter   Start the generic for paxil 10 mg one pill daily in evening  If any intolerable side effects or if it makes you feel worse (depressed or anxious) then stop it and let me know   Let's check labs today  And urinalysis if you can   I will put in a referral to social work- you will get a call  To see what your needs are (physicially and emotionally)  I think physical therapy may be helpful for strength and balance

## 2023-09-23 NOTE — Assessment & Plan Note (Signed)
Tsh today  Levothyroxine 75 mcg  Weight loss noted -may be multifactorial

## 2023-09-23 NOTE — Assessment & Plan Note (Signed)
Multifactorial  On ozempic 0.5 mg weekly  Glucose is sometimes high (and low)  Caregiving for wife with cancer   Lab today  Discussed need for protein and also muscle gain

## 2023-09-23 NOTE — Assessment & Plan Note (Signed)
bp in fair control at this time  BP Readings from Last 1 Encounters:  09/23/23 128/62   No changes needed Most recent labs reviewed  Disc lifstyle change with low sodium diet and exercise  Under care of nephrology for CKD Amlodipine 5 mg daily was too much- he cut to 2.5 mg daily now  No hypotension  Torsemide was reduced to 10 mg three days weekly  Utd nephrology care

## 2023-09-24 ENCOUNTER — Telehealth: Payer: Self-pay | Admitting: *Deleted

## 2023-09-24 NOTE — Progress Notes (Signed)
Care Coordination   Note   09/24/2023 Name: David Henry MRN: [id number] DOB: May 06, 1926  David Henry is a 89 y.o. year old male who sees Tower, Audrie Gallus, MD for primary care. I reached out to [name] by phone today to offer care coordination services.  [name] was given information about Care Coordination services today including:   The Care Coordination services include support from the care team which includes your Nurse Coordinator, Clinical Social Worker, or Pharmacist.  The Care Coordination team is here to help remove barriers to the health concerns and goals most important to you. Care Coordination services are voluntary, and the patient may decline or stop services at any time by request to their care team member.   Care Coordination Consent Status: Patient agreed to services and verbal consent obtained.   Follow up plan:  Telephone appointment with care coordination team member scheduled for:  09/29/2023  Encounter Outcome:  Patient Scheduled from referral   Burman Nieves, [facility] Care Coordination Care Guide Direct Dial: [phone]

## 2023-09-24 NOTE — Progress Notes (Signed)
Care Coordination  Outreach Note  09/24/2023 Name: KAENAN HIMMELBERGER MRN: [id number] DOB: 06-28-1926   Care Coordination Outreach Attempts: An unsuccessful telephone outreach was attempted today to offer the patient information about available care coordination services.  Follow Up Plan:  Additional outreach attempts will be made to offer the patient care coordination information and services.   Encounter Outcome:  No Answer  Burman Nieves, CCMA Care Coordination Care Guide Direct Dial: [phone]

## 2023-09-25 DIAGNOSIS — N183 Chronic kidney disease, stage 3 unspecified: Secondary | ICD-10-CM | POA: Diagnosis not present

## 2023-09-25 DIAGNOSIS — D631 Anemia in chronic kidney disease: Secondary | ICD-10-CM | POA: Diagnosis not present

## 2023-09-25 DIAGNOSIS — E785 Hyperlipidemia, unspecified: Secondary | ICD-10-CM | POA: Diagnosis not present

## 2023-09-25 DIAGNOSIS — I129 Hypertensive chronic kidney disease with stage 1 through stage 4 chronic kidney disease, or unspecified chronic kidney disease: Secondary | ICD-10-CM | POA: Diagnosis not present

## 2023-09-25 DIAGNOSIS — E1140 Type 2 diabetes mellitus with diabetic neuropathy, unspecified: Secondary | ICD-10-CM | POA: Diagnosis not present

## 2023-09-25 DIAGNOSIS — E1122 Type 2 diabetes mellitus with diabetic chronic kidney disease: Secondary | ICD-10-CM | POA: Diagnosis not present

## 2023-09-25 DIAGNOSIS — E1151 Type 2 diabetes mellitus with diabetic peripheral angiopathy without gangrene: Secondary | ICD-10-CM | POA: Diagnosis not present

## 2023-09-25 DIAGNOSIS — F419 Anxiety disorder, unspecified: Secondary | ICD-10-CM | POA: Diagnosis not present

## 2023-09-25 DIAGNOSIS — E1169 Type 2 diabetes mellitus with other specified complication: Secondary | ICD-10-CM | POA: Diagnosis not present

## 2023-09-29 ENCOUNTER — Ambulatory Visit: Payer: Self-pay | Admitting: *Deleted

## 2023-09-29 NOTE — Patient Outreach (Addendum)
Care Coordination   Initial Visit Note   09/29/2023 Name: David Henry MRN: [id number] DOB: [birth year]  David Henry is a 89 y.o. year old male who sees Tower, Audrie Gallus, MD for primary care. I spoke with  Rico Ala daughter by phone today.  What matters to the patients health and wellness today?  Patient's daughter confirms that patient has no community resource needs at this time. Patient's daughter has been connected with [facility] social worker as well to assist with community resource needs that may arise.   Goals Addressed             This Visit's Progress    care coordination activities       Care Coordination Interventions: Needs assessment completed,patient's daughter confirms providing assistance with patient's(and spouse's) daily care needs Patient's daughter confirms having no community resources needs at this but understands that condition may change and agrees to contact this Child psychotherapist with any additional community resource needs           SDOH assessments and interventions completed:  Yes  SDOH Interventions Today    Flowsheet Row Most Recent Value  SDOH Interventions   Food Insecurity Interventions Intervention Not Indicated  Housing Interventions Intervention Not Indicated  Transportation Interventions Intervention Not Indicated  Utilities Interventions Intervention Not Indicated  Financial Strain Interventions Intervention Not Indicated  Social Connections Interventions Intervention Not Indicated        Care Coordination Interventions:  Yes, provided  Interventions Today    Flowsheet Row Most Recent Value  Chronic Disease   Chronic disease during today's visit Other  [caregiver stress]  General Interventions   General Interventions Discussed/Reviewed General Interventions Discussed, Level of Care, Doctor Visits  [daughter confirms that pt provides care for his wife who has been recently dx with cancer-since tx on 10/2  spouse's condiiton has improved]  Doctor Visits Discussed/Reviewed Doctor Visits Reviewed  Edilia Bo that PT has been ordered due to muscle weakness]  Level of Care Personal Care Services  [confirmed that patient 's daughter provides patient and spouse with daily care(housekeeping, transportation to medical appts, bill payment) declined need for in home care resources at this time]  Safety Interventions   Safety Discussed/Reviewed Safety Reviewed  [patient's daughter provides transportation to medical appointments-patient driving minimal]       Follow up plan: No further intervention required.   Encounter Outcome:  Patient Visit Completed

## 2023-09-29 NOTE — Patient Instructions (Signed)
Visit Information  Thank you for taking time to visit with me today. Please don't hesitate to contact me if I can be of assistance to you.   Following are the goals we discussed today:   Goals Addressed             This Visit's Progress    care coordination activities       Care Coordination Interventions: Needs assessment completed,patient's daughter confirms providing assistance with patient's(and spouse's) daily care needs Patient's daughter confirms having no community resources needs at this but understands that condition may change and agrees to contact this Child psychotherapist with any additional community resource needs           If you are experiencing a Mental Health or Behavioral Health Crisis or need someone to talk to, please call 911   Patient verbalizes understanding of instructions and care plan provided today and agrees to view in [place]. Active MyChart status and patient understanding of how to access instructions and care plan via MyChart confirmed with patient.     No further follow up required: patient/patient's daughter to contact this Child psychotherapist with any additional community resource needs  [employer], [employer] [place]  [facility], [facility] Licensed Clinical Social Geologist, engineering Dial: [phone]

## 2023-10-01 DIAGNOSIS — N1832 Chronic kidney disease, stage 3b: Secondary | ICD-10-CM | POA: Diagnosis not present

## 2023-10-01 DIAGNOSIS — N2581 Secondary hyperparathyroidism of renal origin: Secondary | ICD-10-CM | POA: Diagnosis not present

## 2023-10-01 DIAGNOSIS — E1129 Type 2 diabetes mellitus with other diabetic kidney complication: Secondary | ICD-10-CM | POA: Diagnosis not present

## 2023-10-01 DIAGNOSIS — N400 Enlarged prostate without lower urinary tract symptoms: Secondary | ICD-10-CM | POA: Diagnosis not present

## 2023-10-01 DIAGNOSIS — I10 Essential (primary) hypertension: Secondary | ICD-10-CM | POA: Diagnosis not present

## 2023-10-02 DIAGNOSIS — D631 Anemia in chronic kidney disease: Secondary | ICD-10-CM | POA: Diagnosis not present

## 2023-10-02 DIAGNOSIS — E1169 Type 2 diabetes mellitus with other specified complication: Secondary | ICD-10-CM | POA: Diagnosis not present

## 2023-10-02 DIAGNOSIS — N183 Chronic kidney disease, stage 3 unspecified: Secondary | ICD-10-CM | POA: Diagnosis not present

## 2023-10-02 DIAGNOSIS — I129 Hypertensive chronic kidney disease with stage 1 through stage 4 chronic kidney disease, or unspecified chronic kidney disease: Secondary | ICD-10-CM | POA: Diagnosis not present

## 2023-10-02 DIAGNOSIS — E1122 Type 2 diabetes mellitus with diabetic chronic kidney disease: Secondary | ICD-10-CM | POA: Diagnosis not present

## 2023-10-02 DIAGNOSIS — E1151 Type 2 diabetes mellitus with diabetic peripheral angiopathy without gangrene: Secondary | ICD-10-CM | POA: Diagnosis not present

## 2023-10-02 DIAGNOSIS — E785 Hyperlipidemia, unspecified: Secondary | ICD-10-CM | POA: Diagnosis not present

## 2023-10-02 DIAGNOSIS — E1140 Type 2 diabetes mellitus with diabetic neuropathy, unspecified: Secondary | ICD-10-CM | POA: Diagnosis not present

## 2023-10-02 DIAGNOSIS — F419 Anxiety disorder, unspecified: Secondary | ICD-10-CM | POA: Diagnosis not present

## 2023-10-06 DIAGNOSIS — F419 Anxiety disorder, unspecified: Secondary | ICD-10-CM | POA: Diagnosis not present

## 2023-10-06 DIAGNOSIS — E785 Hyperlipidemia, unspecified: Secondary | ICD-10-CM | POA: Diagnosis not present

## 2023-10-06 DIAGNOSIS — E1151 Type 2 diabetes mellitus with diabetic peripheral angiopathy without gangrene: Secondary | ICD-10-CM | POA: Diagnosis not present

## 2023-10-06 DIAGNOSIS — E1122 Type 2 diabetes mellitus with diabetic chronic kidney disease: Secondary | ICD-10-CM | POA: Diagnosis not present

## 2023-10-06 DIAGNOSIS — E1140 Type 2 diabetes mellitus with diabetic neuropathy, unspecified: Secondary | ICD-10-CM | POA: Diagnosis not present

## 2023-10-06 DIAGNOSIS — E1169 Type 2 diabetes mellitus with other specified complication: Secondary | ICD-10-CM | POA: Diagnosis not present

## 2023-10-06 DIAGNOSIS — N183 Chronic kidney disease, stage 3 unspecified: Secondary | ICD-10-CM | POA: Diagnosis not present

## 2023-10-06 DIAGNOSIS — I129 Hypertensive chronic kidney disease with stage 1 through stage 4 chronic kidney disease, or unspecified chronic kidney disease: Secondary | ICD-10-CM | POA: Diagnosis not present

## 2023-10-06 DIAGNOSIS — D631 Anemia in chronic kidney disease: Secondary | ICD-10-CM | POA: Diagnosis not present

## 2023-10-09 ENCOUNTER — Telehealth: Payer: Self-pay | Admitting: Family Medicine

## 2023-10-09 NOTE — Telephone Encounter (Signed)
Radovon from OT [facility] called in and stated that he will be going out to see the pt next week on the 10/22 and not this week due to pt having to many other appts if any question u can reach Radovan at [phone]

## 2023-10-11 NOTE — Telephone Encounter (Signed)
Aware, thanks!

## 2023-10-12 DIAGNOSIS — D631 Anemia in chronic kidney disease: Secondary | ICD-10-CM | POA: Diagnosis not present

## 2023-10-12 DIAGNOSIS — N183 Chronic kidney disease, stage 3 unspecified: Secondary | ICD-10-CM | POA: Diagnosis not present

## 2023-10-12 DIAGNOSIS — E785 Hyperlipidemia, unspecified: Secondary | ICD-10-CM | POA: Diagnosis not present

## 2023-10-12 DIAGNOSIS — F419 Anxiety disorder, unspecified: Secondary | ICD-10-CM | POA: Diagnosis not present

## 2023-10-12 DIAGNOSIS — I129 Hypertensive chronic kidney disease with stage 1 through stage 4 chronic kidney disease, or unspecified chronic kidney disease: Secondary | ICD-10-CM | POA: Diagnosis not present

## 2023-10-12 DIAGNOSIS — E1169 Type 2 diabetes mellitus with other specified complication: Secondary | ICD-10-CM | POA: Diagnosis not present

## 2023-10-12 DIAGNOSIS — E1140 Type 2 diabetes mellitus with diabetic neuropathy, unspecified: Secondary | ICD-10-CM | POA: Diagnosis not present

## 2023-10-12 DIAGNOSIS — E1151 Type 2 diabetes mellitus with diabetic peripheral angiopathy without gangrene: Secondary | ICD-10-CM | POA: Diagnosis not present

## 2023-10-12 DIAGNOSIS — E1122 Type 2 diabetes mellitus with diabetic chronic kidney disease: Secondary | ICD-10-CM | POA: Diagnosis not present

## 2023-10-14 DIAGNOSIS — E1169 Type 2 diabetes mellitus with other specified complication: Secondary | ICD-10-CM | POA: Diagnosis not present

## 2023-10-14 DIAGNOSIS — E1122 Type 2 diabetes mellitus with diabetic chronic kidney disease: Secondary | ICD-10-CM | POA: Diagnosis not present

## 2023-10-14 DIAGNOSIS — E1151 Type 2 diabetes mellitus with diabetic peripheral angiopathy without gangrene: Secondary | ICD-10-CM | POA: Diagnosis not present

## 2023-10-14 DIAGNOSIS — D631 Anemia in chronic kidney disease: Secondary | ICD-10-CM | POA: Diagnosis not present

## 2023-10-14 DIAGNOSIS — N183 Chronic kidney disease, stage 3 unspecified: Secondary | ICD-10-CM | POA: Diagnosis not present

## 2023-10-14 DIAGNOSIS — I129 Hypertensive chronic kidney disease with stage 1 through stage 4 chronic kidney disease, or unspecified chronic kidney disease: Secondary | ICD-10-CM | POA: Diagnosis not present

## 2023-10-14 DIAGNOSIS — F419 Anxiety disorder, unspecified: Secondary | ICD-10-CM | POA: Diagnosis not present

## 2023-10-14 DIAGNOSIS — E785 Hyperlipidemia, unspecified: Secondary | ICD-10-CM | POA: Diagnosis not present

## 2023-10-14 DIAGNOSIS — E1140 Type 2 diabetes mellitus with diabetic neuropathy, unspecified: Secondary | ICD-10-CM | POA: Diagnosis not present

## 2023-10-16 ENCOUNTER — Telehealth: Payer: Self-pay | Admitting: Family Medicine

## 2023-10-16 DIAGNOSIS — E1169 Type 2 diabetes mellitus with other specified complication: Secondary | ICD-10-CM | POA: Diagnosis not present

## 2023-10-16 DIAGNOSIS — F419 Anxiety disorder, unspecified: Secondary | ICD-10-CM | POA: Diagnosis not present

## 2023-10-16 DIAGNOSIS — E1122 Type 2 diabetes mellitus with diabetic chronic kidney disease: Secondary | ICD-10-CM | POA: Diagnosis not present

## 2023-10-16 DIAGNOSIS — D631 Anemia in chronic kidney disease: Secondary | ICD-10-CM | POA: Diagnosis not present

## 2023-10-16 DIAGNOSIS — N183 Chronic kidney disease, stage 3 unspecified: Secondary | ICD-10-CM | POA: Diagnosis not present

## 2023-10-16 DIAGNOSIS — I129 Hypertensive chronic kidney disease with stage 1 through stage 4 chronic kidney disease, or unspecified chronic kidney disease: Secondary | ICD-10-CM | POA: Diagnosis not present

## 2023-10-16 DIAGNOSIS — E785 Hyperlipidemia, unspecified: Secondary | ICD-10-CM | POA: Diagnosis not present

## 2023-10-16 DIAGNOSIS — E1151 Type 2 diabetes mellitus with diabetic peripheral angiopathy without gangrene: Secondary | ICD-10-CM | POA: Diagnosis not present

## 2023-10-16 DIAGNOSIS — E1140 Type 2 diabetes mellitus with diabetic neuropathy, unspecified: Secondary | ICD-10-CM | POA: Diagnosis not present

## 2023-10-16 NOTE — Telephone Encounter (Signed)
Thanks for letting me know, that is fine

## 2023-10-16 NOTE — Telephone Encounter (Signed)
Radvan called from [facility] and stated that theres no need for O/T and pt will continue P/T

## 2023-10-16 NOTE — Telephone Encounter (Signed)
FYI to PCP

## 2023-10-20 DIAGNOSIS — E1151 Type 2 diabetes mellitus with diabetic peripheral angiopathy without gangrene: Secondary | ICD-10-CM | POA: Diagnosis not present

## 2023-10-20 DIAGNOSIS — D631 Anemia in chronic kidney disease: Secondary | ICD-10-CM | POA: Diagnosis not present

## 2023-10-20 DIAGNOSIS — I129 Hypertensive chronic kidney disease with stage 1 through stage 4 chronic kidney disease, or unspecified chronic kidney disease: Secondary | ICD-10-CM | POA: Diagnosis not present

## 2023-10-20 DIAGNOSIS — E1140 Type 2 diabetes mellitus with diabetic neuropathy, unspecified: Secondary | ICD-10-CM | POA: Diagnosis not present

## 2023-10-20 DIAGNOSIS — F419 Anxiety disorder, unspecified: Secondary | ICD-10-CM | POA: Diagnosis not present

## 2023-10-20 DIAGNOSIS — E785 Hyperlipidemia, unspecified: Secondary | ICD-10-CM | POA: Diagnosis not present

## 2023-10-20 DIAGNOSIS — F439 Reaction to severe stress, unspecified: Secondary | ICD-10-CM

## 2023-10-20 DIAGNOSIS — E1122 Type 2 diabetes mellitus with diabetic chronic kidney disease: Secondary | ICD-10-CM | POA: Diagnosis not present

## 2023-10-20 DIAGNOSIS — I2510 Atherosclerotic heart disease of native coronary artery without angina pectoris: Secondary | ICD-10-CM

## 2023-10-20 DIAGNOSIS — K5900 Constipation, unspecified: Secondary | ICD-10-CM

## 2023-10-20 DIAGNOSIS — E1169 Type 2 diabetes mellitus with other specified complication: Secondary | ICD-10-CM | POA: Diagnosis not present

## 2023-10-20 DIAGNOSIS — N183 Chronic kidney disease, stage 3 unspecified: Secondary | ICD-10-CM | POA: Diagnosis not present

## 2023-10-21 ENCOUNTER — Ambulatory Visit: Payer: Medicare PPO | Admitting: Family Medicine

## 2023-10-21 ENCOUNTER — Other Ambulatory Visit: Payer: Self-pay | Admitting: Family Medicine

## 2023-10-22 ENCOUNTER — Ambulatory Visit: Payer: Self-pay | Admitting: *Deleted

## 2023-10-22 DIAGNOSIS — E119 Type 2 diabetes mellitus without complications: Secondary | ICD-10-CM | POA: Diagnosis not present

## 2023-10-22 DIAGNOSIS — H4322 Crystalline deposits in vitreous body, left eye: Secondary | ICD-10-CM | POA: Diagnosis not present

## 2023-10-22 DIAGNOSIS — H35373 Puckering of macula, bilateral: Secondary | ICD-10-CM | POA: Diagnosis not present

## 2023-10-22 DIAGNOSIS — Z961 Presence of intraocular lens: Secondary | ICD-10-CM | POA: Diagnosis not present

## 2023-10-22 LAB — HM DIABETES EYE EXAM

## 2023-10-22 NOTE — Patient Instructions (Signed)
Visit Information  Thank you for taking time to visit with me today. Please don't hesitate to contact me if I can be of assistance to you.   Following are the goals we discussed today:   Goals Addressed             This Visit's Progress    care coordination activities       Care Coordination Interventions: Needs assessment completed,patient's daughter continues to confirm providing assistance with patient's(and spouse's) daily care needs Patient's daughter confirms having no community resources needs at this but understands that if his needs change she contact this social worker           If you are experiencing a Mental Health or Behavioral Health Crisis or need someone to talk to, please call 911   Patient verbalizes understanding of instructions and care plan provided today and agrees to view in MyChart. Active MyChart status and patient understanding of how to access instructions and care plan via MyChart confirmed with patient.     No further follow up required: Patient's daughter to contact this Child psychotherapist with any additional community resource needs    [employer] [place]  [facility], [facility] Licensed Clinical Social Geologist, engineering Dial: [phone]

## 2023-10-22 NOTE — Patient Outreach (Signed)
Care Coordination   Follow Up Visit Note   10/22/2023 Name: KARMAN ZIMAN MRN: [id number] DOB: March 02, 1926  ALBON BOGDANSKI is a 89 y.o. year old male who sees Tower, Audrie Gallus, MD for primary care. I spoke with  Rico Ala daughter by phone today.  What matters to the patients health and wellness today?  Patient's daughter continues to confirm that patient has no community resource needs at this time.       Goals Addressed             This Visit's Progress    care coordination activities       Care Coordination Interventions: Needs assessment completed,patient's daughter continues to confirm providing assistance with patient's(and spouse's) daily care needs Patient's daughter confirms having no community resources needs at this but understands that if his needs change she contact this Child psychotherapist           SDOH assessments and interventions completed:  No     Care Coordination Interventions:  Yes, provided  Interventions Today    Flowsheet Row Most Recent Value  Chronic Disease   Chronic disease during today's visit Other  [caregiver stress]  General Interventions   General Interventions Discussed/Reviewed General Interventions Reviewed, Doctor, general practice for [employer] needs to assist pt.  Daughter confirms that pt and spouse are doing well, pt completes his own ADL's, daughter and spouse assist with transp, household chores/ errands as needed-no additional assist. requested at this time]       Follow up plan: No further intervention required.   Encounter Outcome:  Patient Visit Completed

## 2023-10-23 DIAGNOSIS — E1159 Type 2 diabetes mellitus with other circulatory complications: Secondary | ICD-10-CM | POA: Diagnosis not present

## 2023-10-23 DIAGNOSIS — Z794 Long term (current) use of insulin: Secondary | ICD-10-CM | POA: Diagnosis not present

## 2023-10-23 DIAGNOSIS — E1165 Type 2 diabetes mellitus with hyperglycemia: Secondary | ICD-10-CM | POA: Diagnosis not present

## 2023-10-23 DIAGNOSIS — N1832 Chronic kidney disease, stage 3b: Secondary | ICD-10-CM | POA: Diagnosis not present

## 2023-10-23 DIAGNOSIS — E119 Type 2 diabetes mellitus without complications: Secondary | ICD-10-CM | POA: Diagnosis not present

## 2023-10-23 DIAGNOSIS — E1122 Type 2 diabetes mellitus with diabetic chronic kidney disease: Secondary | ICD-10-CM | POA: Diagnosis not present

## 2023-10-26 NOTE — Progress Notes (Unsigned)
10/28/2023 9:33 AM   David Henry 1926-09-15 [id number]  Referring provider: Mosetta Pigeon, MD [address] [place],  [place] [address]  Urological history: 1. Nephrolithiasis -left URS for 3 mm UVJ stone 03/2021   2. Urinary retention -post-op retention after URS 03/2021  3. BPH with LU TS -Tamsulosin 0.4 mg daily  No chief complaint on file.  HPI: David Henry is a 89 y.o. male who presents today for straining with urination.  Previous records reviewed.   I PSS ***  PVR ***    Score:  1-7 Mild 8-19 Moderate 20-35 Severe    PMH: Past Medical History:  Diagnosis Date   Arthritis    Basal cell carcinoma 08/15/2021   right cheek, EDC   CAD (coronary artery disease)    3 stents   Colon polyp    Diabetes mellitus    type II   Dyspnea    with exertion   GERD (gastroesophageal reflux disease)    Hyperlipidemia    Hypertension    Hypothyroidism    Kidney stones    stage III CKD   Neuropathy    Peripheral vascular disease (HCC)    neuropathy in feet    Surgical History: Past Surgical History:  Procedure Laterality Date   APPENDECTOMY  1984   CATARACT EXTRACTION Bilateral 04/2001   CHOLECYSTECTOMY  1989   COLONOSCOPY N/A 2/14   hemorrhoids and tics (after pos ifob card)   CORONARY ANGIOPLASTY  2006   3 stents   CYSTOSCOPY/URETEROSCOPY/HOLMIUM LASER/STENT PLACEMENT Left 03/24/2021   Procedure: CYSTOSCOPY/URETEROSCOPY, RETROGRADE AND /STENT PLACEMENT;  Surgeon: Riki Altes, MD;  Location: [facility] ORS;  Service: Urology;  Laterality: Left;   ESOPHAGOGASTRODUODENOSCOPY N/A 2/14   RETINAL DETACHMENT SURGERY Right 2003   SHOULDER ARTHROSCOPY WITH ROTATOR CUFF REPAIR AND SUBACROMIAL DECOMPRESSION Right 12/30/2017   Procedure: SHOULDER ARTHROSCOPY WITH ROTATOR CUFF REPAIR AND SUBACROMIAL DECOMPRESSION;  Surgeon: Erin Sons, MD;  Location: [facility] ORS;  Service: Orthopedics;  Laterality: Right;   SHOULDER SURGERY  2012   Lt  shoulder repair    Home Medications:  Allergies as of 10/28/2023       Reactions   Adhesive [tape] Dermatitis   Skin is thin so it is tough to take off.  Use paper tape   Azithromycin Nausea Only   REACTION: nausea   Celecoxib Other (See Comments)   Raises blood pressure   Zocor [simvastatin] Other (See Comments)   Muscle pain        Medication List        Accurate as of October 26, 2023  9:33 AM. If you have any questions, ask your nurse or doctor.          acetaminophen 650 MG CR tablet Commonly known as: TYLENOL Take 650 mg by mouth every 8 (eight) hours as needed.   amLODipine 5 MG tablet Commonly known as: NORVASC Take 0.5 tablets (2.5 mg total) by mouth daily.   aspirin 81 MG tablet Take 81 mg by mouth daily.   FeroSul 325 (65 Fe) MG tablet Generic drug: ferrous sulfate TAKE ONE TABLET BY MOUTH ONCE A DAY WITH FOOD.   FreeStyle Libre 3 Reader [place] 1 each by Does not apply route as directed. Use with sensors to monitor glucose continuously   FreeStyle Libre 3 Sensor Misc Place 1 sensor on the skin every 14 days. Use to check glucose continuously   glimepiride 4 MG tablet Commonly known as: AMARYL Take 0.5 tablets (2  mg total) by mouth daily with breakfast.   levothyroxine 75 MCG tablet Commonly known as: SYNTHROID Take 1 tablet (75 mcg total) by mouth daily before breakfast.   pantoprazole 40 MG tablet Commonly known as: PROTONIX TAKE ONE TABLET BY MOUTH ONCE A DAY   PARoxetine 10 MG tablet Commonly known as: PAXIL TAKE ONE TABLET (10 MG TOTAL) BY MOUTH DAILY.   rosuvastatin 5 MG tablet Commonly known as: CRESTOR TAKE ONE TABLET BY MOUTH EVERY OTHER DAY   Semaglutide(0.25 or 0.5MG /DOS) 2 MG/3ML Sopn Inject into the skin.   senna 8.6 MG tablet Commonly known as: SENOKOT Take 1 tablet by mouth 2 (two) times daily.   tamsulosin 0.4 MG Caps capsule Commonly known as: FLOMAX TAKE ONE CAPSULE BY MOUTH ONCE DAILY   torsemide 10 MG  tablet Commonly known as: DEMADEX Take 10 mg by mouth daily.   VITAMIN D PO Take 2,000 Units by mouth daily.        Allergies:  Allergies  Allergen Reactions   Adhesive [Tape] Dermatitis    Skin is thin so it is tough to take off.  Use paper tape   Azithromycin Nausea Only    REACTION: nausea   Celecoxib Other (See Comments)    Raises blood pressure   Zocor [Simvastatin] Other (See Comments)    Muscle pain    Family History: Family History  Problem Relation Age of Onset   Cancer Brother        prostate CA   Heart disease Mother    Stroke Father    Kidney disease Sister    Kidney disease Sister    Clotting disorder Sister    COPD Brother    Heart disease Brother     Social History:  reports that he has never smoked. He has never used smokeless tobacco. He reports that he does not drink alcohol and does not use drugs.  ROS: Pertinent ROS in HPI  Physical Exam: There were no vitals taken for this visit.  Constitutional:  Well nourished. Alert and oriented, No acute distress. HEENT: [place] AT, moist mucus membranes.  Trachea midline, no masses. Cardiovascular: No clubbing, cyanosis, or edema. Respiratory: Normal respiratory effort, no increased work of breathing. GI: Abdomen is soft, non tender, non distended, no abdominal masses. Liver and spleen not palpable.  No hernias appreciated.  Stool sample for occult testing is not indicated.   GU: No CVA tenderness.  No bladder fullness or masses.  Patient with circumcised/uncircumcised phallus. ***Foreskin easily retracted***  Urethral meatus is patent.  No penile discharge. No penile lesions or rashes. Scrotum without lesions, cysts, rashes and/or edema.  Testicles are located scrotally bilaterally. No masses are appreciated in the testicles. Left and right epididymis are normal. Rectal: Patient with  normal sphincter tone. Anus and perineum without scarring or rashes. No rectal masses are appreciated. Prostate is approximately  *** grams, *** nodules are appreciated. Seminal vesicles are normal. Skin: No rashes, bruises or suspicious lesions. Lymph: No cervical or inguinal adenopathy. Neurologic: Grossly intact, no focal deficits, moving all 4 extremities. Psychiatric: Normal mood and affect.  Laboratory Data: Lab Results  Component Value Date   WBC 8.3 09/23/2023   HGB 12.7 (L) 09/23/2023   HCT 38.7 (L) 09/23/2023   MCV 104.0 (H) 09/23/2023   PLT 245.0 09/23/2023   Lab Results  Component Value Date   CREATININE 1.82 (H) 09/23/2023   Lab Results  Component Value Date   PSA 2.09 05/13/2011   PSA 1.83 08/18/2005  Lab Results  Component Value Date   HGBA1C 7.5 (A) 06/16/2023    Lab Results  Component Value Date   TSH 1.69 09/23/2023      Component Value Date/Time   CHOL 122 03/09/2023 0846   HDL 45.80 03/09/2023 0846   CHOLHDL 3 03/09/2023 0846   VLDL 17.6 03/09/2023 0846   LDLCALC 59 03/09/2023 0846   Lab Results  Component Value Date   AST 21 09/23/2023   Lab Results  Component Value Date   ALT 32 09/23/2023   Urinalysis POCT Urinalysis Dipstick (Automated) Order: [id number] Status: Final result     Visible to patient: Yes (seen)     Next appt: 10/28/2023 at 03:00 PM in Urology [facility]-C)     Dx: Frequent urination   4 Result Notes     1 Patient Communication       Component Ref Range & Units 1 mo ago (09/23/23) 5 yr ago (07/29/18) 10 yr ago (06/23/13) 10 yr ago (05/24/13)  Color, UA Light Yellow   Yellow  Clarity, UA Clear   Clear  Glucose, UA Negative Positive Abnormal    Neg. R  Comment: 500 mg/dL  Bilirubin, UA Negative   Small  Ketones, UA Negative   Neg.  Spec Grav, UA 1.010 - 1.025 1.020   1.015 R  Blood, UA Negative   Moderate  pH, UA 5.0 - 8.0 6.0   6.0 R  Protein, UA Negative Negative   Trace R  Urobilinogen, UA 0.2 or 1.0 E.U./dL 0.2  0.2 R 0.2 R  Nitrite, UA Negative   Neg.  Leukocytes, UA Negative Negative NEGATIVE R NEGATIVE Negative R   Resulting Agency  [facility]         Specimen Collected: 09/23/23 12:48 Last Resulted: 09/23/23 12:48    I have reviewed the labs.   Pertinent Imaging: N/A  Assessment & Plan:  ***  1. BPH with LU TS -PSA stable *** -DRE benign *** -UA benign *** -PVR < 300 cc *** -symptoms - *** -most bothersome symptoms are *** -continue conservative management, avoiding bladder irritants and timed voiding's -Initiate alpha-blocker (***), discussed side effects *** -Initiate 5 alpha reductase inhibitor (***), discussed side effects *** -Continue tamsulosin 0.4 mg daily, alfuzosin 10 mg daily, Rapaflo 8 mg daily, terazosin, doxazosin, Cialis 5 mg daily and finasteride 5 mg daily, dutasteride 0.5 mg daily***:refills given -Cannot tolerate medication or medication failure, schedule cystoscopy ***  2.  Nephrolithiasis -No symptoms of renal colic or episodes of gross hematuria   No follow-ups on file.  These notes generated with voice recognition software. I apologize for typographical errors.  Cloretta Ned  [facility] [address]  [address] [place], [place] [address] [phone]

## 2023-10-28 ENCOUNTER — Encounter: Payer: Self-pay | Admitting: Urology

## 2023-10-28 ENCOUNTER — Ambulatory Visit: Payer: Medicare PPO | Admitting: Urology

## 2023-10-28 VITALS — BP 124/75 | HR 93 | Ht 66.5 in | Wt 130.0 lb

## 2023-10-28 DIAGNOSIS — N401 Enlarged prostate with lower urinary tract symptoms: Secondary | ICD-10-CM | POA: Diagnosis not present

## 2023-10-28 DIAGNOSIS — E119 Type 2 diabetes mellitus without complications: Secondary | ICD-10-CM | POA: Diagnosis not present

## 2023-10-28 DIAGNOSIS — N138 Other obstructive and reflux uropathy: Secondary | ICD-10-CM

## 2023-10-28 DIAGNOSIS — N200 Calculus of kidney: Secondary | ICD-10-CM

## 2023-10-28 DIAGNOSIS — Z87442 Personal history of urinary calculi: Secondary | ICD-10-CM | POA: Diagnosis not present

## 2023-10-28 LAB — BLADDER SCAN AMB NON-IMAGING: PVR: 144 WU

## 2023-10-28 MED ORDER — FINASTERIDE 5 MG PO TABS
5.0000 mg | ORAL_TABLET | Freq: Every day | ORAL | 3 refills | Status: DC
Start: 2023-10-28 — End: 2024-07-27

## 2023-10-29 DIAGNOSIS — E1122 Type 2 diabetes mellitus with diabetic chronic kidney disease: Secondary | ICD-10-CM | POA: Diagnosis not present

## 2023-10-29 DIAGNOSIS — N183 Chronic kidney disease, stage 3 unspecified: Secondary | ICD-10-CM | POA: Diagnosis not present

## 2023-10-29 DIAGNOSIS — E1140 Type 2 diabetes mellitus with diabetic neuropathy, unspecified: Secondary | ICD-10-CM | POA: Diagnosis not present

## 2023-10-29 DIAGNOSIS — E1151 Type 2 diabetes mellitus with diabetic peripheral angiopathy without gangrene: Secondary | ICD-10-CM | POA: Diagnosis not present

## 2023-10-29 DIAGNOSIS — D631 Anemia in chronic kidney disease: Secondary | ICD-10-CM | POA: Diagnosis not present

## 2023-10-29 DIAGNOSIS — E785 Hyperlipidemia, unspecified: Secondary | ICD-10-CM | POA: Diagnosis not present

## 2023-10-29 DIAGNOSIS — I129 Hypertensive chronic kidney disease with stage 1 through stage 4 chronic kidney disease, or unspecified chronic kidney disease: Secondary | ICD-10-CM | POA: Diagnosis not present

## 2023-10-29 DIAGNOSIS — E1169 Type 2 diabetes mellitus with other specified complication: Secondary | ICD-10-CM | POA: Diagnosis not present

## 2023-10-29 DIAGNOSIS — F419 Anxiety disorder, unspecified: Secondary | ICD-10-CM | POA: Diagnosis not present

## 2023-11-07 ENCOUNTER — Other Ambulatory Visit: Payer: Self-pay | Admitting: Family Medicine

## 2023-11-24 DIAGNOSIS — E1165 Type 2 diabetes mellitus with hyperglycemia: Secondary | ICD-10-CM | POA: Diagnosis not present

## 2023-11-24 DIAGNOSIS — N1832 Chronic kidney disease, stage 3b: Secondary | ICD-10-CM | POA: Diagnosis not present

## 2023-11-24 DIAGNOSIS — Z794 Long term (current) use of insulin: Secondary | ICD-10-CM | POA: Diagnosis not present

## 2023-11-24 DIAGNOSIS — E119 Type 2 diabetes mellitus without complications: Secondary | ICD-10-CM | POA: Diagnosis not present

## 2023-11-24 DIAGNOSIS — E1122 Type 2 diabetes mellitus with diabetic chronic kidney disease: Secondary | ICD-10-CM | POA: Diagnosis not present

## 2023-11-24 DIAGNOSIS — E1159 Type 2 diabetes mellitus with other circulatory complications: Secondary | ICD-10-CM | POA: Diagnosis not present

## 2023-11-27 DIAGNOSIS — E119 Type 2 diabetes mellitus without complications: Secondary | ICD-10-CM | POA: Diagnosis not present

## 2023-12-25 ENCOUNTER — Telehealth: Payer: Self-pay | Admitting: *Deleted

## 2023-12-25 ENCOUNTER — Other Ambulatory Visit: Payer: Self-pay | Admitting: Urology

## 2023-12-25 NOTE — Telephone Encounter (Signed)
 Copied from CRM [id number]. Topic: Clinical - Prescription Issue >> Dec 25, 2023  3:34 PM Isabell A wrote: Reason for CRM: Rosina from [facility], calling to confirm the directions for tamsulosin  (FLOMAX ) 0.4 MG CAPS capsule - wants to confirm if it is once daily.   Callback number: [phone]

## 2023-12-25 NOTE — Telephone Encounter (Signed)
 Pt's urologist fills this med not [place]. Left VM letting [place] know they need to f/u with urologist office

## 2023-12-29 ENCOUNTER — Other Ambulatory Visit: Payer: Self-pay | Admitting: Family Medicine

## 2024-01-26 NOTE — Progress Notes (Signed)
 01/28/2024 2:59 PM   David Henry 02/24/1926 [id number]  Referring provider: Randeen Henry LABOR, MD [address] [place],  [place] [address]  Urological history: 1. Nephrolithiasis -left URS for 3 mm UVJ stone 03/2021   2. Urinary retention -post-op retention after URS 03/2021  3. BPH with LU TS -Tamsulosin  0.4 mg twice daily   Chief Complaint  Patient presents with   Follow-up    follow-up   HPI: David Henry is a 89 y.o. male who presents today for 61-month follow-up after starting finasteride  with his daughter, [place] Henry.    Previous records reviewed.   I PSS 7/3  PVR 0 mL   He feels that his urinary symptoms have improved since taking the finasteride  5 mg daily and the tamsulosin  0.4 mg twice daily.  Patient denies any modifying or aggravating factors.  Patient denies any recent UTI's, gross hematuria, dysuria or suprapubic/flank pain.  Patient denies any fevers, chills, nausea or vomiting.     IPSS     Row Name 01/28/24 1400         International Prostate Symptom Score   How often have you had the sensation of not emptying your bladder? Not at All     How often have you had to urinate less than every two hours? Not at All     How often have you found you stopped and started again several times when you urinated? Not at All     How often have you found it difficult to postpone urination? Less than 1 in 5 times     How often have you had a weak urinary stream? Almost always     How often have you had to strain to start urination? Not at All     How many times did you typically get up at night to urinate? 1 Time     Total IPSS Score 7       Quality of Life due to urinary symptoms   If you were to spend the rest of your life with your urinary condition just the way it is now how would you feel about that? Mixed               Score:  1-7 Mild 8-19 Moderate 20-35 Severe    PMH: Past Medical History:  Diagnosis Date   Arthritis     Basal cell carcinoma 08/15/2021   right cheek, EDC   CAD (coronary artery disease)    3 stents   Colon polyp    Diabetes mellitus    type II   Dyspnea    with exertion   GERD (gastroesophageal reflux disease)    Hyperlipidemia    Hypertension    Hypothyroidism    Kidney stones    stage III CKD   Neuropathy    Peripheral vascular disease (HCC)    neuropathy in feet    Surgical History: Past Surgical History:  Procedure Laterality Date   APPENDECTOMY  1984   CATARACT EXTRACTION Bilateral 04/2001   CHOLECYSTECTOMY  1989   COLONOSCOPY N/A 2/14   hemorrhoids and tics (after pos ifob card)   CORONARY ANGIOPLASTY  2006   3 stents   CYSTOSCOPY/URETEROSCOPY/HOLMIUM LASER/STENT PLACEMENT Left 03/24/2021   Procedure: CYSTOSCOPY/URETEROSCOPY, RETROGRADE AND /STENT PLACEMENT;  Surgeon: David Glendia BROCKS, MD;  Location: [facility] OR[facility];  Service: Urology;  Laterality: Left;   ESOPHAGOGASTRODUODENOSCOPY N/A 2/14   RETINAL DETACHMENT SURGERY Right 2003   SHOULDER ARTHROSCOPY WITH ROTATOR  CUFF REPAIR AND SUBACROMIAL DECOMPRESSION Right 12/30/2017   Procedure: SHOULDER ARTHROSCOPY WITH ROTATOR CUFF REPAIR AND SUBACROMIAL DECOMPRESSION;  Surgeon: David Barters, MD;  Location: [facility] OR[facility];  Service: Orthopedics;  Laterality: Right;   SHOULDER SURGERY  2012   Lt shoulder repair    Home Medications:  Allergies as of 01/28/2024       Reactions   Adhesive [tape] Dermatitis   Skin is thin so it is tough to take off.  Use paper tape   Azithromycin Nausea Only   REACTION: nausea   Celecoxib Other (See Comments)   Raises blood pressure   Zocor  [simvastatin ] Other (See Comments)   Muscle pain        Medication List        Accurate as of January 28, 2024  2:59 PM. If you have any questions, ask your nurse or doctor.          acetaminophen  650 MG CR tablet Commonly known as: TYLENOL  Take 650 mg by mouth every 8 (eight) hours as needed.   amLODipine  5 MG tablet Commonly known as:  NORVASC  Take 0.5 tablets (2.5 mg total) by mouth daily.   aspirin  81 MG tablet Take 81 mg by mouth daily.   FeroSul 325 (65 Fe) MG tablet Generic drug: ferrous sulfate  TAKE ONE TABLET BY MOUTH ONCE A DAY WITH FOOD.   finasteride  5 MG tablet Commonly known as: PROSCAR  Take 1 tablet (5 mg total) by mouth daily.   FreeStyle Libre 3 Reader [place] 1 each by Does not apply route as directed. Use with sensors to monitor glucose continuously   FreeStyle Libre 3 Sensor Misc Place 1 sensor on the skin every 14 days. Use to check glucose continuously   glimepiride  4 MG tablet Commonly known as: AMARYL  Take 0.5 tablets (2 mg total) by mouth daily with breakfast.   levothyroxine  75 MCG tablet Commonly known as: SYNTHROID  TAKE ONE TABLET (75 MCG TOTAL) BY MOUTH DAILY BEFORE BREAKFAST.   pantoprazole  40 MG tablet Commonly known as: PROTONIX  TAKE ONE TABLET BY MOUTH ONCE A DAY   PARoxetine  10 MG tablet Commonly known as: PAXIL  TAKE ONE TABLET (10 MG TOTAL) BY MOUTH DAILY.   rosuvastatin  5 MG tablet Commonly known as: CRESTOR  TAKE ONE TABLET BY MOUTH EVERY OTHER DAY   Semaglutide(0.25 or 0.5MG /DOS) 2 MG/3ML Sopn Inject into the skin.   senna 8.6 MG tablet Commonly known as: SENOKOT Take 1 tablet by mouth 2 (two) times daily.   tamsulosin  0.4 MG Caps capsule Commonly known as: FLOMAX  Take 2 capsules (0.8 mg total) by mouth daily. What changed: how much to take Changed by: David Henry   torsemide 10 MG tablet Commonly known as: DEMADEX Take 10 mg by mouth daily.   VITAMIN D  PO Take 2,000 Units by mouth daily.        Allergies:  Allergies  Allergen Reactions   Adhesive [Tape] Dermatitis    Skin is thin so it is tough to take off.  Use paper tape   Azithromycin Nausea Only    REACTION: nausea   Celecoxib Other (See Comments)    Raises blood pressure   Zocor  [Simvastatin ] Other (See Comments)    Muscle pain    Family History: Family History  Problem Relation  Age of Onset   Cancer Brother        prostate CA   Heart disease Mother    Stroke Father    Kidney disease Sister    Kidney disease Sister  Clotting disorder Sister    COPD Brother    Heart disease Brother     Social History:  reports that he has never smoked. He has never been exposed to tobacco smoke. He has never used smokeless tobacco. He reports that he does not drink alcohol and does not use drugs.  ROS: Pertinent ROS in HPI  Physical Exam: BP (!) 151/76   Pulse 67   Ht 5' 6 (1.676 m)   Wt 139 lb (63 kg)   BMI 22.44 kg/m   Constitutional:  Well nourished. Alert and oriented, No acute distress. HEENT: [place] AT, moist mucus membranes.  Trachea midline, no masses. Cardiovascular: No clubbing, cyanosis, or edema. Respiratory: Normal respiratory effort, no increased work of breathing. Neurologic: Grossly intact, no focal deficits, moving all 4 extremities. Psychiatric: Normal mood and affect.   Laboratory Data: N/A  Pertinent imaging:  01/28/24 14:40  Scan Result 0ml    Assessment & Plan:    1. BPH with LU TS --PVR < 300 cc  -most bothersome symptoms are urge incontinence and feeling of incomplete bladder emptying -continue conservative management, avoiding bladder irritants and timed voiding's -continue finasteride  5 mg -Continue tamsulosin  0.4 mg twice daily -continue timed voids while awake   2.  Nephrolithiasis -No symptoms of renal colic or episodes of gross hematuria   Return in 6 months (on 07/27/2024) for Follow-up in 6 months for IPSS, PVR.  These notes generated with voice recognition software. I apologize for typographical errors.  CLOTILDA HELON RIGGERS  [facility] [address]  [address] [place], [place] [address] [phone]

## 2024-01-28 ENCOUNTER — Encounter: Payer: Self-pay | Admitting: Urology

## 2024-01-28 ENCOUNTER — Ambulatory Visit: Payer: Self-pay | Admitting: Urology

## 2024-01-28 VITALS — BP 151/76 | HR 67 | Ht 66.0 in | Wt 139.0 lb

## 2024-01-28 DIAGNOSIS — N401 Enlarged prostate with lower urinary tract symptoms: Secondary | ICD-10-CM

## 2024-01-28 DIAGNOSIS — N1832 Chronic kidney disease, stage 3b: Secondary | ICD-10-CM | POA: Diagnosis not present

## 2024-01-28 DIAGNOSIS — Z794 Long term (current) use of insulin: Secondary | ICD-10-CM | POA: Diagnosis not present

## 2024-01-28 DIAGNOSIS — N200 Calculus of kidney: Secondary | ICD-10-CM

## 2024-01-28 DIAGNOSIS — N138 Other obstructive and reflux uropathy: Secondary | ICD-10-CM

## 2024-01-28 DIAGNOSIS — E1122 Type 2 diabetes mellitus with diabetic chronic kidney disease: Secondary | ICD-10-CM | POA: Diagnosis not present

## 2024-01-28 DIAGNOSIS — E11649 Type 2 diabetes mellitus with hypoglycemia without coma: Secondary | ICD-10-CM | POA: Diagnosis not present

## 2024-01-28 DIAGNOSIS — E1159 Type 2 diabetes mellitus with other circulatory complications: Secondary | ICD-10-CM | POA: Diagnosis not present

## 2024-01-28 LAB — HEMOGLOBIN A1C: Hemoglobin A1C: 8.1

## 2024-01-28 LAB — BLADDER SCAN AMB NON-IMAGING

## 2024-01-28 MED ORDER — TAMSULOSIN HCL 0.4 MG PO CAPS: 0.8000 mg | ORAL_CAPSULE | Freq: Every day | ORAL | 3 refills | Status: DC

## 2024-02-16 DIAGNOSIS — I2510 Atherosclerotic heart disease of native coronary artery without angina pectoris: Secondary | ICD-10-CM | POA: Diagnosis not present

## 2024-02-16 DIAGNOSIS — I340 Nonrheumatic mitral (valve) insufficiency: Secondary | ICD-10-CM | POA: Diagnosis not present

## 2024-02-16 DIAGNOSIS — I10 Essential (primary) hypertension: Secondary | ICD-10-CM | POA: Diagnosis not present

## 2024-02-16 DIAGNOSIS — I77810 Thoracic aortic ectasia: Secondary | ICD-10-CM | POA: Diagnosis not present

## 2024-02-16 DIAGNOSIS — Z794 Long term (current) use of insulin: Secondary | ICD-10-CM | POA: Diagnosis not present

## 2024-02-16 DIAGNOSIS — E782 Mixed hyperlipidemia: Secondary | ICD-10-CM | POA: Diagnosis not present

## 2024-02-16 DIAGNOSIS — E119 Type 2 diabetes mellitus without complications: Secondary | ICD-10-CM | POA: Diagnosis not present

## 2024-02-16 DIAGNOSIS — R600 Localized edema: Secondary | ICD-10-CM | POA: Diagnosis not present

## 2024-02-25 DIAGNOSIS — E119 Type 2 diabetes mellitus without complications: Secondary | ICD-10-CM | POA: Diagnosis not present

## 2024-03-06 ENCOUNTER — Telehealth: Payer: Self-pay | Admitting: Family Medicine

## 2024-03-06 DIAGNOSIS — I10 Essential (primary) hypertension: Secondary | ICD-10-CM

## 2024-03-06 DIAGNOSIS — E1169 Type 2 diabetes mellitus with other specified complication: Secondary | ICD-10-CM

## 2024-03-06 DIAGNOSIS — N1832 Chronic kidney disease, stage 3b: Secondary | ICD-10-CM

## 2024-03-06 DIAGNOSIS — E039 Hypothyroidism, unspecified: Secondary | ICD-10-CM

## 2024-03-06 NOTE — Telephone Encounter (Signed)
-----   Message from Alvina Chou sent at 02/15/2024  2:55 PM EST ----- Regarding: Lab orders for TUE, 3.18.25 Patient is scheduled for CPX labs, please order future labs, Thanks , Camelia Eng

## 2024-03-10 ENCOUNTER — Other Ambulatory Visit: Payer: Medicare PPO

## 2024-03-10 DIAGNOSIS — E039 Hypothyroidism, unspecified: Secondary | ICD-10-CM | POA: Diagnosis not present

## 2024-03-10 DIAGNOSIS — E785 Hyperlipidemia, unspecified: Secondary | ICD-10-CM

## 2024-03-10 DIAGNOSIS — E1169 Type 2 diabetes mellitus with other specified complication: Secondary | ICD-10-CM

## 2024-03-10 DIAGNOSIS — I10 Essential (primary) hypertension: Secondary | ICD-10-CM

## 2024-03-10 LAB — CBC WITH DIFFERENTIAL/PLATELET
Basophils Absolute: 0.1 10*3/uL (ref 0.0–0.1)
Basophils Relative: 0.8 % (ref 0.0–3.0)
Eosinophils Absolute: 0.2 10*3/uL (ref 0.0–0.7)
Eosinophils Relative: 2 % (ref 0.0–5.0)
HCT: 34.7 % — ABNORMAL LOW (ref 39.0–52.0)
Hemoglobin: 11.6 g/dL — ABNORMAL LOW (ref 13.0–17.0)
Lymphocytes Relative: 21 % (ref 12.0–46.0)
Lymphs Abs: 1.7 10*3/uL (ref 0.7–4.0)
MCHC: 33.3 g/dL (ref 30.0–36.0)
MCV: 105.5 fl — ABNORMAL HIGH (ref 78.0–100.0)
Monocytes Absolute: 0.6 10*3/uL (ref 0.1–1.0)
Monocytes Relative: 7.9 % (ref 3.0–12.0)
Neutro Abs: 5.4 10*3/uL (ref 1.4–7.7)
Neutrophils Relative %: 68.3 % (ref 43.0–77.0)
Platelets: 223 10*3/uL (ref 150.0–400.0)
RBC: 3.29 Mil/uL — ABNORMAL LOW (ref 4.22–5.81)
RDW: 14.9 % (ref 11.5–15.5)
WBC: 8 10*3/uL (ref 4.0–10.5)

## 2024-03-10 LAB — LIPID PANEL
Cholesterol: 107 mg/dL (ref 0–200)
HDL: 46.6 mg/dL (ref >39.00)
LDL Cholesterol: 40 mg/dL (ref 0–99)
NonHDL: 60.32
Total CHOL/HDL Ratio: 2
Triglycerides: 100 mg/dL (ref 0.0–149.0)
VLDL: 20 mg/dL (ref 0.0–40.0)

## 2024-03-10 LAB — TSH: TSH: 1.98 u[IU]/mL (ref 0.35–5.50)

## 2024-03-10 LAB — COMPREHENSIVE METABOLIC PANEL
ALT: 51 U/L (ref 0–53)
AST: 27 U/L (ref 0–37)
Albumin: 3.9 g/dL (ref 3.5–5.2)
Alkaline Phosphatase: 88 U/L (ref 39–117)
BUN: 22 mg/dL (ref 6–23)
CO2: 29 meq/L (ref 19–32)
Calcium: 9.3 mg/dL (ref 8.4–10.5)
Chloride: 107 meq/L (ref 96–112)
Creatinine, Ser: 1.37 mg/dL (ref 0.40–1.50)
GFR: 43.79 mL/min — ABNORMAL LOW (ref >60.00)
Glucose, Bld: 189 mg/dL — ABNORMAL HIGH (ref 70–99)
Potassium: 4 meq/L (ref 3.5–5.1)
Sodium: 143 meq/L (ref 135–145)
Total Bilirubin: 1 mg/dL (ref 0.2–1.2)
Total Protein: 5.9 g/dL — ABNORMAL LOW (ref 6.0–8.3)

## 2024-03-17 ENCOUNTER — Encounter: Payer: Self-pay | Admitting: Family Medicine

## 2024-03-17 ENCOUNTER — Ambulatory Visit (INDEPENDENT_AMBULATORY_CARE_PROVIDER_SITE_OTHER): Payer: Medicare PPO | Admitting: Family Medicine

## 2024-03-17 VITALS — BP 110/68 | HR 85 | Temp 98.5°F | Ht 66.5 in | Wt 140.2 lb

## 2024-03-17 DIAGNOSIS — E785 Hyperlipidemia, unspecified: Secondary | ICD-10-CM | POA: Diagnosis not present

## 2024-03-17 DIAGNOSIS — E039 Hypothyroidism, unspecified: Secondary | ICD-10-CM

## 2024-03-17 DIAGNOSIS — E1169 Type 2 diabetes mellitus with other specified complication: Secondary | ICD-10-CM | POA: Diagnosis not present

## 2024-03-17 DIAGNOSIS — N183 Chronic kidney disease, stage 3 unspecified: Secondary | ICD-10-CM

## 2024-03-17 DIAGNOSIS — Z0000 Encounter for general adult medical examination without abnormal findings: Secondary | ICD-10-CM

## 2024-03-17 DIAGNOSIS — K219 Gastro-esophageal reflux disease without esophagitis: Secondary | ICD-10-CM | POA: Diagnosis not present

## 2024-03-17 DIAGNOSIS — N1832 Chronic kidney disease, stage 3b: Secondary | ICD-10-CM | POA: Diagnosis not present

## 2024-03-17 DIAGNOSIS — I10 Essential (primary) hypertension: Secondary | ICD-10-CM

## 2024-03-17 DIAGNOSIS — N401 Enlarged prostate with lower urinary tract symptoms: Secondary | ICD-10-CM | POA: Diagnosis not present

## 2024-03-17 DIAGNOSIS — Z79899 Other long term (current) drug therapy: Secondary | ICD-10-CM | POA: Insufficient documentation

## 2024-03-17 DIAGNOSIS — E1122 Type 2 diabetes mellitus with diabetic chronic kidney disease: Secondary | ICD-10-CM

## 2024-03-17 DIAGNOSIS — Z794 Long term (current) use of insulin: Secondary | ICD-10-CM

## 2024-03-17 DIAGNOSIS — N400 Enlarged prostate without lower urinary tract symptoms: Secondary | ICD-10-CM | POA: Insufficient documentation

## 2024-03-17 NOTE — Assessment & Plan Note (Signed)
 GFR 43.7 Under care of nephrology   Blood pressure controlled with amlodipine  Unsure if he is taking his torsemide- encouraged to check with prescriber re: what he should be taking  Does get ankle swelling

## 2024-03-17 NOTE — Assessment & Plan Note (Signed)
 Continues protonix 40 mg daily  This controls symptoms well Has been unable to stop it  May have some dm gastroparesis

## 2024-03-17 NOTE — Assessment & Plan Note (Signed)
 bp in fair control at this time  BP Readings from Last 1 Encounters:  03/17/24 110/68   No changes needed Amlodipine 2.5 mg daily  Unsure if he is taking the torsemide and will check with his nephrologist

## 2024-03-17 NOTE — Assessment & Plan Note (Signed)
 Continues finasteride 5 mg daily and tamsulosisn 0.8 mg from urology to help voiding symptoms

## 2024-03-17 NOTE — Assessment & Plan Note (Signed)
 Disc goals for lipids and reasons to control them Rev last labs with pt Rev low sat fat diet in detail   Plan continue crestor 5 mg every other day  LDl of 40

## 2024-03-17 NOTE — Assessment & Plan Note (Signed)
 Lab Results  Component Value Date   HGBA1C 8.1 01/28/2024   HGBA1C 7.5 (A) 06/16/2023   HGBA1C 7.8 (H) 03/09/2023   Under care of endocrinology  Some low readings Taking semaglutide Strongly encouraged to eat more regular protein for this and also to prevent muscle loss

## 2024-03-17 NOTE — Assessment & Plan Note (Signed)
 Lab Results  Component Value Date   TSH 1.98 03/10/2024    Levothyroxine 75 mcg  Fairly clinically stable Weight is stable now

## 2024-03-17 NOTE — Patient Instructions (Addendum)
 Make sure you take 2000 international units of vitamin D3 over the counter daily  This is for bone health  Stay active   To prevent muscle loss  Add some strength training to your routine, this is important for bone and brain health and can reduce your risk of falls and help your body use insulin properly and regulate weight  Light weights, exercise bands , and internet videos are a good way to start  Yoga (chair or regular), machines , floor exercises or a gym with machines are also good options   To build muscle- protein in diet is very important It also keeps blood sugar from falling  The following are examples of protein in diet  Protein drinks are great  Meat  Fish  Eggs  Dairy products  Soy products  Oat milk  Almond milk Nuts and nut butters  Dried beans    Ask the kidney doctor about torsemide- should you be taking it   If vision is not optimal/ I don't think you should drive  Rely on [name] and if needed I can look into some ride services   If you don't feel steady - it's best to use a walker

## 2024-03-17 NOTE — Assessment & Plan Note (Signed)
 Reviewed health habits including diet and exercise and skin cancer prevention Reviewed appropriate screening tests for age  Also reviewed health mt list, fam hx and immunization status , as well as social and family history   See HPI Labs reviewed and ordered Health Maintenance  Topic Date Due   Complete foot exam   10/21/2023   Zoster (Shingles) Vaccine (1 of 2) 06/17/2025*   COVID-19 Vaccine (4 - 2024-25 season) 10/08/2025*   Hemoglobin A1C  07/27/2024   Medicare Annual Wellness Visit  08/20/2024   Eye exam for diabetics  10/21/2024   DTaP/Tdap/Td vaccine (5 - Td or Tdap) 07/29/2028   Pneumonia Vaccine  Completed   Flu Shot  Completed   HPV Vaccine  Aged Out  *Topic was postponed. The date shown is not the original due date.   Out aged colon screening  Discussed fall prevention, supplements and exercise for bone density  Encouraged protein intake nad strength building exercise to retain muscle PHQ 0

## 2024-03-17 NOTE — Progress Notes (Signed)
 Subjective:    Patient ID: David Henry, male    DOB: 1926/06/08, 89 y.o.   MRN: [id number]  HPI  Here for health maintenance exam and to review chronic medical problems   Wt Readings from Last 3 Encounters:  03/17/24 140 lb 4 oz (63.6 kg)  01/28/24 139 lb (63 kg)  10/28/23 130 lb (59 kg)   22.30 kg/m  Vitals:   03/17/24 0902  BP: 110/68  Pulse: 85  Temp: 98.5 F (36.9 C)  SpO2: 99%    Immunization History  Administered Date(s) Administered   Fluad Quad(high Dose 65+) 09/02/2019, 10/11/2019   Fluad Trivalent(High Dose 65+) 09/23/2023   H1N1 12/28/2008   Influenza Split 10/23/2011, 09/15/2012   Influenza Whole 09/22/1999, 10/01/2007, 10/02/2008, 09/27/2009, 10/09/2010   Influenza, Quadrivalent, Recombinant, Inj, Pf 09/22/2017   Influenza, Seasonal, Injecte, Preservative Fre 09/16/2018   Influenza,inj,Quad PF,6+ Mos 09/13/2014, 09/17/2015, 09/12/2016   Influenza-Unspecified 09/20/2013, 10/01/2020, 09/21/2022   PFIZER(Purple Top)SARS-COV-2 Vaccination 01/17/2020, 01/28/2020, 10/15/2020   Pneumococcal Conjugate-13 12/08/2014   Pneumococcal Polysaccharide-23 07/23/2003   Td 12/04/1999, 03/01/2008   Tdap 05/20/2011, 07/29/2018   Zoster, Live 03/31/2015    Health Maintenance Due  Topic Date Due   FOOT EXAM  10/21/2023   Feels fair    Prostate health, no further screening due to age  Lab Results  Component Value Date   PSA 2.09 05/13/2011   PSA 1.83 08/18/2005  Proscar for BPH from urology as we;; as f;p,ax 0.8     Colon cancer screening -out aged   Bone health   Falls- one fall / coming down the stairs in the dark and feel forward / had skin tear on arms  Is more careful with this now /using light  Fractures= none  Supplements  Last vitamin D Lab Results  Component Value Date   VD25OH 51.10 03/09/2023    Exercise Is active  On feet a lot taking care of wife and keeping house / does everything at this point  Laundry  Lifts some things like  bottled water    Mood    03/17/2024    9:15 AM 08/21/2023    3:16 PM 03/16/2023    8:57 AM 06/13/2022    9:10 AM 03/12/2022    8:58 AM  Depression screen PHQ 2/9  Decreased Interest 0 0 0 0 0  Down, Depressed, Hopeless 0 0 0 0 0  PHQ - 2 Score 0 0 0 0 0  Altered sleeping 1      Tired, decreased energy 2      Change in appetite 2      Feeling bad or failure about yourself  0      Trouble concentrating 0      Moving slowly or fidgety/restless 0      Suicidal thoughts 0      PHQ-9 Score 5      Difficult doing work/chores Not difficult at all       Continues paxil 10 mg daily for anxious mood   HTN bp is stable today  No cp or palpitations or headaches or edema  No side effects to medicines  BP Readings from Last 3 Encounters:  03/17/24 110/68  01/28/24 (!) 151/76  10/28/23 124/75    Amlodipine 2.5 mg daily  Torsemide was previously reduced to 10 mg three days weekly -now not taking any of it  Sees nephrology for CKD  Lab Results  Component Value Date   NA 143 03/10/2024   K 4.0  03/10/2024   CO2 29 03/10/2024   GLUCOSE 189 (H) 03/10/2024   BUN 22 03/10/2024   CREATININE 1.37 03/10/2024   CALCIUM 9.3 03/10/2024   GFR 43.79 (L) 03/10/2024   GFRNONAA 30 (L) 03/28/2021    GERD Continues protonix 40 mg daily  Has been unable to get off of it  Some hiccoughs   Takes a multi vitamin  Lab Results  Component Value Date   VITAMINB12 1,142 (H) 03/27/2021   DM2 Sees endocrinology  Semaglutide  Glipepiride   Lab Results  Component Value Date   HGBA1C 8.1 01/28/2024  Trying to avoid low blood sugars in the am   Hyperlipidemia Lab Results  Component Value Date   CHOL 107 03/10/2024   CHOL 122 03/09/2023   CHOL 115 03/05/2022   Lab Results  Component Value Date   HDL 46.60 03/10/2024   HDL 45.80 03/09/2023   HDL 40.40 03/05/2022   Lab Results  Component Value Date   LDLCALC 40 03/10/2024   LDLCALC 59 03/09/2023   LDLCALC 55 03/05/2022   Lab Results   Component Value Date   TRIG 100.0 03/10/2024   TRIG 88.0 03/09/2023   TRIG 97.0 03/05/2022   Lab Results  Component Value Date   CHOLHDL 2 03/10/2024   CHOLHDL 3 03/09/2023   CHOLHDL 3 03/05/2022   No results found for: "LDLDIRECT" Crestor 5 mg every other day  At goal  Hypothyroidism  Pt has no clinical changes No change in energy level/ hair or skin/ edema and no tremor Lab Results  Component Value Date   TSH 1.98 03/10/2024    Levothyroxine 75 mcg daily   Anemia  Iron def and chronic dz  Has seen hematology in past Lab Results  Component Value Date   WBC 8.0 03/10/2024   HGB 11.6 (L) 03/10/2024   HCT 34.7 (L) 03/10/2024   MCV 105.5 (H) 03/10/2024   PLT 223.0 03/10/2024   Lab Results  Component Value Date   IRON 103 03/09/2023   TIBC 160 (L) 03/27/2021   FERRITIN 201 03/27/2021    Vision is worse  Will have to give up driving soon    Patient Active Problem List   Diagnosis Date Noted   BPH (benign prostatic hyperplasia) 03/17/2024   Current use of proton pump inhibitor 03/17/2024   Weight loss 09/23/2023   Poor balance 09/23/2023   Stress reaction 09/23/2023   Frequent urination 09/23/2023   Caregiver stress 09/23/2023   Plantar wart, left foot 03/16/2023   Renal stone 03/22/2021   Macrocytosis 10/05/2019   Routine general medical examination at a health care facility 03/09/2016   Constipation 09/17/2015   Encounter for Medicare annual wellness exam 12/08/2014   Chronic kidney disease (CKD) 05/24/2013   Anemia 11/23/2012   Hypothyroid 11/23/2012   GERD 11/29/2010   Coronary atherosclerosis 04/18/2007   Controlled type 2 diabetes mellitus with chronic kidney disease (HCC) 04/15/2007   Hyperlipidemia associated with type 2 diabetes mellitus (HCC) 04/15/2007   Essential hypertension 04/15/2007   MYOCARDIAL INFARCTION, HX OF 04/15/2007   ACTINIC KERATOSIS 04/15/2007   Past Medical History:  Diagnosis Date   Arthritis    Basal cell carcinoma  08/15/2021   right cheek, EDC   CAD (coronary artery disease)    3 stents   Colon polyp    Diabetes mellitus    type II   Dyspnea    with exertion   GERD (gastroesophageal reflux disease)    Hyperlipidemia    Hypertension  Hypothyroidism    Kidney stones    stage III CKD   Neuropathy    Peripheral vascular disease (HCC)    neuropathy in feet   Past Surgical History:  Procedure Laterality Date   APPENDECTOMY  1984   CATARACT EXTRACTION Bilateral 04/2001   CHOLECYSTECTOMY  1989   COLONOSCOPY N/A 2/14   hemorrhoids and tics (after pos ifob card)   CORONARY ANGIOPLASTY  2006   3 stents   CYSTOSCOPY/URETEROSCOPY/HOLMIUM LASER/STENT PLACEMENT Left 03/24/2021   Procedure: CYSTOSCOPY/URETEROSCOPY, RETROGRADE AND /STENT PLACEMENT;  Surgeon: Riki Altes, MD;  Location: [facility];  Service: Urology;  Laterality: Left;   ESOPHAGOGASTRODUODENOSCOPY N/A 2/14   RETINAL DETACHMENT SURGERY Right 2003   SHOULDER ARTHROSCOPY WITH ROTATOR CUFF REPAIR AND SUBACROMIAL DECOMPRESSION Right 12/30/2017   Procedure: SHOULDER ARTHROSCOPY WITH ROTATOR CUFF REPAIR AND SUBACROMIAL DECOMPRESSION;  Surgeon: Erin Sons, MD;  Location: [facility];  Service: Orthopedics;  Laterality: Right;   SHOULDER SURGERY  2012   Lt shoulder repair   Social History   Tobacco Use   Smoking status: Never    Passive exposure: Never   Smokeless tobacco: Never  Vaping Use   Vaping status: Never Used  Substance Use Topics   Alcohol use: No    Alcohol/week: 0.0 standard drinks of alcohol   Drug use: No   Family History  Problem Relation Age of Onset   Cancer Brother        prostate CA   Heart disease Mother    Stroke Father    Kidney disease Sister    Kidney disease Sister    Clotting disorder Sister    COPD Brother    Heart disease Brother    Allergies  Allergen Reactions   Adhesive [Tape] Dermatitis    Skin is thin so it is tough to take off.  Use paper tape   Azithromycin Nausea Only    REACTION:  nausea   Celecoxib Other (See Comments)    Raises blood pressure   Zocor [Simvastatin] Other (See Comments)    Muscle pain   Current Outpatient Medications on File Prior to Visit  Medication Sig Dispense Refill   acetaminophen (TYLENOL) 650 MG CR tablet Take 650 mg by mouth every 8 (eight) hours as needed.     amLODipine (NORVASC) 5 MG tablet Take 0.5 tablets (2.5 mg total) by mouth daily. 1 tablet 0   aspirin 81 MG tablet Take 81 mg by mouth daily.     Continuous Blood Gluc Receiver (FREESTYLE LIBRE 3 READER) DEVI 1 each by Does not apply route as directed. Use with sensors to monitor glucose continuously 1 each 0   Continuous Blood Gluc Sensor (FREESTYLE LIBRE 3 SENSOR) MISC Place 1 sensor on the skin every 14 days. Use to check glucose continuously 6 each 3   FEROSUL 325 (65 Fe) MG tablet TAKE ONE TABLET BY MOUTH ONCE A DAY WITH FOOD. 90 tablet 2   finasteride (PROSCAR) 5 MG tablet Take 1 tablet (5 mg total) by mouth daily. 90 tablet 3   glimepiride (AMARYL) 4 MG tablet Take 0.5 tablets (2 mg total) by mouth daily with breakfast. 180 tablet 1   levothyroxine (SYNTHROID) 75 MCG tablet TAKE ONE TABLET (75 MCG TOTAL) BY MOUTH DAILY BEFORE BREAKFAST. 90 tablet 2   pantoprazole (PROTONIX) 40 MG tablet TAKE ONE TABLET BY MOUTH ONCE A DAY 90 tablet 1   PARoxetine (PAXIL) 10 MG tablet TAKE ONE TABLET (10 MG TOTAL) BY MOUTH DAILY. 90 tablet 1  rosuvastatin (CRESTOR) 5 MG tablet TAKE ONE TABLET BY MOUTH EVERY OTHER DAY 45 tablet 3   Semaglutide,0.25 or 0.5MG /DOS, 2 MG/3ML SOPN Inject into the skin.     senna (SENOKOT) 8.6 MG tablet Take 1 tablet by mouth 2 (two) times daily.     tamsulosin (FLOMAX) 0.4 MG CAPS capsule Take 2 capsules (0.8 mg total) by mouth daily. 180 capsule 3   VITAMIN D PO Take 2,000 Units by mouth daily.     torsemide (DEMADEX) 10 MG tablet Take 10 mg by mouth daily. (Patient not taking: Reported on 03/17/2024)     [DISCONTINUED] omeprazole (PRILOSEC OTC) 20 MG tablet Take 1  tablet (20 mg total) by mouth daily. 90 tablet 1   No current facility-administered medications on file prior to visit.    Review of Systems  Constitutional:  Negative for activity change, appetite change, fatigue, fever and unexpected weight change.  HENT:  Negative for congestion, rhinorrhea, sore throat and trouble swallowing.   Eyes:  Negative for pain, redness, itching and visual disturbance.  Respiratory:  Negative for cough, chest tightness, shortness of breath and wheezing.   Cardiovascular:  Negative for chest pain and palpitations.  Gastrointestinal:  Negative for abdominal pain, blood in stool, constipation, diarrhea and nausea.  Endocrine: Negative for cold intolerance, heat intolerance, polydipsia and polyuria.       Occational low glucose reading Has cgm  Genitourinary:  Negative for difficulty urinating, dysuria, frequency and urgency.  Musculoskeletal:  Positive for arthralgias and back pain. Negative for joint swelling and myalgias.  Skin:  Negative for pallor and rash.  Neurological:  Negative for dizziness, tremors, weakness, numbness and headaches.  Hematological:  Negative for adenopathy. Does not bruise/bleed easily.  Psychiatric/Behavioral:  Negative for decreased concentration and dysphoric mood. The patient is not nervous/anxious.        Objective:   Physical Exam Constitutional:      General: He is not in acute distress.    Appearance: Normal appearance. He is well-developed and normal weight. He is not ill-appearing or diaphoretic.  HENT:     Head: Normocephalic and atraumatic.     Right Ear: Tympanic membrane, ear canal and external ear normal.     Left Ear: Tympanic membrane, ear canal and external ear normal.     Nose: Nose normal. No congestion.     Mouth/Throat:     Mouth: Mucous membranes are moist.     Pharynx: Oropharynx is clear. No posterior oropharyngeal erythema.  Eyes:     General: No scleral icterus.       Right eye: No discharge.         Left eye: No discharge.     Conjunctiva/sclera: Conjunctivae normal.     Pupils: Pupils are equal, round, and reactive to light.  Neck:     Thyroid: No thyromegaly.     Vascular: No carotid bruit or JVD.  Cardiovascular:     Rate and Rhythm: Normal rate and regular rhythm.     Pulses: Normal pulses.     Heart sounds: Normal heart sounds.     No gallop.  Pulmonary:     Effort: Pulmonary effort is normal. No respiratory distress.     Breath sounds: Normal breath sounds. No wheezing or rales.     Comments: Good air exch Chest:     Chest wall: No tenderness.  Abdominal:     General: Bowel sounds are normal. There is no distension or abdominal bruit.     Palpations:  Abdomen is soft. There is no mass.     Tenderness: There is no abdominal tenderness.     Hernia: No hernia is present.  Musculoskeletal:        General: No tenderness.     Cervical back: Normal range of motion and neck supple. No rigidity. No muscular tenderness.     Right lower leg: No edema.     Left lower leg: No edema.  Lymphadenopathy:     Cervical: No cervical adenopathy.  Skin:    General: Skin is warm and dry.     Coloration: Skin is not pale.     Findings: No erythema or rash.     Comments: Solar lentigines diffusely Sks scattered   Neurological:     Mental Status: He is alert.     Cranial Nerves: No cranial nerve deficit.     Motor: No abnormal muscle tone.     Coordination: Coordination normal.     Gait: Gait normal.     Deep Tendon Reflexes: Reflexes are normal and symmetric. Reflexes normal.  Psychiatric:        Mood and Affect: Mood normal.        Cognition and Memory: Cognition and memory normal.           Assessment & Plan:   Problem List Items Addressed This Visit       Cardiovascular and Mediastinum   Essential hypertension   bp in fair control at this time  BP Readings from Last 1 Encounters:  03/17/24 110/68   No changes needed Amlodipine 2.5 mg daily  Unsure if he is taking  the torsemide and will check with his nephrologist           Digestive   GERD   Continues protonix 40 mg daily  This controls symptoms well Has been unable to stop it  May have some dm gastroparesis         Endocrine   Hypothyroid   Lab Results  Component Value Date   TSH 1.98 03/10/2024    Levothyroxine 75 mcg  Fairly clinically stable Weight is stable now       Hyperlipidemia associated with type 2 diabetes mellitus (HCC)   Disc goals for lipids and reasons to control them Rev last labs with pt Rev low sat fat diet in detail   Plan continue crestor 5 mg every other day  LDl of 40       Controlled type 2 diabetes mellitus with chronic kidney disease (HCC)   Lab Results  Component Value Date   HGBA1C 8.1 01/28/2024   HGBA1C 7.5 (A) 06/16/2023   HGBA1C 7.8 (H) 03/09/2023   Under care of endocrinology  Some low readings Taking semaglutide Strongly encouraged to eat more regular protein for this and also to prevent muscle loss          Genitourinary   Chronic kidney disease (CKD)   GFR 43.7 Under care of nephrology   Blood pressure controlled with amlodipine  Unsure if he is taking his torsemide- encouraged to check with prescriber re: what he should be taking  Does get ankle swelling       BPH (benign prostatic hyperplasia)   Continues finasteride 5 mg daily and tamsulosisn 0.8 mg from urology to help voiding symptoms          Other   Routine general medical examination at a health care facility - Primary   Reviewed health habits including diet and exercise and skin cancer  prevention Reviewed appropriate screening tests for age  Also reviewed health mt list, fam hx and immunization status , as well as social and family history   See HPI Labs reviewed and ordered Health Maintenance  Topic Date Due   Complete foot exam   10/21/2023   Zoster (Shingles) Vaccine (1 of 2) 06/17/2025*   COVID-19 Vaccine (4 - 2024-25 season) 10/08/2025*    Hemoglobin A1C  07/27/2024   Medicare Annual Wellness Visit  08/20/2024   Eye exam for diabetics  10/21/2024   DTaP/Tdap/Td vaccine (5 - Td or Tdap) 07/29/2028   Pneumonia Vaccine  Completed   Flu Shot  Completed   HPV Vaccine  Aged Out  *Topic was postponed. The date shown is not the original due date.   Out aged colon screening  Discussed fall prevention, supplements and exercise for bone density  Encouraged protein intake nad strength building exercise to retain muscle PHQ 0

## 2024-03-22 DIAGNOSIS — I10 Essential (primary) hypertension: Secondary | ICD-10-CM | POA: Diagnosis not present

## 2024-03-22 DIAGNOSIS — E1129 Type 2 diabetes mellitus with other diabetic kidney complication: Secondary | ICD-10-CM | POA: Diagnosis not present

## 2024-03-22 DIAGNOSIS — N2581 Secondary hyperparathyroidism of renal origin: Secondary | ICD-10-CM | POA: Diagnosis not present

## 2024-03-22 DIAGNOSIS — N1832 Chronic kidney disease, stage 3b: Secondary | ICD-10-CM | POA: Diagnosis not present

## 2024-04-04 DIAGNOSIS — I10 Essential (primary) hypertension: Secondary | ICD-10-CM | POA: Diagnosis not present

## 2024-04-04 DIAGNOSIS — N1832 Chronic kidney disease, stage 3b: Secondary | ICD-10-CM | POA: Diagnosis not present

## 2024-04-04 DIAGNOSIS — E1129 Type 2 diabetes mellitus with other diabetic kidney complication: Secondary | ICD-10-CM | POA: Diagnosis not present

## 2024-04-04 DIAGNOSIS — N400 Enlarged prostate without lower urinary tract symptoms: Secondary | ICD-10-CM | POA: Diagnosis not present

## 2024-04-04 DIAGNOSIS — N2581 Secondary hyperparathyroidism of renal origin: Secondary | ICD-10-CM | POA: Diagnosis not present

## 2024-04-04 NOTE — Progress Notes (Signed)
 With occasional Patient Name: David Henry, male   Patient DOB: 1926/02/08 Date of Service: 04/04/2024  Patient MRN: [id number] Provider Creating Note: Saralee Stank, MD  [phone] Primary Care Physician: Tower, Laine LABOR, MD  [address] [place] [address] Additional Physicians/ Providers:     History of Present Illness David Henry is a 89 y.o. male  Medical problems/medical history  Hypertension Diabetes Mellitus > 25 years Anemia of CKD hypothyroidism GERD Coronary artery disease with history of PCI Mitral insufficiency Hyperlipidemia Chronic kidney disease Bilateral carotid artery stenosis Nephrolithiasis, hospitalization for kidney stone April 2022 peripheral neuropathy Peripheral vascular disease BPH  Appendectomy cataract extraction 2002 cholecystectomy 1989 retinal detachment left shoulder repair  Patient presents for follow-up along with his daughter today.    Weight is down by 3 pounds since last visit. Blood pressure is 131/70 sitting and 146/69 standing. He has 1+ edema bilaterally over the ankles. He states that he stopped taking his torsemide. He was seen by urologist and started on finasteride .   The following portions of the patient's chart were reviewed in this encounter and updated as appropriate:  Tobacco  Allergies  Meds  Problems  Med Hx  Surg Hx  Fam Hx        Medications   Current Outpatient Medications:  .  finasteride  (PROSCAR ) 5 MG tablet, Take 5 mg by mouth 1 (one) time each day, Disp: , Rfl:  .  torsemide (DEMADEX) 10 MG tablet, Take 1 tablet (10 mg total) by mouth 3 (three) times a week Take on Mon-Wed-Fri as needed for leg edema, Disp: 36 tablet, Rfl: 3 .  acetaminophen  (TYLENOL  8 HOUR) 650 MG 8 hr tablet, Take 1 tablet by mouth 3 (three) times a day, Disp: , Rfl:  .  aspirin  (ST JOSEPH) 81 MG EC tablet, Take 81 mg by mouth daily, Disp: , Rfl:  .  Cholecalciferol (Vitamin D ) 50 MCG (2000 UT) capsule, Take 2,000 Units by mouth 1  (one) time each day, Disp: , Rfl:  .  ferrous sulfate  325 (65 Fe) MG tablet, Take 325 mg by mouth 1 (one) time each day, Disp: , Rfl:  .  glimepiride  (AMARYL ) 4 MG tablet, Take 4 mg by mouth 1 (one) time each day 1 tabs 2x daily, Disp: , Rfl:  .  levothyroxine  (SYNTHROID , LEVOTHROID) 75 MCG tablet, , Disp: , Rfl:  .  Multiple Vitamins-Minerals (Centrum Silver Adult 50+) tablet, Take 1 tablet by mouth 1 (one) time each day, Disp: , Rfl:  .  pantoprazole  (PROTONIX ) 40 MG EC tablet, 40 mg 1 (one) time each day, Disp: , Rfl:  .  rosuvastatin  (CRESTOR ) 5 MG tablet, Take 1 tablet by mouth every other day, Disp: , Rfl:  .  Semaglutide,0.25 or 0.5MG /DOS, 2 MG/3ML solution pen-injector, Inject 2 mg as directed per week, Disp: , Rfl:  .  senna (SENOKOT) 8.6 MG tablet, Take 1 tablet by mouth 2 (two) times a day, Disp: , Rfl:  .  tamsulosin  (FLOMAX ) 0.4 MG 24 hr capsule, Take 1 capsule by mouth 1 (one) time each day, Disp: , Rfl:    Allergies Adhesive tape, Azithromycin, Celecoxib, and Simvastatin    Physical Exam  Vitals BP 146/69 (BP Location: Right upper arm, Patient Position: Standing)   Pulse 80   Temp 98.2 F   Wt 138 lb 9.6 oz (62.9 kg)   SpO2 100%   BMI 21.07 kg/m   Vitals reviewed. Constitutional: No distress.  Cardiovascular:  Normal rate, regular rhythm and normal heart  sounds.          He exhibits edema.  Pulmonary/Chest: Effort normal and breath sounds normal. No respiratory distress.  Abdominal: Soft. There is no abdominal tenderness. No hernia.  Skin: Skin is warm and dry.  Psychiatric: He has a normal mood and affect. His behavior is normal.     Laboratory Studies  Chemistry  Lab Units 03/22/24 0936 05/11/23 0851 11/10/22 1049 05/06/22 0920  SODIUM mmol/L 141 140 142 143  POTASSIUM mmol/L 4.9 4.9 4.6 4.5  CHLORIDE mmol/L 108 105 105 106  CO2 mmol/L 27 27 30 30   MAGNESIUM  mg/dL 1.8 2.2  --   --   CALCIUM  mg/dL 9.4 9.4 9.7 9.3  PHOSPHORUS mg/dL 3.4 3.5 2.9 3.6  PTH  pg/mL 45 78* 100* 114*  GLUCOSE mg/dL 736* 722* 808* 803*  ALBUMIN g/dL 3.8 4.2 4.2 4.2  BUN mg/dL 21 33* 35* 39*  CREATININE mg/dL 8.52* 8.12* 8.10* 8.11*        No lab exists for component: IRON  SATURATION, TRANSSATPER  CBC  Lab Units 03/22/24 0936 05/11/23 0851 11/10/22 1049 05/06/22 0920  WBC AUTO Thousand/uL 8.2 8.4 6.6 8.0  HEMOGLOBIN g/dL 89.1* 87.7* 87.4* 87.5*  HEMOGLOBIN URINE  NEGATIVE  --  NEGATIVE  --   HEMATOCRIT % 32.8* 36.7* 36.6* 37.4*  MCV fL 104.5* 102.8* 101.9* 104.5*  PLATELETS AUTO Thousand/uL 198 217 192 183    Urine  Lab Units 03/22/24 0936 11/10/22 1049 05/06/22 0920  COLOR U  YELLOW YELLOW  --   KETONES U MG/DL  NEGATIVE NEGATIVE  --   PROT/CREAT RATIO UR mg/g creat 0.318*  318* 0.162*  162*  --   ALB MG/G CREAT UR mcg/mg creat  --   --  10    Imaging and Other Studies CT abdomen pelvis without contrast 03/25/2021  IMPRESSION:  LEFT hydronephrosis with ureteral stent extending from renal pelvis  to urinary bladder.   Previously identified distal LEFT ureteral calculus no longer seen.   Atrophic RIGHT kidney with multiple renal cysts as well as a 9 mm  hyperdense lesion.   Sigmoid diverticulosis without evidence of diverticulitis.   Prostatic enlargement.   Small BILATERAL pleural effusions and bibasilar atelectasis greater  on LEFT.   Small supraumbilical and LEFT inguinal hernias containing fat and  small amount of edema.   Gaseous distension of the proximal half of the colon question ileus,  RIGHT colon up to 9.8 cm diameter.   Aortic Atherosclerosis (ICD10-I70.0).    Electronically Signed    By: Oneil Kiss M.D.    On: 03/25/2021 15:56     Orders Placed This Encounter  . CBC and Differential  . PTH, Intact  . Renal Function Panel  . Uric Acid  . Urinalysis with microscopic  . Urine Albumin / Creatinine Ratio  . Magnesium   . torsemide (DEMADEX) 10 MG tablet      Creatinine 1.62, GFR 36 from Apr 24, 2021.  Comparatively in July 2021, GFR was 31  creatinine 1.62, GFR 36 from Apr 24, 2021 Creatinine 1.98, GFR 31 from July 22, 2021 Creatinine 1.95, GFR 31 from November 11, 2021 Creatinine 1.88, GFR 33 from 05/06/2022.  Slight improvement noted.  Creatinine 1.89, GFR 32 from 11/10/2022  Hemoglobin A1c 6.7% from March 23, 2021 Hemoglobin A1c 7.6% from 06/13/2022. 06/16/2023-hemoglobin A1c 7.5% Impression/Recommendations   Patient is a 89 y.o. 1-White male with hypertension, diabetes mellitus, anemia of chronic kidney disease, hypothyroidism, GERD, coronary artery disease, nephrolithiasis, and neuropathy  1.  Diabetes mellitus with renal manifestations, type II or unspecified type, not stated as uncontrolled (HCC)   2. Stage 3b chronic kidney disease (HCC)   3. Essential hypertension   4. Hyperparathyroidism due to renal insufficiency (HCC)   5. Benign prostatic hyperplasia     Chronic kidney disease stage IIIb -chronic kidney disease risk factors include atrophic right kidney, history of hydronephrosis and left ureteral stent, atherosclerosis, diabetes and hypertension Most recent labs-03/22/2024-creatinine 1.47/GFR 44.  Significantly improved compared to GFR 33 from 05/11/2023. Continued on aspirin , rosuvastatin  for cardiovascular risk reduction. Avoid nonsteroidals, IV contrast   Not on ACE inhibitor or ARB.  Blood previously was low normal.  Diabetes type 2 with complications Most recent hemoglobin A1c 8.1% from 01/28/2024. -Managed with glimepiride  and Ozempic.  HTN -blood pressure control is acceptable for age and general condition 1+ ankle edema present Continue reduced dose of torsemide 10 mg po to 3 times per week as needed.  Secondary hyperparathyroidism  Lab Results  Component Value Date   PTH 45 03/22/2024   CALCIUM  9.4 03/22/2024   PHOS 3.4 03/22/2024    Continue vitamin D3 from over-the-counter  History of nephrolithiasis and left ureteral stent for 3 mm UVJ stone in April  2022 BPH-managed with Tamsulosin  and finasteride .  Return in about 6 months (around 10/04/2024).   Saralee Stank, MD [facility] [address], [place] [address] Ph: [phone] Fax: [phone]

## 2024-04-13 ENCOUNTER — Other Ambulatory Visit: Payer: Self-pay | Admitting: Family Medicine

## 2024-04-13 DIAGNOSIS — E119 Type 2 diabetes mellitus without complications: Secondary | ICD-10-CM

## 2024-04-19 ENCOUNTER — Other Ambulatory Visit: Payer: Self-pay | Admitting: Family Medicine

## 2024-04-25 DIAGNOSIS — E1165 Type 2 diabetes mellitus with hyperglycemia: Secondary | ICD-10-CM | POA: Diagnosis not present

## 2024-04-25 DIAGNOSIS — N1832 Chronic kidney disease, stage 3b: Secondary | ICD-10-CM | POA: Diagnosis not present

## 2024-04-25 DIAGNOSIS — E11649 Type 2 diabetes mellitus with hypoglycemia without coma: Secondary | ICD-10-CM | POA: Diagnosis not present

## 2024-04-25 DIAGNOSIS — E119 Type 2 diabetes mellitus without complications: Secondary | ICD-10-CM | POA: Diagnosis not present

## 2024-04-25 DIAGNOSIS — E1122 Type 2 diabetes mellitus with diabetic chronic kidney disease: Secondary | ICD-10-CM | POA: Diagnosis not present

## 2024-04-25 DIAGNOSIS — E1159 Type 2 diabetes mellitus with other circulatory complications: Secondary | ICD-10-CM | POA: Diagnosis not present

## 2024-04-25 DIAGNOSIS — R634 Abnormal weight loss: Secondary | ICD-10-CM | POA: Diagnosis not present

## 2024-04-25 DIAGNOSIS — Z794 Long term (current) use of insulin: Secondary | ICD-10-CM | POA: Diagnosis not present

## 2024-05-03 ENCOUNTER — Other Ambulatory Visit: Payer: Self-pay | Admitting: Family Medicine

## 2024-05-25 DIAGNOSIS — E119 Type 2 diabetes mellitus without complications: Secondary | ICD-10-CM | POA: Diagnosis not present

## 2024-07-11 DIAGNOSIS — E1165 Type 2 diabetes mellitus with hyperglycemia: Secondary | ICD-10-CM | POA: Diagnosis not present

## 2024-07-11 DIAGNOSIS — E119 Type 2 diabetes mellitus without complications: Secondary | ICD-10-CM | POA: Diagnosis not present

## 2024-07-11 DIAGNOSIS — E1159 Type 2 diabetes mellitus with other circulatory complications: Secondary | ICD-10-CM | POA: Diagnosis not present

## 2024-07-11 DIAGNOSIS — N1832 Chronic kidney disease, stage 3b: Secondary | ICD-10-CM | POA: Diagnosis not present

## 2024-07-11 DIAGNOSIS — Z794 Long term (current) use of insulin: Secondary | ICD-10-CM | POA: Diagnosis not present

## 2024-07-11 DIAGNOSIS — E1122 Type 2 diabetes mellitus with diabetic chronic kidney disease: Secondary | ICD-10-CM | POA: Diagnosis not present

## 2024-07-11 DIAGNOSIS — E11649 Type 2 diabetes mellitus with hypoglycemia without coma: Secondary | ICD-10-CM | POA: Diagnosis not present

## 2024-07-11 DIAGNOSIS — T148XXA Other injury of unspecified body region, initial encounter: Secondary | ICD-10-CM | POA: Diagnosis not present

## 2024-07-11 NOTE — Progress Notes (Signed)
 Chief complaint: Diabetes  History of present illness: David Henry is 89 y.o. male seen in follow up for type II diabetes. He is accompanied by his daughter today. He was last seen on 01/28/2024. At that time, his Ozempic was increased to better control his blood sugars. He has done well since then, but expresses concern about his continued weight loss and still very high blood sugars during the day.  His Hb A1c today is 7.8%, which is slightly improved from prior (was 8.1% in 01/2024 and 9.7% in 10/2023). He uses a [employer] 3 CGMS to monitor his sugars (uses manual receiver). Data was downloaded and reviewed. Over the last 14 days, his average sugar was 195 mg/dl. He is in target range 43%, above 57%, and below 0%. Sensor usage is 100%. Pattern shows overnight/fasting sugars are in good range (70 - low 100's) and post-prandial sugars are high but do come down after breakfast. He is currently taking glimepiride  8 mg QAM and 70/30 insulin  mix 9 units with breakfast meal only. He administers all of his medications himself and denies any issues with medication compliance. He demonstrates proficient knowledge on how to appropriately dial/inject his insulin  pen. His weight is now up 9 pounds since his last visit, which is a good thing for him. His diet is fair and he limits carbs/starches in his meals. Diabetes is complicated by stage 3b CKD (follows with Dr. Dennise at [facility]), and CAD (follows with Therisa Pierre, PA). He last had an eye exam on 10/22/2023 at [facility]. No signs of diabetic retinopathy were noted. He is on a statin.  Previously tried medications for diabetes: Ozempic -- d/c in 04/2024 after excessive weight loss with suboptimal glycemic control.   Past Medical History:  Diagnosis Date  . Actinic keratosis 04/15/2007   Qualifier: History of  By: Randeen MD, Laine Caldron  . Anemia 11/23/2012   Saw hematology and on iron   Was released  Will continue to follow  Last Assessment & Plan:   Hb 12.6  Not currently taking iron   . Bilateral carotid artery stenosis 08/01/2015   Less than 50% bilaterally  . Bradycardia 12/18/2015  . CAD (coronary artery disease)   . CKD (chronic kidney disease) stage 3, GFR 30-59 ml/min (CMS/HHS-HCC) 03/18/2017  . Diabetes mellitus type 2, uncomplicated (CMS/HHS-HCC)   . Hyperlipidemia   . Hypertension   . Hypothyroid 11/23/2012   Last Assessment & Plan:  Formatting of this note might be different from the original. Lab Results  Component Value Date   TSH 3.18 03/19/2017   No clinical changes  . Peripheral vascular disease ()   . Renal colic 06/07/2013  . Renal cysts, acquired, bilateral 06/07/2013  . Renal insufficiency 05/24/2013   Last Assessment & Plan:  Some pedal edema post op (not other areas) Short course of furosemide   Renal labs incl K on Monday planned  . Urinary frequency 06/07/2013    Past Surgical History:  Procedure Laterality Date  . PCI and stent placement LAD and left circumflex  04/2005  . SHOULDER ARTHROSCOPY DISTAL CLAVICLE EXCISION AND OPEN ROTATOR CUFF REPAIR Right 12/30/2017  . APPENDECTOMY    . CHOLECYSTECTOMY      Outpatient Medications Marked as Taking for the 07/11/24 encounter (Office Visit) with Brendia Calton Squires, PA  Medication Sig Dispense Refill  . acetaminophen  (TYLENOL ) 650 MG ER tablet Take 650 mg by mouth every 8 (eight) hours as needed for Pain.    . aspirin  81 MG EC  tablet Take 81 mg by mouth once daily.    . ergocalciferol , vitamin D2, (VITAMIN D2 ORAL) Take 200 Int'l Units/day by mouth once daily    . ferrous sulfate  325 (65 FE) MG tablet Take by mouth    . finasteride  (PROSCAR ) 5 mg tablet Take 5 mg by mouth once daily    . glimepiride  (AMARYL ) 4 MG tablet Take 2 tablets (8 mg total) by mouth daily with breakfast 60 tablet 5  . insulin  ASPART PROTAMINE-ASPART (NOVOLOG  MIX 70-30FLEXPEN U-100) 100 unit/mL (70-30) pen injector Inject 8 Units subcutaneously daily with breakfast (Patient  taking differently: Inject 9 Units subcutaneously daily with breakfast) 15 mL 1  . levothyroxine  (SYNTHROID ) 75 MCG tablet Take 75 mcg by mouth once daily    . lisinopriL  (ZESTRIL ) 20 MG tablet Take 1 tablet (20 mg total) by mouth once daily 90 tablet 3  . multivitamin-lutein (CENTRUM SILVER) tablet Take 1 tablet by mouth once daily.    . ondansetron  (ZOFRAN -ODT) 4 MG disintegrating tablet Take 1 tablet by mouth as needed    . pantoprazole  (PROTONIX ) 40 MG DR tablet Take 1 tablet by mouth once daily    . rosuvastatin  (CRESTOR ) 5 MG tablet Take 5 mg by mouth every other day.    . sennosides (SENOKOT) 8.6 mg tablet Take 1 tablet by mouth 2 (two) times daily.    . tamsulosin  (FLOMAX ) 0.4 mg capsule Take 0.4 mg by mouth once daily.      Exam: BP 128/78   Pulse 66   Ht 167.6 cm (5' 6)   Wt 64 kg (141 lb)   SpO2 98%   BMI 22.76 kg/m  GEN: well developed, well nourished, in NAD. HEENT: No proptosis. EOMI. No lid lag or stare.  NEURO: PERRL.  PSYC: alert and oriented, good insight   Labs Lab Results  Component Value Date   HGBA1C 7.8 (H) 07/11/2024   HGBA1C 8.1 (H) 01/28/2024   Renal Function Panel (03/22/2024) Order: [id number] Component Ref Range & Units 3 mo ago  Glucose 65 - 99 mg/dL 736 High   Comment:                                                                                             Fasting reference interval                                               For someone without known diabetes, a glucose      value >125 mg/dL indicates that they may have      diabetes and this should be confirmed with a      follow-up test.         BUN 7 - 25 mg/dL 21  Creatinine 9.29 - 8.77 mg/dL 8.52 High   eGFR CKD-EPI CR 2021 > OR = 60 mL/min/1.36m2 44 Low   BUN/Creatinine Ratio 6 - 22 (calc) 14  Sodium 135 - 146 mmol/L 141  Potassium 3.5 - 5.3 mmol/L 4.9  Chloride 98 -  110 mmol/L 108  Bicarbonate (CO2) 20 - 32 mmol/L 27  Calcium  8.6 - 10.3 mg/dL 9.4  Phosphorus 2.1  - 4.3 mg/dL 3.4  Albumin 3.6 - 5.1 g/dL 3.8   Protein, Total, Random Urine w/Creatinine (03/22/2024) Order: [id number] Component Ref Range & Units 3 mo ago  Creatinine, Ur 20 - 320 mg/dL 892  Urine Protein/Creatinine Ratio 25 - 148 mg/g creat 318 High   Protein/Creatinine Ratio, Urine 0.025 - 0.148 mg/mg creat 0.318 High   Protein Urine Random 5 - 25 mg/dL 34 High     Assessment: 1. Type 2 diabetes mellitus treated with insulin  (CMS/HHS-HCC)   2. Type 2 diabetes mellitus with hypoglycemia without coma, with long-term current use of insulin  (CMS/HHS-HCC)   3. Type 2 diabetes mellitus with hyperglycemia, with long-term current use of insulin  (CMS/HHS-HCC)   4. Type 2 diabetes mellitus with other circulatory complication, with long-term current use of insulin  (CMS/HHS-HCC)   5. Type 2 diabetes mellitus with stage 3b chronic kidney disease, with long-term current use of insulin  (CMS/HHS-HCC)   6. Hematoma     Plan: - Diabetes is relatively controlled and Hb A1c is at target. A reasonable target Hb A1c for his age is <7.5-8.0%, with the added goal of avoiding hypoglycemia. Diabetes is complicated by variable sugars (has low-normal overnight/fasting sugars and modest post-prandial hyperglycemia) as well as progressive weight loss that has improved since discontinuing his GLP-1 RA.   - Will have him continue Novolog  70/30 insulin  mix PEN. Advised he take 9 units with breakfast meal only. Will not have him do dose with supper at this time because his sugars drop rapidly overnight already.   - Continue glimepiride  8 mg in the morning with breakfast. Written instructions provided and reviewed with patient today. Advised he contact our office of any low blood sugars (below 70). Will reduce dose of glimepiride  if this happens. - Instructed to monitor blood sugars 4-5x/day with Libre 3 sensor. Reviewed target fasting and post-prandial glucose ranges. Continue using Libre 3 sensors with manual  receiver.  Reminded him to bring blood sugar log and/or meter to every visit.  - Continue statin. - Continue annual eye exams. He is up to date on this. - Continue follow up with nephrology, cardiology and primary care physician as planned. - He developed a hematoma after having his lab drawn for the STAT A1c today. We placed an ice pack with an ACE wrap on his left UE today. Advised he keep the ice pack on for 24 hours, and to keep constant pressure on the hematoma with the ACE wrap. Also advised he elevate his arm whenever possible.  - Follow up in 3 months, or sooner if there are any issues.    Attestation Statement:   I personally performed the service, non-incident to. (WP)   CASSANDRA BUEL COHN, PA

## 2024-07-26 NOTE — Progress Notes (Unsigned)
 07/27/2024 2:05 PM   ELEUTERIO Henry [birth year] [id number]  Referring provider: Randeen Laine LABOR, MD [address] [place],  [place] [address]  Urological history: 1. Nephrolithiasis -left URS for 3 mm UVJ stone 03/2021   2. Urinary retention -post-op retention after URS 03/2021  3. BPH with LU TS -Tamsulosin  0.4 mg two capsules daily  -finasteride  5 mg daily   Chief Complaint  Patient presents with   Medical Management of Chronic Issues   HPI: David Henry is a 89 y.o. male who presents today for 88-month follow-up with his daughter, David Henry.    Previous records reviewed.   He is taking tamsulosin  0.8 mg daily and finasteride  5 mg daily.  He still has some urgency and mild urine incontinence especially if he has been sitting for a while.  Patient denies any modifying or aggravating factors.  Patient denies any recent UTI's, gross hematuria, dysuria or suprapubic/flank pain.  Patient denies any fevers, chills, nausea or vomiting.    Hemoglobin A1c (06/2024) 7.8  Serum creatinine (03/2024) 1.47, eGFR 44    PMH: Past Medical History:  Diagnosis Date   Arthritis    Basal cell carcinoma 08/15/2021   right cheek, EDC   CAD (coronary artery disease)    3 stents   Colon polyp    Diabetes mellitus    type II   Dyspnea    with exertion   GERD (gastroesophageal reflux disease)    Hyperlipidemia    Hypertension    Hypothyroidism    Kidney stones    stage III CKD   Neuropathy    Peripheral vascular disease (HCC)    neuropathy in feet    Surgical History: Past Surgical History:  Procedure Laterality Date   APPENDECTOMY  1984   CATARACT EXTRACTION Bilateral 04/2001   CHOLECYSTECTOMY  1989   COLONOSCOPY N/A 2/14   hemorrhoids and tics (after pos ifob card)   CORONARY ANGIOPLASTY  2006   3 stents   CYSTOSCOPY/URETEROSCOPY/HOLMIUM LASER/STENT PLACEMENT Left 03/24/2021   Procedure: CYSTOSCOPY/URETEROSCOPY, RETROGRADE AND /STENT PLACEMENT;  Surgeon: Twylla Glendia BROCKS, MD;  Location: [facility] ORS;  Service: Urology;  Laterality: Left;   ESOPHAGOGASTRODUODENOSCOPY N/A 2/14   RETINAL DETACHMENT SURGERY Right 2003   SHOULDER ARTHROSCOPY WITH ROTATOR CUFF REPAIR AND SUBACROMIAL DECOMPRESSION Right 12/30/2017   Procedure: SHOULDER ARTHROSCOPY WITH ROTATOR CUFF REPAIR AND SUBACROMIAL DECOMPRESSION;  Surgeon: Maryl Barters, MD;  Location: [facility] ORS;  Service: Orthopedics;  Laterality: Right;   SHOULDER SURGERY  2012   Lt shoulder repair    Home Medications:  Allergies as of 07/27/2024       Reactions   Adhesive [tape] Dermatitis   Skin is thin so it is tough to take off.  Use paper tape   Azithromycin Nausea Only   REACTION: nausea   Celecoxib Other (See Comments)   Raises blood pressure   Zocor  [simvastatin ] Other (See Comments)   Muscle pain        Medication List        Accurate as of July 27, 2024  2:05 PM. If you have any questions, ask your nurse or doctor.          acetaminophen  650 MG CR tablet Commonly known as: TYLENOL  Take 650 mg by mouth every 8 (eight) hours as needed.   amLODipine  5 MG tablet Commonly known as: NORVASC  Take 0.5 tablets (2.5 mg total) by mouth daily.   aspirin  81 MG tablet Take 81 mg by mouth daily.  FeroSul 325 (65 Fe) MG tablet Generic drug: ferrous sulfate  TAKE ONE TABLET BY MOUTH ONCE A DAY WITH FOOD.   finasteride  5 MG tablet Commonly known as: PROSCAR  Take 1 tablet (5 mg total) by mouth daily.   FreeStyle Libre 3 Reader [place] 1 each by Does not apply route as directed. Use with sensors to monitor glucose continuously   FreeStyle Libre 3 Sensor Misc Place 1 sensor on the skin every 14 days. Use to check glucose continuously   glimepiride  4 MG tablet Commonly known as: AMARYL  Take 0.5 tablets (2 mg total) by mouth daily with breakfast.   levothyroxine  75 MCG tablet Commonly known as: SYNTHROID  TAKE ONE TABLET (75 MCG TOTAL) BY MOUTH DAILY BEFORE BREAKFAST.   pantoprazole  40 MG  tablet Commonly known as: PROTONIX  TAKE ONE TABLET BY MOUTH ONCE A DAY   PARoxetine  10 MG tablet Commonly known as: PAXIL  TAKE ONE TABLET (10 MG TOTAL) BY MOUTH DAILY.   rosuvastatin  5 MG tablet Commonly known as: CRESTOR  TAKE ONE TABLET BY MOUTH EVERY OTHER DAY   Semaglutide(0.25 or 0.5MG /DOS) 2 MG/3ML Sopn Inject into the skin.   senna 8.6 MG tablet Commonly known as: SENOKOT Take 1 tablet by mouth 2 (two) times daily.   tamsulosin  0.4 MG Caps capsule Commonly known as: FLOMAX  Take 2 capsules (0.8 mg total) by mouth daily.   torsemide 10 MG tablet Commonly known as: DEMADEX Take 10 mg by mouth daily.   VITAMIN D  PO Take 2,000 Units by mouth daily.        Allergies:  Allergies  Allergen Reactions   Adhesive [Tape] Dermatitis    Skin is thin so it is tough to take off.  Use paper tape   Azithromycin Nausea Only    REACTION: nausea   Celecoxib Other (See Comments)    Raises blood pressure   Zocor  [Simvastatin ] Other (See Comments)    Muscle pain    Family History: Family History  Problem Relation Age of Onset   Cancer Brother        prostate CA   Heart disease Mother    Stroke Father    Kidney disease Sister    Kidney disease Sister    Clotting disorder Sister    COPD Brother    Heart disease Brother     Social History:  reports that he has never smoked. He has never been exposed to tobacco smoke. He has never used smokeless tobacco. He reports that he does not drink alcohol and does not use drugs.  ROS: Pertinent ROS in HPI  Physical Exam: BP (!) 162/66   Pulse (!) 57   Ht 5' 9 (1.753 m)   Wt 135 lb (61.2 kg)   BMI 19.94 kg/m   Constitutional:  Well nourished. Alert and oriented, No acute distress. HEENT: [place] AT, moist mucus membranes.  Trachea midline Cardiovascular: No clubbing, cyanosis, or edema. Respiratory: Normal respiratory effort, no increased work of breathing. Neurologic: Grossly intact, no focal deficits, moving all 4  extremities. Psychiatric: Normal mood and affect.   Laboratory Data: See EPIC and HPI I have reviewed the labs.  See HPI.     Pertinent imaging: N/A   Assessment & Plan:    1. BPH with LU TS -most bothersome symptoms are urge incontinence  -continue conservative management, avoiding bladder irritants and timed voiding's  -continue finasteride  5 mg  -Continue tamsulosin  0.4 mg two daily  -continue timed voids while awake  - We discussed the medication Gemtesa for urge  incontinence, but after listing the possibility he may go into urinary retention and the possibility is insurance will cover the medication we have deferred for now   2.  Nephrolithiasis -No symptoms of renal colic or episodes of gross hematuria    Return in about 1 year (around 07/27/2025) for symptom recheck .  These notes generated with voice recognition software. I apologize for typographical errors.  CLOTILDA HELON RIGGERS  [facility] [address]  [address] [place], [place] [address] [phone]

## 2024-07-27 ENCOUNTER — Encounter: Payer: Self-pay | Admitting: Urology

## 2024-07-27 ENCOUNTER — Ambulatory Visit: Payer: Medicare PPO | Admitting: Urology

## 2024-07-27 VITALS — BP 162/66 | HR 57 | Ht 69.0 in | Wt 135.0 lb

## 2024-07-27 DIAGNOSIS — N138 Other obstructive and reflux uropathy: Secondary | ICD-10-CM

## 2024-07-27 DIAGNOSIS — N200 Calculus of kidney: Secondary | ICD-10-CM | POA: Diagnosis not present

## 2024-07-27 DIAGNOSIS — N401 Enlarged prostate with lower urinary tract symptoms: Secondary | ICD-10-CM | POA: Diagnosis not present

## 2024-07-27 MED ORDER — TAMSULOSIN HCL 0.4 MG PO CAPS
0.8000 mg | ORAL_CAPSULE | Freq: Every day | ORAL | 3 refills | Status: AC
Start: 2024-07-27 — End: ?

## 2024-07-27 MED ORDER — FINASTERIDE 5 MG PO TABS: 5.0000 mg | ORAL_TABLET | Freq: Every day | ORAL | 3 refills | Status: AC

## 2024-07-28 DIAGNOSIS — H6123 Impacted cerumen, bilateral: Secondary | ICD-10-CM | POA: Diagnosis not present

## 2024-07-28 DIAGNOSIS — H903 Sensorineural hearing loss, bilateral: Secondary | ICD-10-CM | POA: Diagnosis not present

## 2024-08-08 ENCOUNTER — Telehealth: Payer: Self-pay | Admitting: Urology

## 2024-08-08 NOTE — Telephone Encounter (Signed)
 Patients daughter called in and stated that the patients lower extremities are severely swollen, and he has been taking Torsemide daily rather than 3 times weekly. After speaking with Dr. Dennise a new prescription has been sent to his pharmacy at this time.

## 2024-08-08 NOTE — Telephone Encounter (Signed)
 Patient called the office inquiring about the delay in his torsemide prescription being called in. He also expressed confusion regarding the purpose of taking finasteride  and tamsulosin . He ended the call stating that he will manage with what he currently has and deal with it. A follow-up call may be necessary to provide further clarification on his medications.

## 2024-08-15 DIAGNOSIS — N1831 Chronic kidney disease, stage 3a: Secondary | ICD-10-CM | POA: Diagnosis not present

## 2024-08-15 DIAGNOSIS — I10 Essential (primary) hypertension: Secondary | ICD-10-CM | POA: Diagnosis not present

## 2024-08-15 DIAGNOSIS — E782 Mixed hyperlipidemia: Secondary | ICD-10-CM | POA: Diagnosis not present

## 2024-08-15 DIAGNOSIS — R001 Bradycardia, unspecified: Secondary | ICD-10-CM | POA: Diagnosis not present

## 2024-08-15 DIAGNOSIS — I340 Nonrheumatic mitral (valve) insufficiency: Secondary | ICD-10-CM | POA: Diagnosis not present

## 2024-08-15 DIAGNOSIS — I2510 Atherosclerotic heart disease of native coronary artery without angina pectoris: Secondary | ICD-10-CM | POA: Diagnosis not present

## 2024-08-15 DIAGNOSIS — E119 Type 2 diabetes mellitus without complications: Secondary | ICD-10-CM | POA: Diagnosis not present

## 2024-08-15 DIAGNOSIS — Z794 Long term (current) use of insulin: Secondary | ICD-10-CM | POA: Diagnosis not present

## 2024-08-23 ENCOUNTER — Encounter

## 2024-08-24 DIAGNOSIS — E119 Type 2 diabetes mellitus without complications: Secondary | ICD-10-CM | POA: Diagnosis not present

## 2024-09-06 ENCOUNTER — Other Ambulatory Visit: Payer: Self-pay | Admitting: Family Medicine

## 2024-10-03 DIAGNOSIS — N2581 Secondary hyperparathyroidism of renal origin: Secondary | ICD-10-CM | POA: Diagnosis not present

## 2024-10-03 DIAGNOSIS — I10 Essential (primary) hypertension: Secondary | ICD-10-CM | POA: Diagnosis not present

## 2024-10-03 DIAGNOSIS — E1129 Type 2 diabetes mellitus with other diabetic kidney complication: Secondary | ICD-10-CM | POA: Diagnosis not present

## 2024-10-03 DIAGNOSIS — N400 Enlarged prostate without lower urinary tract symptoms: Secondary | ICD-10-CM | POA: Diagnosis not present

## 2024-10-03 DIAGNOSIS — N1832 Chronic kidney disease, stage 3b: Secondary | ICD-10-CM | POA: Diagnosis not present

## 2024-10-10 DIAGNOSIS — N1832 Chronic kidney disease, stage 3b: Secondary | ICD-10-CM | POA: Diagnosis not present

## 2024-10-10 DIAGNOSIS — N2581 Secondary hyperparathyroidism of renal origin: Secondary | ICD-10-CM | POA: Diagnosis not present

## 2024-10-10 DIAGNOSIS — E1122 Type 2 diabetes mellitus with diabetic chronic kidney disease: Secondary | ICD-10-CM | POA: Diagnosis not present

## 2024-10-10 DIAGNOSIS — I10 Essential (primary) hypertension: Secondary | ICD-10-CM | POA: Diagnosis not present

## 2024-10-10 DIAGNOSIS — N189 Chronic kidney disease, unspecified: Secondary | ICD-10-CM | POA: Diagnosis not present

## 2024-10-18 DIAGNOSIS — Z794 Long term (current) use of insulin: Secondary | ICD-10-CM | POA: Diagnosis not present

## 2024-10-18 DIAGNOSIS — E1165 Type 2 diabetes mellitus with hyperglycemia: Secondary | ICD-10-CM | POA: Diagnosis not present

## 2024-10-18 DIAGNOSIS — E11649 Type 2 diabetes mellitus with hypoglycemia without coma: Secondary | ICD-10-CM | POA: Diagnosis not present

## 2024-10-18 DIAGNOSIS — E119 Type 2 diabetes mellitus without complications: Secondary | ICD-10-CM | POA: Diagnosis not present

## 2024-10-18 DIAGNOSIS — N1832 Chronic kidney disease, stage 3b: Secondary | ICD-10-CM | POA: Diagnosis not present

## 2024-10-18 DIAGNOSIS — E1122 Type 2 diabetes mellitus with diabetic chronic kidney disease: Secondary | ICD-10-CM | POA: Diagnosis not present

## 2024-10-18 DIAGNOSIS — E1159 Type 2 diabetes mellitus with other circulatory complications: Secondary | ICD-10-CM | POA: Diagnosis not present

## 2024-10-20 ENCOUNTER — Other Ambulatory Visit: Payer: Self-pay | Admitting: Family Medicine

## 2024-10-24 DIAGNOSIS — Z961 Presence of intraocular lens: Secondary | ICD-10-CM | POA: Diagnosis not present

## 2024-10-24 DIAGNOSIS — H35373 Puckering of macula, bilateral: Secondary | ICD-10-CM | POA: Diagnosis not present

## 2024-10-24 DIAGNOSIS — H4322 Crystalline deposits in vitreous body, left eye: Secondary | ICD-10-CM | POA: Diagnosis not present

## 2024-10-24 LAB — OPHTHALMOLOGY REPORT-SCANNED

## 2024-10-25 ENCOUNTER — Encounter

## 2024-10-26 ENCOUNTER — Ambulatory Visit

## 2024-11-07 DIAGNOSIS — Z7984 Long term (current) use of oral hypoglycemic drugs: Secondary | ICD-10-CM | POA: Diagnosis not present

## 2024-11-07 DIAGNOSIS — N1832 Chronic kidney disease, stage 3b: Secondary | ICD-10-CM | POA: Diagnosis not present

## 2024-11-07 DIAGNOSIS — I10 Essential (primary) hypertension: Secondary | ICD-10-CM | POA: Diagnosis not present

## 2024-11-07 DIAGNOSIS — N2581 Secondary hyperparathyroidism of renal origin: Secondary | ICD-10-CM | POA: Diagnosis not present

## 2024-11-07 DIAGNOSIS — E1122 Type 2 diabetes mellitus with diabetic chronic kidney disease: Secondary | ICD-10-CM | POA: Diagnosis not present

## 2024-11-09 ENCOUNTER — Encounter

## 2024-11-20 ENCOUNTER — Observation Stay
Admission: EM | Admit: 2024-11-20 | Discharge: 2024-11-22 | DRG: 312 | Disposition: A | Attending: Internal Medicine | Admitting: Internal Medicine

## 2024-11-20 ENCOUNTER — Observation Stay

## 2024-11-20 ENCOUNTER — Emergency Department

## 2024-11-20 ENCOUNTER — Other Ambulatory Visit: Payer: Self-pay

## 2024-11-20 DIAGNOSIS — Z87442 Personal history of urinary calculi: Secondary | ICD-10-CM

## 2024-11-20 DIAGNOSIS — R9089 Other abnormal findings on diagnostic imaging of central nervous system: Secondary | ICD-10-CM | POA: Diagnosis not present

## 2024-11-20 DIAGNOSIS — S2231XA Fracture of one rib, right side, initial encounter for closed fracture: Secondary | ICD-10-CM | POA: Diagnosis present

## 2024-11-20 DIAGNOSIS — N1832 Chronic kidney disease, stage 3b: Secondary | ICD-10-CM | POA: Diagnosis not present

## 2024-11-20 DIAGNOSIS — Y93E1 Activity, personal bathing and showering: Secondary | ICD-10-CM

## 2024-11-20 DIAGNOSIS — E1151 Type 2 diabetes mellitus with diabetic peripheral angiopathy without gangrene: Secondary | ICD-10-CM | POA: Diagnosis present

## 2024-11-20 DIAGNOSIS — S199XXA Unspecified injury of neck, initial encounter: Secondary | ICD-10-CM | POA: Diagnosis not present

## 2024-11-20 DIAGNOSIS — R0781 Pleurodynia: Secondary | ICD-10-CM | POA: Diagnosis not present

## 2024-11-20 DIAGNOSIS — Z886 Allergy status to analgesic agent status: Secondary | ICD-10-CM

## 2024-11-20 DIAGNOSIS — R0789 Other chest pain: Secondary | ICD-10-CM | POA: Diagnosis present

## 2024-11-20 DIAGNOSIS — R9431 Abnormal electrocardiogram [ECG] [EKG]: Secondary | ICD-10-CM | POA: Diagnosis not present

## 2024-11-20 DIAGNOSIS — E039 Hypothyroidism, unspecified: Secondary | ICD-10-CM | POA: Diagnosis not present

## 2024-11-20 DIAGNOSIS — D72829 Elevated white blood cell count, unspecified: Secondary | ICD-10-CM | POA: Diagnosis not present

## 2024-11-20 DIAGNOSIS — E1122 Type 2 diabetes mellitus with diabetic chronic kidney disease: Secondary | ICD-10-CM | POA: Diagnosis present

## 2024-11-20 DIAGNOSIS — I130 Hypertensive heart and chronic kidney disease with heart failure and stage 1 through stage 4 chronic kidney disease, or unspecified chronic kidney disease: Secondary | ICD-10-CM | POA: Diagnosis present

## 2024-11-20 DIAGNOSIS — I5A Non-ischemic myocardial injury (non-traumatic): Secondary | ICD-10-CM | POA: Diagnosis present

## 2024-11-20 DIAGNOSIS — I6782 Cerebral ischemia: Secondary | ICD-10-CM | POA: Diagnosis not present

## 2024-11-20 DIAGNOSIS — Z79899 Other long term (current) drug therapy: Secondary | ICD-10-CM

## 2024-11-20 DIAGNOSIS — S0181XA Laceration without foreign body of other part of head, initial encounter: Secondary | ICD-10-CM | POA: Diagnosis not present

## 2024-11-20 DIAGNOSIS — Z9049 Acquired absence of other specified parts of digestive tract: Secondary | ICD-10-CM

## 2024-11-20 DIAGNOSIS — M25522 Pain in left elbow: Secondary | ICD-10-CM | POA: Diagnosis not present

## 2024-11-20 DIAGNOSIS — E785 Hyperlipidemia, unspecified: Secondary | ICD-10-CM

## 2024-11-20 DIAGNOSIS — N179 Acute kidney failure, unspecified: Secondary | ICD-10-CM | POA: Diagnosis present

## 2024-11-20 DIAGNOSIS — E1169 Type 2 diabetes mellitus with other specified complication: Secondary | ICD-10-CM | POA: Diagnosis present

## 2024-11-20 DIAGNOSIS — K5732 Diverticulitis of large intestine without perforation or abscess without bleeding: Secondary | ICD-10-CM | POA: Diagnosis present

## 2024-11-20 DIAGNOSIS — W19XXXA Unspecified fall, initial encounter: Secondary | ICD-10-CM | POA: Insufficient documentation

## 2024-11-20 DIAGNOSIS — Z66 Do not resuscitate: Secondary | ICD-10-CM | POA: Diagnosis present

## 2024-11-20 DIAGNOSIS — E1142 Type 2 diabetes mellitus with diabetic polyneuropathy: Secondary | ICD-10-CM | POA: Diagnosis present

## 2024-11-20 DIAGNOSIS — N400 Enlarged prostate without lower urinary tract symptoms: Secondary | ICD-10-CM | POA: Diagnosis present

## 2024-11-20 DIAGNOSIS — Z9841 Cataract extraction status, right eye: Secondary | ICD-10-CM

## 2024-11-20 DIAGNOSIS — I5032 Chronic diastolic (congestive) heart failure: Secondary | ICD-10-CM | POA: Diagnosis not present

## 2024-11-20 DIAGNOSIS — Z794 Long term (current) use of insulin: Secondary | ICD-10-CM

## 2024-11-20 DIAGNOSIS — Z955 Presence of coronary angioplasty implant and graft: Secondary | ICD-10-CM

## 2024-11-20 DIAGNOSIS — Z9842 Cataract extraction status, left eye: Secondary | ICD-10-CM

## 2024-11-20 DIAGNOSIS — K8689 Other specified diseases of pancreas: Secondary | ICD-10-CM | POA: Diagnosis not present

## 2024-11-20 DIAGNOSIS — K219 Gastro-esophageal reflux disease without esophagitis: Secondary | ICD-10-CM | POA: Diagnosis present

## 2024-11-20 DIAGNOSIS — Z7982 Long term (current) use of aspirin: Secondary | ICD-10-CM

## 2024-11-20 DIAGNOSIS — Z881 Allergy status to other antibiotic agents status: Secondary | ICD-10-CM

## 2024-11-20 DIAGNOSIS — F32A Depression, unspecified: Secondary | ICD-10-CM | POA: Diagnosis present

## 2024-11-20 DIAGNOSIS — I951 Orthostatic hypotension: Principal | ICD-10-CM | POA: Diagnosis present

## 2024-11-20 DIAGNOSIS — I340 Nonrheumatic mitral (valve) insufficiency: Secondary | ICD-10-CM | POA: Diagnosis present

## 2024-11-20 DIAGNOSIS — I2510 Atherosclerotic heart disease of native coronary artery without angina pectoris: Secondary | ICD-10-CM | POA: Diagnosis present

## 2024-11-20 DIAGNOSIS — K5730 Diverticulosis of large intestine without perforation or abscess without bleeding: Secondary | ICD-10-CM | POA: Diagnosis not present

## 2024-11-20 DIAGNOSIS — R000 Tachycardia, unspecified: Secondary | ICD-10-CM | POA: Diagnosis not present

## 2024-11-20 DIAGNOSIS — Z79890 Hormone replacement therapy (postmenopausal): Secondary | ICD-10-CM

## 2024-11-20 DIAGNOSIS — Z8249 Family history of ischemic heart disease and other diseases of the circulatory system: Secondary | ICD-10-CM

## 2024-11-20 DIAGNOSIS — I452 Bifascicular block: Secondary | ICD-10-CM | POA: Diagnosis present

## 2024-11-20 DIAGNOSIS — E1129 Type 2 diabetes mellitus with other diabetic kidney complication: Secondary | ICD-10-CM | POA: Diagnosis present

## 2024-11-20 DIAGNOSIS — Z888 Allergy status to other drugs, medicaments and biological substances status: Secondary | ICD-10-CM

## 2024-11-20 DIAGNOSIS — S51012A Laceration without foreign body of left elbow, initial encounter: Secondary | ICD-10-CM | POA: Diagnosis present

## 2024-11-20 DIAGNOSIS — S0990XA Unspecified injury of head, initial encounter: Secondary | ICD-10-CM | POA: Diagnosis not present

## 2024-11-20 DIAGNOSIS — D539 Nutritional anemia, unspecified: Secondary | ICD-10-CM | POA: Diagnosis not present

## 2024-11-20 DIAGNOSIS — Z7984 Long term (current) use of oral hypoglycemic drugs: Secondary | ICD-10-CM

## 2024-11-20 DIAGNOSIS — E7849 Other hyperlipidemia: Secondary | ICD-10-CM | POA: Diagnosis present

## 2024-11-20 DIAGNOSIS — M1990 Unspecified osteoarthritis, unspecified site: Secondary | ICD-10-CM | POA: Diagnosis present

## 2024-11-20 DIAGNOSIS — I4581 Long QT syndrome: Secondary | ICD-10-CM | POA: Diagnosis not present

## 2024-11-20 DIAGNOSIS — R55 Syncope and collapse: Principal | ICD-10-CM | POA: Diagnosis present

## 2024-11-20 DIAGNOSIS — I2489 Other forms of acute ischemic heart disease: Secondary | ICD-10-CM | POA: Diagnosis present

## 2024-11-20 DIAGNOSIS — Z7985 Long-term (current) use of injectable non-insulin antidiabetic drugs: Secondary | ICD-10-CM

## 2024-11-20 DIAGNOSIS — S92401A Displaced unspecified fracture of right great toe, initial encounter for closed fracture: Secondary | ICD-10-CM | POA: Diagnosis present

## 2024-11-20 DIAGNOSIS — W1830XA Fall on same level, unspecified, initial encounter: Secondary | ICD-10-CM | POA: Diagnosis present

## 2024-11-20 DIAGNOSIS — M778 Other enthesopathies, not elsewhere classified: Secondary | ICD-10-CM | POA: Diagnosis not present

## 2024-11-20 DIAGNOSIS — Y92002 Bathroom of unspecified non-institutional (private) residence single-family (private) house as the place of occurrence of the external cause: Secondary | ICD-10-CM

## 2024-11-20 DIAGNOSIS — Z85828 Personal history of other malignant neoplasm of skin: Secondary | ICD-10-CM

## 2024-11-20 DIAGNOSIS — Z8419 Family history of other disorders of kidney and ureter: Secondary | ICD-10-CM

## 2024-11-20 DIAGNOSIS — Z1152 Encounter for screening for COVID-19: Secondary | ICD-10-CM

## 2024-11-20 DIAGNOSIS — S92404A Nondisplaced unspecified fracture of right great toe, initial encounter for closed fracture: Secondary | ICD-10-CM | POA: Diagnosis present

## 2024-11-20 DIAGNOSIS — Z860100 Personal history of colon polyps, unspecified: Secondary | ICD-10-CM

## 2024-11-20 DIAGNOSIS — Z9109 Other allergy status, other than to drugs and biological substances: Secondary | ICD-10-CM

## 2024-11-20 DIAGNOSIS — S92534A Nondisplaced fracture of distal phalanx of right lesser toe(s), initial encounter for closed fracture: Secondary | ICD-10-CM | POA: Diagnosis not present

## 2024-11-20 LAB — RESP PANEL BY RT-PCR (RSV, FLU A&B, COVID)  RVPGX2
Influenza A by PCR: NEGATIVE
Influenza B by PCR: NEGATIVE
Resp Syncytial Virus by PCR: NEGATIVE
SARS Coronavirus 2 by RT PCR: NEGATIVE

## 2024-11-20 LAB — COMPREHENSIVE METABOLIC PANEL WITH GFR
ALT: 56 U/L — ABNORMAL HIGH (ref 0–44)
AST: 37 U/L (ref 15–41)
Albumin: 4.1 g/dL (ref 3.5–5.0)
Alkaline Phosphatase: 112 U/L (ref 38–126)
Anion gap: 11 (ref 5–15)
BUN: 32 mg/dL — ABNORMAL HIGH (ref 8–23)
CO2: 25 mmol/L (ref 22–32)
Calcium: 9.7 mg/dL (ref 8.9–10.3)
Chloride: 106 mmol/L (ref 98–111)
Creatinine, Ser: 1.73 mg/dL — ABNORMAL HIGH (ref 0.61–1.24)
GFR, Estimated: 36 mL/min — ABNORMAL LOW (ref >60)
Glucose, Bld: 192 mg/dL — ABNORMAL HIGH (ref 70–99)
Potassium: 4.4 mmol/L (ref 3.5–5.1)
Sodium: 142 mmol/L (ref 135–145)
Total Bilirubin: 0.7 mg/dL (ref 0.0–1.2)
Total Protein: 6.6 g/dL (ref 6.5–8.1)

## 2024-11-20 LAB — TROPONIN T, HIGH SENSITIVITY
Troponin T High Sensitivity: 61 ng/L — ABNORMAL HIGH (ref 0–19)
Troponin T High Sensitivity: 59 ng/L — ABNORMAL HIGH (ref 0–19)
Troponin T High Sensitivity: 59 ng/L — ABNORMAL HIGH (ref 0–19)

## 2024-11-20 LAB — URINALYSIS, ROUTINE W REFLEX MICROSCOPIC
Bilirubin Urine: NEGATIVE
Glucose, UA: NEGATIVE mg/dL
Hgb urine dipstick: NEGATIVE
Ketones, ur: NEGATIVE mg/dL
Leukocytes,Ua: NEGATIVE
Nitrite: NEGATIVE
Protein, ur: NEGATIVE mg/dL
Specific Gravity, Urine: 1.011 (ref 1.005–1.030)
pH: 5 (ref 5.0–8.0)

## 2024-11-20 LAB — CBG MONITORING, ED: Glucose-Capillary: 282 mg/dL — ABNORMAL HIGH (ref 70–99)

## 2024-11-20 LAB — CBC
HCT: 36 % — ABNORMAL LOW (ref 39.0–52.0)
Hemoglobin: 11.8 g/dL — ABNORMAL LOW (ref 13.0–17.0)
MCH: 33.9 pg (ref 26.0–34.0)
MCHC: 32.8 g/dL (ref 30.0–36.0)
MCV: 103.4 fL — ABNORMAL HIGH (ref 80.0–100.0)
Platelets: 198 K/uL (ref 150–400)
RBC: 3.48 MIL/uL — ABNORMAL LOW (ref 4.22–5.81)
RDW: 14.5 % (ref 11.5–15.5)
WBC: 13.3 K/uL — ABNORMAL HIGH (ref 4.0–10.5)
nRBC: 0 % (ref 0.0–0.2)

## 2024-11-20 LAB — PRO BRAIN NATRIURETIC PEPTIDE: Pro Brain Natriuretic Peptide: 673 pg/mL — ABNORMAL HIGH (ref <300.0)

## 2024-11-20 LAB — MAGNESIUM: Magnesium: 1.9 mg/dL (ref 1.7–2.4)

## 2024-11-20 LAB — GLUCOSE, CAPILLARY: Glucose-Capillary: 249 mg/dL — ABNORMAL HIGH (ref 70–99)

## 2024-11-20 MED ORDER — ACETAMINOPHEN 325 MG PO TABS: 650.0000 mg | ORAL_TABLET | Freq: Four times a day (QID) | ORAL | Status: DC | PRN

## 2024-11-20 MED ORDER — SENNA 8.6 MG PO TABS
1.0000 | ORAL_TABLET | Freq: Two times a day (BID) | ORAL | Status: DC
  Administered 2024-11-21: 8.6 mg via ORAL
  Filled 2024-11-20 (×3): qty 1

## 2024-11-20 MED ORDER — FERROUS SULFATE 325 (65 FE) MG PO TABS
325.0000 mg | ORAL_TABLET | Freq: Every day | ORAL | Status: DC
  Administered 2024-11-21 – 2024-11-22 (×2): 325 mg via ORAL
  Filled 2024-11-20 (×2): qty 1

## 2024-11-20 MED ORDER — LEVOTHYROXINE SODIUM 50 MCG PO TABS
75.0000 ug | ORAL_TABLET | Freq: Every day | ORAL | Status: DC
  Administered 2024-11-21 – 2024-11-22 (×2): 75 ug via ORAL
  Filled 2024-11-20 (×2): qty 1

## 2024-11-20 MED ORDER — SODIUM CHLORIDE 0.9 % IV BOLUS
500.0000 mL | Freq: Once | INTRAVENOUS | Status: AC
  Administered 2024-11-20: 500 mL via INTRAVENOUS

## 2024-11-20 MED ORDER — INSULIN ASPART PROT & ASPART (70-30 MIX) 100 UNIT/ML PEN
9.0000 [IU] | PEN_INJECTOR | Freq: Every day | SUBCUTANEOUS | Status: DC
  Administered 2024-11-21 – 2024-11-22 (×2): 9 [IU] via SUBCUTANEOUS
  Filled 2024-11-20: qty 3

## 2024-11-20 MED ORDER — TAMSULOSIN HCL 0.4 MG PO CAPS: 0.8000 mg | ORAL_CAPSULE | Freq: Every day | ORAL | Status: DC

## 2024-11-20 MED ORDER — ROSUVASTATIN CALCIUM 10 MG PO TABS
5.0000 mg | ORAL_TABLET | ORAL | Status: DC
  Administered 2024-11-21: 5 mg via ORAL
  Filled 2024-11-20: qty 1

## 2024-11-20 MED ORDER — VITAMIN D 25 MCG (1000 UNIT) PO TABS
2000.0000 [IU] | ORAL_TABLET | Freq: Every day | ORAL | Status: DC
  Administered 2024-11-21 – 2024-11-22 (×2): 2000 [IU] via ORAL
  Filled 2024-11-20 (×2): qty 2

## 2024-11-20 MED ORDER — HYDRALAZINE HCL 20 MG/ML IJ SOLN: 5.0000 mg | INTRAMUSCULAR | Status: DC | PRN

## 2024-11-20 MED ORDER — FINASTERIDE 5 MG PO TABS
5.0000 mg | ORAL_TABLET | Freq: Every day | ORAL | Status: DC
  Administered 2024-11-21 – 2024-11-22 (×2): 5 mg via ORAL
  Filled 2024-11-20 (×2): qty 1

## 2024-11-20 MED ORDER — OXYCODONE-ACETAMINOPHEN 5-325 MG PO TABS
1.0000 | ORAL_TABLET | ORAL | Status: DC | PRN
  Administered 2024-11-21: 1 via ORAL
  Filled 2024-11-20: qty 1

## 2024-11-20 MED ORDER — DIPHENHYDRAMINE HCL 50 MG/ML IJ SOLN: 12.5000 mg | Freq: Three times a day (TID) | INTRAMUSCULAR | Status: DC | PRN

## 2024-11-20 MED ORDER — INSULIN ASPART 100 UNIT/ML IJ SOLN
0.0000 [IU] | Freq: Three times a day (TID) | INTRAMUSCULAR | Status: DC
  Administered 2024-11-21: 1 [IU] via SUBCUTANEOUS
  Administered 2024-11-21: 5 [IU] via SUBCUTANEOUS
  Administered 2024-11-21 – 2024-11-22 (×2): 1 [IU] via SUBCUTANEOUS
  Filled 2024-11-20: qty 1
  Filled 2024-11-20: qty 5
  Filled 2024-11-20 (×2): qty 1

## 2024-11-20 MED ORDER — ASPIRIN 81 MG PO TBEC
81.0000 mg | DELAYED_RELEASE_TABLET | Freq: Every day | ORAL | Status: DC
  Administered 2024-11-21 – 2024-11-22 (×2): 81 mg via ORAL
  Filled 2024-11-20 (×2): qty 1

## 2024-11-20 MED ORDER — INSULIN ASPART 100 UNIT/ML IJ SOLN
0.0000 [IU] | Freq: Every day | INTRAMUSCULAR | Status: DC
  Administered 2024-11-20: 2 [IU] via SUBCUTANEOUS
  Filled 2024-11-20: qty 2

## 2024-11-20 MED ORDER — LIDOCAINE 5 % EX PTCH
1.0000 | MEDICATED_PATCH | CUTANEOUS | Status: DC
  Administered 2024-11-20 – 2024-11-21 (×2): 1 via TRANSDERMAL
  Filled 2024-11-20 (×3): qty 1

## 2024-11-20 MED ORDER — ENOXAPARIN SODIUM 30 MG/0.3ML IJ SOSY
30.0000 mg | PREFILLED_SYRINGE | Freq: Every day | INTRAMUSCULAR | Status: DC
  Administered 2024-11-20 – 2024-11-21 (×2): 30 mg via SUBCUTANEOUS
  Filled 2024-11-20 (×2): qty 0.3

## 2024-11-20 MED ORDER — SODIUM CHLORIDE 0.9 % IV BOLUS
1000.0000 mL | Freq: Once | INTRAVENOUS | Status: AC
  Administered 2024-11-20: 1000 mL via INTRAVENOUS

## 2024-11-20 MED ORDER — ACETAMINOPHEN 500 MG PO TABS
1000.0000 mg | ORAL_TABLET | Freq: Once | ORAL | Status: AC
  Administered 2024-11-20: 1000 mg via ORAL
  Filled 2024-11-20: qty 2

## 2024-11-20 NOTE — ED Provider Notes (Addendum)
 [facility] Provider Note    Event Date/Time   First MD Initiated Contact with Patient 11/20/24 1609     (approximate)   History   Fall   HPI  David Henry is a 89 y.o. male history of CAD, CKD, diabetes, GERD, hypertension, hyperlipidemia, presenting with syncopal episode.  States that he ate breakfast today but did not eat lunch, went to take a hot shower and after shower felt lightheaded, thinks he lowered himself to the ground.  But does not know how he sustained superficial laceration to his right forehead.  Unable to clearly say what happened with passing out in the fall.  He denies any pain to his extremities, does note some aching to his anterior thoracic cage.  States that he was not feeling any chest pain or shortness of breath or headache prior to the fall.  Is ambulatory.  Lives at home with wife.  He denies any cough, no fever, no nausea vomiting or diarrhea or urinary symptoms.    Per independent history from daughter, she is concerned that he passed out but does not remember what happened.  On independent chart review, he is on baby aspirin  but no other anticoagulation.  He was seen in October for his diabetes with endocrinology, has had episodes of hypoglycemia, daughter had mentioned that he will starve himself as his sugars are high.     Physical Exam   Triage Vital Signs: ED Triage Vitals  Encounter Vitals Group     BP 11/20/24 1509 (!) 108/57     Girls Systolic BP Percentile --      Girls Diastolic BP Percentile --      Boys Systolic BP Percentile --      Boys Diastolic BP Percentile --      Pulse Rate 11/20/24 1509 74     Resp 11/20/24 1509 18     Temp 11/20/24 1509 98 F (36.7 C)     Temp src --      SpO2 11/20/24 1509 100 %     Weight 11/20/24 1509 140 lb (63.5 kg)     Height 11/20/24 1509 5' 10 (1.778 m)     Head Circumference --      Peak Flow --      Pain Score 11/20/24 1506 0     Pain Loc --      Pain Education --       Exclude from Growth Chart --     Most recent vital signs: Vitals:   11/20/24 1509  BP: (!) 108/57  Pulse: 74  Resp: 18  Temp: 98 F (36.7 C)  SpO2: 100%     General: Awake, no distress.  CV:  Good peripheral perfusion.  Resp:  Normal effort.  Mild tenderness to anterior thoracic cage, no tachypnea or respiratory distress Abd:  No distention.  Soft nontender Other:  He has a superficial laceration to his right forehead that is not open, no active bleeding.  No other palpable skull deformities or tenderness, no facial deformities or tenderness, he is moving all 4 extremities without focal weakness or numbness, no bony tenderness, no midline spinal tenderness.  He has small skin tear to the left elbow.   ED Results / Procedures / Treatments   Labs (all labs ordered are listed, but only abnormal results are displayed) Labs Reviewed  COMPREHENSIVE METABOLIC PANEL WITH GFR - Abnormal; Notable for the following components:      Result Value  Glucose, Bld 192 (*)    BUN 32 (*)    Creatinine, Ser 1.73 (*)    ALT 56 (*)    GFR, Estimated 36 (*)    All other components within normal limits  CBC - Abnormal; Notable for the following components:   WBC 13.3 (*)    RBC 3.48 (*)    Hemoglobin 11.8 (*)    HCT 36.0 (*)    MCV 103.4 (*)    All other components within normal limits  URINALYSIS, ROUTINE W REFLEX MICROSCOPIC - Abnormal; Notable for the following components:   Color, Urine YELLOW (*)    APPearance CLEAR (*)    All other components within normal limits  PRO BRAIN NATRIURETIC PEPTIDE - Abnormal; Notable for the following components:   Pro Brain Natriuretic Peptide 673.0 (*)    All other components within normal limits  TROPONIN T, HIGH SENSITIVITY - Abnormal; Notable for the following components:   Troponin T High Sensitivity 61 (*)    All other components within normal limits  TROPONIN T, HIGH SENSITIVITY - Abnormal; Notable for the following components:    Troponin T High Sensitivity 59 (*)    All other components within normal limits  RESP PANEL BY RT-PCR (RSV, FLU A&B, COVID)  RVPGX2  MAGNESIUM   BASIC METABOLIC PANEL WITH GFR  CBC     EKG  EKG shows, sinus rhythm, rate 80, widened QRS, right bundle branch block, no obvious ischemic ST elevation, QTc is daily prolonged compared to prior.  Repeat EKG, sinus rhythm, rate of 80, chronic QRS, QTc still prolonged, right bundle branch noted that is old, no obvious ischemic ST elevation, not significantly compared to prior  RADIOLOGY On my independent interpretation, CT head without obvious intracranial hemorrhage   PROCEDURES:  Critical Care performed: No  Procedures   MEDICATIONS ORDERED IN ED: Medications  lidocaine  (LIDODERM ) 5 % 1 patch (1 patch Transdermal Patch Applied 11/20/24 1817)  diphenhydrAMINE  (BENADRYL ) injection 12.5 mg (has no administration in time range)  hydrALAZINE  (APRESOLINE ) injection 5 mg (has no administration in time range)  acetaminophen  (TYLENOL ) tablet 650 mg (has no administration in time range)  insulin  aspart (novoLOG ) injection 0-9 Units (has no administration in time range)  insulin  aspart (novoLOG ) injection 0-5 Units (has no administration in time range)  enoxaparin  (LOVENOX ) injection 30 mg (has no administration in time range)  sodium chloride  0.9 % bolus 1,000 mL (1,000 mLs Intravenous New Bag/Given 11/20/24 1752)  acetaminophen  (TYLENOL ) tablet 1,000 mg (1,000 mg Oral Given 11/20/24 1816)     IMPRESSION / MDM / ASSESSMENT AND PLAN / ED COURSE  I reviewed the triage vital signs and the nursing notes.                              Differential diagnosis includes, but is not limited to, arrhythmia, electrolyte derangements, dehydration, hypoglycemia, occult infection, fracture, contusion, strain, sprain, intracranial hemorrhage.  Will get labs, EKG, troponin, chest x-ray, CT head, cervical spine, UA.  Will give some IV fluids here.  Will  Steri-Strip the skin tear.  Patient's presentation is most consistent with acute presentation with potential threat to life or bodily function.  Independent interpretation of labs and imaging below.  QTc still persistently prolonged, this appears new compared to prior EKGs.  Wonder if this may have contributed to him passing out, he is on telemetry.  Given that he does not remember what happened with the fall and  passing out, I believe he should be admitted for high risk syncope and monitored on telemetry overnight.  Reviewed his medications list, Protonix  and Paxil  can prolong QTc.  Consult to hospitalist will admit the patient.  He is admitted.  The patient is on the cardiac monitor to evaluate for evidence of arrhythmia and/or significant heart rate changes.   Clinical Course as of 11/20/24 1946  Sun Nov 20, 2024  1758 Independent review of labs, respiratory viral panel is negative, UA is not consistent with UTI, electrolytes not severely deranged, creatinine is elevated but he has history of CKD, mild leukocytosis, H&H is stable. [TT]  1759 CT Head Wo Contrast IMPRESSION: HEAD CT:  No acute or traumatic finding. Age related volume loss. Chronic small-vessel ischemic changes of the white matter.  CERVICAL SPINE CT:  No acute or traumatic finding. Chronic degenerative changes as outlined above.   [TT]  1759 DG Ribs Unilateral W/Chest Left No radiographic finding of acute displaced fracture.  [TT]    Clinical Course User Index [TT] Waymond Lorelle Cummins, MD     FINAL CLINICAL IMPRESSION(S) / ED DIAGNOSES   Final diagnoses:  Fall, initial encounter  Chest wall pain  Syncope, unspecified syncope type  Laceration of forehead, initial encounter  Prolonged Q-T interval on ECG     Rx / DC Orders   ED Discharge Orders     None        Note:  This document was prepared using Dragon voice recognition software and may include unintentional dictation errors.    Waymond Lorelle Cummins,  MD 11/20/24 CATHLYN    Waymond Lorelle Cummins, MD 11/20/24 [phone]

## 2024-11-20 NOTE — H&P (Signed)
 History and Physical    MCKINLEY OLHEISER FMW:[id number] DOB: 11/27/1926 DOA: 11/20/2024  Referring MD/NP/PA:   PCP: Randeen Laine LABOR, MD   Patient coming from:  The patient is coming from home.     Chief Complaint: syncope  HPI: David Henry is a 89 y.o. male with medical history significant of HTN, HLD, DM, PVD, CAD with stent, dCHF, hypothyroidism, GERD, BPH, CKD-3b, anemia, kidney stone, who presents with syncope.  Per his daughter at the bedside, pt felt dizzy and possibly passed out after getting out of the shower this afternoon.  Patient does not remember exactly what happened to him. He has a small skin laceration over right forehead and skin laceration over left elbow.  He complains of right great toe pain which is constant, severe, sharp, nonradiating, aggravated by movement. He has little bleeding in under toe nail.  Pt has also right lateral rib cage pain and flank pian which is constant, sharp, nonradiating, aggravated by deep breath. Patient does not have cough and SOB.  No nausea, vomiting, diarrhea or abdominal pain.  No symptoms UTI. Patient does not have unilateral numbness or tingling in extremities.  No facial droop or slurred speech. Per her daughter, patient had soft blood pressure of 90 over 50s.    Data reviewed independently and ED Course: pt was found to have WBC 13.3, worsening renal function, negative UA, negative PCR for COVID, flu and RSV, trop 61 --> 59, proBNP 670.  Temperature normal, blood pressure 108/57, heart rate 74, RR 18, oxygen saturation 100% on room air.  CT of head negative for acute intracranial abnormalities.  CT of C-spine negative for acute injury, but showed degenerative disc disease.  X-ray of left ribs/chest negative for left rib fracture.  X-ray of left elbow negative for fracture.  Pt is placed in telemetry bed for observation.  X-ray of right great toe: Acute nondisplaced fracture through the proximal aspect of the first distal  phalanx.  X-ray of right rib:   1. No acute displaced right rib fracture. 2. 8 mm calcification projecting to the right of L2, suspicious for right renal calculus; consider clinical correlation for renal colic and dedicated imaging if indicated.  CT-renal stone:  ***    EKG: I have personally reviewed.  Sinus rhythm, QTc 516, bifascicular block, poor R wave progression.  Review of Systems:   General: no fevers, chills, no body weight gain,  has fatigue HEENT: no blurry vision, hearing changes or sore throat Respiratory: no dyspnea, coughing, wheezing CV: no chest pain, no palpitations GI: no nausea, vomiting, abdominal pain, diarrhea, constipation GU: no dysuria, burning on urination, increased urinary frequency, hematuria  Ext: no leg edema Neuro: no unilateral weakness, numbness, or tingling, no vision change or hearing loss. Has syncope Skin: has skin tear in right forehead and left elbow MSK: Has pain in right great toe and right lateral rib cage and flank area Heme: No easy bruising.  Travel history: No recent long distant travel.   Allergy:  Allergies  Allergen Reactions   Adhesive [Tape] Dermatitis    Skin is thin so it is tough to take off.  Use paper tape   Azithromycin Nausea Only    REACTION: nausea   Celecoxib Other (See Comments)    Raises blood pressure   Zocor  [Simvastatin ] Other (See Comments)    Muscle pain    Past Medical History:  Diagnosis Date   Arthritis    Basal cell carcinoma 08/15/2021   right cheek,  EDC   CAD (coronary artery disease)    3 stents   Colon polyp    Diabetes mellitus    type II   Dyspnea    with exertion   GERD (gastroesophageal reflux disease)    Hyperlipidemia    Hypertension    Hypothyroidism    Kidney stones    stage III CKD   Neuropathy    Peripheral vascular disease    neuropathy in feet    Past Surgical History:  Procedure Laterality Date   APPENDECTOMY  1984   CATARACT EXTRACTION Bilateral 04/2001    CHOLECYSTECTOMY  1989   COLONOSCOPY N/A 2/14   hemorrhoids and tics (after pos ifob card)   CORONARY ANGIOPLASTY  2006   3 stents   CYSTOSCOPY/URETEROSCOPY/HOLMIUM LASER/STENT PLACEMENT Left 03/24/2021   Procedure: CYSTOSCOPY/URETEROSCOPY, RETROGRADE AND /STENT PLACEMENT;  Surgeon: Twylla Glendia BROCKS, MD;  Location: [facility];  Service: Urology;  Laterality: Left;   ESOPHAGOGASTRODUODENOSCOPY N/A 2/14   RETINAL DETACHMENT SURGERY Right 2003   SHOULDER ARTHROSCOPY WITH ROTATOR CUFF REPAIR AND SUBACROMIAL DECOMPRESSION Right 12/30/2017   Procedure: SHOULDER ARTHROSCOPY WITH ROTATOR CUFF REPAIR AND SUBACROMIAL DECOMPRESSION;  Surgeon: Maryl Barters, MD;  Location: [facility];  Service: Orthopedics;  Laterality: Right;   SHOULDER SURGERY  2012   Lt shoulder repair    Social History:  reports that he has never smoked. He has never been exposed to tobacco smoke. He has never used smokeless tobacco. He reports that he does not drink alcohol and does not use drugs.  Family History:  Family History  Problem Relation Age of Onset   Cancer Brother        prostate CA   Heart disease Mother    Stroke Father    Kidney disease Sister    Kidney disease Sister    Clotting disorder Sister    COPD Brother    Heart disease Brother      Prior to Admission medications   Medication Sig Start Date End Date Taking? Authorizing Provider  acetaminophen  (TYLENOL ) 650 MG CR tablet Take 650 mg by mouth every 8 (eight) hours as needed.    [provider]  amLODipine  (NORVASC ) 5 MG tablet Take 0.5 tablets (2.5 mg total) by mouth daily. 03/16/23   Tower, Laine LABOR, MD  aspirin  81 MG tablet Take 81 mg by mouth daily.    [provider]  Continuous Blood Gluc Receiver (FREESTYLE LIBRE 3 READER) DEVI 1 each by Does not apply route as directed. Use with sensors to monitor glucose continuously 03/23/23   Tower, Laine LABOR, MD  Continuous Blood Gluc Sensor (FREESTYLE LIBRE 3 SENSOR) MISC Place 1 sensor on the skin  every 14 days. Use to check glucose continuously 03/23/23   Tower, Laine LABOR, MD  FEROSUL 325 (65 Fe) MG tablet TAKE ONE TABLET BY MOUTH ONCE A DAY WITH FOOD. 12/30/23   Tower, Laine LABOR, MD  finasteride  (PROSCAR ) 5 MG tablet Take 1 tablet (5 mg total) by mouth daily. 07/27/24   Helon Kirsch A, PA-C  glimepiride  (AMARYL ) 4 MG tablet Take 0.5 tablets (2 mg total) by mouth daily with breakfast. 05/20/23   Tower, Laine LABOR, MD  levothyroxine  (SYNTHROID ) 75 MCG tablet TAKE ONE TABLET (75 MCG TOTAL) BY MOUTH DAILY BEFORE BREAKFAST. 09/06/24   Tower, Laine LABOR, MD  pantoprazole  (PROTONIX ) 40 MG tablet TAKE ONE TABLET BY MOUTH ONCE A DAY 10/20/24   Tower, Laine LABOR, MD  PARoxetine  (PAXIL ) 10 MG tablet TAKE ONE TABLET (10 MG  TOTAL) BY MOUTH DAILY. Patient not taking: Reported on 07/27/2024 10/22/23   Tower, Laine LABOR, MD  rosuvastatin  (CRESTOR ) 5 MG tablet TAKE ONE TABLET BY MOUTH EVERY OTHER DAY 04/19/24   Tower, Laine LABOR, MD  Semaglutide,0.25 or 0.5MG /DOS, 2 MG/3ML SOPN Inject into the skin. 08/06/23   [provider]  senna (SENOKOT) 8.6 MG tablet Take 1 tablet by mouth 2 (two) times daily.    [provider]  tamsulosin  (FLOMAX ) 0.4 MG CAPS capsule Take 2 capsules (0.8 mg total) by mouth daily. 07/27/24   Helon Kirsch A, PA-C  torsemide (DEMADEX) 10 MG tablet Take 10 mg by mouth daily.    Dennise Capri, MD  VITAMIN D  PO Take 2,000 Units by mouth daily.    [provider]  omeprazole  (PRILOSEC  OTC) 20 MG tablet Take 1 tablet (20 mg total) by mouth daily. 12/06/13 06/14/14  Randeen Laine LABOR, MD    Physical Exam: Vitals:   11/20/24 1509 11/20/24 2107 11/20/24 2142 11/20/24 2217  BP:  134/65 (!) 155/107   Pulse:  92 88   Resp:  (!) 21 18   Temp:  97.9 F (36.6 C) 97.6 F (36.4 C)   TempSrc:  Oral Oral   SpO2:  100% 99%   Weight: 63.5 kg   65 kg  Height: 5' 10 (1.778 m)   5' 7 (1.702 m)   General: Not in acute distress HEENT:       Eyes: PERRL, EOMI, no jaundice       ENT: No  discharge from the ears and nose, no pharynx injection, no tonsillar enlargement.        Neck: No JVD, no bruit, no mass felt. Heme: No neck lymph node enlargement. Cardiac: S1/S2, RRR, No murmurs, No gallops or rubs. Respiratory: No rales, wheezing, rhonchi or rubs. GI: Soft, nondistended, nontender, no rebound pain, no organomegaly, BS present. GU: No hematuria Ext: No pitting leg edema bilaterally. 1+DP/PT pulse bilaterally. Skin: has skin tear in right forehead and left elbow MSK: Has tenderness in right toe and right lateral rib cage Neuro: Alert, oriented X3, cranial nerves II-XII grossly intact, moves all extremities normally. Muscle strength 5/5 in all extremities, sensation to light touch intact. Brachial reflex  Psych: Patient is not psychotic, no suicidal or hemocidal ideation.  Labs on Admission: I have personally reviewed following labs and imaging studies  CBC: Recent Labs  Lab 11/20/24 1509  WBC 13.3*  HGB 11.8*  HCT 36.0*  MCV 103.4*  PLT 198   Basic Metabolic Panel: Recent Labs  Lab 11/20/24 1509 11/20/24 1821  NA 142  --   K 4.4  --   CL 106  --   CO2 25  --   GLUCOSE 192*  --   BUN 32*  --   CREATININE 1.73*  --   CALCIUM  9.7  --   MG  --  1.9   GFR: Estimated Creatinine Clearance: 23 mL/min (A) (by C-G formula based on SCr of 1.73 mg/dL (H)). Liver Function Tests: Recent Labs  Lab 11/20/24 1509  AST 37  ALT 56*  ALKPHOS 112  BILITOT 0.7  PROT 6.6  ALBUMIN 4.1   No results for input(s): LIPASE, AMYLASE in the last 168 hours. No results for input(s): AMMONIA in the last 168 hours. Coagulation Profile: No results for input(s): INR, PROTIME in the last 168 hours. Cardiac Enzymes: No results for input(s): CKTOTAL, CKMB, CKMBINDEX, TROPONINI in the last 168 hours. BNP (last 3 results) Recent Labs  11/20/24 1821  PROBNP 673.0*   HbA1C: No results for input(s): HGBA1C in the last 72 hours. CBG: Recent Labs  Lab  11/20/24 2112  GLUCAP 282*   Lipid Profile: No results for input(s): CHOL, HDL, LDLCALC, TRIG, CHOLHDL, LDLDIRECT in the last 72 hours. Thyroid  Function Tests: No results for input(s): TSH, T4TOTAL, FREET4, T3FREE, THYROIDAB in the last 72 hours. Anemia Panel: No results for input(s): VITAMINB12, FOLATE, FERRITIN, TIBC, IRON , RETICCTPCT in the last 72 hours. Urine analysis:    Component Value Date/Time   COLORURINE YELLOW (A) 11/20/2024 1730   APPEARANCEUR CLEAR (A) 11/20/2024 1730   LABSPEC 1.011 11/20/2024 1730   PHURINE 5.0 11/20/2024 1730   GLUCOSEU NEGATIVE 11/20/2024 1730   GLUCOSEU NEGATIVE 06/23/2013 0822   HGBUR NEGATIVE 11/20/2024 1730   BILIRUBINUR NEGATIVE 11/20/2024 1730   BILIRUBINUR Negative 09/23/2023 1248   KETONESUR NEGATIVE 11/20/2024 1730   PROTEINUR NEGATIVE 11/20/2024 1730   UROBILINOGEN 0.2 09/23/2023 1248   UROBILINOGEN 0.2 06/23/2013 0822   NITRITE NEGATIVE 11/20/2024 1730   LEUKOCYTESUR NEGATIVE 11/20/2024 1730   Sepsis Labs: @LABRCNTIP (procalcitonin:4,lacticidven:4) ) Recent Results (from the past 240 hours)  Resp panel by RT-PCR (RSV, Flu A&B, Covid) Anterior Nasal Swab     Status: None   Collection Time: 11/20/24  3:12 PM   Specimen: Anterior Nasal Swab  Result Value Ref Range Status   SARS Coronavirus 2 by RT PCR NEGATIVE NEGATIVE Final    Comment: (NOTE) SARS-CoV-2 target nucleic acids are NOT DETECTED.  The SARS-CoV-2 RNA is generally detectable in upper respiratory specimens during the acute phase of infection. The lowest concentration of SARS-CoV-2 viral copies this assay can detect is 138 copies/mL. A negative result does not preclude SARS-Cov-2 infection and should not be used as the sole basis for treatment or other patient management decisions. A negative result may occur with  improper specimen collection/handling, submission of specimen other than nasopharyngeal swab, presence of viral  mutation(s) within the areas targeted by this assay, and inadequate number of viral copies(<138 copies/mL). A negative result must be combined with clinical observations, patient history, and epidemiological information. The expected result is Negative.  Fact Sheet for Patients:  bloggercourse.com  Fact Sheet for Healthcare Providers:  seriousbroker.it  This test is no t yet approved or cleared by the [place]  FDA and  has been authorized for detection and/or diagnosis of SARS-CoV-2 by FDA under an Emergency Use Authorization (EUA). This EUA will remain  in effect (meaning this test can be used) for the duration of the COVID-19 declaration under Section 564(b)(1) of the Act, 21 U.S.C.section 360bbb-3(b)(1), unless the authorization is terminated  or revoked sooner.       Influenza A by PCR NEGATIVE NEGATIVE Final   Influenza B by PCR NEGATIVE NEGATIVE Final    Comment: (NOTE) The Xpert Xpress SARS-CoV-2/FLU/RSV plus assay is intended as an aid in the diagnosis of influenza from Nasopharyngeal swab specimens and should not be used as a sole basis for treatment. Nasal washings and aspirates are unacceptable for Xpert Xpress SARS-CoV-2/FLU/RSV testing.  Fact Sheet for Patients: bloggercourse.com  Fact Sheet for Healthcare Providers: seriousbroker.it  This test is not yet approved or cleared by the [place]  FDA and has been authorized for detection and/or diagnosis of SARS-CoV-2 by FDA under an Emergency Use Authorization (EUA). This EUA will remain in effect (meaning this test can be used) for the duration of the COVID-19 declaration under Section 564(b)(1) of the Act, 21 U.S.C. section 360bbb-3(b)(1), unless the authorization is  terminated or revoked.     Resp Syncytial Virus by PCR NEGATIVE NEGATIVE Final    Comment: (NOTE) Fact Sheet for  Patients: bloggercourse.com  Fact Sheet for Healthcare Providers: seriousbroker.it  This test is not yet approved or cleared by the [place]  FDA and has been authorized for detection and/or diagnosis of SARS-CoV-2 by FDA under an Emergency Use Authorization (EUA). This EUA will remain in effect (meaning this test can be used) for the duration of the COVID-19 declaration under Section 564(b)(1) of the Act, 21 U.S.C. section 360bbb-3(b)(1), unless the authorization is terminated or revoked.  Performed at [facility], [address] [place], [place] [address]      Radiological Exams on Admission:   Assessment/Plan Principal Problem:   Syncope Active Problems:   Fall at home, initial encounter   Myocardial injury   CAD (coronary artery disease)   Chronic diastolic CHF (congestive heart failure) (HCC)   Prolonged QT interval   Leukocytosis   Hyperlipidemia associated with type 2 diabetes mellitus (HCC)   Chronic kidney disease, stage 3b (HCC)   Type II diabetes mellitus with renal manifestations (HCC)   BPH (benign prostatic hyperplasia)   Hypothyroid   Macrocytic anemia   Closed fracture of great toe of right foot   Rib pain on right side   Depression   Assessment and Plan:  Syncope:  Etiology is not clear. The differential diagnosis is broad, including vasovagal syncope, orthostatic status, dehydration, cardiac arrhythmia.  No focal neurodeficit on physical examination, low suspicions for stroke.  CT head negative.  - Place in tele bed for for obs - Orthostatic vital signs  - 2d echo - Frequent neuro checks  - IVF: 1.5 of NS - Hold torsemide - Hold Flomax   Fall at home, initial encounter - fall precaution - PT/OT eval and treat  Myocardial injury and hx of CAD: trop  61 --> 59.  Likely demand ischemia. - Aspirin , Crestor  - Trend troponin  Chronic diastolic CHF (congestive heart failure)  (HCC): 2D echo on 02/02/2020 showed EF> 55% with grade 1 diastolic dysfunction.  Patient does not have leg edema, proBNP 670.  CHF seem to be compensated.  Prolonged QT interval: QTc 516 -Check magnesium  level- > 1.9 - Hold Protonix  - Hold Paxil  (patient not taking his medications currently. - Avoid using QT prolonging medications, such as Zofran .  Use as needed Benadryl for nausea vomiting  Leukocytosis: WBC 13.3, no fever, no source of infection identified, likely reactive. -Follow-up with CBC  Hyperlipidemia associated with type 2 diabetes mellitus (HCC) -Crestor   Chronic kidney disease, stage 3b (HCC): Renal function is worse than baseline.  Recent baseline creatinine 1.37.  He creatinine is 1.73, BUN 32, GFR 36. -Hold torsemide, lisinopril  - IV fluid as above  Type II diabetes mellitus with renal manifestations [facility]): Patient is taking Amaryl  2 mg daily and 70/30 insulin  9 units every morning -SSI - 70/30 insulin  9 unit every morning  BPH (benign prostatic hyperplasia) -Continue Proscar  - Primarily hold Flomax  due to syncope and possible orthostatic status  Hypothyroid -Synthroid   Macrocytic anemia: Hemoglobin stable 11.8 (11.4 on 10/03/2024) - Continue home iron  supplement - Follow-up with CBC  Closed fracture of great toe of right foot: Nondisplaced. -Pain control: As needed Tylenol  and Percocet - Apply brace  Rib pain on right side: X-ray negative for rib fracture. -Incentive spirometry - Lidoderm  patch - As needed Tylenol  and Percocet  Depression: Patient is not taking Paxil  currently. - Hold Paxil  due to QTc  prolongation  Abnormal finding of X-ray of right rib:  it showed a 8 mm calcification projecting to the right of L2, suspicious for right renal calculus - f/u CT-renal stone   ***      DVT ppx: SQ Lovenox  Code Status: DNR per pt and his daughter  Family Communication:     not done, no family member is at bed side.     Disposition Plan:   Anticipate discharge back to previous environment  Consults called:  none  Admission status and Level of care: Telemetry:    for obs    Dispo: The patient is from: Home              Anticipated d/c is to: Home              Anticipated d/c date is: 1 day              Patient currently is not medically stable to d/c.    Severity of Illness:  The appropriate patient status for this patient is OBSERVATION. Observation status is judged to be reasonable and necessary in order to provide the required intensity of service to ensure the patient's safety. The patient's presenting symptoms, physical exam findings, and initial radiographic and laboratory data in the context of their medical condition is felt to place them at decreased risk for further clinical deterioration. Furthermore, it is anticipated that the patient will be medically stable for discharge from the hospital within 2 midnights of admission.        Date of Service 11/20/2024    Caleb Exon [facility]   If 7PM-7AM, please contact night-coverage [email or web address] 11/20/2024, 10:46 PM

## 2024-11-20 NOTE — ED Triage Notes (Addendum)
 Pt comes with fall while getting out of shower. Pt states left sided rib pain and head. Pt has laceration noted to forehead bleeding controlled.   Pt states he felt like he was going to pass out and he laid down  but not sure if he passed out first and then fell. Pt states some dizziness. Pt states he has had runny nose too.

## 2024-11-20 NOTE — Progress Notes (Signed)
 PHARMACIST - PHYSICIAN COMMUNICATION  CONCERNING:  Enoxaparin (Lovenox) for DVT Prophylaxis    RECOMMENDATION: Patient was prescribed enoxaprin 40mg  q24 hours for VTE prophylaxis.   Filed Weights   11/20/24 1509  Weight: 63.5 kg (140 lb)    Body mass index is 20.09 kg/m.  Estimated Creatinine Clearance: 22.4 mL/min (A) (by C-G formula based on SCr of 1.73 mg/dL (H)).   Patient is candidate for enoxaparin 30mg  every 24 hours based on CrCl <65ml/min or Weight <45kg  DESCRIPTION: Pharmacy has adjusted enoxaparin dose per [facility] policy.  Patient is now receiving enoxaparin 30 mg every 24 hours    David Henry, PharmD Clinical Pharmacist  11/20/2024 6:44 PM

## 2024-11-21 ENCOUNTER — Observation Stay: Admit: 2024-11-21 | Discharge: 2024-11-21 | Disposition: A | Attending: Internal Medicine

## 2024-11-21 DIAGNOSIS — I5032 Chronic diastolic (congestive) heart failure: Secondary | ICD-10-CM | POA: Diagnosis not present

## 2024-11-21 DIAGNOSIS — R9431 Abnormal electrocardiogram [ECG] [EKG]: Secondary | ICD-10-CM | POA: Diagnosis not present

## 2024-11-21 DIAGNOSIS — E1122 Type 2 diabetes mellitus with diabetic chronic kidney disease: Secondary | ICD-10-CM

## 2024-11-21 DIAGNOSIS — Z794 Long term (current) use of insulin: Secondary | ICD-10-CM

## 2024-11-21 DIAGNOSIS — K5732 Diverticulitis of large intestine without perforation or abscess without bleeding: Secondary | ICD-10-CM | POA: Diagnosis present

## 2024-11-21 DIAGNOSIS — I5A Non-ischemic myocardial injury (non-traumatic): Secondary | ICD-10-CM | POA: Diagnosis not present

## 2024-11-21 DIAGNOSIS — W19XXXA Unspecified fall, initial encounter: Secondary | ICD-10-CM | POA: Diagnosis not present

## 2024-11-21 DIAGNOSIS — E1169 Type 2 diabetes mellitus with other specified complication: Secondary | ICD-10-CM | POA: Diagnosis not present

## 2024-11-21 DIAGNOSIS — R0789 Other chest pain: Secondary | ICD-10-CM | POA: Diagnosis not present

## 2024-11-21 DIAGNOSIS — S0181XA Laceration without foreign body of other part of head, initial encounter: Secondary | ICD-10-CM | POA: Diagnosis not present

## 2024-11-21 DIAGNOSIS — Y92009 Unspecified place in unspecified non-institutional (private) residence as the place of occurrence of the external cause: Secondary | ICD-10-CM

## 2024-11-21 DIAGNOSIS — N401 Enlarged prostate with lower urinary tract symptoms: Secondary | ICD-10-CM

## 2024-11-21 DIAGNOSIS — S2241XA Multiple fractures of ribs, right side, initial encounter for closed fracture: Secondary | ICD-10-CM

## 2024-11-21 DIAGNOSIS — R55 Syncope and collapse: Secondary | ICD-10-CM | POA: Diagnosis not present

## 2024-11-21 DIAGNOSIS — N1832 Chronic kidney disease, stage 3b: Secondary | ICD-10-CM | POA: Diagnosis not present

## 2024-11-21 DIAGNOSIS — S92424A Nondisplaced fracture of distal phalanx of right great toe, initial encounter for closed fracture: Secondary | ICD-10-CM

## 2024-11-21 LAB — CBC
HCT: 30.5 % — ABNORMAL LOW (ref 39.0–52.0)
Hemoglobin: 10.1 g/dL — ABNORMAL LOW (ref 13.0–17.0)
MCH: 33.9 pg (ref 26.0–34.0)
MCHC: 33.1 g/dL (ref 30.0–36.0)
MCV: 102.3 fL — ABNORMAL HIGH (ref 80.0–100.0)
Platelets: 177 K/uL (ref 150–400)
RBC: 2.98 MIL/uL — ABNORMAL LOW (ref 4.22–5.81)
RDW: 14.5 % (ref 11.5–15.5)
WBC: 10.1 K/uL (ref 4.0–10.5)
nRBC: 0 % (ref 0.0–0.2)

## 2024-11-21 LAB — ECHOCARDIOGRAM COMPLETE
AR max vel: 2.56 cm2
AV Area VTI: 2.71 cm2
AV Area mean vel: 2.54 cm2
AV Mean grad: 3 mmHg
AV Peak grad: 6.4 mmHg
Ao pk vel: 1.26 m/s
Area-P 1/2: 3.12 cm2
Calc EF: 42.6 %
Height: 67 in
MV VTI: 3.09 cm2
S' Lateral: 3.6 cm
Single Plane A2C EF: 34.2 %
Single Plane A4C EF: 55.1 %
Weight: 2292.78 [oz_av]

## 2024-11-21 LAB — GLUCOSE, CAPILLARY
Glucose-Capillary: 129 mg/dL — ABNORMAL HIGH (ref 70–99)
Glucose-Capillary: 254 mg/dL — ABNORMAL HIGH (ref 70–99)
Glucose-Capillary: 123 mg/dL — ABNORMAL HIGH (ref 70–99)
Glucose-Capillary: 128 mg/dL — ABNORMAL HIGH (ref 70–99)

## 2024-11-21 LAB — TROPONIN T, HIGH SENSITIVITY: Troponin T High Sensitivity: 62 ng/L — ABNORMAL HIGH (ref 0–19)

## 2024-11-21 LAB — BASIC METABOLIC PANEL WITH GFR
Anion gap: 10 (ref 5–15)
BUN: 30 mg/dL — ABNORMAL HIGH (ref 8–23)
CO2: 26 mmol/L (ref 22–32)
Calcium: 9.1 mg/dL (ref 8.9–10.3)
Chloride: 109 mmol/L (ref 98–111)
Creatinine, Ser: 1.95 mg/dL — ABNORMAL HIGH (ref 0.61–1.24)
GFR, Estimated: 31 mL/min — ABNORMAL LOW (ref >60)
Glucose, Bld: 176 mg/dL — ABNORMAL HIGH (ref 70–99)
Potassium: 4.3 mmol/L (ref 3.5–5.1)
Sodium: 144 mmol/L (ref 135–145)

## 2024-11-21 MED ORDER — PIPERACILLIN-TAZOBACTAM 3.375 G IVPB
3.3750 g | Freq: Three times a day (TID) | INTRAVENOUS | Status: DC
  Administered 2024-11-21 – 2024-11-22 (×4): 3.375 g via INTRAVENOUS
  Filled 2024-11-21 (×4): qty 50

## 2024-11-21 MED ORDER — TAMSULOSIN HCL 0.4 MG PO CAPS
0.8000 mg | ORAL_CAPSULE | Freq: Every day | ORAL | Status: DC
  Administered 2024-11-21 – 2024-11-22 (×2): 0.8 mg via ORAL
  Filled 2024-11-21 (×2): qty 2

## 2024-11-21 MED ORDER — LACTATED RINGERS IV SOLN: INTRAVENOUS | Status: AC

## 2024-11-21 NOTE — Assessment & Plan Note (Signed)
 Continue Crestor

## 2024-11-21 NOTE — Assessment & Plan Note (Signed)
 CT abdomen concerning for mild diverticulitis with leukocytosis on admission which has been improved. Preliminary blood cultures negative -Continue with Zosyn for now -Follow-up cultures

## 2024-11-21 NOTE — Consult Note (Signed)
 WOC Nurse Consult Note: Reason for Consult: skin tear L elbow  Wound type: partial thickness L elbow post trauma  Pressure Injury POA: NA not pressure  Measurement: see nursing flowsheet  Wound bed: red moist  Drainage (amount, consistency, odor) serosanguinous  Periwound: Dressing procedure/placement/frequency: Cleanse L elbow skin tear with NS, apply Vaseline gauze ([employer]son #[id number]) to wound bed daily and secure with silicone foam or Telfa and Kerlix roll gauze whichever is preferred/works best.  SOAK DRESSING WITH NORMAL SALINE IF ADHERED TO WOUND BED FOR ATRAUMATIC REMOVAL.   POC discussed with bedside nurse. WOC team will not follow. Re-consult if further needs arise.   Thank you,    Powell Bar MSN, RN-BC, [employer]

## 2024-11-21 NOTE — TOC Initial Note (Signed)
 Transition of Care [facility]) - Initial/Assessment Note    Patient Details  Name: David Henry MRN: [id number] Date of Birth: [birth year]  Transition of [facility]) CM/SW Contact:    Daved JONETTA Hamilton, RN Phone Number: 11/21/2024, 1:26 PM  Clinical Narrative:                  Spoke with patient's daughter [name] via telephone, introduced self and explained role. [name] is at patient's bedside.   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 Discussed with [name] therapy recommendations for [facility] PT/OT once patient is medically ready for discharge. [name] verbalized that he is up and moving around well and they do not feel that he needs anyone to come to the home for continued therapy. Discussed therapy recommendation for rolling walker and [name] verbalized she has already ordered one and it will be delivered to patients home.   [name] verbalized desire for a nurse to come to the patient's home periodically to check on patient. Discussed with [name] for insurance to cover [facility] Nurse, patient would have to have a need for that level of care such as IV medication and so forth, [name] verbalized understanding. Discussed with [name] she can research Personal Care Aide's for out of pocket payment, [name] verbalized understanding. [name] verbalized that once patient is medically ready for discharge, she will transport him home.    Expected Discharge Plan: Home/Self Care Barriers to Discharge: Continued Medical Work up   Patient Goals and CMS Choice     Choice offered to / list presented to : Adult Children      Expected Discharge Plan and Services   Discharge Planning Services: CM Consult   Living arrangements for the past 2 months: Single Family Home                                      Prior Living Arrangements/Services Living arrangements for the past 2 months: Single Family Home Lives with:: Spouse Patient language and need for interpreter reviewed::  Yes Do you feel safe going back to the place where you live?: Yes      Need for Family Participation in Patient Care: Yes (Comment) Care giver support system in place?: Yes (comment)   Criminal Activity/Legal Involvement Pertinent to Current Situation/Hospitalization: No - Comment as needed  Activities of Daily Living   ADL Screening (condition at time of admission) Independently performs ADLs?: Yes (appropriate for developmental age) Is the patient deaf or have difficulty hearing?: No Does the patient have difficulty seeing, even when wearing glasses/contacts?: No Does the patient have difficulty concentrating, remembering, or making decisions?: No  Permission Sought/Granted                  Emotional Assessment       Orientation: : Oriented to Self, Oriented to Place, Oriented to  Time, Oriented to Situation Alcohol / Substance Use: Not Applicable Psych Involvement: No (comment)  Admission diagnosis:  Chest wall pain [R07.89] Syncope [R55] Prolonged Q-T interval on ECG [R94.31] Fall, initial encounter [W19.XXXA] Laceration of forehead, initial encounter [S01.81XA] Syncope, unspecified syncope type [R55] Patient Active Problem List   Diagnosis Date Noted   posible sigmoid diverticulitis 11/21/2024   Syncope 11/20/2024   Type II diabetes mellitus with renal manifestations (HCC) 11/20/2024   Macrocytic anemia 11/20/2024   Chronic diastolic CHF (congestive heart failure) (  HCC) 11/20/2024   Chronic kidney disease, stage 3b (HCC) 11/20/2024   Leukocytosis 11/20/2024   Myocardial injury 11/20/2024   CAD (coronary artery disease) 11/20/2024   Prolonged QT interval 11/20/2024   Depression 11/20/2024   Fall at home, initial encounter 11/20/2024   Closed fracture of great toe of right foot 11/20/2024   Right rib fracture 11/20/2024   BPH (benign prostatic hyperplasia) 03/17/2024   Current use of proton pump inhibitor 03/17/2024   Weight loss 09/23/2023   Poor balance  09/23/2023   Stress reaction 09/23/2023   Frequent urination 09/23/2023   Caregiver stress 09/23/2023   Plantar wart, left foot 03/16/2023   Renal stone 03/22/2021   Macrocytosis 10/05/2019   Routine general medical examination at a health care facility 03/09/2016   Constipation 09/17/2015   Encounter for Medicare annual wellness exam 12/08/2014   Chronic kidney disease (CKD) 05/24/2013   Anemia 11/23/2012   Hypothyroid 11/23/2012   GERD 11/29/2010   Coronary atherosclerosis 04/18/2007   Controlled type 2 diabetes mellitus with chronic kidney disease (HCC) 04/15/2007   Hyperlipidemia associated with type 2 diabetes mellitus (HCC) 04/15/2007   Essential hypertension 04/15/2007   MYOCARDIAL INFARCTION, HX OF 04/15/2007   ACTINIC KERATOSIS 04/15/2007   PCP:  Randeen Laine LABOR, MD Pharmacy:   [facility] [place], [place] - [address] [place] [address] Phone: [phone] Fax: [phone]     Social Drivers of Health (SDOH) Social History: SDOH Screenings   Food Insecurity: No Food Insecurity (11/20/2024)  Housing: Low Risk  (11/20/2024)  Transportation Needs: No Transportation Needs (11/20/2024)  Utilities: Not At Risk (11/20/2024)  Alcohol Screen: Low Risk  (08/21/2023)  Depression (PHQ2-9): Medium Risk (03/17/2024)  Financial Resource Strain: Low Risk  (09/29/2023)  Physical Activity: Inactive (08/21/2023)  Social Connections: Moderately Isolated (11/20/2024)  Stress: No Stress Concern Present (08/21/2023)  Tobacco Use: Low Risk  (11/20/2024)  Health Literacy: Adequate Health Literacy (08/21/2023)   SDOH Interventions: Social Connections Interventions: Inpatient TOC, Other (Comment) (Resources added to AVS)   Readmission Risk Interventions     No data to display

## 2024-11-21 NOTE — Consult Note (Signed)
 [facility] CLINIC CARDIOLOGY CONSULT NOTE       Patient ID: David Henry MRN: [id number] DOB/AGE: 1926/05/26 89 y.o.  Admit date: 11/20/2024 Referring Physician Dr. Amaryllis Dare Primary Physician Tower, Laine LABOR, MD  Primary Cardiologist Dr. Ammon Reason for Consultation syncope  HPI: David Henry is a 89 y.o. male  with a past medical history of coronary artery disease s/p PCI and stent of LAD and LCx 2006,  moderate mitral insufficiency, bradycardia, hypertension, hyperlipidemia, type 2 diabetes who presented to the ED on 11/20/2024 for syncope. Cardiology was consulted for further evaluation.   Patient states that he had a syncopal episode on Sunday. Given this decided to come to ED for evaluation. Workup in the ED notable for creatinine 1.73, potassium 4.4, hemoglobin 11.8, WBC 13.3. Troponins 61 > 59 > 59 > 62, BNP 673. EKG in the ED NSR RBBB rate 80 bpm. CT head without acute abnormality.   At the time of my evaluation today, he is sitting upright on side of hospital bed with daughter at bedside. Discussed his episode in further detail. Reports that immediately after getting out of the shower yesterday he began feeling dizzy and had an episode of syncope. Unsure how long he was unconscious. Wife found him on bathroom floor, slightly confused but reoriented quickly. Denies any CP or SOB before or after this, or otherwise recently. Has had episodes in the past but those have been associated with heavily exerting himself and were different from this. He states that he lives with his wife who he cares for and is independent.  Review of systems complete and found to be negative unless listed above    Past Medical History:  Diagnosis Date   Arthritis    Basal cell carcinoma 08/15/2021   right cheek, EDC   CAD (coronary artery disease)    3 stents   Colon polyp    Diabetes mellitus    type II   Dyspnea    with exertion   GERD (gastroesophageal reflux disease)     Hyperlipidemia    Hypertension    Hypothyroidism    Kidney stones    stage III CKD   Neuropathy    Peripheral vascular disease    neuropathy in feet    Past Surgical History:  Procedure Laterality Date   APPENDECTOMY  1984   CATARACT EXTRACTION Bilateral 04/2001   CHOLECYSTECTOMY  1989   COLONOSCOPY N/A 2/14   hemorrhoids and tics (after pos ifob card)   CORONARY ANGIOPLASTY  2006   3 stents   CYSTOSCOPY/URETEROSCOPY/HOLMIUM LASER/STENT PLACEMENT Left 03/24/2021   Procedure: CYSTOSCOPY/URETEROSCOPY, RETROGRADE AND /STENT PLACEMENT;  Surgeon: Twylla Glendia BROCKS, MD;  Location: [facility] ORS;  Service: Urology;  Laterality: Left;   ESOPHAGOGASTRODUODENOSCOPY N/A 2/14   RETINAL DETACHMENT SURGERY Right 2003   SHOULDER ARTHROSCOPY WITH ROTATOR CUFF REPAIR AND SUBACROMIAL DECOMPRESSION Right 12/30/2017   Procedure: SHOULDER ARTHROSCOPY WITH ROTATOR CUFF REPAIR AND SUBACROMIAL DECOMPRESSION;  Surgeon: Maryl Barters, MD;  Location: [facility] ORS;  Service: Orthopedics;  Laterality: Right;   SHOULDER SURGERY  2012   Lt shoulder repair    Medications Prior to Admission  Medication Sig Dispense Refill Last Dose/Taking   acetaminophen  (TYLENOL ) 650 MG CR tablet Take 650 mg by mouth every 8 (eight) hours as needed.   Unknown   aspirin  81 MG tablet Take 81 mg by mouth daily.   11/20/2024   FEROSUL 325 (65 Fe) MG tablet TAKE ONE TABLET BY MOUTH ONCE A DAY WITH FOOD. 90  tablet 2 11/20/2024   finasteride  (PROSCAR ) 5 MG tablet Take 1 tablet (5 mg total) by mouth daily. 90 tablet 3 11/20/2024   glimepiride  (AMARYL ) 4 MG tablet Take 8 mg by mouth daily with breakfast.   11/20/2024 Morning   levothyroxine  (SYNTHROID ) 75 MCG tablet TAKE ONE TABLET (75 MCG TOTAL) BY MOUTH DAILY BEFORE BREAKFAST. 90 tablet 1 11/20/2024   NOVOLOG  MIX 70/30 FLEXPEN (70-30) 100 UNIT/ML FlexPen Inject 9 Units into the skin daily with breakfast.   11/20/2024   pantoprazole  (PROTONIX ) 40 MG tablet TAKE ONE TABLET BY MOUTH ONCE A DAY 90  tablet 1 11/20/2024   rosuvastatin  (CRESTOR ) 5 MG tablet TAKE ONE TABLET BY MOUTH EVERY OTHER DAY 45 tablet 3 11/19/2024   senna (SENOKOT) 8.6 MG tablet Take 1 tablet by mouth 2 (two) times daily.   11/20/2024   tamsulosin  (FLOMAX ) 0.4 MG CAPS capsule Take 2 capsules (0.8 mg total) by mouth daily. 180 capsule 3 11/20/2024   torsemide (DEMADEX) 10 MG tablet Take 20 mg by mouth daily.   11/20/2024   VITAMIN D  PO Take 2,000 Units by mouth daily.   11/20/2024   Continuous Blood Gluc Receiver (FREESTYLE LIBRE 3 READER) DEVI 1 each by Does not apply route as directed. Use with sensors to monitor glucose continuously 1 each 0    Continuous Blood Gluc Sensor (FREESTYLE LIBRE 3 SENSOR) MISC Place 1 sensor on the skin every 14 days. Use to check glucose continuously 6 each 3    Semaglutide,0.25 or 0.5MG /DOS, 2 MG/3ML SOPN Inject into the skin. (Patient not taking: Reported on 11/20/2024)   Not Taking   Social History   Socioeconomic History   Marital status: Married    Spouse name: Not on file   Number of children: Not on file   Years of education: Not on file   Highest education level: Not on file  Occupational History   Not on file  Tobacco Use   Smoking status: Never    Passive exposure: Never   Smokeless tobacco: Never  Vaping Use   Vaping status: Never Used  Substance and Sexual Activity   Alcohol use: No    Alcohol/week: 0.0 standard drinks of alcohol   Drug use: No   Sexual activity: Never  Other Topics Concern   Not on file  Social History Narrative   Not on file   Social Drivers of Health   Financial Resource Strain: Low Risk  (09/29/2023)   Overall Financial Resource Strain (CARDIA)    Difficulty of Paying Living Expenses: Not hard at all  Food Insecurity: No Food Insecurity (11/20/2024)   Hunger Vital Sign    Worried About Running Out of Food in the Last Year: Never true    Ran Out of Food in the Last Year: Never true  Transportation Needs: No Transportation Needs  (11/20/2024)   PRAPARE - Administrator, Civil Service (Medical): No    Lack of Transportation (Non-Medical): No  Physical Activity: Inactive (08/21/2023)   Exercise Vital Sign    Days of Exercise per Week: 0 days    Minutes of Exercise per Session: 0 min  Stress: No Stress Concern Present (08/21/2023)   [employer] of Occupational Health - Occupational Stress Questionnaire    Feeling of Stress : Only a little  Social Connections: Moderately Isolated (11/20/2024)   Social Connection and Isolation Panel    Frequency of Communication with Friends and Family: More than three times a week    Frequency of  Social Gatherings with Friends and Family: More than three times a week    Attends Religious Services: Never    Database Administrator or Organizations: No    Attends Banker Meetings: Never    Marital Status: Married  Catering Manager Violence: Not At Risk (11/20/2024)   Humiliation, Afraid, Rape, and Kick questionnaire    Fear of Current or Ex-Partner: No    Emotionally Abused: No    Physically Abused: No    Sexually Abused: No    Family History  Problem Relation Age of Onset   Cancer Brother        prostate CA   Heart disease Mother    Stroke Father    Kidney disease Sister    Kidney disease Sister    Clotting disorder Sister    COPD Brother    Heart disease Brother      Vitals:   11/20/24 2217 11/21/24 0000 11/21/24 0438 11/21/24 0829  BP:  (!) 135/58 101/72 132/61  Pulse:  83 77 84  Resp:  18 18 14   Temp:  98.2 F (36.8 C) 97.8 F (36.6 C) 97.8 F (36.6 C)  TempSrc:  Oral Oral Oral  SpO2:  99% 96% 100%  Weight: 65 kg     Height: 5' 7 (1.702 m)       PHYSICAL EXAM General: Well appearing elderly male, well nourished, in no acute distress. HEENT: Normocephalic and atraumatic. Neck: No JVD.  Lungs: Normal respiratory effort on room air. Clear bilaterally to auscultation. No wheezes, crackles, rhonchi.  Heart: HRRR. Normal S1 and S2  without gallops or murmurs.  Abdomen: Non-distended appearing.  Msk: Normal strength and tone for age. Extremities: Warm and well perfused. No clubbing, cyanosis. No edema.  Neuro: Alert and oriented X 3. Psych: Answers questions appropriately.   Labs: Basic Metabolic Panel: Recent Labs    11/20/24 1509 11/20/24 1821 11/21/24 0101  NA 142  --  144  K 4.4  --  4.3  CL 106  --  109  CO2 25  --  26  GLUCOSE 192*  --  176*  BUN 32*  --  30*  CREATININE 1.73*  --  1.95*  CALCIUM  9.7  --  9.1  MG  --  1.9  --    Liver Function Tests: Recent Labs    11/20/24 1509  AST 37  ALT 56*  ALKPHOS 112  BILITOT 0.7  PROT 6.6  ALBUMIN 4.1   No results for input(s): LIPASE, AMYLASE in the last 72 hours. CBC: Recent Labs    11/20/24 1509 11/21/24 0101  WBC 13.3* 10.1  HGB 11.8* 10.1*  HCT 36.0* 30.5*  MCV 103.4* 102.3*  PLT 198 177   Cardiac Enzymes: No results for input(s): CKTOTAL, CKMB, CKMBINDEX, TROPONINIHS in the last 72 hours. BNP: No results for input(s): BNP in the last 72 hours. D-Dimer: No results for input(s): DDIMER in the last 72 hours. Hemoglobin A1C: No results for input(s): HGBA1C in the last 72 hours. Fasting Lipid Panel: No results for input(s): CHOL, HDL, LDLCALC, TRIG, CHOLHDL, LDLDIRECT in the last 72 hours. Thyroid  Function Tests: No results for input(s): TSH, T4TOTAL, T3FREE, THYROIDAB in the last 72 hours.  Invalid input(s): FREET3 Anemia Panel: No results for input(s): VITAMINB12, FOLATE, FERRITIN, TIBC, IRON , RETICCTPCT in the last 72 hours.   Radiology: CT RENAL STONE STUDY Result Date: 11/21/2024 EXAM: CT UROGRAM 11/20/2024 10:58:14 PM TECHNIQUE: CT of the abdomen and pelvis was performed without the  administration of intravenous contrast as per CT urogram protocol. Automated exposure control, iterative reconstruction, and/or weight based adjustment of the mA/kV was utilized to reduce the  radiation dose to as low as reasonably achievable. COMPARISON: Comparison with 03/25/2021. CLINICAL HISTORY: Urolithiasis, symptomatic. Calcification on same day x-rays. FINDINGS: LOWER CHEST: Nondisplaced fracture of the anterior left 7th rib and minimal displaced fracture of the anterior left 6th. Nondisplaced fracture of the anterior right 8th rib. LIVER: The liver is unremarkable. GALLBLADDER AND BILE DUCTS: Gallbladder is unremarkable. No biliary ductal dilatation. SPLEEN: No acute abnormality. PANCREAS: The calcification in the right abdomen seen on same day x-ray corresponds to a coarse pancreatic or nodal calcification. ADRENAL GLANDS: No acute abnormality. KIDNEYS, URETERS AND BLADDER: No stones in the kidneys or ureters. No hydronephrosis. No perinephric or periureteral stranding. Urinary bladder is unremarkable. GI AND BOWEL: Extensive sigmoid diverticulosis. Possible trace adjacent stranding compatible with mild sigmoid diverticulitis. Stomach demonstrates no acute abnormality. There is no bowel obstruction. PERITONEUM AND RETROPERITONEUM: No ascites. No free air. VASCULATURE: Aorta is normal in caliber. Aortic atherosclerotic calcification. LYMPH NODES: The calcification in the right abdomen seen on same day x-ray corresponds to a coarse pancreatic or nodal calcification. REPRODUCTIVE ORGANS: No acute abnormality. BONES AND SOFT TISSUES: Nondisplaced fracture of the anterior left 7th rib and minimal displaced fracture of the anterior left 6th. Nondisplaced fracture of the anterior right 8th rib. No focal soft tissue abnormality. IMPRESSION: 1. Fractures of the right anterior 8th rib and left anterior 6th and 7th ribs. 2. Possible mild sigmoid diverticulitis. Electronically signed by: Norman Gatlin MD 11/21/2024 01:01 AM EST RP Workstation: [id number]   DG Elbow 2 Views Left Result Date: 11/20/2024 EXAM: 1 or 2 VIEW(S) XRAY OF THE LEFT ELBOW COMPARISON: None available. CLINICAL HISTORY: Left elbow  pain. FINDINGS: BONES AND JOINTS: No acute fracture. No focal osseous lesion. No joint dislocation. No joint effusion. Olecranon enthesophytes in the left elbow. SOFT TISSUES: The soft tissues are unremarkable. IMPRESSION: 1. No acute fracture or dislocation. Electronically signed by: Norman Gatlin MD 11/20/2024 09:39 PM EST RP Workstation: [id number]   DG Ribs Unilateral Right Result Date: 11/20/2024 EXAM: _VIEWS_ VIEW(S) XRAY OF THE RIGHT RIBS 11/20/2024 09:06:00 PM COMPARISON: None available. CLINICAL HISTORY: Rib pain on right side. FINDINGS: BONES: No acute displaced rib fracture. LUNGS AND PLEURA: Visualized lungs demonstrate no acute abnormality. VISUALIZED UPPER ABDOMEN: 8 mm calcification projects to the right of the L2 VERTEBRA, possibly a renal calculus. IMPRESSION: 1. No acute displaced right rib fracture. 2. 8 mm calcification projecting to the right of L2, suspicious for right renal calculus; consider clinical correlation for renal colic and dedicated imaging if indicated. Electronically signed by: Norman Gatlin MD 11/20/2024 09:38 PM EST RP Workstation: [id number]   DG Toe Great Right Result Date: 11/20/2024 CLINICAL DATA:  Great toe pain, fall. EXAM: RIGHT GREAT TOE COMPARISON:  None Available. FINDINGS: There is an acute transverse fracture through the proximal aspect of the first distal phalanx. This is nondisplaced. There is no dislocation. Joint spaces are maintained. Soft tissue swelling is present surrounding the fracture. IMPRESSION: Acute nondisplaced fracture through the proximal aspect of the first distal phalanx. Electronically Signed   By: Greig Pique M.D.   On: 11/20/2024 21:35   DG Ribs Unilateral W/Chest Left Result Date: 11/20/2024 CLINICAL DATA:  Status post fall with left-sided rib pain EXAM: LEFT RIBS AND CHEST - 3+ VIEW COMPARISON:  Chest radiograph dated 10/15/2021 FINDINGS: No radiographic finding of acute displaced fracture. There is no  evidence of pneumothorax  or pleural effusion. Both lungs are clear. Heart size and mediastinal contours are within normal limits. IMPRESSION: No radiographic finding of acute displaced fracture. Electronically Signed   By: Limin  Xu M.D.   On: 11/20/2024 16:06   CT Head Wo Contrast Result Date: 11/20/2024 CLINICAL DATA:  Clemens today with trauma to the head and neck. EXAM: CT HEAD WITHOUT CONTRAST CT CERVICAL SPINE WITHOUT CONTRAST TECHNIQUE: Multidetector CT imaging of the head and cervical spine was performed following the standard protocol without intravenous contrast. Multiplanar CT image reconstructions of the cervical spine were also generated. RADIATION DOSE REDUCTION: This exam was performed according to the departmental dose-optimization program which includes automated exposure control, adjustment of the mA and/or kV according to patient size and/or use of iterative reconstruction technique. COMPARISON:  None Available. FINDINGS: CT HEAD FINDINGS Brain: Age related volume loss. No sign of acute infarction, mass lesion, hemorrhage, hydrocephalus or extra-axial collection. Small-vessel change of the white matter with old lacunar infarctions in the external capsule regions. Vascular: There is atherosclerotic calcification of the major vessels at the base of the brain. Skull: No significant finding. Sinuses/Orbits: Clear/normal Other: None CT CERVICAL SPINE FINDINGS Alignment: No traumatic malalignment. Skull base and vertebrae: No fracture or focal bone lesion. Chronic fusion at the C2-3 level. Chronic fusion from C4 through C6. Soft tissues and spinal canal: Negative Disc levels: Ordinary degenerative arthritis at the C1-2 articulation. Chronic degenerative spondylosis C3-4 and C6-7. No compressive central canal stenosis. Upper chest: Negative Other: None IMPRESSION: HEAD CT: No acute or traumatic finding. Age related volume loss. Chronic small-vessel ischemic changes of the white matter. CERVICAL SPINE CT: No acute or traumatic  finding. Chronic degenerative changes as outlined above. Electronically Signed   By: Oneil Officer M.D.   On: 11/20/2024 15:38   CT Cervical Spine Wo Contrast Result Date: 11/20/2024 CLINICAL DATA:  Clemens today with trauma to the head and neck. EXAM: CT HEAD WITHOUT CONTRAST CT CERVICAL SPINE WITHOUT CONTRAST TECHNIQUE: Multidetector CT imaging of the head and cervical spine was performed following the standard protocol without intravenous contrast. Multiplanar CT image reconstructions of the cervical spine were also generated. RADIATION DOSE REDUCTION: This exam was performed according to the departmental dose-optimization program which includes automated exposure control, adjustment of the mA and/or kV according to patient size and/or use of iterative reconstruction technique. COMPARISON:  None Available. FINDINGS: CT HEAD FINDINGS Brain: Age related volume loss. No sign of acute infarction, mass lesion, hemorrhage, hydrocephalus or extra-axial collection. Small-vessel change of the white matter with old lacunar infarctions in the external capsule regions. Vascular: There is atherosclerotic calcification of the major vessels at the base of the brain. Skull: No significant finding. Sinuses/Orbits: Clear/normal Other: None CT CERVICAL SPINE FINDINGS Alignment: No traumatic malalignment. Skull base and vertebrae: No fracture or focal bone lesion. Chronic fusion at the C2-3 level. Chronic fusion from C4 through C6. Soft tissues and spinal canal: Negative Disc levels: Ordinary degenerative arthritis at the C1-2 articulation. Chronic degenerative spondylosis C3-4 and C6-7. No compressive central canal stenosis. Upper chest: Negative Other: None IMPRESSION: HEAD CT: No acute or traumatic finding. Age related volume loss. Chronic small-vessel ischemic changes of the white matter. CERVICAL SPINE CT: No acute or traumatic finding. Chronic degenerative changes as outlined above. Electronically Signed   By: Oneil Officer  M.D.   On: 11/20/2024 15:38    ECHO ordered  Prior 01/2020: NORMAL LEFT VENTRICULAR SYSTOLIC FUNCTION WITH AN ESTIMATED EF = 55 %  NORMAL RIGHT VENTRICULAR SYSTOLIC FUNCTION  MODERATE TRICUSPID AND MITRAL VALVE INSUFFICIENCY  NO VALVULAR STENOSIS  MILD RV ENLARGEMENT  MILD BIATRIAL ENLARGEMENT  MILD LVH   TELEMETRY (personally reviewed): sinus rhythm rate 80s  EKG (personally reviewed): NSR RBBB rate 80 bpm  Data reviewed by me 11/21/2024: last 24h vitals tele labs imaging I/O ED provider note, admission H&P  Principal Problem:   Syncope Active Problems:   Hyperlipidemia associated with type 2 diabetes mellitus (HCC)   Hypothyroid   BPH (benign prostatic hyperplasia)   Type II diabetes mellitus with renal manifestations (HCC)   Macrocytic anemia   Chronic diastolic CHF (congestive heart failure) (HCC)   Chronic kidney disease, stage 3b (HCC)   Myocardial injury   CAD (coronary artery disease)   Prolonged QT interval   Depression   Fall at home, initial encounter   Closed fracture of great toe of right foot   Right rib fracture   posible sigmoid diverticulitis    ASSESSMENT AND PLAN:  David Henry is a 89 y.o. male  with a past medical history of coronary artery disease s/p PCI and stent of LAD and LCx 2006,  moderate mitral insufficiency, bradycardia, hypertension, hyperlipidemia, type 2 diabetes who presented to the ED on 11/20/2024 for syncope. Cardiology was consulted for further evaluation.   # Syncope # Coronary artery disease # Demand ischemia Episode of syncope with prodrome of dizziness yesterday. Minimal and flat trops, no EKG changes.  -Suspect vasovagal syncope. Encouraged PO hydration.  -No events noted on telemetry. Doubt outpatient holter would be beneficial.  -Echo ordered, further recommendations pending these results.  -Minimal and flat troponin most consistent with demand/supply mismatch and not ACS.   This patient's plan of care was discussed  and created with Dr. Florencio and he is in agreement.  Signed: Danita Bloch, PA-C  11/21/2024, 11:44 AM [facility]

## 2024-11-21 NOTE — Assessment & Plan Note (Signed)
 Mildly positive troponin with a flat curve with history of CAD, no chest pain.  Likely demand ischemia with current syncopal episode. -Continue with aspirin  and Crestor 

## 2024-11-21 NOTE — Assessment & Plan Note (Signed)
 Hemoglobin seems stable. - Continue home supplement -Continue to monitor

## 2024-11-21 NOTE — Assessment & Plan Note (Signed)
 Mildly elevated QTc at 516. - Continue to monitor -Avoid QTc prolonging medications

## 2024-11-21 NOTE — Assessment & Plan Note (Signed)
 Patient is taking Amaryl  2 mg daily and 70/30 insulin  9 units every morning -SSI - 70/30 insulin  9 unit every morning

## 2024-11-21 NOTE — Assessment & Plan Note (Signed)
 Likely vasovagal after taking the shower. Barely positive orthostatic vitals.  CT head negative.  Echocardiogram ordered and pending. Cardiology was consulted at daughter's request. - Follow-up echocardiogram results

## 2024-11-21 NOTE — Assessment & Plan Note (Signed)
 CT stone study with fractures of right anterior eighth rib and left anterior 6th and 7th ribs. - Incentive spirometry -Lidoderm  patch -Supportive care and pain management

## 2024-11-21 NOTE — Progress Notes (Signed)
 Progress Note   Patient: David Henry FMW:[id number] DOB: [birth year] DOA: 11/20/2024     0 DOS: the patient was seen and examined on 11/21/2024   Brief hospital course: Partly taken from H&P.  DELIA SITAR is a 89 y.o. male with medical history significant of HTN, HLD, DM, PVD, CAD with stent, dCHF, hypothyroidism, GERD, BPH, CKD-3b, anemia, kidney stone, who presents with syncope.  Patient initially felt dizzy and passed out for few seconds after getting out of shower this afternoon, resulted in a fall.  Patient had a small skin laceration over right forehead, left elbow, complaining of right toe pain and x-ray of the right toe with an acute nondisplaced fracture through the proximal aspect of first distal phalanx.  On presentation hemodynamically stable, labs with leukocytosis at 13.3, worsening renal function, negative UA,negative PCR for COVID, flu and RSV, trop 61 --> 59, proBNP 670.  CT head and cervical spine was negative for any acute injury, did show degenerative disc disease. Chest x-ray was negative for any rib fractures.  Left elbow x-ray was negative. CT renal stone study shows fracture of right anterior eighth and left anterior 6th and 7th ribs, did show possible mild sigmoid diverticulitis, no nephrolithiasis or hydronephrosis. EKG with NSR, QTc 516, bifascicular block and poor R wave progression.  Admitted for syncope workup, started on Zosyn for concern of diverticulitis.  PT and OT ordered.  12/1: Vital stable, preliminary blood cultures negative, troponin with a flat curve to 62, leukocytosis resolved, worsening creatinine at 1.95.  Assessment and Plan: * Syncope Likely vasovagal after taking the shower. Barely positive orthostatic vitals.  CT head negative.  Echocardiogram ordered and pending. Cardiology was consulted at daughter's request. - Follow-up echocardiogram results  Fall at home, initial encounter PT and OT are recommending home health.  CAD  (coronary artery disease) Mildly positive troponin with a flat curve with history of CAD, no chest pain.  Likely demand ischemia with current syncopal episode. -Continue with aspirin  and Crestor   Chronic diastolic CHF (congestive heart failure) (HCC)  2D echo on 02/02/2020 showed EF> 55% with grade 1 diastolic dysfunction.  Clinically appears well compensated.  Holding home torsemide -Monitor volume status closely as giving some IV fluid  Prolonged QT interval Mildly elevated QTc at 516. - Continue to monitor -Avoid QTc prolonging medications  posible sigmoid diverticulitis CT abdomen concerning for mild diverticulitis with leukocytosis on admission which has been improved. Preliminary blood cultures negative -Continue with Zosyn for now -Follow-up cultures  Chronic kidney disease, stage 3b (HCC) Mild AKI with underlying CKD stage IIIb, baseline creatinine seems to be around 1.4-1.5, today at 1.95. - Holding home torsemide -Gentle IV fluid -Monitor renal function -Avoid nephrotoxins  Hyperlipidemia associated with type 2 diabetes mellitus (HCC) - Continue Crestor   Type II diabetes mellitus with renal manifestations (HCC) Patient is taking Amaryl  2 mg daily and 70/30 insulin  9 units every morning -SSI - 70/30 insulin  9 unit every morning  BPH (benign prostatic hyperplasia) - Continue Proscar  -Restarting Flomax   Hypothyroid - Continue home Synthroid   Right rib fracture CT stone study with fractures of right anterior eighth rib and left anterior 6th and 7th ribs. - Incentive spirometry -Lidoderm  patch -Supportive care and pain management  Closed fracture of great toe of right foot Likely secondary to fall, had nondisplaced fracture of right great toe. - Supportive care  Macrocytic anemia Hemoglobin seems stable. - Continue home supplement -Continue to monitor  Depression Patient was not taking Paxil  currently. - We  will keep holding due to QTc  prolongation   Subjective: Patient was seen and examined today.  Denies any significant pain.  No dizziness, chest pain or shortness of breath.  Daughter at bedside and requesting evaluation by cardiology.  Physical Exam: Vitals:   11/21/24 0000 11/21/24 0438 11/21/24 0829 11/21/24 1246  BP: (!) 135/58 101/72 132/61 (!) 148/66  Pulse: 83 77 84 72  Resp: 18 18 14 14   Temp: 98.2 F (36.8 C) 97.8 F (36.6 C) 97.8 F (36.6 C)   TempSrc: Oral Oral Oral   SpO2: 99% 96% 100% 100%  Weight:      Height:       General.  Frail elderly man, in no acute distress. Pulmonary.  Lungs clear bilaterally, normal respiratory effort. CV.  Regular rate and rhythm, no JVD, rub or murmur. Abdomen.  Soft, nontender, nondistended, BS positive. CNS.  Alert and oriented .  No focal neurologic deficit. Extremities.  No edema, no cyanosis, pulses intact and symmetrical.  Data Reviewed: Prior data reviewed  Family Communication: Discussed with with daughter at bedside  Disposition: Status is: Inpatient Remains inpatient appropriate because: Severity of illness, need more IV fluid for AKI  Planned Discharge Destination: Home with Home Health  DVT prophylaxis.  Lovenox Time spent: 50 minutes  This record has been created using Conservation officer, historic buildings. Errors have been sought and corrected,but may not always be located. Such creation errors do not reflect on the standard of care.   Author: Amaryllis Dare, MD 11/21/2024 3:00 PM  For on call review [email or web address].

## 2024-11-21 NOTE — Assessment & Plan Note (Signed)
 Continue home Synthroid

## 2024-11-21 NOTE — Evaluation (Signed)
 Physical Therapy Evaluation Patient Details Name: DENY CHEVEZ MRN: [id number] DOB: 07-09-1926 Today's Date: 11/21/2024  History of Present Illness  Pt is a 89 y.o. male who presents s/p fall in the shower with possible syncope resulting in fractures of the right anterior 8th rib and left anterior 6th and 7th ribs and acute nondisplaced fracture through the proximal aspect of the first  distal phalanx. Current MD assessment: syncope, fall. PMH of HTN, HLD, DM, PVD, CAD with stent, dCHF, hypothyroidism, GERD, BPH, CKD-3b, anemia, kidney stone, who presents with syncope.  Clinical Impression  Patient is agreeable to PT evaluation. Supportive daughter at the bedside. The patient is active and independent without assistive device at baseline. 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  Today the patient is mobilizing without assistance. He walked with and without rolling walker. Gait pattern, posture, and balance is improved with the walker which is recommended for home use. He navigated 4 steps with CGA using rails. The patient is eager to go home as soon as he can. Recommend continued family support and home health PT. Will continue to follow.       If plan is discharge home, recommend the following: Assist for transportation   Can travel by private vehicle        Equipment Recommendations Rolling walker (2 wheels)  Recommendations for Other Services       Functional Status Assessment Patient has had a recent decline in their functional status and demonstrates the ability to make significant improvements in function in a reasonable and predictable amount of time.     Precautions / Restrictions Precautions Precautions: Fall Recall of Precautions/Restrictions: Intact Restrictions Weight Bearing Restrictions Per Provider Order: No      Mobility  Bed Mobility Overal bed mobility: Needs Assistance Bed Mobility: Supine to Sit, Sit to Supine     Supine to sit: Contact guard Sit  to supine: Supervision   General bed mobility comments: increased time required for sitting up on left side of bed.    Transfers Overall transfer level: Needs assistance Equipment used: Rolling walker (2 wheels) Transfers: Sit to/from Stand Sit to Stand: Supervision           General transfer comment: supervision for safety    Ambulation/Gait Ambulation/Gait assistance: Contact guard assist, Supervision Gait Distance (Feet): 180 Feet Assistive device: Rolling walker (2 wheels), None Gait Pattern/deviations: Step-through pattern, Decreased stride length, Trunk flexed, Narrow base of support Gait velocity: decreased     General Gait Details: patient ambulated with and without rolling walker. gait pattern and balance are improved with the walker which is recommended initially for safety. discussed this with patient and daughter who are in agreement  Stairs Stairs: Yes Stairs assistance: Contact guard assist Stair Management: Two rails, Alternating pattern, Forwards Number of Stairs: 4 General stair comments: patient went up/down 4 steps with rails with occasional safety cues  Wheelchair Mobility     Tilt Bed    Modified Rankin (Stroke Patients Only)       Balance Overall balance assessment: Needs assistance Sitting-balance support: Feet supported Sitting balance-Leahy Scale: Good     Standing balance support: No upper extremity supported Standing balance-Leahy Scale: Fair                               Pertinent Vitals/Pain Pain Assessment Pain Assessment: No/denies pain    Home Living Family/patient expects to be discharged to:: Private residence Living Arrangements:  Spouse/significant other Available Help at Discharge: Family Type of Home: House Home Access: Stairs to enter Entrance Stairs-Rails: Doctor, General Practice of Steps: 5   Home Layout: One level (bonus room upstairs but not required (14 steps with rails)) Home  Equipment: Standard Walker;Grab bars - tub/shower;BSC/3in1      Prior Function Prior Level of Function : Independent/Modified Independent             Mobility Comments: independent with walking, active and does yard work. has not driven in months ADLs Comments: independent     Extremity/Trunk Assessment   Upper Extremity Assessment Upper Extremity Assessment: Defer to OT evaluation    Lower Extremity Assessment Lower Extremity Assessment: Overall WFL for tasks assessed       Communication   Communication Communication: No apparent difficulties    Cognition Arousal: Alert Behavior During Therapy: WFL for tasks assessed/performed   PT - Cognitive impairments: No apparent impairments                         Following commands: Intact       Cueing Cueing Techniques: Verbal cues     General Comments General comments (skin integrity, edema, etc.): orthostatic vitals taken with activity with no dizziness reported    Exercises     Assessment/Plan    PT Assessment Patient needs continued PT services  PT Problem List Decreased strength;Decreased range of motion;Decreased activity tolerance;Decreased balance;Decreased mobility;Decreased safety awareness       PT Treatment Interventions DME instruction;Stair training;Gait training;Functional mobility training;Therapeutic activities;Balance training;Therapeutic exercise;Neuromuscular re-education;Cognitive remediation;Patient/family education    PT Goals (Current goals can be found in the Care Plan section)  Acute Rehab PT Goals Patient Stated Goal: to go home PT Goal Formulation: With patient Time For Goal Achievement: 12/05/24 Potential to Achieve Goals: Fair    Frequency Min 2X/week     Co-evaluation               AM-PAC PT 6 Clicks Mobility  Outcome Measure Help needed turning from your back to your side while in a flat bed without using bedrails?: None Help needed moving from lying on  your back to sitting on the side of a flat bed without using bedrails?: A Little Help needed moving to and from a bed to a chair (including a wheelchair)?: A Little Help needed standing up from a chair using your arms (e.g., wheelchair or bedside chair)?: A Little Help needed to walk in hospital room?: A Little Help needed climbing 3-5 steps with a railing? : A Little 6 Click Score: 19    End of Session   Activity Tolerance: Patient tolerated treatment well Patient left: in bed;with call bell/phone within reach;with family/visitor present Nurse Communication: Mobility status PT Visit Diagnosis: Unsteadiness on feet (R26.81);Muscle weakness (generalized) (M62.81)    Time: 9089-9057 PT Time Calculation (min) (ACUTE ONLY): 32 min   Charges:   PT Evaluation $PT Eval Low Complexity: 1 Low PT Treatments $Gait Training: 8-22 mins PT General Charges $$ ACUTE PT VISIT: 1 Visit         Randine Essex, PT, MPT   Randine LULLA Essex 11/21/2024, 11:02 AM

## 2024-11-21 NOTE — Assessment & Plan Note (Signed)
-   Continue Proscar  -Restarting Flomax

## 2024-11-21 NOTE — Discharge Instructions (Signed)

## 2024-11-21 NOTE — Assessment & Plan Note (Signed)
PT and OT are recommending home health

## 2024-11-21 NOTE — Assessment & Plan Note (Signed)
 Patient was not taking Paxil  currently. - We will keep holding due to QTc prolongation

## 2024-11-21 NOTE — Assessment & Plan Note (Signed)
 Mild AKI with underlying CKD stage IIIb, baseline creatinine seems to be around 1.4-1.5, today at 1.95. - Holding home torsemide -Gentle IV fluid -Monitor renal function -Avoid nephrotoxins

## 2024-11-21 NOTE — Assessment & Plan Note (Signed)
 2D echo on 02/02/2020 showed EF> 55% with grade 1 diastolic dysfunction.  Clinically appears well compensated.  Holding home torsemide -Monitor volume status closely as giving some IV fluid

## 2024-11-21 NOTE — Plan of Care (Signed)
  Problem: Health Behavior/Discharge Planning: Goal: Ability to identify and utilize available resources and services will improve Outcome: Progressing   Problem: Skin Integrity: Goal: Risk for impaired skin integrity will decrease Outcome: Progressing   Problem: Clinical Measurements: Goal: Will remain free from infection Outcome: Progressing Goal: Cardiovascular complication will be avoided Outcome: Progressing

## 2024-11-21 NOTE — Hospital Course (Signed)
 Partly taken from H&P.  David Henry is a 89 y.o. male with medical history significant of HTN, HLD, DM, PVD, CAD with stent, dCHF, hypothyroidism, GERD, BPH, CKD-3b, anemia, kidney stone, who presents with syncope.  Patient initially felt dizzy and passed out for few seconds after getting out of shower this afternoon, resulted in a fall.  Patient had a small skin laceration over right forehead, left elbow, complaining of right toe pain and x-ray of the right toe with an acute nondisplaced fracture through the proximal aspect of first distal phalanx.  On presentation hemodynamically stable, labs with leukocytosis at 13.3, worsening renal function, negative UA,negative PCR for COVID, flu and RSV, trop 61 --> 59, proBNP 670.  CT head and cervical spine was negative for any acute injury, did show degenerative disc disease. Chest x-ray was negative for any rib fractures.  Left elbow x-ray was negative. CT renal stone study shows fracture of right anterior eighth and left anterior 6th and 7th ribs, did show possible mild sigmoid diverticulitis, no nephrolithiasis or hydronephrosis. EKG with NSR, QTc 516, bifascicular block and poor R wave progression.  Admitted for syncope workup, started on Zosyn for concern of diverticulitis.  PT and OT ordered.  12/1: Vital stable, preliminary blood cultures negative, troponin with a flat curve to 62, leukocytosis resolved, worsening creatinine at 1.95.  12/2: Hemodynamically stable, improving creatinine to 1.63.  Echocardiogram with EF of 45 to 50%, grade 1 diastolic dysfunction and asymmetric hypertrophy of septum.  Cardiology did not suggested to make any changes to his medications.  Clinically appears stable and they will follow-up closely as outpatient.  Patient received some Zosyn for concern of sigmoid diverticulitis and is being discharged on 3 more days of Augmentin to complete the course.  Patient was also given some lidocaine  patches to use if  needed for rib cage pain due to rib fractures.  He will continue with his home medications and need to have a close follow-up with his providers for further assistance.

## 2024-11-21 NOTE — Evaluation (Signed)
 Occupational Therapy Evaluation Patient Details Name: David Henry MRN: [id number] DOB: August 18, 1926 Today's Date: 11/21/2024   History of Present Illness   Pt is a 89 y.o. male who presents s/p fall in the shower with possible syncope resulting in fractures of the right anterior 8th rib and left anterior 6th and 7th ribs and acute nondisplaced fracture through the proximal aspect of the first  distal phalanx. Current MD assessment: syncope, fall. PMH of HTN, HLD, DM, PVD, CAD with stent, dCHF, hypothyroidism, GERD, BPH, CKD-3b, anemia, kidney stone, who presents with syncope.     Clinical Impressions Pt was seen for OT evaluation this date. PTA, pt resides at home with his wife and is typically IND at baseline without AD use and assists his wife with her care. Family is nearby and checks in often. Pt presents with mild deficits in balance, affecting safe and optimal ADL completion. Pt currently requires CGA for bed mobility and supervision for transfers and mobility using RW.  Appears supervision to MOD I level with ADL performance. Ambulating with no UE support with mild unsteadiness, recommended use of RW at all times. Family in room and aware. Recommended BSC use as shower chair at home and handheld shower hair to be installed as well as daughter or other family member to be present in the home during showers at this time to maximize safety. Recommend follow up OT services at home if needed to ensure home environment safety and address fall/trip hazards. Will follow acutely to promote return to PLOF.       If plan is discharge home, recommend the following:   A little help with walking and/or transfers;A little help with bathing/dressing/bathroom;Help with stairs or ramp for entrance;Assistance with cooking/housework     Functional Status Assessment   Patient has had a recent decline in their functional status and demonstrates the ability to make significant improvements in function in  a reasonable and predictable amount of time.     Equipment Recommendations   Other (comment) (handheld shower head-family will put in)     Recommendations for Other Services         Precautions/Restrictions   Precautions Precautions: Fall Recall of Precautions/Restrictions: Intact Restrictions Weight Bearing Restrictions Per Provider Order: No     Mobility Bed Mobility Overal bed mobility: Needs Assistance Bed Mobility: Supine to Sit, Sit to Supine     Supine to sit: Contact guard Sit to supine: Supervision   General bed mobility comments: increased time/effort to sit at EOB    Transfers Overall transfer level: Needs assistance Equipment used: Rolling walker (2 wheels) Transfers: Sit to/from Stand Sit to Stand: Supervision           General transfer comment: supervision provided for transfers and ambulation, stood for all BP measurements, not orthostatic and not symptomatic; supervision to ambulate increased distance and perform stair training without difficulty      Balance Overall balance assessment: Needs assistance Sitting-balance support: Feet supported Sitting balance-Leahy Scale: Good     Standing balance support: No upper extremity supported Standing balance-Leahy Scale: Fair Standing balance comment: balance is improved with RW use                           ADL either performed or assessed with clinical judgement   ADL Overall ADL's : Needs assistance/impaired                 Upper Body Dressing : Sitting;Minimal assistance  Upper Body Dressing Details (indicate cue type and reason): don gown                 Functional mobility during ADLs: Supervision/safety;Rolling walker (2 wheels) General ADL Comments: anticipate supervision to MOD I with ADL performance, recommended hand held shower head, shower seat and someone to be in the home during showering tasks     Vision         Perception         Praxis          Pertinent Vitals/Pain Pain Assessment Pain Assessment: No/denies pain     Extremity/Trunk Assessment Upper Extremity Assessment Upper Extremity Assessment: Overall WFL for tasks assessed   Lower Extremity Assessment Lower Extremity Assessment: Overall WFL for tasks assessed       Communication Communication Communication: No apparent difficulties   Cognition Arousal: Alert Behavior During Therapy: WFL for tasks assessed/performed                                 Following commands: Intact       Cueing  General Comments   Cueing Techniques: Verbal cues  orthostatic vitals taken with activity with no dizziness reported   Exercises Other Exercises Other Exercises: Edu on role of OT in acute setting, use of DME/AE/AD to maximize safety and IND with ADL performance to prevent falls.   Shoulder Instructions      Home Living Family/patient expects to be discharged to:: Private residence Living Arrangements: Spouse/significant other Available Help at Discharge: Family Type of Home: House Home Access: Stairs to enter Secretary/administrator of Steps: 5 Entrance Stairs-Rails: Right;Left Home Layout: One level (bonus room upstairs but not required (14 steps with rails))     Bathroom Shower/Tub: Producer, Television/film/video: Standard     Home Equipment: Standard Walker;Grab bars - tub/shower;BSC/3in1          Prior Functioning/Environment Prior Level of Function : Independent/Modified Independent             Mobility Comments: independent with walking, active and does yard work. has not driven in months ADLs Comments: independent    OT Problem List: Impaired balance (sitting and/or standing)   OT Treatment/Interventions: Self-care/ADL training;Therapeutic exercise;Therapeutic activities;DME and/or AE instruction;Patient/family education;Balance training      OT Goals(Current goals can be found in the care plan section)   Acute  Rehab OT Goals Patient Stated Goal: go home OT Goal Formulation: With patient/family Time For Goal Achievement: 12/05/24 Potential to Achieve Goals: Good ADL Goals Pt Will Transfer to Toilet: with modified independence;ambulating Additional ADL Goal #1: Pt will demo implementation of falls prevention strategies 2/2 trials during ambulation and ADL performance to maximize safety at home.   OT Frequency:  Min 1X/week    Co-evaluation              AM-PAC OT 6 Clicks Daily Activity     Outcome Measure Help from another person eating meals?: None Help from another person taking care of personal grooming?: None Help from another person toileting, which includes using toliet, bedpan, or urinal?: A Little Help from another person bathing (including washing, rinsing, drying)?: A Little Help from another person to put on and taking off regular upper body clothing?: None Help from another person to put on and taking off regular lower body clothing?: A Little 6 Click Score: 21   End of Session Equipment Utilized During  Treatment: Gait belt;Rolling walker (2 wheels) Nurse Communication: Mobility status  Activity Tolerance: Patient tolerated treatment well Patient left: in bed;with call bell/phone within reach;with family/visitor present  OT Visit Diagnosis: Other abnormalities of gait and mobility (R26.89)                Time: 9089-9060 OT Time Calculation (min): 29 min Charges:  OT General Charges $OT Visit: 1 Visit OT Evaluation $OT Eval Low Complexity: 1 Low  Derrisha Foos Chrismon, OTR/L 11/21/24, 12:16 PM   Mcdaniel Ohms E Chrismon 11/21/2024, 12:12 PM

## 2024-11-21 NOTE — Assessment & Plan Note (Signed)
 Likely secondary to fall, had nondisplaced fracture of right great toe. - Supportive care

## 2024-11-21 NOTE — Care Management Obs Status (Signed)
 MEDICARE OBSERVATION STATUS NOTIFICATION   Patient Details  Name: David Henry MRN: [id number] Date of Birth: 1926/11/05   Medicare Observation Status Notification Given:  Yes    Linwood Gullikson W, CMA 11/21/2024, 10:21 AM

## 2024-11-22 ENCOUNTER — Other Ambulatory Visit: Payer: Self-pay

## 2024-11-22 DIAGNOSIS — R0789 Other chest pain: Secondary | ICD-10-CM | POA: Diagnosis not present

## 2024-11-22 DIAGNOSIS — R55 Syncope and collapse: Secondary | ICD-10-CM | POA: Diagnosis not present

## 2024-11-22 DIAGNOSIS — S0181XA Laceration without foreign body of other part of head, initial encounter: Secondary | ICD-10-CM | POA: Diagnosis not present

## 2024-11-22 DIAGNOSIS — W19XXXA Unspecified fall, initial encounter: Secondary | ICD-10-CM | POA: Diagnosis not present

## 2024-11-22 LAB — BASIC METABOLIC PANEL WITH GFR
Anion gap: 10 (ref 5–15)
BUN: 32 mg/dL — ABNORMAL HIGH (ref 8–23)
CO2: 23 mmol/L (ref 22–32)
Calcium: 9 mg/dL (ref 8.9–10.3)
Chloride: 110 mmol/L (ref 98–111)
Creatinine, Ser: 1.63 mg/dL — ABNORMAL HIGH (ref 0.61–1.24)
GFR, Estimated: 38 mL/min — ABNORMAL LOW (ref >60)
Glucose, Bld: 151 mg/dL — ABNORMAL HIGH (ref 70–99)
Potassium: 4.3 mmol/L (ref 3.5–5.1)
Sodium: 142 mmol/L (ref 135–145)

## 2024-11-22 LAB — CBC
HCT: 30.4 % — ABNORMAL LOW (ref 39.0–52.0)
Hemoglobin: 10 g/dL — ABNORMAL LOW (ref 13.0–17.0)
MCH: 34.2 pg — ABNORMAL HIGH (ref 26.0–34.0)
MCHC: 32.9 g/dL (ref 30.0–36.0)
MCV: 104.1 fL — ABNORMAL HIGH (ref 80.0–100.0)
Platelets: 169 K/uL (ref 150–400)
RBC: 2.92 MIL/uL — ABNORMAL LOW (ref 4.22–5.81)
RDW: 14.8 % (ref 11.5–15.5)
WBC: 8.1 K/uL (ref 4.0–10.5)
nRBC: 0 % (ref 0.0–0.2)

## 2024-11-22 LAB — GLUCOSE, CAPILLARY: Glucose-Capillary: 150 mg/dL — ABNORMAL HIGH (ref 70–99)

## 2024-11-22 MED ORDER — LIDOCAINE 5 % EX PTCH
1.0000 | MEDICATED_PATCH | CUTANEOUS | 0 refills | Status: AC
  Filled 2024-11-22: qty 30, 30d supply, fill #0

## 2024-11-22 MED ORDER — AMOXICILLIN-POT CLAVULANATE 875-125 MG PO TABS
1.0000 | ORAL_TABLET | Freq: Two times a day (BID) | ORAL | 0 refills | Status: AC
  Filled 2024-11-22: qty 6, 3d supply, fill #0

## 2024-11-22 NOTE — Discharge Summary (Signed)
 Physician Discharge Summary   Patient: David Henry MRN: [id number] DOB: March 27, 1926  Admit date:     11/20/2024  Discharge date: 11/22/24  Discharge Physician: Amaryllis Dare   PCP: Randeen Laine LABOR, MD   Recommendations at discharge:  Please obtain CBC and BMP on follow-up Follow-up with primary care provider Follow-up with cardiology  Discharge Diagnoses: Principal Problem:   Syncope Active Problems:   Fall   Myocardial injury   CAD (coronary artery disease)   Chronic diastolic CHF (congestive heart failure) (HCC)   Prolonged Q-T interval on ECG   posible sigmoid diverticulitis   Chronic kidney disease, stage 3b (HCC)   Hyperlipidemia associated with type 2 diabetes mellitus (HCC)   Type II diabetes mellitus with renal manifestations (HCC)   BPH (benign prostatic hyperplasia)   Hypothyroid   Closed fracture of great toe of right foot   Right rib fracture   Macrocytic anemia   Depression   Chest wall pain   Laceration of forehead   Hospital Course: Partly taken from H&P.  David Henry is a 89 y.o. male with medical history significant of HTN, HLD, DM, PVD, CAD with stent, dCHF, hypothyroidism, GERD, BPH, CKD-3b, anemia, kidney stone, who presents with syncope.  Patient initially felt dizzy and passed out for few seconds after getting out of shower this afternoon, resulted in a fall.  Patient had a small skin laceration over right forehead, left elbow, complaining of right toe pain and x-ray of the right toe with an acute nondisplaced fracture through the proximal aspect of first distal phalanx.  On presentation hemodynamically stable, labs with leukocytosis at 13.3, worsening renal function, negative UA,negative PCR for COVID, flu and RSV, trop 61 --> 59, proBNP 670.  CT head and cervical spine was negative for any acute injury, did show degenerative disc disease. Chest x-ray was negative for any rib fractures.  Left elbow x-ray was negative. CT renal stone study  shows fracture of right anterior eighth and left anterior 6th and 7th ribs, did show possible mild sigmoid diverticulitis, no nephrolithiasis or hydronephrosis. EKG with NSR, QTc 516, bifascicular block and poor R wave progression.  Admitted for syncope workup, started on Zosyn for concern of diverticulitis.  PT and OT ordered.  12/1: Vital stable, preliminary blood cultures negative, troponin with a flat curve to 62, leukocytosis resolved, worsening creatinine at 1.95.  12/2: Hemodynamically stable, improving creatinine to 1.63.  Echocardiogram with EF of 45 to 50%, grade 1 diastolic dysfunction and asymmetric hypertrophy of septum.  Cardiology did not suggested to make any changes to his medications.  Clinically appears stable and they will follow-up closely as outpatient.  Patient received some Zosyn for concern of sigmoid diverticulitis and is being discharged on 3 more days of Augmentin to complete the course.  Patient was also given some lidocaine  patches to use if needed for rib cage pain due to rib fractures.  He will continue with his home medications and need to have a close follow-up with his providers for further assistance.  Assessment and Plan: * Syncope Likely vasovagal after taking the shower. Barely positive orthostatic vitals.  CT head negative.  Echocardiogram with EF of 45 to 50%, grade 1 diastolic dysfunction and asymmetrical septal hypertrophy, cardiology will follow-up as outpatient.  Fall PT and OT are recommending home health.  CAD (coronary artery disease) Mildly positive troponin with a flat curve with history of CAD, no chest pain.  Likely demand ischemia with current syncopal episode. -Continue with aspirin  and  Crestor   Chronic diastolic CHF (congestive heart failure) (HCC)  2D echo on 02/02/2020 showed EF> 55% with grade 1 diastolic dysfunction.  Repeat echo with slightly more depressed EF of 45 to 50%. Clinically appears well compensated.  Holding home  torsemide for few days and then he will resume as he appears dry  Prolonged Q-T interval on ECG Mildly elevated QTc at 516. - Continue to monitor -Avoid QTc prolonging medications  posible sigmoid diverticulitis CT abdomen concerning for mild diverticulitis with leukocytosis on admission which has been improved. Preliminary blood cultures negative - Received Zosyn  while in the hospital and is being discharged on 3 more days of Augmentin .  Chronic kidney disease, stage 3b (HCC) Mild AKI with underlying CKD stage IIIb, baseline creatinine seems to be around 1.4-1.5, today at 1.63 - Holding home torsemide for few more days - Follow-up with his nephrologist  Hyperlipidemia associated with type 2 diabetes mellitus (HCC) - Continue Crestor   Type II diabetes mellitus with renal manifestations (HCC) Patient is taking Amaryl  2 mg daily and 70/30 insulin  9 units every morning - Continue home medications  BPH (benign prostatic hyperplasia) - Continue Proscar  -Restarting Flomax   Hypothyroid - Continue home Synthroid   Right rib fracture CT stone study with fractures of right anterior eighth rib and left anterior 6th and 7th ribs. - Incentive spirometry -Lidoderm  patch -Supportive care and pain management  Closed fracture of great toe of right foot Likely secondary to fall, had nondisplaced fracture of right great toe. - Supportive care  Macrocytic anemia Hemoglobin seems stable. - Continue home supplement -Continue to monitor  Depression Patient was not taking Paxil  currently.  Consultants: Cardiology Procedures performed: None Disposition: Home health Diet recommendation:  Discharge Diet Orders (From admission, onward)     Start     Ordered   11/22/24 0000  Diet - low sodium heart healthy        11/22/24 1026           Cardiac and Carb modified diet DISCHARGE MEDICATION: Allergies as of 11/22/2024       Reactions   Adhesive [tape] Dermatitis   Skin is thin so  it is tough to take off.  Use paper tape   Azithromycin Nausea Only   REACTION: nausea   Celecoxib Other (See Comments)   Raises blood pressure   Zocor  [simvastatin ] Other (See Comments)   Muscle pain        Medication List     PAUSE taking these medications    torsemide 10 MG tablet Wait to take this until: November 25, 2024 Commonly known as: DEMADEX Take 20 mg by mouth daily.       STOP taking these medications    Semaglutide(0.25 or 0.5MG /DOS) 2 MG/3ML Sopn       TAKE these medications    acetaminophen  650 MG CR tablet Commonly known as: TYLENOL  Take 650 mg by mouth every 8 (eight) hours as needed.   amoxicillin -clavulanate 875-125 MG tablet Commonly known as: AUGMENTIN  Take 1 tablet by mouth 2 (two) times daily for 3 days. Notes to patient: Take 12 hours apart   aspirin  81 MG tablet Take 81 mg by mouth daily.   FeroSul 325 (65 Fe) MG tablet Generic drug: ferrous sulfate  TAKE ONE TABLET BY MOUTH ONCE A DAY WITH FOOD.   finasteride  5 MG tablet Commonly known as: PROSCAR  Take 1 tablet (5 mg total) by mouth daily.   FreeStyle Libre 3 Reader [place] 1 each by Does not apply route as directed.  Use with sensors to monitor glucose continuously   FreeStyle Libre 3 Sensor Misc Place 1 sensor on the skin every 14 days. Use to check glucose continuously   glimepiride  4 MG tablet Commonly known as: AMARYL  Take 8 mg by mouth daily with breakfast.   levothyroxine  75 MCG tablet Commonly known as: SYNTHROID  TAKE ONE TABLET (75 MCG TOTAL) BY MOUTH DAILY BEFORE BREAKFAST.   lidocaine  5 % Commonly known as: LIDODERM  Place 1 patch onto the skin daily. Remove & Discard patch within 12 hours or as directed by MD   NovoLOG  Mix 70/30 FlexPen (70-30) 100 UNIT/ML FlexPen Generic drug: insulin  aspart protamine - aspart Inject 9 Units into the skin daily with breakfast.   pantoprazole  40 MG tablet Commonly known as: PROTONIX  TAKE ONE TABLET BY MOUTH ONCE A DAY    rosuvastatin  5 MG tablet Commonly known as: CRESTOR  TAKE ONE TABLET BY MOUTH EVERY OTHER DAY   senna 8.6 MG tablet Commonly known as: SENOKOT Take 1 tablet by mouth 2 (two) times daily.   tamsulosin  0.4 MG Caps capsule Commonly known as: FLOMAX  Take 2 capsules (0.8 mg total) by mouth daily.   VITAMIN D  PO Take 2,000 Units by mouth daily.               Discharge Care Instructions  (From admission, onward)           Start     Ordered   11/22/24 0000  Discharge wound care:       Comments: Cleanse L elbow skin tear with NS, apply Vaseline gauze ([place] [phone]) to wound bed daily and secure with silicone foam or Telfa and Kerlix roll gauze whichever is preferred/works best.  SOAK DRESSING WITH NORMAL SALINE IF ADHERED TO WOUND BED FOR ATRAUMATIC REMOVAL.  11/21/24 0700   11/22/24 1026            Follow-up Information     Tower, Laine LABOR, MD Follow up.   Specialties: Family Medicine, Radiology Why: hospital follow up Contact information: [address] [place] [address] [phone]         Ammon Blunt, MD. Go in 1 week(s).   Specialty: Cardiology Contact information: [address] [facility] [place] [address] [phone]                Discharge Exam: Fredricka Weights   11/20/24 1509 11/20/24 2217 11/22/24 0500  Weight: 63.5 kg 65 kg 66.7 kg   General.  Frail elderly man, in no acute distress. Pulmonary.  Lungs clear bilaterally, normal respiratory effort. CV.  Regular rate and rhythm, no JVD, rub or murmur. Abdomen.  Soft, nontender, nondistended, BS positive. CNS.  Alert and oriented .  No focal neurologic deficit. Extremities.  No edema, no cyanosis, pulses intact and symmetrical. Psychiatry.  Judgment and insight appears normal.   Condition at discharge: stable  The results of significant diagnostics from this hospitalization (including imaging, microbiology, ancillary and laboratory)  are listed below for reference.   Imaging Studies: ECHOCARDIOGRAM COMPLETE Result Date: 11/21/2024    ECHOCARDIOGRAM REPORT   Patient Name:   AMAN BONET Date of Exam: 11/21/2024 Medical Rec #:  [id number]        Height:       67.0 in Accession #:    [id number]       Weight:       143.3 lb Date of Birth:  1926/09/17        BSA:  1.755 m Patient Age:    89 years         BP:           132/61 mmHg Patient Gender: M                HR:           68 bpm. Exam Location:  [facility] Procedure: 2D Echo, Cardiac Doppler and Color Doppler (Both Spectral and Color            Flow Doppler were utilized during procedure). Indications:     Syncope R55  History:         Patient has no prior history of Echocardiogram examinations.                  CAD; Signs/Symptoms:Syncope.  Sonographer:     Ashley McNeely-Sloane Referring Phys:  [id number] Mio Schellinger Diagnosing Phys: Dwayne D Callwood MD IMPRESSIONS  1. HCM variant ,consider cardiac MRI.  2. Left ventricular ejection fraction, by estimation, is 45 to 50%. The left ventricle has mildly decreased function. The left ventricle has no regional wall motion abnormalities. There is severe asymmetric left ventricular hypertrophy of the basal and inferior segments. Left ventricular diastolic parameters are consistent with Grade I diastolic dysfunction (impaired relaxation).  3. Right ventricular systolic function is normal. The right ventricular size is normal.  4. The mitral valve is abnormal. Mild mitral valve regurgitation.  5. The aortic valve is calcified. Aortic valve regurgitation is trivial. Aortic valve sclerosis/calcification is present, without any evidence of aortic stenosis. FINDINGS  Left Ventricle: Left ventricular ejection fraction, by estimation, is 45 to 50%. The left ventricle has mildly decreased function. The left ventricle has no regional wall motion abnormalities. Strain was performed and the global longitudinal strain is indeterminate. The left ventricular  internal cavity size was normal in size. There is severe asymmetric left ventricular hypertrophy of the basal and inferior segments. Left ventricular diastolic parameters are consistent with Grade I diastolic dysfunction (impaired relaxation). Right Ventricle: The right ventricular size is normal. No increase in right ventricular wall thickness. Right ventricular systolic function is normal. Left Atrium: Left atrial size was normal in size. Right Atrium: Right atrial size was normal in size. Pericardium: There is no evidence of pericardial effusion. Mitral Valve: The mitral valve is abnormal. Mild mitral valve regurgitation. MV peak gradient, 3.9 mmHg. The mean mitral valve gradient is 1.0 mmHg. Tricuspid Valve: The tricuspid valve is normal in structure. Tricuspid valve regurgitation is mild. Aortic Valve: The aortic valve is calcified. Aortic valve regurgitation is trivial. Aortic valve sclerosis/calcification is present, without any evidence of aortic stenosis. Aortic valve mean gradient measures 3.0 mmHg. Aortic valve peak gradient measures 6.4 mmHg. Aortic valve area, by VTI measures 2.71 cm. Pulmonic Valve: The pulmonic valve was normal in structure. Pulmonic valve regurgitation is trivial. Aorta: The ascending aorta was not well visualized. IAS/Shunts: No atrial level shunt detected by color flow Doppler. Additional Comments: HCM variant ,consider cardiac MRI. 3D was performed not requiring image post processing on an independent workstation and was indeterminate.  LEFT VENTRICLE PLAX 2D LVIDd:         4.25 cm     Diastology LVIDs:         3.60 cm     LV e' medial:    6.09 cm/s LV PW:         1.95 cm     LV E/e' medial:  9.9 LV IVS:  1.25 cm     LV e' lateral:   9.68 cm/s LVOT diam:     2.40 cm     LV E/e' lateral: 6.2 LV SV:         70 LV SV Index:   40 LVOT Area:     4.52 cm LV IVRT:       144 msec  LV Volumes (MOD) LV vol d, MOD A2C: 31.6 ml LV vol d, MOD A4C: 49.4 ml LV vol s, MOD A2C: 20.8 ml LV  vol s, MOD A4C: 22.2 ml LV SV MOD A2C:     10.8 ml LV SV MOD A4C:     49.4 ml LV SV MOD BP:      16.7 ml RIGHT VENTRICLE RV Basal diam:  4.40 cm RV Mid diam:    3.00 cm RV S prime:     10.40 cm/s TAPSE (M-mode): 1.8 cm LEFT ATRIUM             Index        RIGHT ATRIUM           Index LA diam:        3.70 cm 2.11 cm/m   RA Area:     12.60 cm LA Vol (A2C):   32.8 ml 18.69 ml/m  RA Volume:   23.20 ml  13.22 ml/m LA Vol (A4C):   46.3 ml 26.38 ml/m LA Biplane Vol: 40.2 ml 22.90 ml/m  AORTIC VALVE                    PULMONIC VALVE AV Area (Vmax):    2.56 cm     PV Vmax:        1.01 m/s AV Area (Vmean):   2.54 cm     PV Vmean:       67.000 cm/s AV Area (VTI):     2.71 cm     PV VTI:         0.214 m AV Vmax:           126.00 cm/s  PV Peak grad:   4.1 mmHg AV Vmean:          85.900 cm/s  PV Mean grad:   2.0 mmHg AV VTI:            0.259 m      RVOT Peak grad: 3 mmHg AV Peak Grad:      6.4 mmHg AV Mean Grad:      3.0 mmHg LVOT Vmax:         71.30 cm/s LVOT Vmean:        48.200 cm/s LVOT VTI:          0.155 m LVOT/AV VTI ratio: 0.60  AORTA Ao Root diam: 3.80 cm Ao Asc diam:  3.60 cm MITRAL VALVE MV Area (PHT): 3.12 cm    SHUNTS MV Area VTI:   3.09 cm    Systemic VTI:  0.16 m MV Peak grad:  3.9 mmHg    Systemic Diam: 2.40 cm MV Mean grad:  1.0 mmHg    Pulmonic VTI:  0.186 m MV Vmax:       0.99 m/s MV Vmean:      58.4 cm/s MV Decel Time: 243 msec MV E velocity: 60.30 cm/s MV A velocity: 82.80 cm/s MV E/A ratio:  0.73 Dwayne D Callwood MD Electronically signed by Cara JONETTA Lovelace MD Signature Date/Time: 11/21/2024/5:44:19 PM    Final    CT RENAL  STONE STUDY Result Date: 11/21/2024 EXAM: CT UROGRAM 11/20/2024 10:58:14 PM TECHNIQUE: CT of the abdomen and pelvis was performed without the administration of intravenous contrast as per CT urogram protocol. Automated exposure control, iterative reconstruction, and/or weight based adjustment of the mA/kV was utilized to reduce the radiation dose to as low as reasonably  achievable. COMPARISON: Comparison with 03/25/2021. CLINICAL HISTORY: Urolithiasis, symptomatic. Calcification on same day x-rays. FINDINGS: LOWER CHEST: Nondisplaced fracture of the anterior left 7th rib and minimal displaced fracture of the anterior left 6th. Nondisplaced fracture of the anterior right 8th rib. LIVER: The liver is unremarkable. GALLBLADDER AND BILE DUCTS: Gallbladder is unremarkable. No biliary ductal dilatation. SPLEEN: No acute abnormality. PANCREAS: The calcification in the right abdomen seen on same day x-ray corresponds to a coarse pancreatic or nodal calcification. ADRENAL GLANDS: No acute abnormality. KIDNEYS, URETERS AND BLADDER: No stones in the kidneys or ureters. No hydronephrosis. No perinephric or periureteral stranding. Urinary bladder is unremarkable. GI AND BOWEL: Extensive sigmoid diverticulosis. Possible trace adjacent stranding compatible with mild sigmoid diverticulitis. Stomach demonstrates no acute abnormality. There is no bowel obstruction. PERITONEUM AND RETROPERITONEUM: No ascites. No free air. VASCULATURE: Aorta is normal in caliber. Aortic atherosclerotic calcification. LYMPH NODES: The calcification in the right abdomen seen on same day x-ray corresponds to a coarse pancreatic or nodal calcification. REPRODUCTIVE ORGANS: No acute abnormality. BONES AND SOFT TISSUES: Nondisplaced fracture of the anterior left 7th rib and minimal displaced fracture of the anterior left 6th. Nondisplaced fracture of the anterior right 8th rib. No focal soft tissue abnormality. IMPRESSION: 1. Fractures of the right anterior 8th rib and left anterior 6th and 7th ribs. 2. Possible mild sigmoid diverticulitis. Electronically signed by: Norman Gatlin MD 11/21/2024 01:01 AM EST RP Workstation: [id number]   DG Elbow 2 Views Left Result Date: 11/20/2024 EXAM: 1 or 2 VIEW(S) XRAY OF THE LEFT ELBOW COMPARISON: None available. CLINICAL HISTORY: Left elbow pain. FINDINGS: BONES AND JOINTS: No  acute fracture. No focal osseous lesion. No joint dislocation. No joint effusion. Olecranon enthesophytes in the left elbow. SOFT TISSUES: The soft tissues are unremarkable. IMPRESSION: 1. No acute fracture or dislocation. Electronically signed by: Norman Gatlin MD 11/20/2024 09:39 PM EST RP Workstation: [id number]   DG Ribs Unilateral Right Result Date: 11/20/2024 EXAM: _VIEWS_ VIEW(S) XRAY OF THE RIGHT RIBS 11/20/2024 09:06:00 PM COMPARISON: None available. CLINICAL HISTORY: Rib pain on right side. FINDINGS: BONES: No acute displaced rib fracture. LUNGS AND PLEURA: Visualized lungs demonstrate no acute abnormality. VISUALIZED UPPER ABDOMEN: 8 mm calcification projects to the right of the L2 VERTEBRA, possibly a renal calculus. IMPRESSION: 1. No acute displaced right rib fracture. 2. 8 mm calcification projecting to the right of L2, suspicious for right renal calculus; consider clinical correlation for renal colic and dedicated imaging if indicated. Electronically signed by: Norman Gatlin MD 11/20/2024 09:38 PM EST RP Workstation: [id number]   DG Toe Great Right Result Date: 11/20/2024 CLINICAL DATA:  Great toe pain, fall. EXAM: RIGHT GREAT TOE COMPARISON:  None Available. FINDINGS: There is an acute transverse fracture through the proximal aspect of the first distal phalanx. This is nondisplaced. There is no dislocation. Joint spaces are maintained. Soft tissue swelling is present surrounding the fracture. IMPRESSION: Acute nondisplaced fracture through the proximal aspect of the first distal phalanx. Electronically Signed   By: Greig Pique M.D.   On: 11/20/2024 21:35   DG Ribs Unilateral W/Chest Left Result Date: 11/20/2024 CLINICAL DATA:  Status post fall with left-sided rib pain EXAM: LEFT RIBS  AND CHEST - 3+ VIEW COMPARISON:  Chest radiograph dated 10/15/2021 FINDINGS: No radiographic finding of acute displaced fracture. There is no evidence of pneumothorax or pleural effusion. Both lungs are  clear. Heart size and mediastinal contours are within normal limits. IMPRESSION: No radiographic finding of acute displaced fracture. Electronically Signed   By: Limin  Xu M.D.   On: 11/20/2024 16:06   CT Head Wo Contrast Result Date: 11/20/2024 CLINICAL DATA:  Clemens today with trauma to the head and neck. EXAM: CT HEAD WITHOUT CONTRAST CT CERVICAL SPINE WITHOUT CONTRAST TECHNIQUE: Multidetector CT imaging of the head and cervical spine was performed following the standard protocol without intravenous contrast. Multiplanar CT image reconstructions of the cervical spine were also generated. RADIATION DOSE REDUCTION: This exam was performed according to the departmental dose-optimization program which includes automated exposure control, adjustment of the mA and/or kV according to patient size and/or use of iterative reconstruction technique. COMPARISON:  None Available. FINDINGS: CT HEAD FINDINGS Brain: Age related volume loss. No sign of acute infarction, mass lesion, hemorrhage, hydrocephalus or extra-axial collection. Small-vessel change of the white matter with old lacunar infarctions in the external capsule regions. Vascular: There is atherosclerotic calcification of the major vessels at the base of the brain. Skull: No significant finding. Sinuses/Orbits: Clear/normal Other: None CT CERVICAL SPINE FINDINGS Alignment: No traumatic malalignment. Skull base and vertebrae: No fracture or focal bone lesion. Chronic fusion at the C2-3 level. Chronic fusion from C4 through C6. Soft tissues and spinal canal: Negative Disc levels: Ordinary degenerative arthritis at the C1-2 articulation. Chronic degenerative spondylosis C3-4 and C6-7. No compressive central canal stenosis. Upper chest: Negative Other: None IMPRESSION: HEAD CT: No acute or traumatic finding. Age related volume loss. Chronic small-vessel ischemic changes of the white matter. CERVICAL SPINE CT: No acute or traumatic finding. Chronic degenerative changes  as outlined above. Electronically Signed   By: Oneil Officer M.D.   On: 11/20/2024 15:38   CT Cervical Spine Wo Contrast Result Date: 11/20/2024 CLINICAL DATA:  Clemens today with trauma to the head and neck. EXAM: CT HEAD WITHOUT CONTRAST CT CERVICAL SPINE WITHOUT CONTRAST TECHNIQUE: Multidetector CT imaging of the head and cervical spine was performed following the standard protocol without intravenous contrast. Multiplanar CT image reconstructions of the cervical spine were also generated. RADIATION DOSE REDUCTION: This exam was performed according to the departmental dose-optimization program which includes automated exposure control, adjustment of the mA and/or kV according to patient size and/or use of iterative reconstruction technique. COMPARISON:  None Available. FINDINGS: CT HEAD FINDINGS Brain: Age related volume loss. No sign of acute infarction, mass lesion, hemorrhage, hydrocephalus or extra-axial collection. Small-vessel change of the white matter with old lacunar infarctions in the external capsule regions. Vascular: There is atherosclerotic calcification of the major vessels at the base of the brain. Skull: No significant finding. Sinuses/Orbits: Clear/normal Other: None CT CERVICAL SPINE FINDINGS Alignment: No traumatic malalignment. Skull base and vertebrae: No fracture or focal bone lesion. Chronic fusion at the C2-3 level. Chronic fusion from C4 through C6. Soft tissues and spinal canal: Negative Disc levels: Ordinary degenerative arthritis at the C1-2 articulation. Chronic degenerative spondylosis C3-4 and C6-7. No compressive central canal stenosis. Upper chest: Negative Other: None IMPRESSION: HEAD CT: No acute or traumatic finding. Age related volume loss. Chronic small-vessel ischemic changes of the white matter. CERVICAL SPINE CT: No acute or traumatic finding. Chronic degenerative changes as outlined above. Electronically Signed   By: Oneil Officer M.D.   On: 11/20/2024 15:38  Microbiology: Results for orders placed or performed during the hospital encounter of 11/20/24  Resp panel by RT-PCR (RSV, Flu A&B, Covid) Anterior Nasal Swab     Status: None   Collection Time: 11/20/24  3:12 PM   Specimen: Anterior Nasal Swab  Result Value Ref Range Status   SARS Coronavirus 2 by RT PCR NEGATIVE NEGATIVE Final    Comment: (NOTE) SARS-CoV-2 target nucleic acids are NOT DETECTED.  The SARS-CoV-2 RNA is generally detectable in upper respiratory specimens during the acute phase of infection. The lowest concentration of SARS-CoV-2 viral copies this assay can detect is 138 copies/mL. A negative result does not preclude SARS-Cov-2 infection and should not be used as the sole basis for treatment or other patient management decisions. A negative result may occur with  improper specimen collection/handling, submission of specimen other than nasopharyngeal swab, presence of viral mutation(s) within the areas targeted by this assay, and inadequate number of viral copies(<138 copies/mL). A negative result must be combined with clinical observations, patient history, and epidemiological information. The expected result is Negative.  Fact Sheet for Patients:  bloggercourse.com  Fact Sheet for Healthcare Providers:  seriousbroker.it  This test is no t yet approved or cleared by the [place]  FDA and  has been authorized for detection and/or diagnosis of SARS-CoV-2 by FDA under an Emergency Use Authorization (EUA). This EUA will remain  in effect (meaning this test can be used) for the duration of the COVID-19 declaration under Section 564(b)(1) of the Act, 21 U.S.C.section 360bbb-3(b)(1), unless the authorization is terminated  or revoked sooner.       Influenza A by PCR NEGATIVE NEGATIVE Final   Influenza B by PCR NEGATIVE NEGATIVE Final    Comment: (NOTE) The Xpert Xpress SARS-CoV-2/FLU/RSV plus assay is  intended as an aid in the diagnosis of influenza from Nasopharyngeal swab specimens and should not be used as a sole basis for treatment. Nasal washings and aspirates are unacceptable for Xpert Xpress SARS-CoV-2/FLU/RSV testing.  Fact Sheet for Patients: bloggercourse.com  Fact Sheet for Healthcare Providers: seriousbroker.it  This test is not yet approved or cleared by the [place]  FDA and has been authorized for detection and/or diagnosis of SARS-CoV-2 by FDA under an Emergency Use Authorization (EUA). This EUA will remain in effect (meaning this test can be used) for the duration of the COVID-19 declaration under Section 564(b)(1) of the Act, 21 U.S.C. section 360bbb-3(b)(1), unless the authorization is terminated or revoked.     Resp Syncytial Virus by PCR NEGATIVE NEGATIVE Final    Comment: (NOTE) Fact Sheet for Patients: bloggercourse.com  Fact Sheet for Healthcare Providers: seriousbroker.it  This test is not yet approved or cleared by the [place]  FDA and has been authorized for detection and/or diagnosis of SARS-CoV-2 by FDA under an Emergency Use Authorization (EUA). This EUA will remain in effect (meaning this test can be used) for the duration of the COVID-19 declaration under Section 564(b)(1) of the Act, 21 U.S.C. section 360bbb-3(b)(1), unless the authorization is terminated or revoked.  Performed at [facility], [address] [place], [place] [address]   Culture, blood (Routine X 2) w Reflex to ID Panel     Status: None (Preliminary result)   Collection Time: 11/21/24  1:49 AM   Specimen: BLOOD  Result Value Ref Range Status   Specimen Description BLOOD BLOOD LEFT FOREARM  Final   Special Requests   Final    BOTTLES DRAWN AEROBIC AND ANAEROBIC Blood Culture adequate volume  Culture   Final    NO GROWTH 1 DAY Performed at [facility], [address] [place], [place] [address]    Report Status PENDING  Incomplete  Culture, blood (Routine X 2) w Reflex to ID Panel     Status: None (Preliminary result)   Collection Time: 11/21/24  1:49 AM   Specimen: BLOOD  Result Value Ref Range Status   Specimen Description BLOOD LWRIST  Final   Special Requests   Final    BOTTLES DRAWN AEROBIC AND ANAEROBIC Blood Culture adequate volume   Culture   Final    NO GROWTH 1 DAY Performed at [facility], [address] [place], [place] [address]    Report Status PENDING  Incomplete    Labs: CBC: Recent Labs  Lab 11/20/24 1509 11/21/24 0101 11/22/24 0504  WBC 13.3* 10.1 8.1  HGB 11.8* 10.1* 10.0*  HCT 36.0* 30.5* 30.4*  MCV 103.4* 102.3* 104.1*  PLT 198 177 169   Basic Metabolic Panel: Recent Labs  Lab 11/20/24 1509 11/20/24 1821 11/21/24 0101 11/22/24 0504  NA 142  --  144 142  K 4.4  --  4.3 4.3  CL 106  --  109 110  CO2 25  --  26 23  GLUCOSE 192*  --  176* 151*  BUN 32*  --  30* 32*  CREATININE 1.73*  --  1.95* 1.63*  CALCIUM  9.7  --  9.1 9.0  MG  --  1.9  --   --    Liver Function Tests: Recent Labs  Lab 11/20/24 1509  AST 37  ALT 56*  ALKPHOS 112  BILITOT 0.7  PROT 6.6  ALBUMIN 4.1   CBG: Recent Labs  Lab 11/21/24 0858 11/21/24 1219 11/21/24 1620 11/21/24 2129 11/22/24 0754  GLUCAP 129* 254* 123* 128* 150*    Discharge time spent: greater than 30 minutes.  This record has been created using Conservation officer, historic buildings. Errors have been sought and corrected,but may not always be located. Such creation errors do not reflect on the standard of care.   Signed: Amaryllis Dare, MD [facility] 11/22/2024

## 2024-11-22 NOTE — TOC Transition Note (Signed)
 Transition of Care [facility]) - Discharge Note   Patient Details  Name: David Henry MRN: [id number] Date of Birth: 02-Jan-1926  [facility] CM/SW Contact:  Daved JONETTA Hamilton, RN Phone Number: 11/22/2024, 11:00 AM   Clinical Narrative:     Patient will DC to: Home Anticipated DC date: 11/22/2024 Family notified: [name] Koonce Transport by: Greig Pereyra  Per MD patient ready for DC to HOME. RN, patient, patient's family notified of DC.    TOC signing off.   Final next level of care: Home/Self Care Barriers to Discharge: Barriers Resolved   Patient Goals and CMS Choice     Choice offered to / list presented to : Adult Children      Discharge Placement                  Name of family member notified: [name] Koonce Patient and family notified of of transfer: 11/22/24  Discharge Plan and Services Additional resources added to the After Visit Summary for     Discharge Planning Services: CM Consult                                 Social Drivers of Health (SDOH) Interventions SDOH Screenings   Food Insecurity: No Food Insecurity (11/20/2024)  Housing: Low Risk  (11/20/2024)  Transportation Needs: No Transportation Needs (11/20/2024)  Utilities: Not At Risk (11/20/2024)  Alcohol Screen: Low Risk  (08/21/2023)  Depression (PHQ2-9): Medium Risk (03/17/2024)  Financial Resource Strain: Low Risk  (09/29/2023)  Physical Activity: Inactive (08/21/2023)  Social Connections: Moderately Isolated (11/20/2024)  Stress: No Stress Concern Present (08/21/2023)  Tobacco Use: Low Risk  (11/20/2024)  Health Literacy: Adequate Health Literacy (08/21/2023)     Readmission Risk Interventions     No data to display

## 2024-11-22 NOTE — Progress Notes (Signed)
 Mobility Specialist Progress Note:    11/22/24 0857  Mobility  Activity Ambulated with assistance  Level of Assistance Standby assist, set-up cues, supervision of patient - no hands on  Assistive Device Front wheel walker  Distance Ambulated (ft) 180 ft  Range of Motion/Exercises Active;All extremities  Activity Response Tolerated well  Mobility visit 1 Mobility  Mobility Specialist Start Time (ACUTE ONLY) [id number]  Mobility Specialist Stop Time (ACUTE ONLY) 0857  Mobility Specialist Time Calculation (min) (ACUTE ONLY) 11 min   Pt received sitting EOB, agreeable to mobility. Required supervision to stand and ambulate with RW. Tolerated well, asx throughout. Returned to room, left pt supine. All needs met.  Sherrilee Ditty Mobility Specialist Please contact via Special Educational Needs Teacher or  Rehab office at [phone]

## 2024-11-22 NOTE — Plan of Care (Signed)
  Problem: Metabolic: Goal: Ability to maintain appropriate glucose levels will improve Outcome: Progressing   Problem: Tissue Perfusion: Goal: Adequacy of tissue perfusion will improve Outcome: Progressing   Problem: Clinical Measurements: Goal: Will remain free from infection Outcome: Progressing   Problem: Nutrition: Goal: Adequate nutrition will be maintained Outcome: Progressing

## 2024-11-22 NOTE — Progress Notes (Signed)
 [facility] CLINIC CARDIOLOGY PROGRESS NOTE       Patient ID: David Henry MRN: [id number] DOB/AGE: 88-Dec-1927 88 y.o.  Admit date: 11/20/2024 Referring Physician Dr. Amaryllis Dare Primary Physician Tower, Laine LABOR, MD  Primary Cardiologist Dr. Ammon Reason for Consultation syncope  HPI: David Henry is a 89 y.o. male  with a past medical history of coronary artery disease s/p PCI and stent of LAD and LCx 2006,  moderate mitral insufficiency, bradycardia, hypertension, hyperlipidemia, type 2 diabetes who presented to the ED on 11/20/2024 for syncope. Cardiology was consulted for further evaluation.   Interval history: -Patient seen and examined this AM, resting comfortably in hospital bed with daughter at bedside.  -Denies any issues this AM. Feeling well without CP, SOB, dizziness/lightheadedness.  -Tele with no significant events. One episode of tachycardia to 120s overnight when he was up to urinate.  -Echo results reviewed with patient and daughter.  Review of systems complete and found to be negative unless listed above    Past Medical History:  Diagnosis Date   Arthritis    Basal cell carcinoma 08/15/2021   right cheek, EDC   CAD (coronary artery disease)    3 stents   Colon polyp    Diabetes mellitus    type II   Dyspnea    with exertion   GERD (gastroesophageal reflux disease)    Hyperlipidemia    Hypertension    Hypothyroidism    Kidney stones    stage III CKD   Neuropathy    Peripheral vascular disease    neuropathy in feet    Past Surgical History:  Procedure Laterality Date   APPENDECTOMY  1984   CATARACT EXTRACTION Bilateral 04/2001   CHOLECYSTECTOMY  1989   COLONOSCOPY N/A 2/14   hemorrhoids and tics (after pos ifob card)   CORONARY ANGIOPLASTY  2006   3 stents   CYSTOSCOPY/URETEROSCOPY/HOLMIUM LASER/STENT PLACEMENT Left 03/24/2021   Procedure: CYSTOSCOPY/URETEROSCOPY, RETROGRADE AND /STENT PLACEMENT;  Surgeon: Twylla Glendia BROCKS, MD;   Location: [facility] ORS;  Service: Urology;  Laterality: Left;   ESOPHAGOGASTRODUODENOSCOPY N/A 2/14   RETINAL DETACHMENT SURGERY Right 2003   SHOULDER ARTHROSCOPY WITH ROTATOR CUFF REPAIR AND SUBACROMIAL DECOMPRESSION Right 12/30/2017   Procedure: SHOULDER ARTHROSCOPY WITH ROTATOR CUFF REPAIR AND SUBACROMIAL DECOMPRESSION;  Surgeon: Maryl Barters, MD;  Location: [facility] ORS;  Service: Orthopedics;  Laterality: Right;   SHOULDER SURGERY  2012   Lt shoulder repair    Medications Prior to Admission  Medication Sig Dispense Refill Last Dose/Taking   acetaminophen  (TYLENOL ) 650 MG CR tablet Take 650 mg by mouth every 8 (eight) hours as needed.   Unknown   aspirin  81 MG tablet Take 81 mg by mouth daily.   11/20/2024   FEROSUL 325 (65 Fe) MG tablet TAKE ONE TABLET BY MOUTH ONCE A DAY WITH FOOD. 90 tablet 2 11/20/2024   finasteride  (PROSCAR ) 5 MG tablet Take 1 tablet (5 mg total) by mouth daily. 90 tablet 3 11/20/2024   glimepiride  (AMARYL ) 4 MG tablet Take 8 mg by mouth daily with breakfast.   11/20/2024 Morning   levothyroxine  (SYNTHROID ) 75 MCG tablet TAKE ONE TABLET (75 MCG TOTAL) BY MOUTH DAILY BEFORE BREAKFAST. 90 tablet 1 11/20/2024   NOVOLOG  MIX 70/30 FLEXPEN (70-30) 100 UNIT/ML FlexPen Inject 9 Units into the skin daily with breakfast.   11/20/2024   pantoprazole  (PROTONIX ) 40 MG tablet TAKE ONE TABLET BY MOUTH ONCE A DAY 90 tablet 1 11/20/2024   rosuvastatin  (CRESTOR ) 5 MG tablet TAKE  ONE TABLET BY MOUTH EVERY OTHER DAY 45 tablet 3 11/19/2024   senna (SENOKOT) 8.6 MG tablet Take 1 tablet by mouth 2 (two) times daily.   11/20/2024   tamsulosin  (FLOMAX ) 0.4 MG CAPS capsule Take 2 capsules (0.8 mg total) by mouth daily. 180 capsule 3 11/20/2024   torsemide (DEMADEX) 10 MG tablet Take 20 mg by mouth daily.   11/20/2024   VITAMIN D  PO Take 2,000 Units by mouth daily.   11/20/2024   Continuous Blood Gluc Receiver (FREESTYLE LIBRE 3 READER) DEVI 1 each by Does not apply route as directed. Use with  sensors to monitor glucose continuously 1 each 0    Continuous Blood Gluc Sensor (FREESTYLE LIBRE 3 SENSOR) MISC Place 1 sensor on the skin every 14 days. Use to check glucose continuously 6 each 3    Semaglutide,0.25 or 0.5MG /DOS, 2 MG/3ML SOPN Inject into the skin. (Patient not taking: Reported on 11/20/2024)   Not Taking   Social History   Socioeconomic History   Marital status: Married    Spouse name: Not on file   Number of children: Not on file   Years of education: Not on file   Highest education level: Not on file  Occupational History   Not on file  Tobacco Use   Smoking status: Never    Passive exposure: Never   Smokeless tobacco: Never  Vaping Use   Vaping status: Never Used  Substance and Sexual Activity   Alcohol use: No    Alcohol/week: 0.0 standard drinks of alcohol   Drug use: No   Sexual activity: Never  Other Topics Concern   Not on file  Social History Narrative   Not on file   Social Drivers of Health   Financial Resource Strain: Low Risk  (09/29/2023)   Overall Financial Resource Strain (CARDIA)    Difficulty of Paying Living Expenses: Not hard at all  Food Insecurity: No Food Insecurity (11/20/2024)   Hunger Vital Sign    Worried About Running Out of Food in the Last Year: Never true    Ran Out of Food in the Last Year: Never true  Transportation Needs: No Transportation Needs (11/20/2024)   PRAPARE - Administrator, Civil Service (Medical): No    Lack of Transportation (Non-Medical): No  Physical Activity: Inactive (08/21/2023)   Exercise Vital Sign    Days of Exercise per Week: 0 days    Minutes of Exercise per Session: 0 min  Stress: No Stress Concern Present (08/21/2023)   [employer] of Occupational Health - Occupational Stress Questionnaire    Feeling of Stress : Only a little  Social Connections: Moderately Isolated (11/20/2024)   Social Connection and Isolation Panel    Frequency of Communication with Friends and Family:  More than three times a week    Frequency of Social Gatherings with Friends and Family: More than three times a week    Attends Religious Services: Never    Database Administrator or Organizations: No    Attends Banker Meetings: Never    Marital Status: Married  Catering Manager Violence: Not At Risk (11/20/2024)   Humiliation, Afraid, Rape, and Kick questionnaire    Fear of Current or Ex-Partner: No    Emotionally Abused: No    Physically Abused: No    Sexually Abused: No    Family History  Problem Relation Age of Onset   Cancer Brother        prostate CA  Heart disease Mother    Stroke Father    Kidney disease Sister    Kidney disease Sister    Clotting disorder Sister    COPD Brother    Heart disease Brother      Vitals:   11/22/24 0020 11/22/24 0419 11/22/24 0500 11/22/24 0754  BP: (!) 117/59 136/82  (!) 141/56  Pulse: 72 73  70  Resp: 15 15  17   Temp: 98.8 F (37.1 C) (!) 97.2 F (36.2 C)  98.1 F (36.7 C)  TempSrc:  Oral    SpO2: 97%   96%  Weight:   66.7 kg   Height:        PHYSICAL EXAM General: Well appearing elderly male, well nourished, in no acute distress. HEENT: Normocephalic and atraumatic. Neck: No JVD.  Lungs: Normal respiratory effort on room air. Clear bilaterally to auscultation. No wheezes, crackles, rhonchi.  Heart: HRRR. Normal S1 and S2 without gallops or murmurs.  Abdomen: Non-distended appearing.  Msk: Normal strength and tone for age. Extremities: Warm and well perfused. No clubbing, cyanosis. No edema.  Neuro: Alert and oriented X 3. Psych: Answers questions appropriately.   Labs: Basic Metabolic Panel: Recent Labs    11/20/24 1821 11/21/24 0101 11/22/24 0504  NA  --  144 142  K  --  4.3 4.3  CL  --  109 110  CO2  --  26 23  GLUCOSE  --  176* 151*  BUN  --  30* 32*  CREATININE  --  1.95* 1.63*  CALCIUM   --  9.1 9.0  MG 1.9  --   --    Liver Function Tests: Recent Labs    11/20/24 1509  AST 37  ALT  56*  ALKPHOS 112  BILITOT 0.7  PROT 6.6  ALBUMIN 4.1   No results for input(s): LIPASE, AMYLASE in the last 72 hours. CBC: Recent Labs    11/21/24 0101 11/22/24 0504  WBC 10.1 8.1  HGB 10.1* 10.0*  HCT 30.5* 30.4*  MCV 102.3* 104.1*  PLT 177 169   Cardiac Enzymes: No results for input(s): CKTOTAL, CKMB, CKMBINDEX, TROPONINIHS in the last 72 hours. BNP: No results for input(s): BNP in the last 72 hours. D-Dimer: No results for input(s): DDIMER in the last 72 hours. Hemoglobin A1C: No results for input(s): HGBA1C in the last 72 hours. Fasting Lipid Panel: No results for input(s): CHOL, HDL, LDLCALC, TRIG, CHOLHDL, LDLDIRECT in the last 72 hours. Thyroid  Function Tests: No results for input(s): TSH, T4TOTAL, T3FREE, THYROIDAB in the last 72 hours.  Invalid input(s): FREET3 Anemia Panel: No results for input(s): VITAMINB12, FOLATE, FERRITIN, TIBC, IRON , RETICCTPCT in the last 72 hours.   Radiology: ECHOCARDIOGRAM COMPLETE Result Date: 11/21/2024    ECHOCARDIOGRAM REPORT   Patient Name:   David Henry Date of Exam: 11/21/2024 Medical Rec #:  [id number]        Height:       67.0 in Accession #:    [id number]       Weight:       143.3 lb Date of Birth:  [birth year]        BSA:          1.755 m Patient Age:    89 years         BP:           132/61 mmHg Patient Gender: M                HR:  68 bpm. Exam Location:  [facility] Procedure: 2D Echo, Cardiac Doppler and Color Doppler (Both Spectral and Color            Flow Doppler were utilized during procedure). Indications:     Syncope R55  History:         Patient has no prior history of Echocardiogram examinations.                  CAD; Signs/Symptoms:Syncope.  Sonographer:     Ashley McNeely-Sloane Referring Phys:  [id number] SUMAYYA AMIN Diagnosing Phys: Dwayne D Callwood MD IMPRESSIONS  1. HCM variant ,consider cardiac MRI.  2. Left ventricular ejection fraction, by estimation, is 45  to 50%. The left ventricle has mildly decreased function. The left ventricle has no regional wall motion abnormalities. There is severe asymmetric left ventricular hypertrophy of the basal and inferior segments. Left ventricular diastolic parameters are consistent with Grade I diastolic dysfunction (impaired relaxation).  3. Right ventricular systolic function is normal. The right ventricular size is normal.  4. The mitral valve is abnormal. Mild mitral valve regurgitation.  5. The aortic valve is calcified. Aortic valve regurgitation is trivial. Aortic valve sclerosis/calcification is present, without any evidence of aortic stenosis. FINDINGS  Left Ventricle: Left ventricular ejection fraction, by estimation, is 45 to 50%. The left ventricle has mildly decreased function. The left ventricle has no regional wall motion abnormalities. Strain was performed and the global longitudinal strain is indeterminate. The left ventricular internal cavity size was normal in size. There is severe asymmetric left ventricular hypertrophy of the basal and inferior segments. Left ventricular diastolic parameters are consistent with Grade I diastolic dysfunction (impaired relaxation). Right Ventricle: The right ventricular size is normal. No increase in right ventricular wall thickness. Right ventricular systolic function is normal. Left Atrium: Left atrial size was normal in size. Right Atrium: Right atrial size was normal in size. Pericardium: There is no evidence of pericardial effusion. Mitral Valve: The mitral valve is abnormal. Mild mitral valve regurgitation. MV peak gradient, 3.9 mmHg. The mean mitral valve gradient is 1.0 mmHg. Tricuspid Valve: The tricuspid valve is normal in structure. Tricuspid valve regurgitation is mild. Aortic Valve: The aortic valve is calcified. Aortic valve regurgitation is trivial. Aortic valve sclerosis/calcification is present, without any evidence of aortic stenosis. Aortic valve mean gradient  measures 3.0 mmHg. Aortic valve peak gradient measures 6.4 mmHg. Aortic valve area, by VTI measures 2.71 cm. Pulmonic Valve: The pulmonic valve was normal in structure. Pulmonic valve regurgitation is trivial. Aorta: The ascending aorta was not well visualized. IAS/Shunts: No atrial level shunt detected by color flow Doppler. Additional Comments: HCM variant ,consider cardiac MRI. 3D was performed not requiring image post processing on an independent workstation and was indeterminate.  LEFT VENTRICLE PLAX 2D LVIDd:         4.25 cm     Diastology LVIDs:         3.60 cm     LV e' medial:    6.09 cm/s LV PW:         1.95 cm     LV E/e' medial:  9.9 LV IVS:        1.25 cm     LV e' lateral:   9.68 cm/s LVOT diam:     2.40 cm     LV E/e' lateral: 6.2 LV SV:         70 LV SV Index:   40 LVOT Area:     4.52 cm LV  IVRT:       144 msec  LV Volumes (MOD) LV vol d, MOD A2C: 31.6 ml LV vol d, MOD A4C: 49.4 ml LV vol s, MOD A2C: 20.8 ml LV vol s, MOD A4C: 22.2 ml LV SV MOD A2C:     10.8 ml LV SV MOD A4C:     49.4 ml LV SV MOD BP:      16.7 ml RIGHT VENTRICLE RV Basal diam:  4.40 cm RV Mid diam:    3.00 cm RV S prime:     10.40 cm/s TAPSE (M-mode): 1.8 cm LEFT ATRIUM             Index        RIGHT ATRIUM           Index LA diam:        3.70 cm 2.11 cm/m   RA Area:     12.60 cm LA Vol (A2C):   32.8 ml 18.69 ml/m  RA Volume:   23.20 ml  13.22 ml/m LA Vol (A4C):   46.3 ml 26.38 ml/m LA Biplane Vol: 40.2 ml 22.90 ml/m  AORTIC VALVE                    PULMONIC VALVE AV Area (Vmax):    2.56 cm     PV Vmax:        1.01 m/s AV Area (Vmean):   2.54 cm     PV Vmean:       67.000 cm/s AV Area (VTI):     2.71 cm     PV VTI:         0.214 m AV Vmax:           126.00 cm/s  PV Peak grad:   4.1 mmHg AV Vmean:          85.900 cm/s  PV Mean grad:   2.0 mmHg AV VTI:            0.259 m      RVOT Peak grad: 3 mmHg AV Peak Grad:      6.4 mmHg AV Mean Grad:      3.0 mmHg LVOT Vmax:         71.30 cm/s LVOT Vmean:        48.200 cm/s LVOT VTI:           0.155 m LVOT/AV VTI ratio: 0.60  AORTA Ao Root diam: 3.80 cm Ao Asc diam:  3.60 cm MITRAL VALVE MV Area (PHT): 3.12 cm    SHUNTS MV Area VTI:   3.09 cm    Systemic VTI:  0.16 m MV Peak grad:  3.9 mmHg    Systemic Diam: 2.40 cm MV Mean grad:  1.0 mmHg    Pulmonic VTI:  0.186 m MV Vmax:       0.99 m/s MV Vmean:      58.4 cm/s MV Decel Time: 243 msec MV E velocity: 60.30 cm/s MV A velocity: 82.80 cm/s MV E/A ratio:  0.73 Dwayne D Callwood MD Electronically signed by Cara JONETTA Lovelace MD Signature Date/Time: 11/21/2024/5:44:19 PM    Final    CT RENAL STONE STUDY Result Date: 11/21/2024 EXAM: CT UROGRAM 11/20/2024 10:58:14 PM TECHNIQUE: CT of the abdomen and pelvis was performed without the administration of intravenous contrast as per CT urogram protocol. Automated exposure control, iterative reconstruction, and/or weight based adjustment of the mA/kV was utilized to reduce the radiation dose to as low as reasonably  achievable. COMPARISON: Comparison with 03/25/2021. CLINICAL HISTORY: Urolithiasis, symptomatic. Calcification on same day x-rays. FINDINGS: LOWER CHEST: Nondisplaced fracture of the anterior left 7th rib and minimal displaced fracture of the anterior left 6th. Nondisplaced fracture of the anterior right 8th rib. LIVER: The liver is unremarkable. GALLBLADDER AND BILE DUCTS: Gallbladder is unremarkable. No biliary ductal dilatation. SPLEEN: No acute abnormality. PANCREAS: The calcification in the right abdomen seen on same day x-ray corresponds to a coarse pancreatic or nodal calcification. ADRENAL GLANDS: No acute abnormality. KIDNEYS, URETERS AND BLADDER: No stones in the kidneys or ureters. No hydronephrosis. No perinephric or periureteral stranding. Urinary bladder is unremarkable. GI AND BOWEL: Extensive sigmoid diverticulosis. Possible trace adjacent stranding compatible with mild sigmoid diverticulitis. Stomach demonstrates no acute abnormality. There is no bowel obstruction. PERITONEUM AND  RETROPERITONEUM: No ascites. No free air. VASCULATURE: Aorta is normal in caliber. Aortic atherosclerotic calcification. LYMPH NODES: The calcification in the right abdomen seen on same day x-ray corresponds to a coarse pancreatic or nodal calcification. REPRODUCTIVE ORGANS: No acute abnormality. BONES AND SOFT TISSUES: Nondisplaced fracture of the anterior left 7th rib and minimal displaced fracture of the anterior left 6th. Nondisplaced fracture of the anterior right 8th rib. No focal soft tissue abnormality. IMPRESSION: 1. Fractures of the right anterior 8th rib and left anterior 6th and 7th ribs. 2. Possible mild sigmoid diverticulitis. Electronically signed by: Norman Gatlin MD 11/21/2024 01:01 AM EST RP Workstation: [id number]   DG Elbow 2 Views Left Result Date: 11/20/2024 EXAM: 1 or 2 VIEW(S) XRAY OF THE LEFT ELBOW COMPARISON: None available. CLINICAL HISTORY: Left elbow pain. FINDINGS: BONES AND JOINTS: No acute fracture. No focal osseous lesion. No joint dislocation. No joint effusion. Olecranon enthesophytes in the left elbow. SOFT TISSUES: The soft tissues are unremarkable. IMPRESSION: 1. No acute fracture or dislocation. Electronically signed by: Norman Gatlin MD 11/20/2024 09:39 PM EST RP Workstation: [id number]   DG Ribs Unilateral Right Result Date: 11/20/2024 EXAM: _VIEWS_ VIEW(S) XRAY OF THE RIGHT RIBS 11/20/2024 09:06:00 PM COMPARISON: None available. CLINICAL HISTORY: Rib pain on right side. FINDINGS: BONES: No acute displaced rib fracture. LUNGS AND PLEURA: Visualized lungs demonstrate no acute abnormality. VISUALIZED UPPER ABDOMEN: 8 mm calcification projects to the right of the L2 VERTEBRA, possibly a renal calculus. IMPRESSION: 1. No acute displaced right rib fracture. 2. 8 mm calcification projecting to the right of L2, suspicious for right renal calculus; consider clinical correlation for renal colic and dedicated imaging if indicated. Electronically signed by: Norman Gatlin MD  11/20/2024 09:38 PM EST RP Workstation: [id number]   DG Toe Great Right Result Date: 11/20/2024 CLINICAL DATA:  Great toe pain, fall. EXAM: RIGHT GREAT TOE COMPARISON:  None Available. FINDINGS: There is an acute transverse fracture through the proximal aspect of the first distal phalanx. This is nondisplaced. There is no dislocation. Joint spaces are maintained. Soft tissue swelling is present surrounding the fracture. IMPRESSION: Acute nondisplaced fracture through the proximal aspect of the first distal phalanx. Electronically Signed   By: Greig Pique M.D.   On: 11/20/2024 21:35   DG Ribs Unilateral W/Chest Left Result Date: 11/20/2024 CLINICAL DATA:  Status post fall with left-sided rib pain EXAM: LEFT RIBS AND CHEST - 3+ VIEW COMPARISON:  Chest radiograph dated 10/15/2021 FINDINGS: No radiographic finding of acute displaced fracture. There is no evidence of pneumothorax or pleural effusion. Both lungs are clear. Heart size and mediastinal contours are within normal limits. IMPRESSION: No radiographic finding of acute displaced fracture. Electronically Signed   By: Limin  Xu M.D.   On: 11/20/2024 16:06   CT Head Wo Contrast Result Date: 11/20/2024 CLINICAL DATA:  Clemens today with trauma to the head and neck. EXAM: CT HEAD WITHOUT CONTRAST CT CERVICAL SPINE WITHOUT CONTRAST TECHNIQUE: Multidetector CT imaging of the head and cervical spine was performed following the standard protocol without intravenous contrast. Multiplanar CT image reconstructions of the cervical spine were also generated. RADIATION DOSE REDUCTION: This exam was performed according to the departmental dose-optimization program which includes automated exposure control, adjustment of the mA and/or kV according to patient size and/or use of iterative reconstruction technique. COMPARISON:  None Available. FINDINGS: CT HEAD FINDINGS Brain: Age related volume loss. No sign of acute infarction, mass lesion, hemorrhage, hydrocephalus or  extra-axial collection. Small-vessel change of the white matter with old lacunar infarctions in the external capsule regions. Vascular: There is atherosclerotic calcification of the major vessels at the base of the brain. Skull: No significant finding. Sinuses/Orbits: Clear/normal Other: None CT CERVICAL SPINE FINDINGS Alignment: No traumatic malalignment. Skull base and vertebrae: No fracture or focal bone lesion. Chronic fusion at the C2-3 level. Chronic fusion from C4 through C6. Soft tissues and spinal canal: Negative Disc levels: Ordinary degenerative arthritis at the C1-2 articulation. Chronic degenerative spondylosis C3-4 and C6-7. No compressive central canal stenosis. Upper chest: Negative Other: None IMPRESSION: HEAD CT: No acute or traumatic finding. Age related volume loss. Chronic small-vessel ischemic changes of the white matter. CERVICAL SPINE CT: No acute or traumatic finding. Chronic degenerative changes as outlined above. Electronically Signed   By: Oneil Officer M.D.   On: 11/20/2024 15:38   CT Cervical Spine Wo Contrast Result Date: 11/20/2024 CLINICAL DATA:  Clemens today with trauma to the head and neck. EXAM: CT HEAD WITHOUT CONTRAST CT CERVICAL SPINE WITHOUT CONTRAST TECHNIQUE: Multidetector CT imaging of the head and cervical spine was performed following the standard protocol without intravenous contrast. Multiplanar CT image reconstructions of the cervical spine were also generated. RADIATION DOSE REDUCTION: This exam was performed according to the departmental dose-optimization program which includes automated exposure control, adjustment of the mA and/or kV according to patient size and/or use of iterative reconstruction technique. COMPARISON:  None Available. FINDINGS: CT HEAD FINDINGS Brain: Age related volume loss. No sign of acute infarction, mass lesion, hemorrhage, hydrocephalus or extra-axial collection. Small-vessel change of the white matter with old lacunar infarctions in the  external capsule regions. Vascular: There is atherosclerotic calcification of the major vessels at the base of the brain. Skull: No significant finding. Sinuses/Orbits: Clear/normal Other: None CT CERVICAL SPINE FINDINGS Alignment: No traumatic malalignment. Skull base and vertebrae: No fracture or focal bone lesion. Chronic fusion at the C2-3 level. Chronic fusion from C4 through C6. Soft tissues and spinal canal: Negative Disc levels: Ordinary degenerative arthritis at the C1-2 articulation. Chronic degenerative spondylosis C3-4 and C6-7. No compressive central canal stenosis. Upper chest: Negative Other: None IMPRESSION: HEAD CT: No acute or traumatic finding. Age related volume loss. Chronic small-vessel ischemic changes of the white matter. CERVICAL SPINE CT: No acute or traumatic finding. Chronic degenerative changes as outlined above. Electronically Signed   By: Oneil Officer M.D.   On: 11/20/2024 15:38    ECHO as above  Prior 01/2020: NORMAL LEFT VENTRICULAR SYSTOLIC FUNCTION WITH AN ESTIMATED EF = 55 %  NORMAL RIGHT VENTRICULAR SYSTOLIC FUNCTION  MODERATE TRICUSPID AND MITRAL VALVE INSUFFICIENCY  NO VALVULAR STENOSIS  MILD RV ENLARGEMENT  MILD BIATRIAL ENLARGEMENT  MILD LVH   TELEMETRY (personally reviewed): sinus rhythm  rate 80s  EKG (personally reviewed): NSR RBBB rate 80 bpm  Data reviewed by me 11/22/2024: last 24h vitals tele labs imaging I/O ED provider note, admission H&P  Principal Problem:   Syncope Active Problems:   Hyperlipidemia associated with type 2 diabetes mellitus (HCC)   Hypothyroid   BPH (benign prostatic hyperplasia)   Type II diabetes mellitus with renal manifestations (HCC)   Macrocytic anemia   Chronic diastolic CHF (congestive heart failure) (HCC)   Chronic kidney disease, stage 3b (HCC)   Myocardial injury   CAD (coronary artery disease)   Prolonged QT interval   Depression   Fall at home, initial encounter   Closed fracture of great toe of right  foot   Right rib fracture   posible sigmoid diverticulitis    ASSESSMENT AND PLAN:  David Henry is a 89 y.o. male  with a past medical history of coronary artery disease s/p PCI and stent of LAD and LCx 2006,  moderate mitral insufficiency, bradycardia, hypertension, hyperlipidemia, type 2 diabetes who presented to the ED on 11/20/2024 for syncope. Cardiology was consulted for further evaluation.   # Syncope # Coronary artery disease # Demand ischemia Episode of syncope with prodrome of dizziness yesterday. Minimal and flat trops, no EKG changes. Echo this admission with EF 45-50%, severe asymmetric LVH of basal and inferior segments, grade I diastolic dysfunction, mild MR. -Suspect vasovagal syncope. Encouraged PO hydration.  -No events noted on telemetry. Doubt outpatient holter would be beneficial so will defer this.  -Minimal and flat troponin most consistent with demand/supply mismatch and not ACS.  Ok for discharge today from a cardiac perspective. Will arrange for follow up in clinic with Dr., Raina Pierre, PA in 1-2 weeks.    This patient's plan of care was discussed and created with Dr. Florencio and he is in agreement.  Signed: Danita Bloch, PA-C  11/22/2024, 9:39 AM [facility]

## 2024-11-22 NOTE — Plan of Care (Signed)

## 2024-11-23 ENCOUNTER — Telehealth: Payer: Self-pay

## 2024-11-23 NOTE — Patient Instructions (Signed)
 Visit Information  Thank you for taking time to visit with me today. Please don't hesitate to contact me if I can be of assistance to you.  Patient instructions:   Per discharge instructions: Cleanse L elbow skin tear with NS, apply Vaseline gauze ([place] [phone]) to wound bed daily and secure with silicone foam or Telfa and Kerlix roll gauze whichever is preferred/works best.  SOAK DRESSING WITH NORMAL SALINE IF ADHERED TO WOUND BED FOR ATRAUMATIC REMOVAL Discuss with your primary care provider need for physical therapy or occupational therapy Notify provider for any ongoing symptoms of dizziness, lightheadedness, passing out symptoms Call 911 for severe symptoms  Patient verbalizes understanding of instructions and care plan provided today and agrees to view in MyChart. Active MyChart status and patient understanding of how to access instructions and care plan via MyChart confirmed with patient.     The patient has been provided with contact information for the care management team and has been advised to call with any health related questions or concerns.   Please call the care guide team at [phone] if you need to cancel or reschedule your appointment.   Please call the Suicide and Crisis Lifeline: 988 call the [place]  National Suicide Prevention Lifeline: (512) 341-1173 or TTY: 585-216-3500 TTY (619) 013-0106) to talk to a trained counselor call 1-800-273-TALK (toll free, 24 hour hotline) if you are experiencing a Mental Health or Behavioral Health Crisis or need someone to talk to.  Arvin Seip RN, BSN, CCM [employer], Population Health Case Manager Phone: [phone]

## 2024-11-23 NOTE — Transitions of Care (Post Inpatient/ED Visit) (Signed)
 11/23/2024  Name: David Henry MRN: [id number] DOB: 1926-03-25  Today's TOC FU Call Status: Today's TOC FU Call Status:: Successful TOC FU Call Completed TOC FU Call Complete Date: 11/23/24  Patient's Name and Date of Birth confirmed. Name, DOB (patient gave verbal authorization to speak with wife regarding his personal health information.  Wife, [name] on call with patient assisting with health history.)  Transition Care Management Follow-up Telephone Call Date of Discharge: 11/22/24 Discharge Facility: [facility]) Type of Discharge: Inpatient Admission Primary Inpatient Discharge Diagnosis:: syncope How have you been since you were released from the hospital?: Same Any questions or concerns?: No  Items Reviewed: Did you receive and understand the discharge instructions provided?: Yes Medications obtained,verified, and reconciled?: Yes (Medications Reviewed) Any new allergies since your discharge?: No Dietary orders reviewed?: Yes Type of Diet Ordered:: low salt heart healthy Do you have support at home?: Yes People in Home [RPT]: spouse Name of Support/Comfort Primary Source: [name]  Medications Reviewed Today: Medications Reviewed Today     Reviewed by Naol Ontiveros E, RN (Registered Nurse) on 11/23/24 at 1427  Med List Status: <None>   Medication Order Taking? Sig Documenting Provider Last Dose Status Informant  acetaminophen  (TYLENOL ) 650 MG CR tablet [id number] Yes Take 650 mg by mouth every 8 (eight) hours as needed. [provider]  Active Spouse/Significant Other, Pharmacy Records  amoxicillin-clavulanate (AUGMENTIN) 875-125 MG tablet [id number] Yes Take 1 tablet by mouth 2 (two) times daily for 3 days. Caleen Qualia, MD  Active   aspirin  81 MG tablet [id number] Yes Take 81 mg by mouth daily. [provider]  Active Spouse/Significant Other, Pharmacy Records  Continuous Blood Gluc Receiver (FREESTYLE LIBRE  3 READER) [place]VI [id number] Yes 1 each by Does not apply route as directed. Use with sensors to monitor glucose continuously Tower, Laine LABOR, MD  Active Spouse/Significant Other, Pharmacy Records  Continuous Blood Gluc Sensor (FREESTYLE LIBRE 3 SENSOR) [place] [id number] Yes Place 1 sensor on the skin every 14 days. Use to check glucose continuously Tower, Laine LABOR, MD  Active Spouse/Significant Other, Pharmacy Records  FEROSUL 325 [phone] Fe) MG tablet [id number] Yes TAKE ONE TABLET BY MOUTH ONCE A DAY WITH FOOD. Tower, Laine LABOR, MD  Active Spouse/Significant Other, Pharmacy Records  finasteride  (PROSCAR ) 5 MG tablet [id number] Yes Take 1 tablet (5 mg total) by mouth daily. Helon Clotilda LABOR, PA-C  Active Spouse/Significant Other, Pharmacy Records  glimepiride  (AMARYL ) 4 MG tablet [id number] Yes Take 8 mg by mouth daily with breakfast. [provider]  Active Spouse/Significant Other, Pharmacy Records  levothyroxine  (SYNTHROID ) 75 MCG tablet [id number] Yes TAKE ONE TABLET (75 MCG TOTAL) BY MOUTH DAILY BEFORE BREAKFAST. Tower, Laine LABOR, MD  Active Spouse/Significant Other, Pharmacy Records  lidocaine  (LIDODERM ) 5 % [id number] Yes Place 1 patch onto the skin daily. Remove & Discard patch within 12 hours or as directed by MD Caleen Qualia, MD  Active   NOVOLOG  MIX 70/30 FLEXPEN (70-30) 100 UNIT/ML FlexPen [id number] Yes Inject 9 Units into the skin daily with breakfast. [provider]  Active Spouse/Significant Other, Pharmacy Records    Discontinued 06/14/14 1514 (Reorder)   pantoprazole  (PROTONIX ) 40 MG tablet [id number] Yes TAKE ONE TABLET BY MOUTH ONCE A DAY Tower, Laine LABOR, MD  Active Spouse/Significant Other, Pharmacy Records  rosuvastatin  (CRESTOR ) 5 MG tablet [id number] Yes TAKE ONE TABLET BY MOUTH EVERY OTHER DAY Tower, Laine LABOR, MD  Active Spouse/Significant Other, Pharmacy Records  senna (SENOKOT) 8.6 MG tablet  [id number] Yes Take 1 tablet by mouth 2 (two) times daily. [provider]  Active  Spouse/Significant Other, Pharmacy Records  tamsulosin  (FLOMAX ) 0.4 MG CAPS capsule [id number] Yes Take 2 capsules (0.8 mg total) by mouth daily. Helon Clotilda LABOR, PA-C  Active Spouse/Significant Other, Pharmacy Records  torsemide (DEMADEX) 10 MG tablet [id number]  Take 20 mg by mouth daily. [provider]  Active Spouse/Significant Other, Pharmacy Records  VITAMIN D  PO [id number] Yes Take 2,000 Units by mouth daily. [provider]  Active Spouse/Significant Other, Pharmacy Records            Home Care and Equipment/Supplies: Were Home Health Services Ordered?: NA (patient states the doctor didn't feel he needed therapy at this time.) Any new equipment or medical supplies ordered?: Yes Name of Medical supply agency?: patient states he picked up supplies at the hospital pharmacy Were you able to get the equipment/medical supplies?: Yes Do you have any questions related to the use of the equipment/supplies?: No  Functional Questionnaire: Do you need assistance with bathing/showering or dressing?: No Do you need assistance with meal preparation?: No Do you need assistance with eating?: No Do you have difficulty maintaining continence: No Do you need assistance with getting out of bed/getting out of a chair/moving?: No Do you have difficulty managing or taking your medications?: No  Follow up appointments reviewed: PCP Follow-up appointment confirmed?: Yes Date of PCP follow-up appointment?: 11/24/24 Follow-up Provider: Dr. Laine [facility] Follow-up appointment confirmed?: No Reason Specialist Follow-Up Not Confirmed: Patient has Specialist Provider Number and will Call for Appointment (patient advised to see Dr. Ammon in 1 week. Confirmed patient has contact phone number for Dr. Ammon and will call or have daughter, [name] call to arrange appointment) Do you need transportation to your follow-up appointment?: No Do you understand care options if  your condition(s) worsen?: Yes-patient verbalized understanding  SDOH Interventions Today    Flowsheet Row Most Recent Value  SDOH Interventions   Food Insecurity Interventions Intervention Not Indicated  Housing Interventions Intervention Not Indicated  Transportation Interventions Intervention Not Indicated  Utilities Interventions Intervention Not Indicated   Discussed and offered 30 day TOC program.  Patient declined.  The patient has been provided with contact information for the care management team and has been advised to call with any health -related questions or concerns.  The patient verbalized understanding with current plan of care.  The patient is directed to their insurance card regarding availability of benefits coverage.    Arvin Seip RN, BSN, CCM [employer], Population Health Case Manager Phone: [phone]

## 2024-11-24 ENCOUNTER — Inpatient Hospital Stay: Admitting: Family Medicine

## 2024-11-26 LAB — CULTURE, BLOOD (ROUTINE X 2)
Culture: NO GROWTH
Culture: NO GROWTH
Special Requests: ADEQUATE
Special Requests: ADEQUATE

## 2024-12-20 ENCOUNTER — Other Ambulatory Visit: Payer: Self-pay | Admitting: Family Medicine

## 2024-12-20 DIAGNOSIS — E119 Type 2 diabetes mellitus without complications: Secondary | ICD-10-CM

## 2025-01-24 ENCOUNTER — Other Ambulatory Visit: Payer: Self-pay | Admitting: Family Medicine

## 2025-03-20 ENCOUNTER — Encounter: Admitting: Family Medicine

## 2025-03-20 ENCOUNTER — Ambulatory Visit

## 2025-07-27 ENCOUNTER — Ambulatory Visit: Admitting: Urology
